# Patient Record
Sex: Female | Born: 1980 | Race: Black or African American | Hispanic: No | Smoking: Never smoker
Health system: Southern US, Community
[De-identification: ages and names within clinical notes are randomized; demographics above are authoritative.]

## PROBLEM LIST (undated history)

## (undated) DIAGNOSIS — N200 Calculus of kidney: Secondary | ICD-10-CM

## (undated) DIAGNOSIS — Z87442 Personal history of urinary calculi: Secondary | ICD-10-CM

## (undated) DIAGNOSIS — K5732 Diverticulitis of large intestine without perforation or abscess without bleeding: Secondary | ICD-10-CM

## (undated) DIAGNOSIS — I878 Other specified disorders of veins: Secondary | ICD-10-CM

## (undated) DIAGNOSIS — B9681 Helicobacter pylori [H. pylori] as the cause of diseases classified elsewhere: Secondary | ICD-10-CM

## (undated) DIAGNOSIS — M1990 Unspecified osteoarthritis, unspecified site: Secondary | ICD-10-CM

## (undated) DIAGNOSIS — K5790 Diverticulosis of intestine, part unspecified, without perforation or abscess without bleeding: Secondary | ICD-10-CM

## (undated) DIAGNOSIS — K2970 Gastritis, unspecified, without bleeding: Secondary | ICD-10-CM

## (undated) DIAGNOSIS — D509 Iron deficiency anemia, unspecified: Secondary | ICD-10-CM

## (undated) DIAGNOSIS — K219 Gastro-esophageal reflux disease without esophagitis: Secondary | ICD-10-CM

## (undated) HISTORY — DX: Helicobacter pylori (H. pylori) as the cause of diseases classified elsewhere: B96.81

## (undated) HISTORY — PX: SKIN LESION EXCISION: SHX2412

## (undated) HISTORY — PX: KNEE SURGERY: SHX244

## (undated) HISTORY — DX: Diverticulitis of large intestine without perforation or abscess without bleeding: K57.32

## (undated) HISTORY — DX: Other specified disorders of veins: I87.8

## (undated) HISTORY — DX: Gastritis, unspecified, without bleeding: K29.70

---

## 2001-10-13 ENCOUNTER — Emergency Department (HOSPITAL_COMMUNITY): Admission: EM | Admit: 2001-10-13 | Discharge: 2001-10-14 | Payer: Self-pay | Admitting: Emergency Medicine

## 2003-07-08 ENCOUNTER — Emergency Department (HOSPITAL_COMMUNITY): Admission: EM | Admit: 2003-07-08 | Discharge: 2003-07-09 | Payer: Self-pay | Admitting: *Deleted

## 2003-07-12 ENCOUNTER — Encounter (HOSPITAL_COMMUNITY): Admission: RE | Admit: 2003-07-12 | Discharge: 2003-08-11 | Payer: Self-pay | Admitting: Orthopedic Surgery

## 2005-10-03 ENCOUNTER — Emergency Department (HOSPITAL_COMMUNITY): Admission: EM | Admit: 2005-10-03 | Discharge: 2005-10-03 | Payer: Self-pay | Admitting: Emergency Medicine

## 2008-02-26 HISTORY — PX: OTHER SURGICAL HISTORY: SHX169

## 2008-05-10 ENCOUNTER — Inpatient Hospital Stay (HOSPITAL_COMMUNITY): Admission: AD | Admit: 2008-05-10 | Discharge: 2008-05-10 | Payer: Self-pay | Admitting: Family Medicine

## 2008-06-03 ENCOUNTER — Inpatient Hospital Stay (HOSPITAL_COMMUNITY): Admission: AD | Admit: 2008-06-03 | Discharge: 2008-06-03 | Payer: Self-pay | Admitting: Obstetrics and Gynecology

## 2008-06-12 ENCOUNTER — Emergency Department (HOSPITAL_COMMUNITY): Admission: EM | Admit: 2008-06-12 | Discharge: 2008-06-13 | Payer: Self-pay | Admitting: Emergency Medicine

## 2008-06-27 ENCOUNTER — Ambulatory Visit (HOSPITAL_COMMUNITY): Admission: RE | Admit: 2008-06-27 | Discharge: 2008-06-27 | Payer: Self-pay | Admitting: Orthopedic Surgery

## 2010-06-06 LAB — URINALYSIS, ROUTINE W REFLEX MICROSCOPIC
Bilirubin Urine: NEGATIVE
Glucose, UA: NEGATIVE mg/dL
Ketones, ur: 15 mg/dL — AB
Leukocytes, UA: NEGATIVE
Nitrite: NEGATIVE
Protein, ur: NEGATIVE mg/dL
Urobilinogen, UA: 0.2 mg/dL (ref 0.0–1.0)
pH: 5.5 (ref 5.0–8.0)
pH: 5 (ref 5.0–8.0)

## 2010-06-06 LAB — DIFFERENTIAL
Basophils Absolute: 0 10*3/uL (ref 0.0–0.1)
Basophils Relative: 1 % (ref 0–1)
Eosinophils Relative: 1 % (ref 0–5)
Lymphocytes Relative: 43 % (ref 12–46)
Lymphocytes Relative: 40 % (ref 12–46)
Monocytes Absolute: 0.9 10*3/uL (ref 0.1–1.0)
Monocytes Relative: 9 % (ref 3–12)
Neutro Abs: 4.2 10*3/uL (ref 1.7–7.7)
Neutro Abs: 4 10*3/uL (ref 1.7–7.7)
Neutrophils Relative %: 46 % (ref 43–77)
Neutrophils Relative %: 50 % (ref 43–77)

## 2010-06-06 LAB — CBC
HCT: 42.1 % (ref 36.0–46.0)
HCT: 44.8 % (ref 36.0–46.0)
HCT: 41.9 % (ref 36.0–46.0)
Hemoglobin: 14.4 g/dL (ref 12.0–15.0)
Hemoglobin: 14.2 g/dL (ref 12.0–15.0)
MCHC: 34.2 g/dL (ref 30.0–36.0)
Platelets: 186 10*3/uL (ref 150–400)
Platelets: 211 10*3/uL (ref 150–400)
RBC: 4.68 MIL/uL (ref 3.87–5.11)
RBC: 4.64 MIL/uL (ref 3.87–5.11)
RDW: 14.8 % (ref 11.5–15.5)
RDW: 14.9 % (ref 11.5–15.5)
WBC: 5.9 10*3/uL (ref 4.0–10.5)
WBC: 9.2 10*3/uL (ref 4.0–10.5)

## 2010-06-06 LAB — URINE MICROSCOPIC-ADD ON

## 2010-06-06 LAB — COMPREHENSIVE METABOLIC PANEL
Alkaline Phosphatase: 64 U/L (ref 39–117)
CO2: 25 mEq/L (ref 19–32)
Calcium: 9.2 mg/dL (ref 8.4–10.5)
Chloride: 106 mEq/L (ref 96–112)
Creatinine, Ser: 0.9 mg/dL (ref 0.4–1.2)
GFR calc non Af Amer: >60 mL/min (ref >60)
Potassium: 3.8 mEq/L (ref 3.5–5.1)
Total Bilirubin: 0.6 mg/dL (ref 0.3–1.2)
Total Protein: 7.2 g/dL (ref 6.0–8.3)

## 2010-06-06 LAB — POCT PREGNANCY, URINE: Preg Test, Ur: NEGATIVE

## 2010-06-06 LAB — GC/CHLAMYDIA PROBE AMP, GENITAL: GC Probe Amp, Genital: NEGATIVE

## 2010-06-06 LAB — POCT CARDIAC MARKERS: CKMB, poc: <1 ng/mL — ABNORMAL LOW (ref 1.0–8.0)

## 2010-06-06 LAB — WET PREP, GENITAL

## 2010-06-07 LAB — URINALYSIS, ROUTINE W REFLEX MICROSCOPIC
Glucose, UA: NEGATIVE mg/dL
Nitrite: NEGATIVE
Specific Gravity, Urine: 1.025 (ref 1.005–1.030)
Urobilinogen, UA: 0.2 mg/dL (ref 0.0–1.0)

## 2010-06-07 LAB — URINE MICROSCOPIC-ADD ON

## 2010-06-07 LAB — POCT PREGNANCY, URINE: Preg Test, Ur: NEGATIVE

## 2010-07-10 NOTE — Op Note (Signed)
Marilyn Rivera, Marilyn Rivera               ACCOUNT NO.:  [id number]   MEDICAL RECORD NO.:  [id number]          PATIENT TYPE:  AMB   LOCATION:  SDS                          FACILITY:  [facility]   PHYSICIAN:  Harvie Junior, M.D.   DATE OF BIRTH:  01-Jun-1980   DATE OF PROCEDURE:  06/27/2008  DATE OF DISCHARGE:  06/27/2008                               OPERATIVE REPORT   PREOPERATIVE DIAGNOSIS:  Lateral meniscal tear.   POSTOPERATIVE DIAGNOSES:  1. Lateral meniscal tear.  2. 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   SURGEON:  Harvie Junior, MD   ANESTHESIA:  General.   BRIEF HISTORY:  Marilyn Rivera is a young woman with a long history of  having had severe knee pain.  We treated her conservatively for period  of time.  Because of continued complaints of pain, she was ultimately  evaluated and felt to have lateral meniscal tear.  We talked about  treatment options and ultimately felt that operative intervention and  most appropriate course of action.  She was brought to the operating  room for this procedure.   PROCEDURE:  The patient was brought to the operating room.  After  adequate anesthesia was obtained with general anesthetic, the patient  was placed supine on the operating table.  The leg was then prepped and  draped in usual sterile fashion.  Following this, routine arthroscopic  examination of the knee revealed there was an obvious and complex  lateral meniscal tear.  This is debrided back to smooth and stable rim.  The lateral femoral condyle showed some grade 3 and grade 4 changes,  which was debrided.  Attention was turned up to the patellofemoral  joint, which had significant chondromalacia, which was debrided.  The  ACL was normal, medial side normal.  The knee was copiously and  thoroughly irrigated with a normal saline irrigation and suctioned dry.  The arthroscopic portals were closed with bandage.  Sterile compression  dressing was  applied.  The patient was taken to the recovery room and  was noted to be in satisfactory condition.  Estimated blood loss for  this procedure was none.      Harvie Junior, M.D.  Electronically Signed     Harvie Junior, M.D.  Electronically Signed    JLG/MEDQ  D:  07/20/2008  T:  07/21/2008  Job:  [id number]

## 2012-10-07 ENCOUNTER — Emergency Department (HOSPITAL_COMMUNITY): Payer: Self-pay

## 2012-10-07 ENCOUNTER — Emergency Department (HOSPITAL_COMMUNITY)
Admission: EM | Admit: 2012-10-07 | Discharge: 2012-10-07 | Disposition: A | Discharge status: Home / Self Care | Payer: Self-pay | Attending: Emergency Medicine | Admitting: Emergency Medicine

## 2012-10-07 ENCOUNTER — Encounter (HOSPITAL_COMMUNITY): Payer: Self-pay

## 2012-10-07 DIAGNOSIS — R5381 Other malaise: Secondary | ICD-10-CM | POA: Insufficient documentation

## 2012-10-07 DIAGNOSIS — R072 Precordial pain: Secondary | ICD-10-CM | POA: Insufficient documentation

## 2012-10-07 DIAGNOSIS — R002 Palpitations: Secondary | ICD-10-CM | POA: Insufficient documentation

## 2012-10-07 DIAGNOSIS — R110 Nausea: Secondary | ICD-10-CM | POA: Insufficient documentation

## 2012-10-07 DIAGNOSIS — R0789 Other chest pain: Secondary | ICD-10-CM

## 2012-10-07 LAB — CBC WITH DIFFERENTIAL/PLATELET
Basophils Absolute: 0 10*3/uL (ref 0.0–0.1)
Basophils Relative: 1 % (ref 0–1)
Eosinophils Absolute: 0.2 10*3/uL (ref 0.0–0.7)
Eosinophils Relative: 2 % (ref 0–5)
HCT: 41.5 % (ref 36.0–46.0)
MCH: 30.4 pg (ref 26.0–34.0)
MCHC: 33.7 g/dL (ref 30.0–36.0)
MCV: 90.2 fL (ref 78.0–100.0)
Monocytes Absolute: 0.6 10*3/uL (ref 0.1–1.0)
Platelets: 206 10*3/uL (ref 150–400)
RDW: 14.2 % (ref 11.5–15.5)

## 2012-10-07 LAB — BASIC METABOLIC PANEL
BUN: 10 mg/dL (ref 6–23)
CO2: 27 mEq/L (ref 19–32)
Calcium: 9.1 mg/dL (ref 8.4–10.5)
Chloride: 103 mEq/L (ref 96–112)
Creatinine, Ser: 0.78 mg/dL (ref 0.50–1.10)

## 2012-10-07 LAB — TROPONIN I: Troponin I: <0.3 ng/mL (ref <0.30)

## 2012-10-07 NOTE — ED Provider Notes (Signed)
CSN: [id number]     Arrival date & time 10/07/12  1221 History     First MD Initiated Contact with Patient 10/07/12 1259     Chief Complaint  Patient presents with  . Chest Pain  . Fatigue   (Consider location/radiation/quality/duration/timing/severity/associated sxs/prior Treatment) Patient is a 32 y.o. female presenting with chest pain. The history is provided by the patient. No language interpreter was used.  Chest Pain Pain location:  Substernal area Pain quality: pressure and sharp   Pain radiates to:  L arm Pain radiates to the back: no   Pain severity:  Moderate Onset quality:  Sudden Duration:  3 minutes Timing:  Intermittent Progression:  Unchanged Chronicity:  New Context: at rest   Context: not breathing, no drug use, no movement and not raising an arm   Relieved by:  Nothing Worsened by:  Nothing tried Ineffective treatments:  None tried Associated symptoms: fatigue and nausea   Associated symptoms: no abdominal pain, no anxiety, no back pain, no cough, no diaphoresis, no fever, no headache, no lower extremity edema, no numbness, no palpitations, no shortness of breath, no syncope, not vomiting and no weakness   Associated symptoms comment:  Palpitations Risk factors: obesity   Risk factors: no birth control, no coronary artery disease, no diabetes mellitus, no high cholesterol, no prior DVT/PE and no smoking     History reviewed. No pertinent past medical history. Past Surgical History  Procedure Laterality Date  . Knee surgery     No family history on file. History  Substance Use Topics  . Smoking status: Never Smoker   . Smokeless tobacco: Not on file  . Alcohol Use: No   OB History   Grav Para Term Preterm Abortions TAB SAB Ect Mult Living                 Review of Systems  Constitutional: Positive for fatigue. Negative for fever, chills, diaphoresis, activity change and appetite change.  HENT: Negative for congestion, sore throat, facial  swelling, rhinorrhea, neck pain and neck stiffness.   Eyes: Negative for photophobia and discharge.  Respiratory: Negative for cough, chest tightness and shortness of breath.   Cardiovascular: Positive for chest pain. Negative for palpitations, leg swelling and syncope.  Gastrointestinal: Positive for nausea. Negative for vomiting, abdominal pain and diarrhea.  Endocrine: Negative for polydipsia and polyuria.  Genitourinary: Negative for dysuria, frequency, difficulty urinating and pelvic pain.  Musculoskeletal: Negative for back pain and arthralgias.  Skin: Negative for color change and wound.  Allergic/Immunologic: Negative for immunocompromised state.  Neurological: Negative for facial asymmetry, weakness, numbness and headaches.  Hematological: Does not bruise/bleed easily.  Psychiatric/Behavioral: Negative for confusion and agitation.    Allergies  Review of patient's allergies indicates no known allergies.  Home Medications   Current Outpatient Rx  Name  Route  Sig  Dispense  Refill  . ibuprofen (ADVIL,MOTRIN) 200 MG tablet   Oral   Take 800 mg by mouth every 6 (six) hours as needed for pain.          BP 112/72  Pulse 68  Temp(Src) 98.3 F (36.8 C) (Oral)  Resp 19  Ht 5' 9.5" (1.765 m)  Wt 333 lb (151.048 kg)  BMI 48.49 kg/m2  SpO2 97%  LMP 10/01/2012 Physical Exam  Constitutional: She is oriented to person, place, and time. She appears well-developed and well-nourished. No distress.  Morbid obesity  HENT:  Head: Normocephalic and atraumatic.  Mouth/Throat: No oropharyngeal exudate.  Eyes: Pupils  are equal, round, and reactive to light.  Neck: Normal range of motion. Neck supple.  Cardiovascular: Normal rate, regular rhythm and normal heart sounds.  Exam reveals no gallop and no friction rub.   No murmur heard. Pulmonary/Chest: Effort normal and breath sounds normal. No respiratory distress. She has no wheezes. She has no rales.  Abdominal: Soft. Bowel sounds  are normal. She exhibits no distension and no mass. There is no tenderness. There is no rebound and no guarding.  Musculoskeletal: Normal range of motion. She exhibits no edema and no tenderness.  Neurological: She is alert and oriented to person, place, and time.  Skin: Skin is warm and dry.  Three small, superficial nodules, one under L sided skin fold and posterior neck w/o overlying erythema, induration, one under R sided skin fold with small smt of drainage, but no underlying fluctuance or induration.  Acanthosis nigrans  Psychiatric: She has a normal mood and affect.    ED Course   Procedures (including critical care time)  Labs Reviewed  BASIC METABOLIC PANEL - Abnormal; Notable for the following:    Glucose, Bld 102 (*)    All other components within normal limits  CBC WITH DIFFERENTIAL - Abnormal; Notable for the following:    Neutrophils Relative % 39 (*)    Lymphocytes Relative 50 (*)    All other components within normal limits  TROPONIN I  PREGNANCY, URINE  D-DIMER, QUANTITATIVE   Dg Chest 2 View  10/07/2012   *RADIOLOGY REPORT*  Clinical Data: Chest pain  CHEST - 2 VIEW  Comparison: June 12, 2008  Findings: Lungs clear.  Heart size and pulmonary vascularity are normal.  No pneumothorax.  No adenopathy.  No bone lesions.  IMPRESSION: No abnormality noted.   Original Report Authenticated By: Bretta Bang, M.D.   1. Atypical chest pain      Date: 10/07/2012  Rate: 76  Rhythm: normal sinus rhythm  QRS Axis: normal  Intervals: normal  ST/T Wave abnormalities: normal  Conduction Disutrbances:none  Narrative Interpretation:   Old EKG Reviewed: none available    MDM  Pt is a 32 y.o. female with Pmhx as above who presents with 2-3 weeks of intermittent mid sternal CP occuring at rest, lasting 2-3 mins.  No  Pt well appearing on PE, in NAD, PERC negative.  History is atypical for ischemia and no ST changes on EKG.     CXR unremarkable, trop negative, d-dimer not  elevated.  Hb nml, Cr stable.  I doubt cardiac cause of chest pain as well as PE, pna, ptx.  HEART score of 1 (low risk of adverse cardiac events).  Will ask pt to f/u as outpt with local PCP, but have also given return precautions for new or worsening symptoms such as more persistent pain, SOB, leg swelling. I do not believe any skin lesions noted above require I&D.    1. Atypical chest pain        Shanna Cisco, MD 10/07/12 1556

## 2012-10-07 NOTE — ED Notes (Signed)
Pt c/o feeling fatigued, chest pain since Saturday.  Reports Sunday had episode where she became diaphoretic, nauseated, and had left side pain.  Marilyn Rivera  Also reports a knot behind her naval.  Pt says has had multiple "boils."

## 2012-10-08 ENCOUNTER — Emergency Department (HOSPITAL_COMMUNITY)
Admission: EM | Admit: 2012-10-08 | Discharge: 2012-10-09 | Disposition: A | Discharge status: Home / Self Care | Payer: Self-pay | Attending: Emergency Medicine | Admitting: Emergency Medicine

## 2012-10-08 ENCOUNTER — Encounter (HOSPITAL_COMMUNITY): Payer: Self-pay

## 2012-10-08 DIAGNOSIS — R110 Nausea: Secondary | ICD-10-CM | POA: Insufficient documentation

## 2012-10-08 DIAGNOSIS — Z3202 Encounter for pregnancy test, result negative: Secondary | ICD-10-CM | POA: Insufficient documentation

## 2012-10-08 DIAGNOSIS — N390 Urinary tract infection, site not specified: Secondary | ICD-10-CM | POA: Insufficient documentation

## 2012-10-08 DIAGNOSIS — R109 Unspecified abdominal pain: Secondary | ICD-10-CM | POA: Insufficient documentation

## 2012-10-08 LAB — COMPREHENSIVE METABOLIC PANEL
AST: 18 U/L (ref 0–37)
CO2: 26 mEq/L (ref 19–32)
Calcium: 9.4 mg/dL (ref 8.4–10.5)
Creatinine, Ser: 0.93 mg/dL (ref 0.50–1.10)
GFR calc Af Amer: >90 mL/min (ref >90)
GFR calc non Af Amer: 80 mL/min — ABNORMAL LOW (ref >90)
Total Protein: 7 g/dL (ref 6.0–8.3)

## 2012-10-08 LAB — CBC WITH DIFFERENTIAL/PLATELET
Basophils Absolute: 0 10*3/uL (ref 0.0–0.1)
Eosinophils Absolute: 0.2 10*3/uL (ref 0.0–0.7)
Eosinophils Relative: 2 % (ref 0–5)
HCT: 42.2 % (ref 36.0–46.0)
Lymphocytes Relative: 51 % — ABNORMAL HIGH (ref 12–46)
MCH: 29.7 pg (ref 26.0–34.0)
MCHC: 32.9 g/dL (ref 30.0–36.0)
MCV: 90.2 fL (ref 78.0–100.0)
Monocytes Absolute: 0.7 10*3/uL (ref 0.1–1.0)
RDW: 14.3 % (ref 11.5–15.5)
WBC: 8.7 10*3/uL (ref 4.0–10.5)

## 2012-10-08 MED ORDER — ONDANSETRON 8 MG PO TBDP
8.0000 mg | ORAL_TABLET | Freq: Once | ORAL | Status: AC
  Administered 2012-10-08: 8 mg via ORAL
  Filled 2012-10-08: qty 1

## 2012-10-08 MED ORDER — ONDANSETRON HCL 4 MG/2ML IJ SOLN
4.0000 mg | Freq: Once | INTRAMUSCULAR | Status: AC
  Administered 2012-10-08: 4 mg via INTRAVENOUS
  Filled 2012-10-08: qty 2

## 2012-10-08 MED ORDER — MORPHINE SULFATE 4 MG/ML IJ SOLN
4.0000 mg | Freq: Once | INTRAMUSCULAR | Status: AC
  Administered 2012-10-08: 4 mg via INTRAVENOUS
  Filled 2012-10-08: qty 1

## 2012-10-08 NOTE — ED Notes (Signed)
Seen here yesterday and dx'd with umbilical hernia. Pain worse today with mild nausea. No vomiting or diarrhea. Last BM this morning

## 2012-10-08 NOTE — ED Notes (Signed)
Patient ambulatory to restroom with steady gait, with tech and husband, clean catch instructions given and advised pt to bring specimen back to room as well.

## 2012-10-09 ENCOUNTER — Emergency Department (HOSPITAL_COMMUNITY): Payer: Self-pay

## 2012-10-09 LAB — URINALYSIS, ROUTINE W REFLEX MICROSCOPIC
Nitrite: NEGATIVE
Specific Gravity, Urine: >1.03 — ABNORMAL HIGH (ref 1.005–1.030)
Urobilinogen, UA: 0.2 mg/dL (ref 0.0–1.0)

## 2012-10-09 LAB — LIPASE, BLOOD: Lipase: 27 U/L (ref 11–59)

## 2012-10-09 LAB — POCT PREGNANCY, URINE: Preg Test, Ur: NEGATIVE

## 2012-10-09 MED ORDER — CEPHALEXIN 500 MG PO CAPS: 500.0000 mg | ORAL_CAPSULE | Freq: Four times a day (QID) | ORAL | Status: DC

## 2012-10-09 NOTE — ED Notes (Signed)
Patient given discharge instruction, verbalized understand. IV removed, band aid applied. Patient ambulatory out of the department with family 

## 2012-10-09 NOTE — ED Provider Notes (Signed)
CSN: [id number]     Arrival date & time 10/08/12  2140 History     First MD Initiated Contact with Patient 10/08/12 2224     Chief Complaint  Patient presents with  . Abdominal Pain   (Consider location/radiation/quality/duration/timing/severity/associated sxs/prior Treatment) HPI Comments: KATIYA FIKE is [name] presenting with a 3 day history of abdominal pain which has been waxing and waning and sharp in character which worsened tonight.  She was seen here yesterday for similar complaint which also included midsternal chest pain which has not been present today and had a negative cardiac workup at yesterdays visit, but it was suggested she may have an umbilical hernia as she notices an occasional "knot" at her umbilicus.  She denies fever, chills and vomiting but has had some intermittent nausea.  Her last bm was yesterday and normal.  She denies urinary pain or increased frequency and denies vaginal discharge or complaint.  She is married with no risk factors for stds. She hast taken ibuprofen without relief of pain.  Food does not effect her pain, her last meal was 3 hours before arrival.    The history is provided by the patient.    History reviewed. No pertinent past medical history. Past Surgical History  Procedure Laterality Date  . Knee surgery    . Renal calculi removal Left 2010   No family history on file. History  Substance Use Topics  . Smoking status: Never Smoker   . Smokeless tobacco: Not on file  . Alcohol Use: No   OB History   Grav Para Term Preterm Abortions TAB SAB Ect Mult Living                 Review of Systems  Constitutional: Negative for fever, chills and appetite change.  HENT: Negative for congestion, sore throat and neck pain.   Eyes: Negative.   Respiratory: Negative for chest tightness and shortness of breath.   Cardiovascular: Negative for chest pain.  Gastrointestinal: Positive for nausea and abdominal pain. Negative for  vomiting, diarrhea and constipation.  Genitourinary: Negative.   Musculoskeletal: Negative for joint swelling and arthralgias.  Skin: Negative.  Negative for rash and wound.  Neurological: Negative for dizziness, weakness, light-headedness, numbness and headaches.  Psychiatric/Behavioral: Negative.     Allergies  Review of patient's allergies indicates no known allergies.  Home Medications   Current Outpatient Rx  Name  Route  Sig  Dispense  Refill  . ibuprofen (ADVIL,MOTRIN) 800 MG tablet   Oral   Take 800 mg by mouth every 8 (eight) hours as needed for pain.         . cephALEXin (KEFLEX) 500 MG capsule   Oral   Take 1 capsule (500 mg total) by mouth 4 (four) times daily.   28 capsule   0    BP 127/55  Pulse 71  Temp(Src) 98.7 F (37.1 C) (Oral)  Resp 18  Ht 5\' 9"  (1.753 m)  Wt 333 lb (151.048 kg)  BMI 49.15 kg/m2  SpO2 94%  LMP 10/01/2012 Physical Exam  Nursing note and vitals reviewed. Constitutional: She appears well-developed.  Morbid obesity  HENT:  Head: Normocephalic and atraumatic.  Mouth/Throat: Oropharynx is clear and moist.  Eyes: Conjunctivae are normal.  Neck: Normal range of motion.  Cardiovascular: Normal rate, regular rhythm, normal heart sounds and intact distal pulses.   Pulmonary/Chest: Effort normal and breath sounds normal. She has no wheezes.  Abdominal: Soft. Bowel sounds are normal. There  is no hepatosplenomegaly. There is tenderness in the right lower quadrant and epigastric area. There is no rebound, no guarding, no CVA tenderness and negative Murphy's sign.  Abdominal exam is limited by body habitus.  Musculoskeletal: Normal range of motion.  Neurological: She is alert.  Skin: Skin is warm and dry.  Psychiatric: She has a normal mood and affect.    ED Course   Procedures (including critical care time)  Labs Reviewed  CBC WITH DIFFERENTIAL - Abnormal; Notable for the following:    Neutrophils Relative % 39 (*)    Lymphocytes  Relative 51 (*)    Lymphs Abs 4.4 (*)    All other components within normal limits  COMPREHENSIVE METABOLIC PANEL - Abnormal; Notable for the following:    Glucose, Bld 104 (*)    Albumin 3.4 (*)    Total Bilirubin 0.2 (*)    GFR calc non Af Amer 80 (*)    All other components within normal limits  URINALYSIS, ROUTINE W REFLEX MICROSCOPIC - Abnormal; Notable for the following:    APPearance HAZY (*)    Specific Gravity, Urine >1.030 (*)    Hgb urine dipstick MODERATE (*)    All other components within normal limits  URINE MICROSCOPIC-ADD ON - Abnormal; Notable for the following:    Squamous Epithelial / LPF FEW (*)    Bacteria, UA MANY (*)    All other components within normal limits  URINE CULTURE  LIPASE, BLOOD  POCT PREGNANCY, URINE   Ct Abdomen Pelvis Wo Contrast  10/09/2012   *RADIOLOGY REPORT*  Clinical Data: Periumbilical pain and right lower quadrant pain.  CT ABDOMEN AND PELVIS WITHOUT CONTRAST  Technique:  Multidetector CT imaging of the abdomen and pelvis was performed following the standard protocol without intravenous contrast.  Comparison: CT of the abdomen and pelvis 10/18/2009.  Findings:  Lung Bases: Minimal dependent atelectasis in the lower lobes of the lungs bilaterally.  Abdomen/Pelvis:  There are no abnormal calcifications within the collecting system of either kidney, along the course of either ureter, or within the lumen of the urinary bladder.  No hydroureteronephrosis or perinephric stranding to suggest urinary tract obstruction at this time.  The unenhanced appearance of the kidneys is unremarkable bilaterally.  Diffuse low attenuation throughout the hepatic parenchyma, compatible with hepatic steatosis.  No focal hepatic lesions are noted on today's noncontrast CT examination.  The unenhanced appearance of the gallbladder, pancreas, spleen and bilateral adrenal glands is unremarkable.  No significant volume of ascites.  No pneumoperitoneum.  No pathologic distension  of small bowel. Numerous colonic diverticula are noted, without surrounding inflammatory changes to suggest acute diverticulitis at this time. Normal appendix.  The uterus and ovaries are unremarkable in appearance.  Urinary bladder is normal in appearance.  Musculoskeletal: There are no aggressive appearing lytic or blastic lesions noted in the visualized portions of the skeleton.  IMPRESSION: 1.  No acute findings in the abdomen or pelvis to account for the patient's symptoms. 2.  Specifically, the appendix is normal. 3.  Mild colonic diverticulosis without findings to suggest acute diverticulitis at this time. 4.  Hepatic steatosis.   Original Report Authenticated By: Trudie Reed, M.D.   Dg Chest 2 View  10/07/2012   *RADIOLOGY REPORT*  Clinical Data: Chest pain  CHEST - 2 VIEW  Comparison: June 12, 2008  Findings: Lungs clear.  Heart size and pulmonary vascularity are normal.  No pneumothorax.  No adenopathy.  No bone lesions.  IMPRESSION: No abnormality noted.  Original Report Authenticated By: Bretta Bang, M.D.   1. Abdominal pain   2. UTI (lower urinary tract infection)     MDM  Abdominal pain of unclear etiology, but lab findings suggestive of uti.  Pt was placed on keflex,  Encouraged increased fluid intake.  Urine cx pending.  Referrals given for establishing pcp, advised return here for any worsened sx.  The patient appears reasonably screened and/or stabilized for discharge and I doubt any other medical condition or other [facility] requiring further screening, evaluation, or treatment in the ED at this time prior to discharge.  Patients labs and/or radiological studies were viewed and considered during the medical decision making and disposition process.   Burgess Amor, PA-C 10/09/12 0221

## 2012-10-09 NOTE — ED Provider Notes (Signed)
Medical screening examination/treatment/procedure(s) were performed by non-physician practitioner and as supervising physician I was immediately available for consultation/collaboration.   Dione Booze, MD 10/09/12 [phone]

## 2012-10-10 LAB — URINE CULTURE

## 2013-03-15 ENCOUNTER — Encounter (HOSPITAL_BASED_OUTPATIENT_CLINIC_OR_DEPARTMENT_OTHER): Payer: Self-pay | Admitting: Emergency Medicine

## 2013-03-15 ENCOUNTER — Emergency Department (HOSPITAL_BASED_OUTPATIENT_CLINIC_OR_DEPARTMENT_OTHER)
Admission: EM | Admit: 2013-03-15 | Discharge: 2013-03-15 | Disposition: A | Discharge status: Home / Self Care | Payer: Self-pay | Attending: Emergency Medicine | Admitting: Emergency Medicine

## 2013-03-15 DIAGNOSIS — N938 Other specified abnormal uterine and vaginal bleeding: Secondary | ICD-10-CM | POA: Insufficient documentation

## 2013-03-15 DIAGNOSIS — B3789 Other sites of candidiasis: Secondary | ICD-10-CM | POA: Insufficient documentation

## 2013-03-15 DIAGNOSIS — N949 Unspecified condition associated with female genital organs and menstrual cycle: Secondary | ICD-10-CM | POA: Insufficient documentation

## 2013-03-15 DIAGNOSIS — Z792 Long term (current) use of antibiotics: Secondary | ICD-10-CM | POA: Insufficient documentation

## 2013-03-15 DIAGNOSIS — B372 Candidiasis of skin and nail: Secondary | ICD-10-CM

## 2013-03-15 MED ORDER — NYSTATIN 100000 UNIT/GM EX POWD: 1.0000 g | Freq: Three times a day (TID) | CUTANEOUS | Status: DC

## 2013-03-15 NOTE — Discharge Instructions (Signed)
Cutaneous Candidiasis Cutaneous candidiasis is a condition in which there is an overgrowth of yeast (candida) on the skin. Yeast normally live on the skin, but in small enough numbers not to cause any symptoms. In certain cases, increased growth of the yeast may cause an actual yeast infection. This kind of infection usually occurs in areas of the skin that are constantly warm and moist, such as the armpits or the groin. Yeast is the most common cause of diaper rash in babies and in people who cannot control their bowel movements (incontinence). CAUSES  The fungus that most often causes cutaneous candidiasis is Candida albicans. Conditions that can increase the risk of getting a yeast infection of the skin include:  Obesity.  Pregnancy.  Diabetes.  Taking antibiotic medicine.  Taking birth control pills.  Taking steroid medicines.  Thyroid disease.  An iron or zinc deficiency.  Problems with the immune system. SYMPTOMS   Red, swollen area of the skin.  Bumps on the skin.  Itchiness. DIAGNOSIS  The diagnosis of cutaneous candidiasis is usually based on its appearance. Light scrapings of the skin may also be taken and viewed under a microscope to identify the presence of yeast. TREATMENT  Antifungal creams may be applied to the infected skin. In severe cases, oral medicines may be needed.  HOME CARE INSTRUCTIONS   Keep your skin clean and dry.  Maintain a healthy weight.  If you have diabetes, keep your blood sugar under control. SEEK IMMEDIATE MEDICAL CARE IF:  Your rash continues to spread despite treatment.  You have a fever, chills, or abdominal pain. Document Released: 10/30/2010 Document Revised: 05/06/2011 Document Reviewed: 10/30/2010 [employer] Patient Information 2014 [employer].  

## 2013-03-15 NOTE — ED Provider Notes (Signed)
CSN: [id number]     Arrival date & time 03/15/13  1432 History  This chart was scribed for Neta Ehlers, MD by Zettie Pho, ED Scribe. This patient was seen in room MH12/MH12 and the patient's care was started at 3:54 PM.    Chief Complaint  Patient presents with  . Rash   Patient is a 33 y.o. female presenting with rash. The history is provided by the patient. No language interpreter was used.  Rash Location:  Torso and ano-genital Torso rash location:  Abd LUQ, abd LLQ, abd RUQ, abd RLQ, lower back, L chest and R chest Ano-genital rash location:  Groin, vagina, L buttock and R buttock Quality: burning, draining and itchiness   Severity:  Moderate Onset quality:  Gradual Duration:  2 months Timing:  Constant Progression:  Worsening Chronicity:  New Context: exposure to similar rash (possibly)   Context: not new detergent/soap   Ineffective treatments:  Anti-itch cream Associated symptoms: no abdominal pain, no diarrhea, no fatigue, no fever, no headaches, no joint pain, no nausea, no shortness of breath, no sore throat and not vomiting    HPI Comments: KEEYA DYCKMAN is a 33 y.o. female who presents to the Emergency Department complaining of an itching, burning rash diffusely over her abdomen, chest (just beneath and around the breasts), lower back/buttocks, groin, and vagina onset 1.5-2 months ago. She states the rash began on the right-side of her abdomen and spread to the other areas. Patient states that the rash appears worse around her groin/vaginal area, which has been draining clear fluid. She reports applying OTC ointment and anti-itch cream to the areas with temporary relief, but that the rash has been spreading and progressively worsening. She states this type of rash is new for her. She states that she has been exposed to individuals with rashes, but that they do not appear similar. She denies any changes in at-home products. Patient states that she does not currently have a  PCP. Patient has no other pertinent medical history.   Patient also reports that her current menstrual period has been much heavier than usual and has been almost constant for the past 2 months, with only about 2 days between bleeding.   History reviewed. No pertinent past medical history. Past Surgical History  Procedure Laterality Date  . Knee surgery    . Renal calculi removal Left 2010   No family history on file. History  Substance Use Topics  . Smoking status: Never Smoker   . Smokeless tobacco: Not on file  . Alcohol Use: No   OB History   Grav Para Term Preterm Abortions TAB SAB Ect Mult Living                 Review of Systems  Constitutional: Negative for fever, chills, diaphoresis, activity change, appetite change and fatigue.  HENT: Negative for congestion, facial swelling, rhinorrhea and sore throat.   Eyes: Negative for photophobia and discharge.  Respiratory: Negative for cough, chest tightness and shortness of breath.   Cardiovascular: Negative for chest pain, palpitations and leg swelling.  Gastrointestinal: Negative for nausea, vomiting, abdominal pain and diarrhea.  Endocrine: Negative for polydipsia and polyuria.  Genitourinary: Positive for menstrual problem. Negative for dysuria, frequency, difficulty urinating and pelvic pain.  Musculoskeletal: Negative for arthralgias, back pain, neck pain and neck stiffness.  Skin: Positive for rash. Negative for color change and wound.  Allergic/Immunologic: Negative for immunocompromised state.  Neurological: Negative for facial asymmetry, weakness, numbness and headaches.  Hematological: Does not bruise/bleed easily.  Psychiatric/Behavioral: Negative for confusion and agitation.    Allergies  Review of patient's allergies indicates no known allergies.  Home Medications   Current Outpatient Rx  Name  Route  Sig  Dispense  Refill  . cephALEXin (KEFLEX) 500 MG capsule   Oral   Take 1 capsule (500 mg total) by  mouth 4 (four) times daily.   28 capsule   0   . ibuprofen (ADVIL,MOTRIN) 800 MG tablet   Oral   Take 800 mg by mouth every 8 (eight) hours as needed for pain.         Marland Kitchen nystatin (MYCOSTATIN/NYSTOP) 100000 UNIT/GM POWD   Topical   Apply 1 g topically 3 (three) times daily. apply to candidal lesions TOPICALLY 2 to 3 times daily until healing complete   60 g   0    Triage Vitals: BP 155/81  Pulse 77  Temp(Src) 98.1 F (36.7 C) (Oral)  Resp 20  Ht 5' 9.5" (1.765 m)  Wt 333 lb (151.048 kg)  BMI 48.49 kg/m2  SpO2 100%  LMP 02/12/2013  Physical Exam  Nursing note and vitals reviewed. Constitutional: She is oriented to person, place, and time. She appears well-developed and well-nourished. No distress.  HENT:  Head: Normocephalic.  Mouth/Throat: Oropharynx is clear and moist.  Eyes: Pupils are equal, round, and reactive to light.  Neck: Neck supple.  Cardiovascular: Normal rate, regular rhythm and normal heart sounds.   Pulmonary/Chest: Effort normal and breath sounds normal. No respiratory distress. She has no wheezes.  Abdominal: Soft. She exhibits no distension. There is no tenderness. There is no rebound and no guarding.  Musculoskeletal: She exhibits no edema and no tenderness.  Neurological: She is alert and oriented to person, place, and time.  Skin: Skin is warm and dry. Rash noted.  Erythema and dermal thinning in skin folds of panis, groin, breasts with a clear exudate on groin.   Psychiatric: She has a normal mood and affect.    ED Course  Procedures (including critical care time)  DIAGNOSTIC STUDIES: Oxygen Saturation is 100% on room air, normal by my interpretation.    COORDINATION OF CARE: 4:00 PM- Will discharge patient with Nystatin powder to manage symptoms. Advised patient to follow up with the referred PCP, especially if symptoms do not improve in 1-2 weeks. Advised patient to follow up with her OB/GYN for her menstrual problem. Discussed treatment  plan with patient at bedside and patient verbalized agreement.     Labs Review Labs Reviewed - No data to display Imaging Review No results found.  EKG Interpretation   None       MDM   1. Candidal skin infection    Pt is a 33 y.o. female with Pmhx as above who is morbidly obese who presents with itching rash in skin folds for about 1-2 months. She appears otherwise systemically well.  I believe she has developed candidal rash in skin folds.  Will start trial of nystatin powder.  Rec pt dry to keep areas as dry as possible, establish with [facility] for establishing close f/u.  Return precautions given for new or worsening symptoms including worsening pain, fever.      I personally performed the services described in this documentation, which was scribed in my presence. The recorded information has been reviewed and is accurate.     Neta Ehlers, MD 03/16/13 1120

## 2013-03-15 NOTE — ED Notes (Signed)
States she has a rash around her abdomen, her groin and her vagina x 2 months.

## 2013-03-15 NOTE — ED Notes (Signed)
Pt reports she has had a rash under breasts, abdomen and under panus x 1 month.  She has been using OTC "ointments" without relief

## 2014-04-27 ENCOUNTER — Encounter (HOSPITAL_COMMUNITY): Payer: Self-pay | Admitting: Emergency Medicine

## 2014-04-27 ENCOUNTER — Inpatient Hospital Stay (HOSPITAL_COMMUNITY)
Admission: EM | Admit: 2014-04-27 | Discharge: 2014-05-01 | DRG: 392 | Disposition: A | Discharge status: Home / Self Care | Payer: Self-pay | Attending: Internal Medicine | Admitting: Internal Medicine

## 2014-04-27 ENCOUNTER — Emergency Department (HOSPITAL_COMMUNITY): Payer: Self-pay

## 2014-04-27 DIAGNOSIS — R102 Pelvic and perineal pain: Secondary | ICD-10-CM

## 2014-04-27 DIAGNOSIS — K5732 Diverticulitis of large intestine without perforation or abscess without bleeding: Principal | ICD-10-CM | POA: Diagnosis present

## 2014-04-27 DIAGNOSIS — R109 Unspecified abdominal pain: Secondary | ICD-10-CM

## 2014-04-27 DIAGNOSIS — Z791 Long term (current) use of non-steroidal anti-inflammatories (NSAID): Secondary | ICD-10-CM

## 2014-04-27 DIAGNOSIS — D649 Anemia, unspecified: Secondary | ICD-10-CM | POA: Diagnosis present

## 2014-04-27 DIAGNOSIS — Z833 Family history of diabetes mellitus: Secondary | ICD-10-CM

## 2014-04-27 DIAGNOSIS — Z6841 Body Mass Index (BMI) 40.0 and over, adult: Secondary | ICD-10-CM

## 2014-04-27 DIAGNOSIS — Z806 Family history of leukemia: Secondary | ICD-10-CM

## 2014-04-27 DIAGNOSIS — Z8249 Family history of ischemic heart disease and other diseases of the circulatory system: Secondary | ICD-10-CM

## 2014-04-27 DIAGNOSIS — Z87442 Personal history of urinary calculi: Secondary | ICD-10-CM

## 2014-04-27 DIAGNOSIS — R1032 Left lower quadrant pain: Secondary | ICD-10-CM

## 2014-04-27 DIAGNOSIS — D72829 Elevated white blood cell count, unspecified: Secondary | ICD-10-CM

## 2014-04-27 DIAGNOSIS — N390 Urinary tract infection, site not specified: Secondary | ICD-10-CM | POA: Diagnosis present

## 2014-04-27 DIAGNOSIS — K5900 Constipation, unspecified: Secondary | ICD-10-CM | POA: Diagnosis present

## 2014-04-27 DIAGNOSIS — K5792 Diverticulitis of intestine, part unspecified, without perforation or abscess without bleeding: Secondary | ICD-10-CM | POA: Diagnosis present

## 2014-04-27 HISTORY — DX: Calculus of kidney: N20.0

## 2014-04-27 LAB — URINALYSIS, ROUTINE W REFLEX MICROSCOPIC
BILIRUBIN URINE: NEGATIVE
GLUCOSE, UA: NEGATIVE mg/dL
KETONES UR: NEGATIVE mg/dL
Leukocytes, UA: NEGATIVE
Nitrite: NEGATIVE
PROTEIN: NEGATIVE mg/dL
Specific Gravity, Urine: 1.02 (ref 1.005–1.030)
Urobilinogen, UA: 0.2 mg/dL (ref 0.0–1.0)
pH: 7.5 (ref 5.0–8.0)

## 2014-04-27 LAB — BASIC METABOLIC PANEL
Anion gap: 6 (ref 5–15)
BUN: 17 mg/dL (ref 6–23)
CALCIUM: 8.6 mg/dL (ref 8.4–10.5)
CHLORIDE: 108 mmol/L (ref 96–112)
CO2: 23 mmol/L (ref 19–32)
CREATININE: 0.88 mg/dL (ref 0.50–1.10)
GFR calc non Af Amer: 85 mL/min — ABNORMAL LOW (ref >90)
Glucose, Bld: 101 mg/dL — ABNORMAL HIGH (ref 70–99)
Potassium: 4 mmol/L (ref 3.5–5.1)
Sodium: 137 mmol/L (ref 135–145)

## 2014-04-27 LAB — CBC WITH DIFFERENTIAL/PLATELET
BASOS ABS: 0 10*3/uL (ref 0.0–0.1)
BASOS PCT: 0 % (ref 0–1)
EOS ABS: 0 10*3/uL (ref 0.0–0.7)
EOS PCT: 0 % (ref 0–5)
HEMATOCRIT: 34.7 % — AB (ref 36.0–46.0)
HEMOGLOBIN: 11.1 g/dL — AB (ref 12.0–15.0)
Lymphocytes Relative: 28 % (ref 12–46)
Lymphs Abs: 3.1 10*3/uL (ref 0.7–4.0)
MCH: 27.4 pg (ref 26.0–34.0)
MCHC: 32 g/dL (ref 30.0–36.0)
MCV: 85.7 fL (ref 78.0–100.0)
Monocytes Absolute: 0.8 10*3/uL (ref 0.1–1.0)
Monocytes Relative: 8 % (ref 3–12)
Neutro Abs: 7 10*3/uL (ref 1.7–7.7)
Neutrophils Relative %: 64 % (ref 43–77)
PLATELETS: 265 10*3/uL (ref 150–400)
RBC: 4.05 MIL/uL (ref 3.87–5.11)
RDW: 14.3 % (ref 11.5–15.5)
WBC: 11 10*3/uL — ABNORMAL HIGH (ref 4.0–10.5)

## 2014-04-27 LAB — URINE MICROSCOPIC-ADD ON

## 2014-04-27 LAB — PREGNANCY, URINE: Preg Test, Ur: NEGATIVE

## 2014-04-27 LAB — TSH: TSH: 2.055 u[IU]/mL (ref 0.350–4.500)

## 2014-04-27 MED ORDER — SODIUM CHLORIDE 0.9 % IV SOLN: INTRAVENOUS | Status: AC

## 2014-04-27 MED ORDER — SODIUM CHLORIDE 0.45 % IV SOLN
INTRAVENOUS | Status: DC
  Administered 2014-04-27: 19:00:00 via INTRAVENOUS

## 2014-04-27 MED ORDER — IOHEXOL 300 MG/ML  SOLN
25.0000 mL | Freq: Once | INTRAMUSCULAR | Status: AC | PRN
  Administered 2014-04-27: 25 mL via ORAL

## 2014-04-27 MED ORDER — PHENAZOPYRIDINE HCL 100 MG PO TABS
100.0000 mg | ORAL_TABLET | Freq: Three times a day (TID) | ORAL | Status: DC
  Administered 2014-04-27: 100 mg via ORAL
  Administered 2014-04-28 (×3): 200 mg via ORAL
  Administered 2014-04-29 (×2): 100 mg via ORAL
  Filled 2014-04-27: qty 2
  Filled 2014-04-27: qty 1
  Filled 2014-04-27: qty 2
  Filled 2014-04-27: qty 1
  Filled 2014-04-27: qty 2
  Filled 2014-04-27: qty 1

## 2014-04-27 MED ORDER — ONDANSETRON HCL 4 MG PO TABS: 4.0000 mg | ORAL_TABLET | Freq: Four times a day (QID) | ORAL | Status: DC | PRN

## 2014-04-27 MED ORDER — CIPROFLOXACIN IN D5W 400 MG/200ML IV SOLN
400.0000 mg | Freq: Once | INTRAVENOUS | Status: DC
  Filled 2014-04-27: qty 200

## 2014-04-27 MED ORDER — MORPHINE SULFATE 4 MG/ML IJ SOLN
4.0000 mg | INTRAMUSCULAR | Status: DC | PRN
  Administered 2014-04-27 – 2014-05-01 (×16): 4 mg via INTRAVENOUS
  Filled 2014-04-27 (×17): qty 1

## 2014-04-27 MED ORDER — METRONIDAZOLE IN NACL 5-0.79 MG/ML-% IV SOLN
500.0000 mg | Freq: Once | INTRAVENOUS | Status: AC
  Administered 2014-04-27: 500 mg via INTRAVENOUS
  Filled 2014-04-27: qty 100

## 2014-04-27 MED ORDER — CIPROFLOXACIN IN D5W 400 MG/200ML IV SOLN
400.0000 mg | Freq: Two times a day (BID) | INTRAVENOUS | Status: DC
  Administered 2014-04-27 – 2014-04-30 (×6): 400 mg via INTRAVENOUS
  Filled 2014-04-27 (×5): qty 200

## 2014-04-27 MED ORDER — PHENAZOPYRIDINE HCL 100 MG PO TABS: 100.0000 mg | ORAL_TABLET | Freq: Three times a day (TID) | ORAL | Status: DC

## 2014-04-27 MED ORDER — FENTANYL CITRATE 0.05 MG/ML IJ SOLN
50.0000 ug | Freq: Once | INTRAMUSCULAR | Status: AC
  Administered 2014-04-27: 50 ug via INTRAVENOUS
  Filled 2014-04-27: qty 2

## 2014-04-27 MED ORDER — HEPARIN SODIUM (PORCINE) 5000 UNIT/ML IJ SOLN
5000.0000 [IU] | Freq: Three times a day (TID) | INTRAMUSCULAR | Status: DC
  Administered 2014-04-27 – 2014-05-01 (×10): 5000 [IU] via SUBCUTANEOUS
  Filled 2014-04-27 (×12): qty 1

## 2014-04-27 MED ORDER — HYDROMORPHONE HCL 1 MG/ML IJ SOLN
1.0000 mg | Freq: Once | INTRAMUSCULAR | Status: AC
  Administered 2014-04-27: 1 mg via INTRAVENOUS
  Filled 2014-04-27: qty 1

## 2014-04-27 MED ORDER — METRONIDAZOLE IN NACL 5-0.79 MG/ML-% IV SOLN
500.0000 mg | Freq: Three times a day (TID) | INTRAVENOUS | Status: DC
  Administered 2014-04-27 – 2014-04-30 (×8): 500 mg via INTRAVENOUS
  Filled 2014-04-27 (×8): qty 100

## 2014-04-27 MED ORDER — ACETAMINOPHEN 650 MG RE SUPP: 650.0000 mg | Freq: Four times a day (QID) | RECTAL | Status: DC | PRN

## 2014-04-27 MED ORDER — ACETAMINOPHEN 325 MG PO TABS: 650.0000 mg | ORAL_TABLET | Freq: Four times a day (QID) | ORAL | Status: DC | PRN

## 2014-04-27 MED ORDER — ONDANSETRON HCL 4 MG/2ML IJ SOLN
4.0000 mg | Freq: Once | INTRAMUSCULAR | Status: AC
  Administered 2014-04-27: 4 mg via INTRAMUSCULAR
  Filled 2014-04-27: qty 2

## 2014-04-27 MED ORDER — IOHEXOL 300 MG/ML  SOLN
100.0000 mL | Freq: Once | INTRAMUSCULAR | Status: AC | PRN
  Administered 2014-04-27: 100 mL via INTRAVENOUS

## 2014-04-27 MED ORDER — ONDANSETRON HCL 4 MG/2ML IJ SOLN
4.0000 mg | Freq: Four times a day (QID) | INTRAMUSCULAR | Status: DC | PRN
  Administered 2014-04-29 – 2014-05-01 (×2): 4 mg via INTRAVENOUS
  Filled 2014-04-27 (×2): qty 2

## 2014-04-27 NOTE — ED Notes (Signed)
Pt reports lower abd pain x 2 days.

## 2014-04-27 NOTE — H&P (Signed)
[facility] History and Physical  Marilyn Rivera HGD:[id number] DOB: Sep 07, 1980 DOA: 04/27/2014  Referring physician: Brunetta Genera - [facility] PCP: No PCP Per Patient   Chief Complaint: ABD pain  HPI: Marilyn Rivera is a 34 y.o. female  Abd pain. Started 2 days ago. Getting worse. Lower abdomen w/o radiation.constant. Stabbing pain. Daily soft BM. Associated w/ nausea.   Dysuria and frequency. Ongoing for 2 days. Voided 4x since coming to ED  Review of Systems:  Constitutional:  No weight loss, night sweats, Fevers, chills, fatigue.  HEENT:  No headaches, Difficulty swallowing,Tooth/dental problems,Sore throat,  No sneezing, itching, ear ache, nasal congestion, post nasal drip,  Cardio-vascular:  No chest pain, Orthopnea, PND, swelling in lower extremities, anasarca, dizziness, palpitations  GI:  Per HPI Resp:   No shortness of breath with exertion or at rest. No excess mucus, no productive cough, No non-productive cough, No coughing up of blood.No change in color of mucus.No wheezing.No chest wall deformity  Skin:  no rash or lesions.  GU:  Per HPI Musculoskeletal:   No joint pain or swelling. No decreased range of motion. No back pain.  Psych:  No change in mood or affect. No depression or anxiety. No memory loss.   Past Medical History  Diagnosis Date  . Kidney stones    Past Surgical History  Procedure Laterality Date  . Knee surgery    . Renal calculi removal Left 2010   Social History:  reports that she has never smoked. She has never used smokeless tobacco. She reports that she does not drink alcohol or use illicit drugs.  No Known Allergies  Family History  Problem Relation Age of Onset  . Heart failure Mother   . Cancer Mother   . Cancer Father   . Hypertension Father   . Diabetes Other   . Hypertension Other      Prior to Admission medications   Medication Sig Start Date End Date Taking? Authorizing Provider  ibuprofen (ADVIL,MOTRIN) 800 MG tablet  Take 800 mg by mouth every 8 (eight) hours as needed for pain.   Yes Historical Provider, MD  cephALEXin (KEFLEX) 500 MG capsule Take 1 capsule (500 mg total) by mouth 4 (four) times daily. Patient not taking: Reported on 04/27/2014 10/09/12   Evalee Jefferson, PA-C  nystatin (MYCOSTATIN/NYSTOP) 100000 UNIT/GM POWD Apply 1 g topically 3 (three) times daily. apply to candidal lesions TOPICALLY 2 to 3 times daily until healing complete Patient not taking: Reported on 04/27/2014 03/15/13   Ernestina Patches, MD   Physical Exam: Filed Vitals:   04/27/14 1200 04/27/14 1230 04/27/14 1421 04/27/14 1530  BP: 131/62 145/78 148/93 131/64  Pulse: 76 86 91 90  Temp:      TempSrc:      Resp:      Height:      Weight:      SpO2: 93% 92% 100% 94%    Wt Readings from Last 3 Encounters:  04/27/14 149.687 kg (330 lb)  03/15/13 151.048 kg (333 lb)  10/08/12 151.048 kg (333 lb)    General: appears to be in mild distress.  Eyes:  PERRL, normal lids, irises & conjunctiva ENT:  grossly normal hearing, lips & tongue Neck:  no LAD, masses or thyromegaly Cardiovascular:  RRR, no m/r/g. No LE edema. Telemetry:  SR, no arrhythmias  Respiratory:  CTA bilaterally, no w/r/r. Normal respiratory effort. Abdomen: Morbidly Obese, suprapubic and LLQ ttp, hypoactive BS Skin:  no rash or induration seen on limited  exam Musculoskeletal:  grossly normal tone BUE/BLE Psychiatric:  grossly normal mood and affect, speech fluent and appropriate Neurologic:  grossly non-focal.          Labs on Admission:  Basic Metabolic Panel:  Recent Labs Lab 04/27/14 1038  NA 137  K 4.0  CL 108  CO2 23  GLUCOSE 101*  BUN 17  CREATININE 0.88  CALCIUM 8.6   Liver Function Tests: No results for input(s): AST, ALT, ALKPHOS, BILITOT, PROT, ALBUMIN in the last 168 hours. No results for input(s): LIPASE, AMYLASE in the last 168 hours. No results for input(s): AMMONIA in the last 168 hours. CBC:  Recent Labs Lab 04/27/14 1038  WBC  11.0*  NEUTROABS 7.0  HGB 11.1*  HCT 34.7*  MCV 85.7  PLT 265   Cardiac Enzymes: No results for input(s): CKTOTAL, CKMB, CKMBINDEX, TROPONINI in the last 168 hours.  BNP (last 3 results) No results for input(s): BNP in the last 8760 hours.  ProBNP (last 3 results) No results for input(s): PROBNP in the last 8760 hours.  CBG: No results for input(s): GLUCAP in the last 168 hours.  Radiological Exams on Admission: Ct Abdomen Pelvis W Contrast  04/27/2014   CLINICAL DATA:  Left suprapubic pain  EXAM: CT ABDOMEN AND PELVIS WITH CONTRAST  TECHNIQUE: Multidetector CT imaging of the abdomen and pelvis was performed using the standard protocol following bolus administration of intravenous contrast.  CONTRAST:  22mL OMNIPAQUE IOHEXOL 300 MG/ML SOLN, 117mL OMNIPAQUE IOHEXOL 300 MG/ML SOLN  COMPARISON:  10/09/2012  FINDINGS: Sagittal images of the spine shows significant disc space flattening with vacuum disc phenomenon at L4-L5 and L5-S1 level.  Question Schmorl's node deformity upper endplate of L5 vertebral body. The lung bases are unremarkable. There are streak artifacts from patient's large body habitus.  There is mild hepatic fatty infiltration. No focal hepatic mass. No calcified gallstones are noted within gallbladder. The pancreas, spleen and adrenal glands are unremarkable. Kidneys are symmetrical in size and enhancement.  No aortic aneurysm. No small bowel obstruction. Normal appendix. No pericecal inflammation. Moderate stool are noted in right colon transverse colon and descending colon.  Colonic diverticula are noted in left colon.  In axial image 82 there is abnormal mild thickening of proximal sigmoid colon wall in left lower quadrant just above to urinary bladder. There is mild stranding of pericolonic fat. This is confirmed in coronal image 46. Findings are highly suspicious for acute diverticulitis. There is no diverticular abscess. No mesenteric abscess.  Minimal retroflexed uterus.  The  urinary bladder is unremarkable.  IMPRESSION: 1. Multiple sigmoid colon diverticula. There is mild thickening of the inferior wall of proximal sigmoid colon just above and lateral to urinary bladder. Mild stranding of pericolonic fat. Findings are highly suspicious for acute diverticulitis. No diverticular abscess is noted. No mesenteric abscess. Best seen in coronal image 47 2. Normal appendix.  No pericecal inflammation. 3. Moderate stool noted in right colon transverse colon and descending colon. 4. No small bowel obstruction. 5. No hydronephrosis or hydroureter. 6. Degenerative changes lumbar spine.   Electronically Signed   By: Lahoma Crocker M.D.   On: 04/27/2014 13:40     Assessment/Plan Principal Problem:   Diverticulitis Active Problems:   Morbid obesity   UTI (lower urinary tract infection)   Intractable abdominal pain   Leukocytosis  Abdominal pain: Likely secondary to diverticulitis as noted on CT. May also have UTI based on complaints as UA not overly impressive. Unlikely related to other acute  intrabdominal process. . WBC 11. Pain not controlled w/ Fentanyl 131mcg and Dilaudid 1mg . Started on IV Cipro adn metronidazole in ED - Admit - Continue Cipro flagyl - Morphine - zofran - clear liquid diet - advance as tolerated - lipase - 1/2NS 129ml/hr  UTI: possibe UTI based on symptoms. UA fairly unimpressive.  - Cipro as above - UCX - Urine micro - pyridium  Morbid obesity: - nutritional counseling - A1c, TSH, lipid panel  Code Status: FULL DVT Prophylaxis: Heparin Family Communication: None Disposition Plan: Pending improvement  Yiannis Tulloch Lenna Sciara, MD Family Medicine [facility] [email or web address] Password [email or web address]

## 2014-04-27 NOTE — ED Notes (Signed)
Pt  moaning.  States her pain is coming back.  Notified Dr. Lacinda Axon.  Orders received.

## 2014-04-27 NOTE — ED Provider Notes (Addendum)
CSN: [id number]     Arrival date & time 04/27/14  1003 History   This chart was scribed for Marilyn Christen, MD by Einar Pheasant, ED Scribe. This patient was seen in room APA05/APA05 and the patient's care was started at 11:01 AM.   Chief Complaint  Patient presents with  . Abdominal Pain   The history is provided by the patient and medical records. No language interpreter was used.   HPI Comments: Marilyn Rivera is a 34 y.o. female with PMhx of kidney stones and multiple UTIs presents to the Emergency Department complaining of sudden onset intermittent mid-lower abdominal pain that started 2 days ago but worsened last night. Pt endorses associated dysuria. LNMP was last month, unsure of pregnancy at the moment. Pt reports some little She denies fever, vaginal discharge, vaginal bleedingneck pain, sore throat, visual disturbance, CP, cough, SOB, abdominal pain, nausea, emesis, diarrhea, urinary symptoms, back pain, HA, weakness, numbness and rash as associated symptoms.     History reviewed. No pertinent past medical history. Past Surgical History  Procedure Laterality Date  . Knee surgery    . Renal calculi removal Left 2010   Family History  Problem Relation Age of Onset  . Heart failure Mother   . Cancer Mother   . Cancer Father   . Hypertension Father   . Diabetes Other   . Hypertension Other    History  Substance Use Topics  . Smoking status: Never Smoker   . Smokeless tobacco: Never Used  . Alcohol Use: No   OB History    No data available     Review of Systems  Constitutional: Negative for fever and chills.  HENT: Negative for congestion and sore throat.   Eyes: Negative for visual disturbance.  Respiratory: Negative for cough and shortness of breath.   Cardiovascular: Negative for chest pain and leg swelling.  Gastrointestinal: Positive for abdominal pain. Negative for nausea, vomiting and diarrhea.  Genitourinary: Negative for dysuria.  Musculoskeletal: Negative for  back pain and neck pain.  Skin: Negative for rash.  Neurological: Negative for headaches.  Hematological: Does not bruise/bleed easily.  Psychiatric/Behavioral: Negative for confusion.      Allergies  Review of patient's allergies indicates no known allergies.  Home Medications   Prior to Admission medications   Medication Sig Start Date End Date Taking? Authorizing Provider  ibuprofen (ADVIL,MOTRIN) 800 MG tablet Take 800 mg by mouth every 8 (eight) hours as needed for pain.   Yes Historical Provider, MD  cephALEXin (KEFLEX) 500 MG capsule Take 1 capsule (500 mg total) by mouth 4 (four) times daily. Patient not taking: Reported on 04/27/2014 10/09/12   Evalee Jefferson, PA-C  nystatin (MYCOSTATIN/NYSTOP) 100000 UNIT/GM POWD Apply 1 g topically 3 (three) times daily. apply to candidal lesions TOPICALLY 2 to 3 times daily until healing complete Patient not taking: Reported on 04/27/2014 03/15/13   Ernestina Patches, MD   BP 122/65 mmHg  Pulse 75  Temp(Src) 98.7 F (37.1 C) (Oral)  Resp 18  Ht 5' 9.5" (1.765 m)  Wt 330 lb (149.687 kg)  BMI 48.05 kg/m2  SpO2 100%  LMP 03/29/2014  Physical Exam  Constitutional: She is oriented to person, place, and time. She appears well-developed and well-nourished.  obese  HENT:  Head: Normocephalic and atraumatic.  Eyes: Conjunctivae and EOM are normal. Pupils are equal, round, and reactive to light.  Neck: Normal range of motion. Neck supple.  Cardiovascular: Normal rate and regular rhythm.   Pulmonary/Chest: Effort normal  and breath sounds normal.  Abdominal: Soft. Bowel sounds are normal.  Musculoskeletal: Normal range of motion.  Neurological: She is alert and oriented to person, place, and time.  Skin: Skin is warm and dry.  Psychiatric: She has a normal mood and affect. Her behavior is normal.  Nursing note and vitals reviewed.   ED Course  Procedures (including critical care time)  DIAGNOSTIC STUDIES: Oxygen Saturation is 100% on RA,  normal by my interpretation.    COORDINATION OF CARE: 11:06 AM- Pt advised of plan for treatment and pt agrees.  Results for orders placed or performed during the hospital encounter of 04/27/14  Urinalysis, Routine w reflex microscopic  Result Value Ref Range   Color, Urine YELLOW YELLOW   APPearance CLEAR CLEAR   Specific Gravity, Urine 1.020 1.005 - 1.030   pH 7.5 5.0 - 8.0   Glucose, UA NEGATIVE NEGATIVE mg/dL   Hgb urine dipstick MODERATE (A) NEGATIVE   Bilirubin Urine NEGATIVE NEGATIVE   Ketones, ur NEGATIVE NEGATIVE mg/dL   Protein, ur NEGATIVE NEGATIVE mg/dL   Urobilinogen, UA 0.2 0.0 - 1.0 mg/dL   Nitrite NEGATIVE NEGATIVE   Leukocytes, UA NEGATIVE NEGATIVE  Pregnancy, urine  Result Value Ref Range   Preg Test, Ur NEGATIVE NEGATIVE  CBC with Differential  Result Value Ref Range   WBC 11.0 (H) 4.0 - 10.5 K/uL   RBC 4.05 3.87 - 5.11 MIL/uL   Hemoglobin 11.1 (L) 12.0 - 15.0 g/dL   HCT 34.7 (L) 36.0 - 46.0 %   MCV 85.7 78.0 - 100.0 fL   MCH 27.4 26.0 - 34.0 pg   MCHC 32.0 30.0 - 36.0 g/dL   RDW 14.3 11.5 - 15.5 %   Platelets 265 150 - 400 K/uL   Neutrophils Relative % 64 43 - 77 %   Neutro Abs 7.0 1.7 - 7.7 K/uL   Lymphocytes Relative 28 12 - 46 %   Lymphs Abs 3.1 0.7 - 4.0 K/uL   Monocytes Relative 8 3 - 12 %   Monocytes Absolute 0.8 0.1 - 1.0 K/uL   Eosinophils Relative 0 0 - 5 %   Eosinophils Absolute 0.0 0.0 - 0.7 K/uL   Basophils Relative 0 0 - 1 %   Basophils Absolute 0.0 0.0 - 0.1 K/uL  Basic metabolic panel  Result Value Ref Range   Sodium 137 135 - 145 mmol/L   Potassium 4.0 3.5 - 5.1 mmol/L   Chloride 108 96 - 112 mmol/L   CO2 23 19 - 32 mmol/L   Glucose, Bld 101 (H) 70 - 99 mg/dL   BUN 17 6 - 23 mg/dL   Creatinine, Ser 0.88 0.50 - 1.10 mg/dL   Calcium 8.6 8.4 - 10.5 mg/dL   GFR calc non Af Amer 85 (L) >90 mL/min   GFR calc Af Amer >90 >90 mL/min   Anion gap 6 5 - 15  Urine microscopic-add on  Result Value Ref Range   Squamous Epithelial / LPF  MANY (A) RARE   WBC, UA 0-2 <3 WBC/hpf   RBC / HPF 7-10 <3 RBC/hpf   Bacteria, UA MANY (A) RARE   Ct Abdomen Pelvis W Contrast  04/27/2014   CLINICAL DATA:  Left suprapubic pain  EXAM: CT ABDOMEN AND PELVIS WITH CONTRAST  TECHNIQUE: Multidetector CT imaging of the abdomen and pelvis was performed using the standard protocol following bolus administration of intravenous contrast.  CONTRAST:  44mL OMNIPAQUE IOHEXOL 300 MG/ML SOLN, 124mL OMNIPAQUE IOHEXOL 300 MG/ML SOLN  COMPARISON:  10/09/2012  FINDINGS: Sagittal images of the spine shows significant disc space flattening with vacuum disc phenomenon at L4-L5 and L5-S1 level.  Question Schmorl's node deformity upper endplate of L5 vertebral body. The lung bases are unremarkable. There are streak artifacts from patient's large body habitus.  There is mild hepatic fatty infiltration. No focal hepatic mass. No calcified gallstones are noted within gallbladder. The pancreas, spleen and adrenal glands are unremarkable. Kidneys are symmetrical in size and enhancement.  No aortic aneurysm. No small bowel obstruction. Normal appendix. No pericecal inflammation. Moderate stool are noted in right colon transverse colon and descending colon.  Colonic diverticula are noted in left colon.  In axial image 82 there is abnormal mild thickening of proximal sigmoid colon wall in left lower quadrant just above to urinary bladder. There is mild stranding of pericolonic fat. This is confirmed in coronal image 46. Findings are highly suspicious for acute diverticulitis. There is no diverticular abscess. No mesenteric abscess.  Minimal retroflexed uterus.  The urinary bladder is unremarkable.  IMPRESSION: 1. Multiple sigmoid colon diverticula. There is mild thickening of the inferior wall of proximal sigmoid colon just above and lateral to urinary bladder. Mild stranding of pericolonic fat. Findings are highly suspicious for acute diverticulitis. No diverticular abscess is noted. No  mesenteric abscess. Best seen in coronal image 47 2. Normal appendix.  No pericecal inflammation. 3. Moderate stool noted in right colon transverse colon and descending colon. 4. No small bowel obstruction. 5. No hydronephrosis or hydroureter. 6. Degenerative changes lumbar spine.   Electronically Signed   By: Lahoma Crocker M.D.   On: 04/27/2014 13:40      MDM   Final diagnoses:  Suprapubic pain  Diverticulitis of large intestine without perforation or abscess without bleeding    CT scan reveals multiple sigmoid diverticula. Findings are suspicious for acute diverticulitis. No abscess noted. IV Flagyl, IV Cipro, IV pain management. Admit.  I personally performed the services described in this documentation, which was scribed in my presence. The recorded information has been reviewed and is accurate.    Marilyn Christen, MD 04/27/14 [place], MD 04/27/14 [id number]

## 2014-04-28 ENCOUNTER — Encounter: Payer: Self-pay | Admitting: Internal Medicine

## 2014-04-28 ENCOUNTER — Telehealth: Payer: Self-pay | Admitting: Gastroenterology

## 2014-04-28 ENCOUNTER — Encounter (HOSPITAL_COMMUNITY): Payer: Self-pay | Admitting: Gastroenterology

## 2014-04-28 DIAGNOSIS — D649 Anemia, unspecified: Secondary | ICD-10-CM | POA: Insufficient documentation

## 2014-04-28 DIAGNOSIS — K5720 Diverticulitis of large intestine with perforation and abscess without bleeding: Secondary | ICD-10-CM

## 2014-04-28 LAB — COMPREHENSIVE METABOLIC PANEL
ALT: 16 U/L (ref 0–35)
ANION GAP: 6 (ref 5–15)
AST: 14 U/L (ref 0–37)
Albumin: 3.2 g/dL — ABNORMAL LOW (ref 3.5–5.2)
Alkaline Phosphatase: 56 U/L (ref 39–117)
BUN: 9 mg/dL (ref 6–23)
CO2: 24 mmol/L (ref 19–32)
CREATININE: 0.84 mg/dL (ref 0.50–1.10)
Calcium: 8.5 mg/dL (ref 8.4–10.5)
Chloride: 108 mmol/L (ref 96–112)
GFR calc non Af Amer: 90 mL/min — ABNORMAL LOW (ref >90)
GLUCOSE: 93 mg/dL (ref 70–99)
Potassium: 3.7 mmol/L (ref 3.5–5.1)
Sodium: 138 mmol/L (ref 135–145)
Total Bilirubin: 0.2 mg/dL — ABNORMAL LOW (ref 0.3–1.2)
Total Protein: 6.9 g/dL (ref 6.0–8.3)

## 2014-04-28 LAB — LIPID PANEL
CHOLESTEROL: 132 mg/dL (ref 0–200)
HDL: 46 mg/dL (ref >39)
LDL Cholesterol: 74 mg/dL (ref 0–99)
Total CHOL/HDL Ratio: 2.9 RATIO
Triglycerides: 62 mg/dL (ref <150)
VLDL: 12 mg/dL (ref 0–40)

## 2014-04-28 LAB — LIPASE, BLOOD: Lipase: 40 U/L (ref 11–59)

## 2014-04-28 LAB — CBC
HEMATOCRIT: 31.6 % — AB (ref 36.0–46.0)
Hemoglobin: 9.9 g/dL — ABNORMAL LOW (ref 12.0–15.0)
MCH: 26.9 pg (ref 26.0–34.0)
MCHC: 31.3 g/dL (ref 30.0–36.0)
MCV: 85.9 fL (ref 78.0–100.0)
Platelets: 238 10*3/uL (ref 150–400)
RBC: 3.68 MIL/uL — ABNORMAL LOW (ref 3.87–5.11)
RDW: 14.5 % (ref 11.5–15.5)
WBC: 8.6 10*3/uL (ref 4.0–10.5)

## 2014-04-28 MED ORDER — DOCUSATE SODIUM 100 MG PO CAPS
200.0000 mg | ORAL_CAPSULE | Freq: Two times a day (BID) | ORAL | Status: DC
  Administered 2014-04-28 – 2014-05-01 (×7): 200 mg via ORAL
  Filled 2014-04-28 (×7): qty 2

## 2014-04-28 MED ORDER — POLYETHYLENE GLYCOL 3350 17 G PO PACK
17.0000 g | PACK | Freq: Every day | ORAL | Status: DC
  Administered 2014-04-28 – 2014-04-29 (×2): 17 g via ORAL
  Filled 2014-04-28 (×2): qty 1

## 2014-04-28 MED ORDER — SODIUM CHLORIDE 0.9 % IV SOLN
INTRAVENOUS | Status: DC
  Administered 2014-04-28: 11:00:00 via INTRAVENOUS

## 2014-04-28 NOTE — Telephone Encounter (Signed)
Needs hospital follow up of diverticulitis/anemia in 6-8 weeks.

## 2014-04-28 NOTE — Plan of Care (Signed)
Problem: Food- and Nutrition-Related Knowledge Deficit (NB-1.1) Goal: Nutrition education Formal process to instruct or train a patient/client in a skill or to impart knowledge to help patients/clients voluntarily manage or modify food choices and eating behavior to maintain or improve health. Outcome: Completed/Met Date Met:  04/28/14  RD consulted for nutrition education regarding weight loss.  Body mass index is 48.05 kg/(m^2). Pt meets criteria for Morbidity based on current BMI.  RD provided "Plate Method Menu Ideas" handout. Patient reports already being on a diet. She claims to have lost 30#s in 8 months. She stated that she had taken a healthy eating class in the past and knows what to do. Some strategies she has started include: walking, drinking a lot of water, and staying away from high sugary foods. Went over plate method with patient; she was already familiar with.  Expect Good compliance.  Current diet order is soft, no record of patient meals at this time. Labs and medications reviewed. No further nutrition interventions warranted at this time. If additional nutrition issues arise, please re-consult RD.  Burtis Junes RD, LDN Nutrition Pager: [phone] 04/28/2014 1:41 PM

## 2014-04-28 NOTE — Progress Notes (Signed)
Patient Demographics  Marilyn Rivera, is a 34 y.o. female, DOB - 1981/02/25, OFB:[id number]  Admit date - 04/27/2014   Admitting Physician Waldemar Dickens, MD  Outpatient Primary MD for the patient is No PCP Per Patient  LOS - 1   Chief Complaint  Patient presents with  . Abdominal Pain        Subjective:   [name] today has, No headache, No chest pain, mild abdominal pain - No Nausea, No new weakness tingling or numbness, No Cough - SOB.   Assessment & Plan    1. Acute diverticulitis. Continue on soft diet, gentle IV fluids and IV Cipro Flagyl, afebrile with no leukocytosis, still complaining of some pain. GI consult requested. Likely home in 1-2 days with outpatient GI follow-up sequentially. Initiated on bowel regimen to avoid constipation, continue supportive care.   2. Morbid obesity. Follow with PCP for weight loss.   3. Possible UTI. Follow cultures. On Cipro.     Code Status: Full  Family Communication: husband  Disposition Plan: Home   Procedures  CT Abd Pelvis   Consults  GI   Medications  Scheduled Meds: . ciprofloxacin  400 mg Intravenous Q12H  . docusate sodium  200 mg Oral BID  . heparin  5,000 Units Subcutaneous 3 times per day  . metronidazole  500 mg Intravenous Q8H  . phenazopyridine  100-200 mg Oral TID WC  . polyethylene glycol  17 g Oral Daily   Continuous Infusions: . sodium chloride     PRN Meds:.acetaminophen **OR** [DISCONTINUED] acetaminophen, morphine injection, [DISCONTINUED] ondansetron **OR** ondansetron (ZOFRAN) IV  DVT Prophylaxis   Heparin   Lab Results  Component Value Date   PLT 238 04/28/2014    Antibiotics     Anti-infectives    Start     Dose/Rate Route Frequency Ordered Stop   04/27/14 2200  metroNIDAZOLE (FLAGYL)  IVPB 500 mg     500 mg 100 mL/hr over 60 Minutes Intravenous Every 8 hours 04/27/14 1559     04/27/14 1600  ciprofloxacin (CIPRO) IVPB 400 mg     400 mg 200 mL/hr over 60 Minutes Intravenous Every 12 hours 04/27/14 1600     04/27/14 1400  ciprofloxacin (CIPRO) IVPB 400 mg  Status:  Discontinued     400 mg 200 mL/hr over 60 Minutes Intravenous  Once 04/27/14 1357 04/27/14 1600   04/27/14 1400  metroNIDAZOLE (FLAGYL) IVPB 500 mg     500 mg 100 mL/hr over 60 Minutes Intravenous  Once 04/27/14 1357 04/27/14 1620          Objective:   Filed Vitals:   04/27/14 1630 04/27/14 1700 04/27/14 2338 04/28/14 0500  BP: 132/61 141/62 133/79 147/71  Pulse: 80 79 82 61  Temp:   100.1 F (37.8 C) 98.7 F (37.1 C)  TempSrc:   Oral Oral  Resp:   20 20  Height:      Weight:      SpO2: 94% 96% 97% 95%    Wt Readings from Last 3 Encounters:  04/27/14 149.687 kg (330 lb)  03/15/13 151.048 kg (333 lb)  10/08/12 151.048 kg (333 lb)     Intake/Output Summary (Last 24 hours) at 04/28/14 0943 Last data filed at 04/28/14  0524  Gross per 24 hour  Intake   2155 ml  Output      0 ml  Net   2155 ml     Physical Exam  Awake Alert, Oriented X 3, No new F.N deficits, Normal affect [place].AT,PERRAL Supple Neck,No JVD, No cervical lymphadenopathy appriciated.  Symmetrical Chest wall movement, Good air movement bilaterally, CTAB RRR,No Gallops,Rubs or new Murmurs, No Parasternal Heave +ve B.Sounds, Abd Soft, mild lower quadrant tenderness, No organomegaly appriciated, No rebound - guarding or rigidity. No Cyanosis, Clubbing or edema, No new Rash or bruise      Data Review   Micro Results No results found for this or any previous visit (from the past 240 hour(s)).  Radiology Reports Ct Abdomen Pelvis W Contrast  04/27/2014   CLINICAL DATA:  Left suprapubic pain  EXAM: CT ABDOMEN AND PELVIS WITH CONTRAST  TECHNIQUE: Multidetector CT imaging of the abdomen and pelvis was performed using the  standard protocol following bolus administration of intravenous contrast.  CONTRAST:  65mL OMNIPAQUE IOHEXOL 300 MG/ML SOLN, 18mL OMNIPAQUE IOHEXOL 300 MG/ML SOLN  COMPARISON:  10/09/2012  FINDINGS: Sagittal images of the spine shows significant disc space flattening with vacuum disc phenomenon at L4-L5 and L5-S1 level.  Question Schmorl's node deformity upper endplate of L5 vertebral body. The lung bases are unremarkable. There are streak artifacts from patient's large body habitus.  There is mild hepatic fatty infiltration. No focal hepatic mass. No calcified gallstones are noted within gallbladder. The pancreas, spleen and adrenal glands are unremarkable. Kidneys are symmetrical in size and enhancement.  No aortic aneurysm. No small bowel obstruction. Normal appendix. No pericecal inflammation. Moderate stool are noted in right colon transverse colon and descending colon.  Colonic diverticula are noted in left colon.  In axial image 82 there is abnormal mild thickening of proximal sigmoid colon wall in left lower quadrant just above to urinary bladder. There is mild stranding of pericolonic fat. This is confirmed in coronal image 46. Findings are highly suspicious for acute diverticulitis. There is no diverticular abscess. No mesenteric abscess.  Minimal retroflexed uterus.  The urinary bladder is unremarkable.  IMPRESSION: 1. Multiple sigmoid colon diverticula. There is mild thickening of the inferior wall of proximal sigmoid colon just above and lateral to urinary bladder. Mild stranding of pericolonic fat. Findings are highly suspicious for acute diverticulitis. No diverticular abscess is noted. No mesenteric abscess. Best seen in coronal image 47 2. Normal appendix.  No pericecal inflammation. 3. Moderate stool noted in right colon transverse colon and descending colon. 4. No small bowel obstruction. 5. No hydronephrosis or hydroureter. 6. Degenerative changes lumbar spine.   Electronically Signed   By:  Lahoma Crocker M.D.   On: 04/27/2014 13:40     CBC  Recent Labs Lab 04/27/14 1038 04/28/14 0556  WBC 11.0* 8.6  HGB 11.1* 9.9*  HCT 34.7* 31.6*  PLT 265 238  MCV 85.7 85.9  MCH 27.4 26.9  MCHC 32.0 31.3  RDW 14.3 14.5  LYMPHSABS 3.1  --   MONOABS 0.8  --   EOSABS 0.0  --   BASOSABS 0.0  --     Chemistries   Recent Labs Lab 04/27/14 1038 04/28/14 0556  NA 137 138  K 4.0 3.7  CL 108 108  CO2 23 24  GLUCOSE 101* 93  BUN 17 9  CREATININE 0.88 0.84  CALCIUM 8.6 8.5  AST  --  14  ALT  --  16  ALKPHOS  --  15  BILITOT  --  0.2*   ------------------------------------------------------------------------------------------------------------------ estimated creatinine clearance is 149.4 mL/min (by C-G formula based on Cr of 0.84). ------------------------------------------------------------------------------------------------------------------ No results for input(s): HGBA1C in the last 72 hours. ------------------------------------------------------------------------------------------------------------------  Recent Labs  04/28/14 0556  CHOL 132  HDL 46  LDLCALC 74  TRIG 62  CHOLHDL 2.9   ------------------------------------------------------------------------------------------------------------------  Recent Labs  04/27/14 1038  TSH 2.055   ------------------------------------------------------------------------------------------------------------------ No results for input(s): VITAMINB12, FOLATE, FERRITIN, TIBC, IRON, RETICCTPCT in the last 72 hours.  Coagulation profile No results for input(s): INR, PROTIME in the last 168 hours.  No results for input(s): DDIMER in the last 72 hours.  Cardiac Enzymes No results for input(s): CKMB, TROPONINI, MYOGLOBIN in the last 168 hours.  Invalid input(s): CK ------------------------------------------------------------------------------------------------------------------ Invalid input(s): POCBNP     Time Spent  in minutes   35   Maddyx Vallie K M.D on 04/28/2014 at 9:43 AM  Between 7am to 7pm - Pager - [phone]  After 7pm go to [email or web address] - password [facility]  [facility]   Office  [phone]

## 2014-04-28 NOTE — Consult Note (Signed)
Referring Provider: Thurnell Lose, MD Primary Care Physician:  No PCP Per Patient Primary Gastroenterologist:  Garfield Cornea, MD  Reason for Consultation:  diverticulitis  HPI: Marilyn Rivera is a 34 y.o. female admitted lower abdominal pain. Symptoms started Monday and progressed over the last couple of days. Described as crampy in nature. Has not had a bowel movement in 2 days. Denies blood in the stool or melena. At baseline typically has 1-2 bowel movements daily. Denies any fever or chills. No vomiting. She has history of heartburn, takes over-the-counter medication twice a day, either Prilosec, Prevacid, Nexium which ever is cheapest. Denies any dysphagia. Takes ibuprofen up to 800 mg about once daily for knee pain.  On admission she had a white blood cell count 11,000, hemoglobin 11.1. Today her hemoglobin is 9.9. Reports last 2 menstrual cycles extremely heavy, significant bleeding for 7 day days at a time. Prior to the last 2 menstrual cycle she skipped couple months. CT scan yesterday showed multiple sigmoid colon diverticula, mild thickening of the inferior wall of proximal sigmoid colon just above and lateral to the urinary bladder. Mild stranding of pericolonic fat. Highly suspicious for acute diverticulitis. Currently on IV Cipro and IV Flagyl. Consuming soft diet. Receiving morphine every 3 hours.  No prior history of diverticulitis. No previous colonoscopy.   Prior to Admission medications   Medication Sig Start Date End Date Taking? Authorizing Provider  ibuprofen (ADVIL,MOTRIN) 800 MG tablet Take 800 mg by mouth every 8 (eight) hours as needed for pain.   Yes Historical Provider, MD    Current Facility-Administered Medications  Medication Dose Route Frequency Provider Last Rate Last Dose  . 0.9 %  sodium chloride infusion   Intravenous Continuous Thurnell Lose, MD      . acetaminophen (TYLENOL) tablet 650 mg  650 mg Oral Q6H PRN Waldemar Dickens, MD      . ciprofloxacin  (CIPRO) IVPB 400 mg  400 mg Intravenous Q12H Waldemar Dickens, MD 200 mL/hr at 04/28/14 0326 400 mg at 04/28/14 0326  . docusate sodium (COLACE) capsule 200 mg  200 mg Oral BID Thurnell Lose, MD   200 mg at 04/28/14 1006  . heparin injection 5,000 Units  5,000 Units Subcutaneous 3 times per day Waldemar Dickens, MD   5,000 Units at 04/28/14 0519  . metroNIDAZOLE (FLAGYL) IVPB 500 mg  500 mg Intravenous Q8H Waldemar Dickens, MD   500 mg at 04/28/14 0519  . morphine 4 MG/ML injection 4 mg  4 mg Intravenous Q2H PRN Waldemar Dickens, MD   4 mg at 04/28/14 1006  . ondansetron (ZOFRAN) injection 4 mg  4 mg Intravenous Q6H PRN Waldemar Dickens, MD      . phenazopyridine (PYRIDIUM) tablet 100-200 mg  100-200 mg Oral TID WC Waldemar Dickens, MD   200 mg at 04/28/14 3382  . polyethylene glycol (MIRALAX / GLYCOLAX) packet 17 g  17 g Oral Daily Thurnell Lose, MD   17 g at 04/28/14 1006    Allergies as of 04/27/2014  . (No Known Allergies)    Past Medical History  Diagnosis Date  . Kidney stones     Past Surgical History  Procedure Laterality Date  . Knee surgery    . Renal calculi removal Left 2010    Family History  Problem Relation Age of Onset  . Heart failure Mother   . Cancer Other     mother's side of family, breast cancer  .  Cancer Other     father's side of family, leukemia  . Hypertension Father   . Diabetes Other   . Hypertension Other   . Colon cancer Neg Hx     History   Social History  . Marital Status: Married    Spouse Name: N/A  . Number of Children: 0  . Years of Education: N/A   Occupational History  . [employer], coordinator for resource side    Social History Main Topics  . Smoking status: Never Smoker   . Smokeless tobacco: Never Used  . Alcohol Use: No  . Drug Use: No  . Sexual Activity: Yes    Birth Control/ Protection: None   Other Topics Concern  . Not on file   Social History Narrative     ROS:  General: Negative for anorexia, weight loss,  fever, chills, fatigue, weakness. Eyes: Negative for vision changes.  ENT: Negative for hoarseness, difficulty swallowing , nasal congestion. CV: Negative for chest pain, angina, palpitations, dyspnea on exertion, peripheral edema.  Respiratory: Negative for dyspnea at rest, dyspnea on exertion, cough, sputum, wheezing.  GI: See history of present illness. GU:  Negative for dysuria, hematuria, urinary incontinence, urinary frequency, nocturnal urination.  MS: Negative for joint pain, low back pain.  Derm: Negative for rash or itching.  Neuro: Negative for weakness, abnormal sensation, seizure, frequent headaches, memory loss, confusion.  Psych: Negative for anxiety, depression, suicidal ideation, hallucinations.  Endo: Negative for unusual weight change.  Heme: Negative for bruising or bleeding. Allergy: Negative for rash or hives.       Physical Examination: Vital signs in last 24 hours: Temp:  [98.7 F (37.1 C)-100.1 F (37.8 C)] 98.7 F (37.1 C) (03/03 0500) Pulse Rate:  [61-91] 61 (03/03 0500) Resp:  [20] 20 (03/03 0500) BP: (131-148)/(61-98) 147/71 mmHg (03/03 0500) SpO2:  [92 %-100 %] 95 % (03/03 0500) Last BM Date: 04/26/14  General: Well-nourished, well-developed in no acute distress. Morbidly obese Head: Normocephalic, atraumatic.   Eyes: Conjunctiva pink, no icterus. Mouth: Oropharyngeal mucosa moist and pink , no lesions erythema or exudate. Neck: Supple without thyromegaly, masses, or lymphadenopathy.  Lungs: Clear to auscultation bilaterally.  Heart: Regular rate and rhythm, no murmurs rubs or gallops.  Abdomen: Bowel sounds are normal, moderate suprapubic tenderness, nondistended, no hepatosplenomegaly or masses, no abdominal bruits or    hernia , no rebound or guarding.  Exam limited by body habitus Rectal: Not performed Extremities: No lower extremity edema, clubbing, deformity.  Neuro: Alert and oriented x 4 , grossly normal neurologically.  Skin: Warm and dry,  no rash or jaundice.   Psych: Alert and cooperative, normal mood and affect.        Intake/Output from previous day: 03/02 0701 - 03/03 0700 In: 2155 [P.O.:480; I.V.:1075; IV Piggyback:600] Out: -  Intake/Output this shift:    Lab Results: CBC  Recent Labs  04/27/14 1038 04/28/14 0556  WBC 11.0* 8.6  HGB 11.1* 9.9*  HCT 34.7* 31.6*  MCV 85.7 85.9  PLT 265 238   BMET  Recent Labs  04/27/14 1038 04/28/14 0556  NA 137 138  K 4.0 3.7  CL 108 108  CO2 23 24  GLUCOSE 101* 93  BUN 17 9  CREATININE 0.88 0.84  CALCIUM 8.6 8.5   LFT  Recent Labs  04/28/14 0556  BILITOT 0.2*  ALKPHOS 56  AST 14  ALT 16  PROT 6.9  ALBUMIN 3.2*    Lipase  Recent Labs  04/28/14 0556  LIPASE 40    PT/INR No results for input(s): LABPROT, INR in the last 72 hours.    Imaging Studies: Ct Abdomen Pelvis W Contrast  04/27/2014   CLINICAL DATA:  Left suprapubic pain  EXAM: CT ABDOMEN AND PELVIS WITH CONTRAST  TECHNIQUE: Multidetector CT imaging of the abdomen and pelvis was performed using the standard protocol following bolus administration of intravenous contrast.  CONTRAST:  68mL OMNIPAQUE IOHEXOL 300 MG/ML SOLN, 135mL OMNIPAQUE IOHEXOL 300 MG/ML SOLN  COMPARISON:  10/09/2012  FINDINGS: Sagittal images of the spine shows significant disc space flattening with vacuum disc phenomenon at L4-L5 and L5-S1 level.  Question Schmorl's node deformity upper endplate of L5 vertebral body. The lung bases are unremarkable. There are streak artifacts from patient's large body habitus.  There is mild hepatic fatty infiltration. No focal hepatic mass. No calcified gallstones are noted within gallbladder. The pancreas, spleen and adrenal glands are unremarkable. Kidneys are symmetrical in size and enhancement.  No aortic aneurysm. No small bowel obstruction. Normal appendix. No pericecal inflammation. Moderate stool are noted in right colon transverse colon and descending colon.  Colonic diverticula are  noted in left colon.  In axial image 82 there is abnormal mild thickening of proximal sigmoid colon wall in left lower quadrant just above to urinary bladder. There is mild stranding of pericolonic fat. This is confirmed in coronal image 46. Findings are highly suspicious for acute diverticulitis. There is no diverticular abscess. No mesenteric abscess.  Minimal retroflexed uterus.  The urinary bladder is unremarkable.  IMPRESSION: 1. Multiple sigmoid colon diverticula. There is mild thickening of the inferior wall of proximal sigmoid colon just above and lateral to urinary bladder. Mild stranding of pericolonic fat. Findings are highly suspicious for acute diverticulitis. No diverticular abscess is noted. No mesenteric abscess. Best seen in coronal image 47 2. Normal appendix.  No pericecal inflammation. 3. Moderate stool noted in right colon transverse colon and descending colon. 4. No small bowel obstruction. 5. No hydronephrosis or hydroureter. 6. Degenerative changes lumbar spine.   Electronically Signed   By: Lahoma Crocker M.D.   On: 04/27/2014 13:40  [4 week]   Impression: 34 year old lady with acute onset lower abdominal pain, CT findings highly suspicious for acute uncomplicated diverticulitis. This is her first episode. Currently on IV Cipro and IV Flagyl. Requiring morphine every 3 hours. Consuming soft diet/low residue diet. Some decline in hemoglobin since admission likely in part due to dilution. Reports recent heavy menses likely contributing factor to anemia.  Plan: 1. Complete 2 week course of antibiotic therapy. Transition to oral once pain is improved. 2. Low-residue diet for now. Will transition to high fiber diet once current episode resolved. 3. Recommend outpatient follow up with [place] in 6-8 weeks for anemia.   We would like to thank you for the opportunity to participate in the care of EVALISE ABRUZZESE.  Laureen Ochs. Bernarda Caffey [facility] [phone] 3/3/20161:16 PM     LOS: 1 day      Attending note:  Patient seen and examined. CT reviewed. Patient with increased left lower quadrant abdominal pain after eating lunch which included barbecue. We'll back her off to a clear liquid diet for the remainder of today. She will be reassessed tomorrow morning.

## 2014-04-28 NOTE — Telephone Encounter (Signed)
APPOINTMENT MADE AND LETTER SENT °

## 2014-04-28 NOTE — Care Management Note (Addendum)
    Page 1 of 1   04/29/2014     3:09:31 PM CARE MANAGEMENT NOTE 04/29/2014  Patient:  Marilyn Rivera, Marilyn Rivera   Account Number:  [id number]  Date Initiated:  04/28/2014  Documentation initiated by:  Theophilus Kinds  Subjective/Objective Assessment:   Pt admitted from home with diverticulitis. Pt lives with her husband and will return home at discharge. Pt is independent with ADL's.     Action/Plan:   Finanical counselor is aware of self pay status. Will arrange PCP with [facility] ? need for [facility] voucher.   Anticipated DC Date:  05/02/2014   Anticipated DC Plan:  [place]  CM consult      Choice offered to / List presented to:             Status of service:  Completed, signed off Medicare Important Message given?   (If response is "NO", the following Medicare IM given date fields will be blank) Date Medicare IM given:   Medicare IM given by:   Date Additional Medicare IM given:   Additional Medicare IM given by:    Discharge Disposition:  HOME/SELF CARE  Per UR Regulation:    If discussed at Long Length of Stay Meetings, dates discussed:    Comments:  04/29/14 [place], RN BSN CM Anticipate discharge over the weekend. No CM needs noted. Pt has followup apt made with [facility] and documented on AVS and pt made aware. Pt stated that she could pay for her medications.  04/28/14 [place], RN BSN CM

## 2014-04-28 NOTE — Progress Notes (Signed)
UR chart review completed.  

## 2014-04-29 DIAGNOSIS — K5792 Diverticulitis of intestine, part unspecified, without perforation or abscess without bleeding: Secondary | ICD-10-CM

## 2014-04-29 LAB — HEMOGLOBIN A1C
Hgb A1c MFr Bld: 5.9 % — ABNORMAL HIGH (ref 4.8–5.6)
Mean Plasma Glucose: 123 mg/dL

## 2014-04-29 MED ORDER — ONDANSETRON HCL 4 MG PO TABS
4.0000 mg | ORAL_TABLET | Freq: Three times a day (TID) | ORAL | Status: DC
  Administered 2014-04-29 – 2014-05-01 (×6): 4 mg via ORAL
  Filled 2014-04-29 (×6): qty 1

## 2014-04-29 MED ORDER — PHENAZOPYRIDINE HCL 100 MG PO TABS
100.0000 mg | ORAL_TABLET | Freq: Three times a day (TID) | ORAL | Status: DC
  Administered 2014-04-29 – 2014-05-01 (×6): 100 mg via ORAL
  Filled 2014-04-29 (×6): qty 1

## 2014-04-29 MED ORDER — SODIUM CHLORIDE 0.9 % IV SOLN: INTRAVENOUS | Status: DC

## 2014-04-29 NOTE — Progress Notes (Signed)
Patient Demographics  Marilyn Rivera, is a 34 y.o. female, DOB - December 27, 1980, FKC:[id number]  Admit date - 04/27/2014   Admitting Physician Waldemar Dickens, MD  Outpatient Primary MD for the patient is No PCP Per Patient  LOS - 2   Chief Complaint  Patient presents with  . Abdominal Pain        Subjective:   [name] today has, No headache, No chest pain, mild abdominal pain - No Nausea, No new weakness tingling or numbness, No Cough - SOB.   Assessment & Plan    1. Acute diverticulitis. Continue on soft diet, gentle IV fluids and IV Cipro Flagyl, afebrile with no leukocytosis, still complaining of some pain. GI consult requested. Likely home in 1-2 days with outpatient GI follow-up sequentially. Initiated on bowel regimen to avoid constipation, continue supportive care. Clinically better.   2. Morbid obesity. Follow with PCP for weight loss.    3. Possible UTI. Follow cultures. On Cipro.     Code Status: Full  Family Communication: husband  Disposition Plan: Home   Procedures  CT Abd Pelvis   Consults  GI   Medications  Scheduled Meds: . ciprofloxacin  400 mg Intravenous Q12H  . docusate sodium  200 mg Oral BID  . heparin  5,000 Units Subcutaneous 3 times per day  . metronidazole  500 mg Intravenous Q8H  . phenazopyridine  100-200 mg Oral TID WC  . polyethylene glycol  17 g Oral Daily   Continuous Infusions: . sodium chloride     PRN Meds:.acetaminophen **OR** [DISCONTINUED] acetaminophen, morphine injection, [DISCONTINUED] ondansetron **OR** ondansetron (ZOFRAN) IV  DVT Prophylaxis   Heparin   Lab Results  Component Value Date   PLT 238 04/28/2014    Antibiotics     Anti-infectives    Start     Dose/Rate Route Frequency Ordered Stop   04/27/14 2200   metroNIDAZOLE (FLAGYL) IVPB 500 mg     500 mg 100 mL/hr over 60 Minutes Intravenous Every 8 hours 04/27/14 1559     04/27/14 1600  ciprofloxacin (CIPRO) IVPB 400 mg     400 mg 200 mL/hr over 60 Minutes Intravenous Every 12 hours 04/27/14 1600     04/27/14 1400  ciprofloxacin (CIPRO) IVPB 400 mg  Status:  Discontinued     400 mg 200 mL/hr over 60 Minutes Intravenous  Once 04/27/14 1357 04/27/14 1600   04/27/14 1400  metroNIDAZOLE (FLAGYL) IVPB 500 mg     500 mg 100 mL/hr over 60 Minutes Intravenous  Once 04/27/14 1357 04/27/14 1620          Objective:   Filed Vitals:   04/28/14 0500 04/28/14 1526 04/28/14 2141 04/29/14 0616  BP: 147/71 107/58 122/67 111/67  Pulse: 61 79 76 64  Temp: 98.7 F (37.1 C) 98.2 F (36.8 C) 99 F (37.2 C) 98.3 F (36.8 C)  TempSrc: Oral Oral Oral Oral  Resp: 20 18 18 18   Height:      Weight:      SpO2: 95% 97% 96% 98%    Wt Readings from Last 3 Encounters:  04/27/14 149.687 kg (330 lb)  03/15/13 151.048 kg (333 lb)  10/08/12 151.048 kg (333 lb)     Intake/Output Summary (Last 24  hours) at 04/29/14 0830 Last data filed at 04/28/14 1900  Gross per 24 hour  Intake 1398.75 ml  Output      0 ml  Net 1398.75 ml     Physical Exam  Awake Alert, Oriented X 3, No new F.N deficits, Normal affect [place].AT,PERRAL Supple Neck,No JVD, No cervical lymphadenopathy appriciated.  Symmetrical Chest wall movement, Good air movement bilaterally, CTAB RRR,No Gallops,Rubs or new Murmurs, No Parasternal Heave +ve B.Sounds, Abd Soft, mild lower quadrant tenderness, No organomegaly appriciated, No rebound - guarding or rigidity. No Cyanosis, Clubbing or edema, No new Rash or bruise      Data Review   Micro Results No results found for this or any previous visit (from the past 240 hour(s)).  Radiology Reports Ct Abdomen Pelvis W Contrast  04/27/2014   CLINICAL DATA:  Left suprapubic pain  EXAM: CT ABDOMEN AND PELVIS WITH CONTRAST  TECHNIQUE:  Multidetector CT imaging of the abdomen and pelvis was performed using the standard protocol following bolus administration of intravenous contrast.  CONTRAST:  46mL OMNIPAQUE IOHEXOL 300 MG/ML SOLN, 175mL OMNIPAQUE IOHEXOL 300 MG/ML SOLN  COMPARISON:  10/09/2012  FINDINGS: Sagittal images of the spine shows significant disc space flattening with vacuum disc phenomenon at L4-L5 and L5-S1 level.  Question Schmorl's node deformity upper endplate of L5 vertebral body. The lung bases are unremarkable. There are streak artifacts from patient's large body habitus.  There is mild hepatic fatty infiltration. No focal hepatic mass. No calcified gallstones are noted within gallbladder. The pancreas, spleen and adrenal glands are unremarkable. Kidneys are symmetrical in size and enhancement.  No aortic aneurysm. No small bowel obstruction. Normal appendix. No pericecal inflammation. Moderate stool are noted in right colon transverse colon and descending colon.  Colonic diverticula are noted in left colon.  In axial image 82 there is abnormal mild thickening of proximal sigmoid colon wall in left lower quadrant just above to urinary bladder. There is mild stranding of pericolonic fat. This is confirmed in coronal image 46. Findings are highly suspicious for acute diverticulitis. There is no diverticular abscess. No mesenteric abscess.  Minimal retroflexed uterus.  The urinary bladder is unremarkable.  IMPRESSION: 1. Multiple sigmoid colon diverticula. There is mild thickening of the inferior wall of proximal sigmoid colon just above and lateral to urinary bladder. Mild stranding of pericolonic fat. Findings are highly suspicious for acute diverticulitis. No diverticular abscess is noted. No mesenteric abscess. Best seen in coronal image 47 2. Normal appendix.  No pericecal inflammation. 3. Moderate stool noted in right colon transverse colon and descending colon. 4. No small bowel obstruction. 5. No hydronephrosis or  hydroureter. 6. Degenerative changes lumbar spine.   Electronically Signed   By: Lahoma Crocker M.D.   On: 04/27/2014 13:40     CBC  Recent Labs Lab 04/27/14 1038 04/28/14 0556  WBC 11.0* 8.6  HGB 11.1* 9.9*  HCT 34.7* 31.6*  PLT 265 238  MCV 85.7 85.9  MCH 27.4 26.9  MCHC 32.0 31.3  RDW 14.3 14.5  LYMPHSABS 3.1  --   MONOABS 0.8  --   EOSABS 0.0  --   BASOSABS 0.0  --     Chemistries   Recent Labs Lab 04/27/14 1038 04/28/14 0556  NA 137 138  K 4.0 3.7  CL 108 108  CO2 23 24  GLUCOSE 101* 93  BUN 17 9  CREATININE 0.88 0.84  CALCIUM 8.6 8.5  AST  --  14  ALT  --  16  ALKPHOS  --  56  BILITOT  --  0.2*   ------------------------------------------------------------------------------------------------------------------ estimated creatinine clearance is 149.4 mL/min (by C-G formula based on Cr of 0.84). ------------------------------------------------------------------------------------------------------------------  Recent Labs  04/27/14 2042  HGBA1C 5.9*   ------------------------------------------------------------------------------------------------------------------  Recent Labs  04/28/14 0556  CHOL 132  HDL 46  LDLCALC 74  TRIG 62  CHOLHDL 2.9   ------------------------------------------------------------------------------------------------------------------  Recent Labs  04/27/14 1038  TSH 2.055   ------------------------------------------------------------------------------------------------------------------ No results for input(s): VITAMINB12, FOLATE, FERRITIN, TIBC, IRON, RETICCTPCT in the last 72 hours.  Coagulation profile No results for input(s): INR, PROTIME in the last 168 hours.  No results for input(s): DDIMER in the last 72 hours.  Cardiac Enzymes No results for input(s): CKMB, TROPONINI, MYOGLOBIN in the last 168 hours.  Invalid input(s):  CK ------------------------------------------------------------------------------------------------------------------ Invalid input(s): POCBNP     Time Spent in minutes   35   Constance Hackenberg K M.D on 04/29/2014 at 8:30 AM  Between 7am to 7pm - Pager - [phone]  After 7pm go to [email or web address] - password [facility]  [facility]   Office  [phone]

## 2014-04-29 NOTE — Progress Notes (Signed)
Subjective: Doing fairly well today. Continues with abdominal pain. Still on clear liquids, had some nausea and worsening abdominal pain this morning after clear liquid breakfast which was adequately controlled with pain and nausea medication currently ordered. Has not had recurrence since this morning. Has not had a bowel movement in the past 24 hours. Denies any overt GI bleeding. No repeat CBC done this morning, is ordered for tomorrow morning. Is continuing to have some blood with urination.  Objective: Vital signs in last 24 hours: Temp:  [98.2 F (36.8 C)-99 F (37.2 C)] 98.3 F (36.8 C) (03/04 0616) Pulse Rate:  [64-79] 64 (03/04 0616) Resp:  [18] 18 (03/04 0616) BP: (107-122)/(58-67) 111/67 mmHg (03/04 0616) SpO2:  [96 %-98 %] 98 % (03/04 0616) Last BM Date: 04/26/14 General:   Alert and oriented, pleasant  Heart:  S1, S2 present, no murmurs noted.  Lungs: Clear to auscultation bilaterally, without wheezing, rales, or rhonchi.  Abdomen:  Obese abdomen, bowel sounds hypoactive but present, soft, non-distended. TTP lower abdomen. No HSM or hernias noted. No rebound or guarding. No masses appreciated  Neurologic:  Alert and  oriented x4;  grossly normal neurologically. Skin:  Warm and dry, intact without significant lesions.  Psych:  Alert and cooperative. Normal mood and affect.  Intake/Output from previous day: 03/03 0701 - 03/04 0700 In: 1638.8 [P.O.:720; I.V.:618.8; IV Piggyback:300] Out: -  Intake/Output this shift:    Lab Results:  Recent Labs  04/27/14 1038 04/28/14 0556  WBC 11.0* 8.6  HGB 11.1* 9.9*  HCT 34.7* 31.6*  PLT 265 238   BMET  Recent Labs  04/27/14 1038 04/28/14 0556  NA 137 138  K 4.0 3.7  CL 108 108  CO2 23 24  GLUCOSE 101* 93  BUN 17 9  CREATININE 0.88 0.84  CALCIUM 8.6 8.5   LFT  Recent Labs  04/28/14 0556  PROT 6.9  ALBUMIN 3.2*  AST 14  ALT 16  ALKPHOS 56  BILITOT 0.2*   PT/INR No results for input(s): LABPROT,  INR in the last 72 hours. Hepatitis Panel No results for input(s): HEPBSAG, HCVAB, HEPAIGM, HEPBIGM in the last 72 hours.   Studies/Results: Ct Abdomen Pelvis W Contrast  04/27/2014   CLINICAL DATA:  Left suprapubic pain  EXAM: CT ABDOMEN AND PELVIS WITH CONTRAST  TECHNIQUE: Multidetector CT imaging of the abdomen and pelvis was performed using the standard protocol following bolus administration of intravenous contrast.  CONTRAST:  62mL OMNIPAQUE IOHEXOL 300 MG/ML SOLN, 161mL OMNIPAQUE IOHEXOL 300 MG/ML SOLN  COMPARISON:  10/09/2012  FINDINGS: Sagittal images of the spine shows significant disc space flattening with vacuum disc phenomenon at L4-L5 and L5-S1 level.  Question Schmorl's node deformity upper endplate of L5 vertebral body. The lung bases are unremarkable. There are streak artifacts from patient's large body habitus.  There is mild hepatic fatty infiltration. No focal hepatic mass. No calcified gallstones are noted within gallbladder. The pancreas, spleen and adrenal glands are unremarkable. Kidneys are symmetrical in size and enhancement.  No aortic aneurysm. No small bowel obstruction. Normal appendix. No pericecal inflammation. Moderate stool are noted in right colon transverse colon and descending colon.  Colonic diverticula are noted in left colon.  In axial image 82 there is abnormal mild thickening of proximal sigmoid colon wall in left lower quadrant just above to urinary bladder. There is mild stranding of pericolonic fat. This is confirmed in coronal image 46. Findings are highly suspicious for acute diverticulitis. There is no diverticular  abscess. No mesenteric abscess.  Minimal retroflexed uterus.  The urinary bladder is unremarkable.  IMPRESSION: 1. Multiple sigmoid colon diverticula. There is mild thickening of the inferior wall of proximal sigmoid colon just above and lateral to urinary bladder. Mild stranding of pericolonic fat. Findings are highly suspicious for acute  diverticulitis. No diverticular abscess is noted. No mesenteric abscess. Best seen in coronal image 47 2. Normal appendix.  No pericecal inflammation. 3. Moderate stool noted in right colon transverse colon and descending colon. 4. No small bowel obstruction. 5. No hydronephrosis or hydroureter. 6. Degenerative changes lumbar spine.   Electronically Signed   By: Lahoma Crocker M.D.   On: 04/27/2014 13:40    Assessment: 34 year old lady with acute onset lower abdominal pain, CT findings highly suspicious for acute uncomplicated diverticulitis. This is her first episode. Currently on IV Cipro and IV Flagyl. Continued abdominal pain. Is requiring less morphine today (2 doses, last dose 0630 this morning.) Was on low residue diet yesterday with substantial symptoms after eating dinner so it was changed to clear liquids. Tolerating clear liquids fairly althugh had episodic increase in abdominal pain and nausea once this morning. Some decline in H/H upon admission although reports recent heavy menses likely contributing factor to anemia. Continued blood with urination.   Plan: 1. Continue antibiotics as ordered 2. Will keep on clear liquids today and reassess for tolerance tomorrow with possible advancement             depending on patient progress. 3. Continued pain and nausea management as needed. 4. Check CBC tomorrow to follow-up on slight drop in H/H 5. Monitor for any GI bleed 6. Plan for outpatient GI follow-up after hospitalization    Walden Field, AGNP-C Adult & Gerontological Nurse Practitioner [facility]     LOS: 2 days    04/29/2014, 12:17 PM

## 2014-04-30 LAB — CBC
HCT: 32.6 % — ABNORMAL LOW (ref 36.0–46.0)
HEMOGLOBIN: 10.1 g/dL — AB (ref 12.0–15.0)
MCH: 26.8 pg (ref 26.0–34.0)
MCHC: 31 g/dL (ref 30.0–36.0)
MCV: 86.5 fL (ref 78.0–100.0)
Platelets: 247 10*3/uL (ref 150–400)
RBC: 3.77 MIL/uL — ABNORMAL LOW (ref 3.87–5.11)
RDW: 14.6 % (ref 11.5–15.5)
WBC: 6.8 10*3/uL (ref 4.0–10.5)

## 2014-04-30 MED ORDER — CIPROFLOXACIN HCL 250 MG PO TABS: 500.0000 mg | ORAL_TABLET | Freq: Two times a day (BID) | ORAL | Status: DC

## 2014-04-30 MED ORDER — POLYETHYLENE GLYCOL 3350 17 G PO PACK
17.0000 g | PACK | Freq: Two times a day (BID) | ORAL | Status: DC
  Administered 2014-04-30 – 2014-05-01 (×3): 17 g via ORAL
  Filled 2014-04-30 (×3): qty 1

## 2014-04-30 MED ORDER — AMOXICILLIN-POT CLAVULANATE 500-125 MG PO TABS
1.0000 | ORAL_TABLET | Freq: Two times a day (BID) | ORAL | Status: DC
  Administered 2014-04-30 – 2014-05-01 (×2): 500 mg via ORAL
  Filled 2014-04-30 (×2): qty 1

## 2014-04-30 MED ORDER — METRONIDAZOLE 500 MG PO TABS: 500.0000 mg | ORAL_TABLET | Freq: Three times a day (TID) | ORAL | Status: DC

## 2014-04-30 MED ORDER — BISACODYL 10 MG RE SUPP
10.0000 mg | Freq: Every day | RECTAL | Status: DC
  Filled 2014-04-30: qty 1

## 2014-04-30 NOTE — Progress Notes (Signed)
Patient ambulated around unit x2.  Tolerated well.

## 2014-04-30 NOTE — Progress Notes (Addendum)
Patient ID: Marilyn Rivera, female   DOB: [birth year], 34 y.o.   MRN: [id number]   Assessment/Plan: ADMITTED WITH UNCOMPLICATED DIVERTICULITIS. CLINICALLY IMPROVED. DOESN'T WANT RECTAL SUPPOSITORIES. C/O NAUSEA WHICH MAY BE DUE TO FLAGYL.  PLAN: 1. D/C CIP/FLAG. CHANGE TO AUGMENTIN 500 MG BID TO COMPLETE ABX FOR 10 DAYS 2. OPV IN 4 WEEKS WITH DR. FIELDS. TCS AFTER NEXT VISIT 3. D/C RECTAL SUPPOSITORIES. CONTINUE MIRALAX   Subjective: Since I last evaluated the patient FEELING NAUSEATED. SX BETTER WITH ZOFRAN BUT GIVEN ZOFRAN AFTER. SML CALIBER STOOLS.  Objective: Vital signs in last 24 hours: Filed Vitals:   04/30/14 0640  BP: 112/91  Pulse: 66  Temp: 98.4 F (36.9 C)  Resp: 20     General appearance: alert, cooperative and no distress Resp: clear to auscultation bilaterally Cardio: regular rate and rhythm GI: soft, non-tender; bowel sounds normal; no masses,  no organomegaly  Lab Results: WBC 6.8 Hb 10.1 PLT 247  Studies/Results: No results found.  Medications: I have reviewed the patient's current medications.   LOS: 5 days   Barney Drain 08/05/2013, 2:23 PM

## 2014-04-30 NOTE — Progress Notes (Signed)
Patient Demographics  Marilyn Rivera, is a 34 y.o. female, DOB - May 06, 1980, [id number]  Admit date - 04/27/2014   Admitting Physician Waldemar Dickens, MD  Outpatient Primary MD for the patient is No PCP Per Patient  LOS - 3   Chief Complaint  Patient presents with  . Abdominal Pain      Brief summary  34 year old African-American female with history of morbid obesity admitted for abdominal pain due to first episode of acute diverticulitis which was mild in nature, placed on IV Cipro Flagyl, seen by GI. Much improved. Wishes to stay one more day. Can be discharged on Cipro Flagyl oral for 7 more days. Outpatient GI follow-up.    Subjective:   [name] today has, No headache, No chest pain, mild abdominal pain - No Nausea, No new weakness tingling or numbness, No Cough - SOB.   Assessment & Plan    1. Acute diverticulitis. Continue on soft diet, improved stop IV fluids, switch to PO Cipro Flagyl, afebrile with no leukocytosis, still complaining of some pain and wants to stay 1 more day. GI consulted, will like to follow in the office in a few weeks. Likely home in am. Initiated on bowel regimen to avoid constipation, continue supportive care.      2. Morbid obesity. Follow with PCP for weight loss.    3. Possible UTI. On Cipro, clinically treated.     Code Status: Full  Family Communication: husband  Disposition Plan: Home    Procedures  CT Abd Pelvis   Consults  GI   Medications  Scheduled Meds: . bisacodyl  10 mg Rectal Daily  . ciprofloxacin  400 mg Intravenous Q12H  . docusate sodium  200 mg Oral BID  . heparin  5,000 Units Subcutaneous 3 times per day  . metronidazole  500 mg Intravenous Q8H  . ondansetron  4 mg Oral TID AC  . phenazopyridine  100 mg Oral  TID WC  . polyethylene glycol  17 g Oral BID   Continuous Infusions:   PRN Meds:.acetaminophen **OR** [DISCONTINUED] acetaminophen, morphine injection, [DISCONTINUED] ondansetron **OR** ondansetron (ZOFRAN) IV  DVT Prophylaxis   Heparin   Lab Results  Component Value Date   PLT 247 04/30/2014    Antibiotics     Anti-infectives    Start     Dose/Rate Route Frequency Ordered Stop   04/27/14 2200  metroNIDAZOLE (FLAGYL) IVPB 500 mg     500 mg 100 mL/hr over 60 Minutes Intravenous Every 8 hours 04/27/14 1559     04/27/14 1600  ciprofloxacin (CIPRO) IVPB 400 mg     400 mg 200 mL/hr over 60 Minutes Intravenous Every 12 hours 04/27/14 1600     04/27/14 1400  ciprofloxacin (CIPRO) IVPB 400 mg  Status:  Discontinued     400 mg 200 mL/hr over 60 Minutes Intravenous  Once 04/27/14 1357 04/27/14 1600   04/27/14 1400  metroNIDAZOLE (FLAGYL) IVPB 500 mg     500 mg 100 mL/hr over 60 Minutes Intravenous  Once 04/27/14 1357 04/27/14 1620          Objective:   Filed Vitals:   04/28/14 2141 04/29/14 0616 04/29/14 1455 04/30/14 0640  BP: 122/67 111/67 107/58 112/91  Pulse: 76 64  72 66  Temp: 99 F (37.2 C) 98.3 F (36.8 C) 98.4 F (36.9 C) 98.4 F (36.9 C)  TempSrc: Oral Oral Oral Oral  Resp: 18 18 18 20   Height:      Weight:      SpO2: 96% 98% 98% 98%    Wt Readings from Last 3 Encounters:  04/27/14 149.687 kg (330 lb)  03/15/13 151.048 kg (333 lb)  10/08/12 151.048 kg (333 lb)     Intake/Output Summary (Last 24 hours) at 04/30/14 0920 Last data filed at 04/29/14 1815  Gross per 24 hour  Intake    720 ml  Output      0 ml  Net    720 ml     Physical Exam  Awake Alert, Oriented X 3, No new F.N deficits, Normal affect [place].AT,PERRAL Supple Neck,No JVD, No cervical lymphadenopathy appriciated.  Symmetrical Chest wall movement, Good air movement bilaterally, CTAB RRR,No Gallops,Rubs or new Murmurs, No Parasternal Heave +ve B.Sounds, Abd Soft, mild lower quadrant  tenderness, No organomegaly appriciated, No rebound - guarding or rigidity. No Cyanosis, Clubbing or edema, No new Rash or bruise      Data Review   Micro Results No results found for this or any previous visit (from the past 240 hour(s)).  Radiology Reports Ct Abdomen Pelvis W Contrast  04/27/2014   CLINICAL DATA:  Left suprapubic pain  EXAM: CT ABDOMEN AND PELVIS WITH CONTRAST  TECHNIQUE: Multidetector CT imaging of the abdomen and pelvis was performed using the standard protocol following bolus administration of intravenous contrast.  CONTRAST:  62mL OMNIPAQUE IOHEXOL 300 MG/ML SOLN, 151mL OMNIPAQUE IOHEXOL 300 MG/ML SOLN  COMPARISON:  10/09/2012  FINDINGS: Sagittal images of the spine shows significant disc space flattening with vacuum disc phenomenon at L4-L5 and L5-S1 level.  Question Schmorl's node deformity upper endplate of L5 vertebral body. The lung bases are unremarkable. There are streak artifacts from patient's large body habitus.  There is mild hepatic fatty infiltration. No focal hepatic mass. No calcified gallstones are noted within gallbladder. The pancreas, spleen and adrenal glands are unremarkable. Kidneys are symmetrical in size and enhancement.  No aortic aneurysm. No small bowel obstruction. Normal appendix. No pericecal inflammation. Moderate stool are noted in right colon transverse colon and descending colon.  Colonic diverticula are noted in left colon.  In axial image 82 there is abnormal mild thickening of proximal sigmoid colon wall in left lower quadrant just above to urinary bladder. There is mild stranding of pericolonic fat. This is confirmed in coronal image 46. Findings are highly suspicious for acute diverticulitis. There is no diverticular abscess. No mesenteric abscess.  Minimal retroflexed uterus.  The urinary bladder is unremarkable.  IMPRESSION: 1. Multiple sigmoid colon diverticula. There is mild thickening of the inferior wall of proximal sigmoid colon just  above and lateral to urinary bladder. Mild stranding of pericolonic fat. Findings are highly suspicious for acute diverticulitis. No diverticular abscess is noted. No mesenteric abscess. Best seen in coronal image 47 2. Normal appendix.  No pericecal inflammation. 3. Moderate stool noted in right colon transverse colon and descending colon. 4. No small bowel obstruction. 5. No hydronephrosis or hydroureter. 6. Degenerative changes lumbar spine.   Electronically Signed   By: Lahoma Crocker M.D.   On: 04/27/2014 13:40     CBC  Recent Labs Lab 04/27/14 1038 04/28/14 0556 04/30/14 0637  WBC 11.0* 8.6 6.8  HGB 11.1* 9.9* 10.1*  HCT 34.7* 31.6* 32.6*  PLT 265 238 247  MCV 85.7 85.9 86.5  MCH 27.4 26.9 26.8  MCHC 32.0 31.3 31.0  RDW 14.3 14.5 14.6  LYMPHSABS 3.1  --   --   MONOABS 0.8  --   --   EOSABS 0.0  --   --   BASOSABS 0.0  --   --     Chemistries   Recent Labs Lab 04/27/14 1038 04/28/14 0556  NA 137 138  K 4.0 3.7  CL 108 108  CO2 23 24  GLUCOSE 101* 93  BUN 17 9  CREATININE 0.88 0.84  CALCIUM 8.6 8.5  AST  --  14  ALT  --  16  ALKPHOS  --  56  BILITOT  --  0.2*   ------------------------------------------------------------------------------------------------------------------ estimated creatinine clearance is 149.4 mL/min (by C-G formula based on Cr of 0.84). ------------------------------------------------------------------------------------------------------------------  Recent Labs  04/27/14 2042  HGBA1C 5.9*   ------------------------------------------------------------------------------------------------------------------  Recent Labs  04/28/14 0556  CHOL 132  HDL 46  LDLCALC 74  TRIG 62  CHOLHDL 2.9   ------------------------------------------------------------------------------------------------------------------  Recent Labs  04/27/14 1038  TSH 2.055    ------------------------------------------------------------------------------------------------------------------ No results for input(s): VITAMINB12, FOLATE, FERRITIN, TIBC, IRON, RETICCTPCT in the last 72 hours.  Coagulation profile No results for input(s): INR, PROTIME in the last 168 hours.  No results for input(s): DDIMER in the last 72 hours.  Cardiac Enzymes No results for input(s): CKMB, TROPONINI, MYOGLOBIN in the last 168 hours.  Invalid input(s): CK ------------------------------------------------------------------------------------------------------------------ Invalid input(s): POCBNP     Time Spent in minutes   35   Zienna Ahlin K M.D on 04/30/2014 at 9:20 AM  Between 7am to 7pm - Pager - [phone]  After 7pm go to [email or web address] - password [facility]  [facility]   Office  [phone]

## 2014-05-01 ENCOUNTER — Telehealth: Payer: Self-pay | Admitting: Gastroenterology

## 2014-05-01 DIAGNOSIS — K5712 Diverticulitis of small intestine without perforation or abscess without bleeding: Secondary | ICD-10-CM

## 2014-05-01 MED ORDER — METRONIDAZOLE 500 MG PO TABS
500.0000 mg | ORAL_TABLET | Freq: Three times a day (TID) | ORAL | Status: DC
  Filled 2014-05-01: qty 1

## 2014-05-01 MED ORDER — POLYETHYLENE GLYCOL 3350 17 G PO PACK
17.0000 g | PACK | ORAL | Status: DC
  Filled 2014-05-01: qty 1

## 2014-05-01 MED ORDER — OXYCODONE HCL 5 MG PO TABS: 5.0000 mg | ORAL_TABLET | ORAL | Status: DC | PRN

## 2014-05-01 MED ORDER — METRONIDAZOLE 500 MG PO TABS
500.0000 mg | ORAL_TABLET | Freq: Three times a day (TID) | ORAL | Status: DC
Start: 2014-05-01 — End: 2014-05-26

## 2014-05-01 MED ORDER — CIPROFLOXACIN HCL 250 MG PO TABS
500.0000 mg | ORAL_TABLET | Freq: Two times a day (BID) | ORAL | Status: DC
  Filled 2014-05-01: qty 2

## 2014-05-01 MED ORDER — BISACODYL 5 MG PO TBEC
10.0000 mg | DELAYED_RELEASE_TABLET | ORAL | Status: DC
  Filled 2014-05-01: qty 2

## 2014-05-01 MED ORDER — POLYETHYLENE GLYCOL 3350 17 G PO PACK
17.0000 g | PACK | ORAL | Status: AC
  Administered 2014-05-01 (×2): 17 g via ORAL
  Filled 2014-05-01 (×2): qty 1

## 2014-05-01 MED ORDER — CIPROFLOXACIN HCL 500 MG PO TABS
500.0000 mg | ORAL_TABLET | Freq: Two times a day (BID) | ORAL | Status: DC
Start: 2014-05-01 — End: 2014-05-26

## 2014-05-01 MED ORDER — POLYETHYLENE GLYCOL 3350 17 G PO PACK: 17.0000 g | PACK | Freq: Two times a day (BID) | ORAL | Status: DC

## 2014-05-01 NOTE — Telephone Encounter (Signed)
OPV IN 4 WEEKS E30 W/ EG OR SLF DIVERTICULITIS

## 2014-05-01 NOTE — Discharge Summary (Signed)
Physician Discharge Summary  Marilyn Rivera:[id number] DOB: 1981/02/20 DOA: 04/27/2014  PCP: No PCP Per Patient  Admit date: 04/27/2014 Discharge date: 05/01/2014  Time spent: 45 minutes  Recommendations for Outpatient Follow-up:  -Will be discharged home today. -Will follow up with Dr. Oneida Alar in 4 weeks.   Discharge Diagnoses:  Principal Problem:   Diverticulitis Active Problems:   Morbid obesity   UTI (lower urinary tract infection)   Intractable abdominal pain   Leukocytosis   Normocytic anemia   Discharge Condition: Stable and improved  Filed Weights   04/27/14 1013  Weight: 149.687 kg (330 lb)    History of present illness:  Marilyn Rivera is a 34 y.o. female  Abd pain. Started 2 days ago. Getting worse. Lower abdomen w/o radiation.constant. Stabbing pain. Daily soft BM. Associated w/ nausea.   Dysuria and frequency. Ongoing for 2 days. Voided 4x since coming to ED  Hospital Course:   1. Acute diverticulitis. Abdominal pain/n/v resolved. Seen by GI and ok to go home today. Will DC on 8 more days of cipro/flagyl. Instructed on low-residue diet.   2. Morbid obesity. Follow with PCP for weight loss.    3. Possible UTI. On Cipro, clinically treated. Unfortunately, urine cx not drawn on admission.  Procedures:  None   Consultations:  GI, Dr. Oneida Alar  Discharge Instructions  Discharge Instructions    Increase activity slowly    Complete by:  As directed             Medication List    STOP taking these medications        ibuprofen 800 MG tablet  Commonly known as:  ADVIL,MOTRIN      TAKE these medications        ciprofloxacin 500 MG tablet  Commonly known as:  CIPRO  Take 1 tablet (500 mg total) by mouth 2 (two) times daily.     metroNIDAZOLE 500 MG tablet  Commonly known as:  FLAGYL  Take 1 tablet (500 mg total) by mouth every 8 (eight) hours.     oxyCODONE 5 MG immediate release tablet  Commonly known as:  Oxy IR/ROXICODONE    Take 1 tablet (5 mg total) by mouth every 4 (four) hours as needed for severe pain.       No Known Allergies     Follow-up Information    Follow up with [facility] On 06/30/3873.   Specialty:  Occupational Therapy   Why:  at 1:00   Contact information:   [address] [place] [address] [phone]       Follow up with Barney Drain, MD. Schedule an appointment as soon as possible for a visit in 4 weeks.   Specialty:  Gastroenterology   Contact information:   [place] [address] [phone]        The results of significant diagnostics from this hospitalization (including imaging, microbiology, ancillary and laboratory) are listed below for reference.    Significant Diagnostic Studies: Ct Abdomen Pelvis W Contrast  04/27/2014   CLINICAL DATA:  Left suprapubic pain  EXAM: CT ABDOMEN AND PELVIS WITH CONTRAST  TECHNIQUE: Multidetector CT imaging of the abdomen and pelvis was performed using the standard protocol following bolus administration of intravenous contrast.  CONTRAST:  48mL OMNIPAQUE IOHEXOL 300 MG/ML SOLN, 187mL OMNIPAQUE IOHEXOL 300 MG/ML SOLN  COMPARISON:  10/09/2012  FINDINGS: Sagittal images of the spine shows significant disc space  flattening with vacuum disc phenomenon at L4-L5 and L5-S1 level.  Question Schmorl's node deformity upper endplate of L5 vertebral body. The lung bases are unremarkable. There are streak artifacts from patient's large body habitus.  There is mild hepatic fatty infiltration. No focal hepatic mass. No calcified gallstones are noted within gallbladder. The pancreas, spleen and adrenal glands are unremarkable. Kidneys are symmetrical in size and enhancement.  No aortic aneurysm. No small bowel obstruction. Normal appendix. No pericecal inflammation. Moderate stool are noted in right colon transverse colon and descending colon.  Colonic diverticula are noted in left colon.  In  axial image 82 there is abnormal mild thickening of proximal sigmoid colon wall in left lower quadrant just above to urinary bladder. There is mild stranding of pericolonic fat. This is confirmed in coronal image 46. Findings are highly suspicious for acute diverticulitis. There is no diverticular abscess. No mesenteric abscess.  Minimal retroflexed uterus.  The urinary bladder is unremarkable.  IMPRESSION: 1. Multiple sigmoid colon diverticula. There is mild thickening of the inferior wall of proximal sigmoid colon just above and lateral to urinary bladder. Mild stranding of pericolonic fat. Findings are highly suspicious for acute diverticulitis. No diverticular abscess is noted. No mesenteric abscess. Best seen in coronal image 47 2. Normal appendix.  No pericecal inflammation. 3. Moderate stool noted in right colon transverse colon and descending colon. 4. No small bowel obstruction. 5. No hydronephrosis or hydroureter. 6. Degenerative changes lumbar spine.   Electronically Signed   By: Lahoma Crocker M.D.   On: 04/27/2014 13:40    Microbiology: No results found for this or any previous visit (from the past 240 hour(s)).   Labs: Basic Metabolic Panel:  Recent Labs Lab 04/27/14 1038 04/28/14 0556  NA 137 138  K 4.0 3.7  CL 108 108  CO2 23 24  GLUCOSE 101* 93  BUN 17 9  CREATININE 0.88 0.84  CALCIUM 8.6 8.5   Liver Function Tests:  Recent Labs Lab 04/28/14 0556  AST 14  ALT 16  ALKPHOS 56  BILITOT 0.2*  PROT 6.9  ALBUMIN 3.2*    Recent Labs Lab 04/28/14 0556  LIPASE 40   No results for input(s): AMMONIA in the last 168 hours. CBC:  Recent Labs Lab 04/27/14 1038 04/28/14 0556 04/30/14 0637  WBC 11.0* 8.6 6.8  NEUTROABS 7.0  --   --   HGB 11.1* 9.9* 10.1*  HCT 34.7* 31.6* 32.6*  MCV 85.7 85.9 86.5  PLT 265 238 247   Cardiac Enzymes: No results for input(s): CKTOTAL, CKMB, CKMBINDEX, TROPONINI in the last 168 hours. BNP: BNP (last 3 results) No results for  input(s): BNP in the last 8760 hours.  ProBNP (last 3 results) No results for input(s): PROBNP in the last 8760 hours.  CBG: No results for input(s): GLUCAP in the last 168 hours.     SignedLelon Frohlich  [facility] Pager: [phone] 05/01/2014, 3:53 PM

## 2014-05-01 NOTE — Progress Notes (Addendum)
Patient ID: Marilyn Rivera, female   DOB: [birth year], 34 y.o.   MRN: [id number]   Assessment/Plan: ADMITTED WITH UNCOMPLICATED DIVERTICULITIS. CLINICALLY IMPROVED. WANTS TO CHANGE BACK TO CIPRO FLAGYL. NAUSEA A LITTLE WORSE TODAY. NO BM SINCE MAR 1. NAUSEA/ABD PAIN DUE TO MEDS, CONSTIPATION, AND DIVERTICULITIS. PT AFEBRILE AND HEMODYNAMICALLY STABLE.  PLAN: 1. OK TO D/C HOME. ZOFRAN AND PAIN MEDS PRN. MIRALAX Q1H x2, & DULCOLAX 10MG  PO x1. 2. CIP/FLAGYL TO COMPLETE ON MAR 13. 3. RETURN TO WORK MAR 11 4. OPV IN 4 WEEK WITH [facility].   Subjective: Since I last evaluated the patient FEELS LIKE SHE NEEDS TO HAVE A BM AND HAS NOT. MORE NAUSEA AND ABD PAIN AFTER SWITCHING TO AUGMENTIN.  Objective: Vital signs in last 24 hours: Filed Vitals:   05/01/14 0650  BP: 117/64  Pulse: 60  Temp: 98.4 F (36.9 C)  Resp: 18     General appearance: alert, cooperative and no distress Resp: clear to auscultation bilaterally Cardio: regular rate and rhythm GI: soft, non-tender; bowel sounds normal; no masses,  no organomegaly  Lab Results:  NONE Studies/Results: No results found.  Medications: I have reviewed the patient's current medications.   LOS: 5 days   Barney Drain 08/05/2013, 2:23 PM

## 2014-05-01 NOTE — Progress Notes (Signed)
Patient was discharged home today with husband.  Patient was given discharge instructions, prescriptions, and note for work.  Patient verbalized understanding with no complaints or concerns voiced at this time.  IV was removed with catheter intact, no bleeding or complications.  Patient left unit in stable condition by a staff member in a wheelchair.

## 2014-05-02 NOTE — Care Management Utilization Note (Signed)
UR completed 

## 2014-05-02 NOTE — Telephone Encounter (Signed)
APPOINTMENT MADE °

## 2014-05-11 ENCOUNTER — Telehealth: Payer: Self-pay

## 2014-05-11 NOTE — Telephone Encounter (Signed)
Pt called and said she has been on antibiotics ( Cipro and Flagyl). She started feeling dizzy and light headed after about 5 days on the medication. She has almost completed them, but wanted to know if these would have caused her symptoms of light headed and dizzy. She has also had quite a bit of nausea. Please advise!

## 2014-05-13 NOTE — Telephone Encounter (Signed)
Pt is aware.  

## 2014-05-13 NOTE — Telephone Encounter (Signed)
PLEASE CALL PT. THE FLAGYL MAY CAUSE DIZZINESS AND NAUSEA.

## 2014-05-23 ENCOUNTER — Other Ambulatory Visit: Payer: Self-pay

## 2014-05-23 ENCOUNTER — Telehealth: Payer: Self-pay | Admitting: Gastroenterology

## 2014-05-23 DIAGNOSIS — K5732 Diverticulitis of large intestine without perforation or abscess without bleeding: Secondary | ICD-10-CM

## 2014-05-23 DIAGNOSIS — R109 Unspecified abdominal pain: Secondary | ICD-10-CM

## 2014-05-23 NOTE — Telephone Encounter (Signed)
PT NEEDS CT ABD/PELVIS W/ IV AND ORAL CONTRAST  W/I 24-48 HRS DX: ABDOMINAL PAIN, UNCOMPLICATED DIVERTICULITIS Apr 28 2014.

## 2014-05-23 NOTE — Telephone Encounter (Signed)
Pt is scheduled for CT scan on 05/24/14 @ 10:00. Pt is aware of appointment and time and that she needs to pick up contrast

## 2014-05-23 NOTE — Telephone Encounter (Signed)
Pt has OV with SF on 4/14 at 3 and called today asking if she can be seen sooner than that. I told her that SF is here on Wednesdays and Thursday and with RMR being on vacation that will be affecting her schedule in the office because she'll be covering for him. She said that she can not deal with the pain and she has finished the antibiotics and wants SF only. Please advise if she needs to be seen sooner. [phone]

## 2014-05-23 NOTE — Telephone Encounter (Signed)
I called pt and she said she has had constant abdominal pain since Sat. She has a hx of diverticulitis. No urgent slots available for several days. OV with Laban Emperor, NP on 05/26/2014 at 8:00 AM. Pt said she has no insurance and is aware she will need to pay $25.00 at the office visit.  She is aware that Dr. Oneida Alar does not have anything available for awhile. She rated her abdominal pain at a 6 now. I told her if she got worse to go to the ED and she said that she would.

## 2014-05-23 NOTE — Telephone Encounter (Signed)
Sounds like she may need repeat imaging if pain is worsening in severity, agree with going to the ED if necessary.

## 2014-05-24 ENCOUNTER — Ambulatory Visit (HOSPITAL_COMMUNITY)
Admission: RE | Admit: 2014-05-24 | Discharge: 2014-05-24 | Disposition: A | Discharge status: Home / Self Care | Payer: MEDICAID | Source: Ambulatory Visit | Attending: Gastroenterology | Admitting: Gastroenterology

## 2014-05-24 DIAGNOSIS — K5732 Diverticulitis of large intestine without perforation or abscess without bleeding: Secondary | ICD-10-CM

## 2014-05-24 DIAGNOSIS — K5793 Diverticulitis of intestine, part unspecified, without perforation or abscess with bleeding: Secondary | ICD-10-CM | POA: Insufficient documentation

## 2014-05-24 DIAGNOSIS — R109 Unspecified abdominal pain: Secondary | ICD-10-CM

## 2014-05-24 LAB — POCT PREGNANCY, URINE: Preg Test, Ur: NEGATIVE

## 2014-05-24 MED ORDER — SODIUM CHLORIDE 0.9 % IJ SOLN
INTRAMUSCULAR | Status: AC
  Filled 2014-05-24: qty 750

## 2014-05-24 MED ORDER — SODIUM CHLORIDE 0.9 % IJ SOLN
INTRAMUSCULAR | Status: AC
  Filled 2014-05-24: qty 45

## 2014-05-24 MED ORDER — IOHEXOL 300 MG/ML  SOLN
125.0000 mL | Freq: Once | INTRAMUSCULAR | Status: AC | PRN
  Administered 2014-05-24: 120 mL via INTRAVENOUS

## 2014-05-26 ENCOUNTER — Ambulatory Visit (INDEPENDENT_AMBULATORY_CARE_PROVIDER_SITE_OTHER): Payer: Self-pay | Admitting: Gastroenterology

## 2014-05-26 ENCOUNTER — Other Ambulatory Visit: Payer: Self-pay

## 2014-05-26 ENCOUNTER — Encounter: Payer: Self-pay | Admitting: Gastroenterology

## 2014-05-26 VITALS — BP 125/72 | HR 65 | Temp 97.1°F | Ht 69.0 in | Wt 371.4 lb

## 2014-05-26 DIAGNOSIS — D649 Anemia, unspecified: Secondary | ICD-10-CM

## 2014-05-26 DIAGNOSIS — K5732 Diverticulitis of large intestine without perforation or abscess without bleeding: Secondary | ICD-10-CM

## 2014-05-26 DIAGNOSIS — R110 Nausea: Secondary | ICD-10-CM

## 2014-05-26 LAB — FERRITIN: Ferritin: 7 ng/mL — ABNORMAL LOW (ref 10–291)

## 2014-05-26 LAB — IRON: Iron: 20 ug/dL — ABNORMAL LOW (ref 42–145)

## 2014-05-26 MED ORDER — PANTOPRAZOLE SODIUM 40 MG PO TBEC: 40.0000 mg | DELAYED_RELEASE_TABLET | Freq: Every day | ORAL | Status: DC

## 2014-05-26 MED ORDER — ONDANSETRON HCL 4 MG PO TABS: 4.0000 mg | ORAL_TABLET | Freq: Three times a day (TID) | ORAL | Status: DC

## 2014-05-26 NOTE — Assessment & Plan Note (Signed)
34 year old female with history of uncomplicated sigmoid diverticulitis in early March, treated with Cipro and Flagyl. Repeat CT scan due to recurrent abdominal pain showed resolution of diverticulitis and was without acute findings. Question of distal ileum with wall-thickening but may be secondary to lack of distension or contraction. Now with intermittent low-volume hematochezia in the setting of recent loose stools. Loose stools now resolved. Will provide Anusol cream and recommend colonoscopy in mid April 2016. Doubt IBD. Hematochezia likely benign anorectal source. Colonoscopy recommended also due to history of diverticulitis.   Proceed with colonoscopy with Dr. Oneida Alar in the near future. The risks, benefits, and alternatives have been discussed in detail with the patient. They state understanding and desire to proceed.  Anusol cream BID

## 2014-05-26 NOTE — Assessment & Plan Note (Signed)
Likely multifactorial in the setting of heavy menses. Check iron, ferritin now. TCS/EGD as planned.

## 2014-05-26 NOTE — Assessment & Plan Note (Signed)
Persistent nausea in the setting of chronic, intermittent GERD and Ibuprofen use. Query gastritis, possible PUD. Start Protonix once daily, add Zofran, and proceed with EGD at time of colonoscopy.   Proceed with upper endoscopy in the near future with Dr. Oneida Alar. The risks, benefits, and alternatives have been discussed in detail with patient. They have stated understanding and desire to proceed.

## 2014-05-26 NOTE — Progress Notes (Signed)
Referring Provider: No ref. provider found Primary Care Physician:  No PCP Per Patient  Primary GI: Dr. Oneida Alar   Chief Complaint  Patient presents with  . Abdominal Pain    HPI:   Marilyn Rivera is a 34 y.o. female presenting today with a history of uncomplicated sigmoid diverticulitis, admitted to [facility] March 2016 and treated with Cipro and Flagyl. She called in a few days ago with persisting pain, and repeat CT on 3/29 showed resolution of sigmoid diverticulitis. There was mild apparent wall thickening of the distal ileum that could be secondary to lack of distension or contraction. Doubt IBD or inflammation.   States was non-stop nauseated. Everything she ate made her stomach hurt. 4 days ago had diarrhea with some low-volume hematochezia. Pain located lower abdomen and radiating up mid abdomen. Pain improved now. No further diarrhea. Normal baseline is 2-3 bowel movements per day. Lately now twice a day with some blood in stool. Nausea better today, no nausea today so far. Drinking clear gatorade. Feels like if she could just throw up, would feel better. No exacerbation of GERD. No dysphagia. No melena. Will take rounds of Prilosec intermittently, which calms down reflux symptoms. During visit started feeling nauseated.   Jan and Feb had heavy periods. Urine clear. Ibuprofen prn for knee.    Past Medical History  Diagnosis Date  . Kidney stones     Past Surgical History  Procedure Laterality Date  . Knee surgery    . Renal calculi removal Left 2010    Current Outpatient Prescriptions  Medication Sig Dispense Refill  . ibuprofen (ADVIL,MOTRIN) 200 MG tablet Take 200 mg by mouth every 6 (six) hours as needed.    Marland Kitchen oxyCODONE (OXY IR/ROXICODONE) 5 MG immediate release tablet Take 1 tablet (5 mg total) by mouth every 4 (four) hours as needed for severe pain. 15 tablet 0   No current facility-administered medications for this visit.    Allergies as of 05/26/2014  .  (No Known Allergies)    Family History  Problem Relation Age of Onset  . Heart failure Mother   . Cancer Other     mother's side of family, breast cancer  . Cancer Other     father's side of family, leukemia  . Hypertension Father   . Diabetes Other   . Hypertension Other   . Colon cancer Neg Hx     History   Social History  . Marital Status: Married    Spouse Name: N/A  . Number of Children: 0  . Years of Education: N/A   Occupational History  . [employer], coordinator for resource side    Social History Main Topics  . Smoking status: Never Smoker   . Smokeless tobacco: Never Used  . Alcohol Use: No  . Drug Use: No  . Sexual Activity: Yes    Birth Control/ Protection: None   Other Topics Concern  . None   Social History Narrative    Review of Systems: As mentioned in HPI.   Physical Exam: BP 125/72 mmHg  Pulse 65  Temp(Src) 97.1 F (36.2 C)  Ht 5\' 9"  (1.753 m)  Wt 371 lb 6.4 oz (168.466 kg)  BMI 54.82 kg/m2  LMP 03/29/2014 General:   Alert and oriented. No distress noted. Pleasant and cooperative.  Head:  Normocephalic and atraumatic. Eyes:  Conjuctiva clear without scleral icterus. Mouth:  Oral mucosa pink and moist. Good dentition. No lesions. Heart:  S1, S2 present without  murmurs, rubs, or gallops. Regular rate and rhythm. Abdomen:  +BS, soft, obese, mild TTP epigastric region and sore lower abdomen but without rebound, guarding, or HSM.  Extremities:  Without edema. Neurologic:  Alert and  oriented x4;  grossly normal neurologically. Skin:  Intact without significant lesions or rashes. Psych:  Alert and cooperative. Normal mood and affect.  Lab Results  Component Value Date   WBC 6.8 04/30/2014   HGB 10.1* 04/30/2014   HCT 32.6* 04/30/2014   MCV 86.5 04/30/2014   PLT 247 04/30/2014

## 2014-05-26 NOTE — Progress Notes (Signed)
REVIEWED. TCS APR 15 T [facility].

## 2014-05-26 NOTE — Progress Notes (Signed)
No pcp per patient 

## 2014-05-26 NOTE — Patient Instructions (Signed)
I have sent in Protonix to take once each morning, 30 minutes before breakfast. This is for reflux.   Also, I have sent in Zofran to take with meals and bedtime. This is for nausea.   We have scheduled you for a colonoscopy and upper endoscopy with Dr. Oneida Alar.   Please have blood work done when you have completed the [employer] assistance paperwork, so this may be covered.

## 2014-05-27 ENCOUNTER — Telehealth: Payer: Self-pay

## 2014-05-27 NOTE — Telephone Encounter (Signed)
Pt is calling back to see if she can get some samples of the medications. I told her that we did not have any samples of the two medications. I told her that AS is off until next week.Please advise

## 2014-05-27 NOTE — Telephone Encounter (Signed)
We do not typically carry samples of these. Even so the Protonix would likely be longer-term and not able to provide long-term samples. Based on her demographics she's 100% [employer] through 10/2014. Can we see if they can be covered under that?

## 2014-05-27 NOTE — Telephone Encounter (Signed)
Pt does not have insurance and she can not afford the medication that we called in for her. Please advise

## 2014-05-30 NOTE — Telephone Encounter (Signed)
Pt is going to fill out paper work to see if she can get help with a PPI since she does not have insurance. I gave a couple of samples to hold her over.

## 2014-05-30 NOTE — Telephone Encounter (Signed)
Noted  

## 2014-06-06 ENCOUNTER — Other Ambulatory Visit: Payer: Self-pay

## 2014-06-06 NOTE — Progress Notes (Signed)
Quick Note:  Patient with low ferritin and iron. TCS/EGD as planned. Don't start iron supplementation yet until after colonoscopy/EGD findings. ______

## 2014-06-07 ENCOUNTER — Telehealth: Payer: Self-pay | Admitting: Gastroenterology

## 2014-06-07 NOTE — Telephone Encounter (Signed)
Tried to call and line was busy.  

## 2014-06-07 NOTE — Telephone Encounter (Signed)
Pt is aware.  

## 2014-06-07 NOTE — Progress Notes (Signed)
Quick Note:  Pt is aware. ______ 

## 2014-06-07 NOTE — Telephone Encounter (Signed)
PLEASE CALL PT. HER CT SHOWED HER DIVERTICULITIS IS RESOLVED. SHE HAS HER TCS APR 15 WILL EXAMINE SMALL AND LARGE BOWEL.

## 2014-06-09 ENCOUNTER — Ambulatory Visit: Payer: Self-pay | Admitting: Gastroenterology

## 2014-06-10 ENCOUNTER — Ambulatory Visit (HOSPITAL_COMMUNITY)
Admission: RE | Admit: 2014-06-10 | Discharge: 2014-06-10 | Disposition: A | Discharge status: Home / Self Care | Payer: Self-pay | Source: Ambulatory Visit | Attending: Gastroenterology | Admitting: Gastroenterology

## 2014-06-10 ENCOUNTER — Telehealth: Payer: Self-pay | Admitting: Gastroenterology

## 2014-06-10 ENCOUNTER — Encounter (HOSPITAL_COMMUNITY)
Admission: RE | Disposition: A | Discharge status: Home / Self Care | Payer: Self-pay | Source: Ambulatory Visit | Attending: Gastroenterology

## 2014-06-10 ENCOUNTER — Encounter (HOSPITAL_COMMUNITY): Payer: Self-pay | Admitting: *Deleted

## 2014-06-10 DIAGNOSIS — R110 Nausea: Secondary | ICD-10-CM

## 2014-06-10 DIAGNOSIS — Z87442 Personal history of urinary calculi: Secondary | ICD-10-CM | POA: Insufficient documentation

## 2014-06-10 DIAGNOSIS — K449 Diaphragmatic hernia without obstruction or gangrene: Secondary | ICD-10-CM | POA: Insufficient documentation

## 2014-06-10 DIAGNOSIS — K5732 Diverticulitis of large intestine without perforation or abscess without bleeding: Secondary | ICD-10-CM | POA: Insufficient documentation

## 2014-06-10 DIAGNOSIS — K5730 Diverticulosis of large intestine without perforation or abscess without bleeding: Secondary | ICD-10-CM | POA: Insufficient documentation

## 2014-06-10 DIAGNOSIS — K648 Other hemorrhoids: Secondary | ICD-10-CM | POA: Insufficient documentation

## 2014-06-10 DIAGNOSIS — K2950 Unspecified chronic gastritis without bleeding: Secondary | ICD-10-CM | POA: Insufficient documentation

## 2014-06-10 DIAGNOSIS — K6389 Other specified diseases of intestine: Secondary | ICD-10-CM | POA: Insufficient documentation

## 2014-06-10 DIAGNOSIS — K2970 Gastritis, unspecified, without bleeding: Secondary | ICD-10-CM

## 2014-06-10 DIAGNOSIS — K219 Gastro-esophageal reflux disease without esophagitis: Secondary | ICD-10-CM | POA: Insufficient documentation

## 2014-06-10 HISTORY — PX: ESOPHAGOGASTRODUODENOSCOPY: SHX5428

## 2014-06-10 HISTORY — DX: Diverticulosis of intestine, part unspecified, without perforation or abscess without bleeding: K57.90

## 2014-06-10 HISTORY — PX: COLONOSCOPY: SHX5424

## 2014-06-10 HISTORY — DX: Gastro-esophageal reflux disease without esophagitis: K21.9

## 2014-06-10 SURGERY — COLONOSCOPY
Anesthesia: Moderate Sedation

## 2014-06-10 MED ORDER — LINACLOTIDE 145 MCG PO CAPS: ORAL_CAPSULE | ORAL | Status: DC

## 2014-06-10 MED ORDER — SODIUM CHLORIDE 0.9 % IV SOLN
INTRAVENOUS | Status: DC
  Administered 2014-06-10: 10:00:00 via INTRAVENOUS

## 2014-06-10 MED ORDER — MINERAL OIL PO OIL
TOPICAL_OIL | ORAL | Status: AC
  Filled 2014-06-10: qty 30

## 2014-06-10 MED ORDER — MEPERIDINE HCL 100 MG/ML IJ SOLN
INTRAMUSCULAR | Status: DC | PRN
  Administered 2014-06-10 (×5): 25 mg via INTRAVENOUS

## 2014-06-10 MED ORDER — MIDAZOLAM HCL 5 MG/5ML IJ SOLN
INTRAMUSCULAR | Status: AC
  Filled 2014-06-10: qty 10

## 2014-06-10 MED ORDER — MEPERIDINE HCL 100 MG/ML IJ SOLN
INTRAMUSCULAR | Status: AC
  Filled 2014-06-10: qty 2

## 2014-06-10 MED ORDER — MIDAZOLAM HCL 5 MG/5ML IJ SOLN
INTRAMUSCULAR | Status: DC | PRN
  Administered 2014-06-10: 1 mg via INTRAVENOUS
  Administered 2014-06-10: 2 mg via INTRAVENOUS
  Administered 2014-06-10: 1 mg via INTRAVENOUS
  Administered 2014-06-10: 2 mg via INTRAVENOUS
  Administered 2014-06-10 (×4): 1 mg via INTRAVENOUS

## 2014-06-10 MED ORDER — LIDOCAINE VISCOUS 2 % MT SOLN
OROMUCOSAL | Status: AC
  Filled 2014-06-10: qty 15

## 2014-06-10 MED ORDER — STERILE WATER FOR IRRIGATION IR SOLN
Status: DC | PRN
  Administered 2014-06-10: 10:00:00

## 2014-06-10 NOTE — Telephone Encounter (Signed)
SF called to say that patient would be stopping by to pick up a [employer] co pay card. CJ brought one up front and patient is aware.

## 2014-06-10 NOTE — Telephone Encounter (Signed)
Noted  

## 2014-06-10 NOTE — Discharge Instructions (Signed)
Your NAUSEA IS MOST LIKELY DUE TO to REFLUX & gastritis. YOUR UPPER ENDOSCOPY SHOWED A HIATAL HERNIA. You have internal hemorrhoids, WHICH CAUSE RECTAL BLEEDING. YOUR HAVE DIVERTICULOSIS IN YOUR LEFT COLON. I biopsied your stomach.    FOLLOW A HIGH FIBER/LOW FAT DIET. AVOID ITEMS THAT CAUSE BLOATING. SEE INFO BELOW.  CONTINUE YOUR WEIGHT LOSS EFFORTS. LOSE 10 TO 20 LBS. IT WILL HELP YOUR REFLUX BE BETTER CONTROLLED AND DECREASE YOUR RISK FOR COLON CANCER.  CONTINUE PROTONIX. TAKE 30 MINUTES PRIOR TO MEALS TWICE DAILY.  AVOID ITEMS THAT TRIGGER GASTRITIS. SEE INFO BELOW.  YOUR BIOPSY RESULTS WILL BE AVAILABLE IN MY CHART AFTER APR 19 AND MY OFFICE WILL CONTACT YOU IN 10-14 DAYS WITH YOUR RESULTS.   FOLLOW UP IN 4 MOS.  NEXT COLONOSCOPY at age 34.   ENDOSCOPY Care After Read the instructions outlined below and refer to this sheet in the next week. These discharge instructions provide you with general information on caring for yourself after you leave the hospital. While your treatment has been planned according to the most current medical practices available, unavoidable complications occasionally occur. If you have any problems or questions after discharge, call DR. Sharene Krikorian, [phone].  ACTIVITY  You may resume your regular activity, but move at a slower pace for the next 24 hours.   Take frequent rest periods for the next 24 hours.   Walking will help get rid of the air and reduce the bloated feeling in your belly (abdomen).   No driving for 24 hours (because of the medicine (anesthesia) used during the test).   You may shower.   Do not sign any important legal documents or operate any machinery for 24 hours (because of the anesthesia used during the test).    NUTRITION  Drink plenty of fluids.   You may resume your normal diet as instructed by your doctor.   Begin with a light meal and progress to your normal diet. Heavy or fried foods are harder to digest and may make  you feel sick to your stomach (nauseated).   Avoid alcoholic beverages for 24 hours or as instructed.    MEDICATIONS  You may resume your normal medications.   WHAT YOU CAN EXPECT TODAY  Some feelings of bloating in the abdomen.   Passage of more gas than usual.   Spotting of blood in your stool or on the toilet paper  .  IF YOU HAD POLYPS REMOVED DURING THE ENDOSCOPY:  Eat a soft diet IF YOU HAVE NAUSEA, BLOATING, ABDOMINAL PAIN, OR VOMITING.    FINDING OUT THE RESULTS OF YOUR TEST Not all test results are available during your visit. DR. Oneida Alar WILL CALL YOU WITHIN 14 DAYS OF YOUR PROCEDUE WITH YOUR RESULTS. Do not assume everything is normal if you have not heard from DR. Ladonte Verstraete, CALL HER OFFICE AT [phone].  SEEK IMMEDIATE MEDICAL ATTENTION AND CALL THE OFFICE: [phone] IF:  You have more than a spotting of blood in your stool.   Your belly is swollen (abdominal distention).   You are nauseated or vomiting.   You have a temperature over 101F.   You have abdominal pain or discomfort that is severe or gets worse throughout the day.  Gastritis  Gastritis is an inflammation (the body's way of reacting to injury and/or infection) of the stomach.It is often caused by bacterial (germ) infections. It can also be caused BY ASPIRIN, BC/GOODY POWDER'S, (IBUPROFEN) MOTRIN, OR ALEVE (NAPROXEN), chemicals (including alcohol), SPICY FOODS, and medications. This  illness may be associated with generalized malaise (feeling tired, not well), UPPER ABDOMINAL STOMACH cramps, and fever. One common bacterial cause of gastritis is an organism known as H. Pylori. This can be treated with antibiotics.     Hiatal Hernia A hiatal hernia occurs when a part of the stomach slides above the diaphragm. The diaphragm is the thin muscle separating the belly (abdomen) from the chest. A hiatal hernia can be something you are born with or develop over time. Hiatal hernias may allow stomach acid  to flow back into your esophagus, the tube which carries food from your mouth to your stomach. If this acid causes problems it is called GERD (gastro-esophageal reflux disease).   SYMPTOMS Common symptoms of GERD are heartburn (burning in your chest). This is worse when lying down or bending over. It may also cause belching and indigestion. Some of the things which make GERD worse are:  Increased weight pushes on stomach making acid rise more easily.   Smoking markedly increases acid production.   HOME CARE INSTRUCTIONS  Try to achieve and maintain an ideal body weight.   Avoid drinking alcoholic beverages.   DO NOT smokE.   Do not wear tight clothing around your chest or stomach.   Eat smaller meals and eat more frequently. This keeps your stomach from getting too full. Eat slowly.   Do not lie down for 2 or 3 hours after eating. Do not eat or drink anything 1 to 2 hours before going to bed.   Avoid caffeine beverages (colas, coffee, cocoa, tea), fatty foods, citrus fruits and all other foods and drinks that contain acid and that seem to increase the problems.   Avoid bending over, especially after eating OR STRAINING. Anything that increases the pressure in your belly increases the amount of acid that may be pushed up into your esophagus.    High-Fiber Diet A high-fiber diet changes your normal diet to include more whole grains, legumes, fruits, and vegetables. Changes in the diet involve replacing refined carbohydrates with unrefined foods. The calorie level of the diet is essentially unchanged. The Dietary Reference Intake (recommended amount) for adult males is 38 grams per day. For adult females, it is 25 grams per day. Pregnant and lactating women should consume 28 grams of fiber per day. Fiber is the intact part of a plant that is not broken down during digestion. Functional fiber is fiber that has been isolated from the plant to provide a beneficial effect in the  body. PURPOSE  Increase stool bulk.   Ease and regulate bowel movements.   Lower cholesterol.  INDICATIONS THAT YOU NEED MORE FIBER  Constipation and hemorrhoids.   Uncomplicated diverticulosis (intestine condition) and irritable bowel syndrome.   Weight management.   As a protective measure against hardening of the arteries (atherosclerosis), diabetes, and cancer.   GUIDELINES FOR INCREASING FIBER IN THE DIET  Start adding fiber to the diet slowly. A gradual increase of about 5 more grams (2 slices of whole-wheat bread, 2 servings of most fruits or vegetables, or 1 bowl of high-fiber cereal) per day is best. Too rapid an increase in fiber may result in constipation, flatulence, and bloating.   Drink enough water and fluids to keep your urine clear or pale yellow. Water, juice, or caffeine-free drinks are recommended. Not drinking enough fluid may cause constipation.   Eat a variety of high-fiber foods rather than one type of fiber.   Try to increase your intake of fiber through using  high-fiber foods rather than fiber pills or supplements that contain small amounts of fiber.   The goal is to change the types of food eaten. Do not supplement your present diet with high-fiber foods, but replace foods in your present diet.  INCLUDE A VARIETY OF FIBER SOURCES  Replace refined and processed grains with whole grains, canned fruits with fresh fruits, and incorporate other fiber sources. White rice, white breads, and most bakery goods contain little or no fiber.   Brown whole-grain rice, buckwheat oats, and many fruits and vegetables are all good sources of fiber. These include: broccoli, Brussels sprouts, cabbage, cauliflower, beets, sweet potatoes, white potatoes (skin on), carrots, tomatoes, eggplant, squash, berries, fresh fruits, and dried fruits.   Cereals appear to be the richest source of fiber. Cereal fiber is found in whole grains and bran. Bran is the fiber-rich outer coat of  cereal grain, which is largely removed in refining. In whole-grain cereals, the bran remains. In breakfast cereals, the largest amount of fiber is found in those with "bran" in their names. The fiber content is sometimes indicated on the label.   You may need to include additional fruits and vegetables each day.   In baking, for 1 cup white flour, you may use the following substitutions:   1 cup whole-wheat flour minus 2 tablespoons.   1/2 cup white flour plus 1/2 cup whole-wheat flour.   Low-Fat Diet BREADS, CEREALS, PASTA, RICE, DRIED PEAS, AND BEANS These products are high in carbohydrates and most are low in fat. Therefore, they can be increased in the diet as substitutes for fatty foods. They too, however, contain calories and should not be eaten in excess. Cereals can be eaten for snacks as well as for breakfast.  Include foods that contain fiber (fruits, vegetables, whole grains, and legumes). Research shows that fiber may lower blood cholesterol levels, especially the water-soluble fiber found in fruits, vegetables, oat products, and legumes. FRUITS AND VEGETABLES It is good to eat fruits and vegetables. Besides being sources of fiber, both are rich in vitamins and some minerals. They help you get the daily allowances of these nutrients. Fruits and vegetables can be used for snacks and desserts. MEATS Limit lean meat, chicken, [place], and fish to no more than 6 ounces per day. Beef, Pork, and Lamb Use lean cuts of beef, pork, and lamb. Lean cuts include:  Extra-lean ground beef.  Arm roast.  Sirloin tip.  Center-cut ham.  Round steak.  Loin chops.  Rump roast.  Tenderloin.  Trim all fat off the outside of meats before cooking. It is not necessary to severely decrease the intake of red meat, but lean choices should be made. Lean meat is rich in protein and contains a highly absorbable form of iron. Premenopausal women, in particular, should avoid reducing lean red meat because this  could increase the risk for low red blood cells (iron-deficiency anemia). The organ meats, such as liver, sweetbreads, kidneys, and brain are very rich in cholesterol. They should be limited. Chicken and [place] These are good sources of protein. The fat of poultry can be reduced by removing the skin and underlying fat layers before cooking. Chicken and [place] can be substituted for lean red meat in the diet. Poultry should not be fried or covered with high-fat sauces. Fish and Shellfish Fish is a good source of protein. Shellfish contain cholesterol, but they usually are low in saturated fatty acids. The preparation of fish is important. Like chicken and [place], they should  not be fried or covered with high-fat sauces. EGGS Egg whites contain no fat or cholesterol. They can be eaten often. Try 1 to 2 egg whites instead of whole eggs in recipes or use egg substitutes that do not contain yolk. MILK AND DAIRY PRODUCTS Use skim or 1% milk instead of 2% or whole milk. Decrease whole milk, natural, and processed cheeses. Use nonfat or low-fat (2%) cottage cheese or low-fat cheeses made from vegetable oils. Choose nonfat or low-fat (1 to 2%) yogurt. Experiment with evaporated skim milk in recipes that call for heavy cream. Substitute low-fat yogurt or low-fat cottage cheese for sour cream in dips and salad dressings. Have at least 2 servings of low-fat dairy products, such as 2 glasses of skim (or 1%) milk each day to help get your daily calcium intake.  FATS AND OILS Reduce the total intake of fats, especially saturated fat. Butterfat, lard, and beef fats are high in saturated fat and cholesterol. These should be avoided as much as possible. Vegetable fats do not contain cholesterol, but certain vegetable fats, such as coconut oil, palm oil, and palm kernel oil are very high in saturated fats. These should be limited. These fats are often used in bakery goods, processed foods, popcorn, oils, and nondairy  creamers. Vegetable shortenings and some peanut butters contain hydrogenated oils, which are also saturated fats. Read the labels on these foods and check for saturated vegetable oils. Unsaturated vegetable oils and fats do not raise blood cholesterol. However, they should be limited because they are fats and are high in calories. Total fat should still be limited to 30% of your daily caloric intake. Desirable liquid vegetable oils are corn oil, cottonseed oil, olive oil, canola oil, safflower oil, soybean oil, and sunflower oil. Peanut oil is not as good, but small amounts are acceptable. Buy a heart-healthy tub margarine that has no partially hydrogenated oils in the ingredients. Mayonnaise and salad dressings often are made from unsaturated fats, but they should also be limited because of their high calorie and fat content. Seeds, nuts, peanut butter, olives, and avocados are high in fat, but the fat is mainly the unsaturated type. These foods should be limited mainly to avoid excess calories and fat. OTHER EATING TIPS Snacks  Most sweets should be limited as snacks. They tend to be rich in calories and fats, and their caloric content outweighs their nutritional value. Some good choices in snacks are graham crackers, melba toast, soda crackers, bagels (no egg), English muffins, fruits, and vegetables. These snacks are preferable to snack crackers, [place] fries, and chips. Popcorn should be air-popped or cooked in small amounts of liquid vegetable oil. Desserts Eat fruit, low-fat yogurt, and fruit ices. AVOID pastries, cake, and cookies. Sherbet, angel food cake, gelatin dessert, frozen low-fat yogurt, or other frozen products that do not contain saturated fat (pure fruit juice bars, frozen ice pops) are also acceptable.  COOKING METHODS Choose those methods that use little or no fat. They include: Poaching.  Braising.  Steaming.  Grilling.  Baking.  Stir-frying.  Broiling.  Microwaving.  Foods  can be cooked in a nonstick pan without added fat, or use a nonfat cooking spray in regular cookware. Limit fried foods and avoid frying in saturated fat. Add moisture to lean meats by using water, broth, cooking wines, and other nonfat or low-fat sauces along with the cooking methods mentioned above. Soups and stews should be chilled after cooking. The fat that forms on top after a few hours in  the refrigerator should be skimmed off. When preparing meals, avoid using excess salt. Salt can contribute to raising blood pressure in some people. EATING AWAY FROM HOME Order entres, potatoes, and vegetables without sauces or butter. When meat exceeds the size of a deck of cards (3 to 4 ounces), the rest can be taken home for another meal. Choose vegetable or fruit salads and ask for low-calorie salad dressings to be served on the side. Use dressings sparingly. Limit high-fat toppings, such as bacon, crumbled eggs, cheese, sunflower seeds, and olives. Ask for heart-healthy tub margarine instead of butter.  Hemorrhoids Hemorrhoids are dilated (enlarged) veins around the rectum. Sometimes clots will form in the veins. This makes them swollen and painful. These are called thrombosed hemorrhoids. Causes of hemorrhoids include:  Constipation.   Straining to have a bowel movement.   HEAVY LIFTING  HOME CARE INSTRUCTIONS  Eat a well balanced diet and drink 6 to 8 glasses of water every day to avoid constipation. You may also use a bulk laxative.   Avoid straining to have bowel movements.   Keep anal area dry and clean.   Do not use a donut shaped pillow or sit on the toilet for long periods. This increases blood pooling and pain.   Move your bowels when your body has the urge; this will require less straining and will decrease pain and pressure.

## 2014-06-10 NOTE — Op Note (Signed)
[facility] [address] [place], [address]   ENDOSCOPY PROCEDURE REPORT  PATIENT: Marilyn Rivera, Marilyn Rivera  MR#: [id number] BIRTHDATE: 12-26-1980 , 33  yrs. old GENDER: female  ENDOSCOPIST: Danie Binder, MD REFERRED BY:  PROCEDURE DATE: 06/25/14 PROCEDURE:   EGD w/ biopsy  INDICATIONS:nausea.   dyspepsia. MEDICATIONS: Versed 1 mg IV and Demerol 25 mg IV TOPICAL ANESTHETIC:   Viscous Xylocaine ASA CLASS:  DESCRIPTION OF PROCEDURE:     Physical exam was performed.  Informed consent was obtained from the patient after explaining the benefits, risks, and alternatives to the procedure.  The patient was connected to the monitor and placed in the left lateral position.  Continuous oxygen was provided by nasal cannula and IV medicine administered through an indwelling cannula.  After administration of sedation, the patients esophagus was intubated and the EG-[id number] ([id number])  endoscope was advanced under direct visualization to the second portion of the duodenum.  The scope was removed slowly by carefully examining the color, texture, anatomy, and integrity of the mucosa on the way out.  The patient was recovered in endoscopy and discharged home in satisfactory condition.   ESOPHAGUS: The mucosa of the esophagus appeared normal.   STOMACH: A medium sized hiatal hernia was noted.   Mild non-erosive gastritis (inflammation) was found in the gastric antrum.  Multiple biopsies were performed using cold forceps.   DUODENUM: The duodenal mucosa showed no abnormalities in the bulb and 2nd part of the duodenum.  COMPLICATIONS: There were no immediate complications.  ENDOSCOPIC IMPRESSION: 1.   NAUSEA MOST LIKELY DUE TO GERD/GASTRITIS 2.   Medium sized hiatal hernia 3.   MILD Non-erosive gastritis  RECOMMENDATIONS: FOLLOW A HIGH FIBER/LOW FAT DIET. CONTINUE YOUR WEIGHT LOSS EFFORTS. CONTINUE PROTONIX.  TAKE 30 MINUTES PRIOR TO MEALS TWICE DAILY. AVOID ITEMS THAT  TRIGGER GASTRITIS. AWAIT BIOPSY. FOLLOW UP IN 4 MOS. NEXT COLONOSCOPY at age 30.  REPEAT EXAM: eSigned:  Danie Binder, MD 2014/06/25 4:00 PM   CPT CODES: ICD CODES:  The ICD and CPT codes recommended by this software are interpretations from the data that the clinical staff has captured with the software.  The verification of the translation of this report to the ICD and CPT codes and modifiers is the sole responsibility of the health care institution and practicing physician where this report was generated.  [place]. will not be held responsible for the validity of the ICD and CPT codes included on this report.  AMA assumes no liability for data contained or not contained herein. CPT is a Designer, television/film set of the [employer].

## 2014-06-10 NOTE — Op Note (Signed)
[facility] [place], [address]   COLONOSCOPY PROCEDURE REPORT  PATIENT: Marilyn Rivera, Marilyn Rivera  MR#: [id number] BIRTHDATE: Jan 27, 1981 , 33  yrs. old GENDER: female ENDOSCOPIST: Danie Binder, MD REFERRED BY: PROCEDURE DATE:  June 16, 2014 PROCEDURE:   Colonoscopy, diagnostic INDICATIONS:Recent episode of diverticulitis MAR 2016 and RECTAL BLEEDING. MEDICATIONS: Demerol 100 mg IV and Versed 9 mg IV  DESCRIPTION OF PROCEDURE:    Physical exam was performed.  Informed consent was obtained from the patient after explaining the benefits, risks, and alternatives to procedure.  The patient was connected to monitor and placed in left lateral position. Continuous oxygen was provided by nasal cannula and IV medicine administered through an indwelling cannula.  After administration of sedation and rectal exam, the patients rectum was intubated and the EC-[id number] ([id number])  colonoscope was advanced under direct visualization to the cecum.  The scope was removed slowly by carefully examining the color, texture, anatomy, and integrity mucosa on the way out.  The patient was recovered in endoscopy and discharged home in satisfactory condition.    COLON FINDINGS: The colon was redundant.  Manual abdominal counter-pressure was used to reach the cecum.  The patient was moved on to their back to reach the cecum, There was moderate diverticulosis noted in the sigmoid colon and descending colon with associated muscular hypertrophy, angulation and tortuosity.  , and Small internal hemorrhoids were found.  PREP QUALITY: good.  CECAL W/D TIME: 12       minutes COMPLICATIONS: PT AGITATED WHEN SCOPE PASING THROUGH AND WITHDRAWN FROM SIGMOID COLON.  ENDOSCOPIC IMPRESSION: 1.   The SIGMOID colon IS redundant 2.   Moderate diverticulosis IN the sigmoid colon and descending colon 3.   Small internal hemorrhoids  RECOMMENDATIONS: FOLLOW A HIGH FIBER/LOW FAT DIET. CONTINUE  YOUR WEIGHT LOSS EFFORTS. CONTINUE PROTONIX.  TAKE 30 MINUTES PRIOR TO MEALS TWICE DAILY. AVOID ITEMS THAT TRIGGER GASTRITIS. AWAIT BIOPSY. FOLLOW UP IN 4 MOS. NEXT COLONOSCOPY at age 9.     eSigned:  Danie Binder, MD 06-16-2014 4:04 PM    CPT CODES: ICD CODES:  The ICD and CPT codes recommended by this software are interpretations from the data that the clinical staff has captured with the software.  The verification of the translation of this report to the ICD and CPT codes and modifiers is the sole responsibility of the health care institution and practicing physician where this report was generated.  [employer]. will not be held responsible for the validity of the ICD and CPT codes included on this report.  AMA assumes no liability for data contained or not contained herein. CPT is a Designer, television/film set of the [employer].

## 2014-06-10 NOTE — H&P (Signed)
  Primary Care Physician:  No PCP Per Patient Primary Gastroenterologist:  Dr. Oneida Alar  Pre-Procedure History & Physical: HPI:  Marilyn Rivera is a 34 y.o. female here for BRBPR/DYSPEPSIA-PMHx: UNCOMPLICATED DIVERTICULITIS.  Past Medical History  Diagnosis Date  . Kidney stones   . GERD (gastroesophageal reflux disease)   . Diverticulosis     Past Surgical History  Procedure Laterality Date  . Knee surgery Right   . Renal calculi removal Left 2010  . Skin lesion excision      over right eyebrow due to wax being left above eye and it seeped down into pore    Prior to Admission medications   Medication Sig Start Date End Date Taking? Authorizing Provider  ibuprofen (ADVIL,MOTRIN) 200 MG tablet Take 200 mg by mouth every 6 (six) hours as needed for moderate pain or cramping.    Yes Historical Provider, MD  ondansetron (ZOFRAN) 4 MG tablet Take 1 tablet (4 mg total) by mouth 4 (four) times daily -  with meals and at bedtime. 05/26/14  Yes Orvil Feil, NP  oxyCODONE (OXY IR/ROXICODONE) 5 MG immediate release tablet Take 1 tablet (5 mg total) by mouth every 4 (four) hours as needed for severe pain. 05/01/14  Yes Erline Hau, MD  pantoprazole (PROTONIX) 40 MG tablet Take 1 tablet (40 mg total) by mouth daily. 30 minutes before breakfast 05/26/14  Yes Orvil Feil, NP    Allergies as of 05/26/2014  . (No Known Allergies)    Family History  Problem Relation Age of Onset  . Heart failure Mother   . Cancer Other     mother's side of family, breast cancer  . Cancer Other     father's side of family, leukemia  . Hypertension Father   . Diabetes Other   . Hypertension Other   . Colon cancer Neg Hx     History   Social History  . Marital Status: Married    Spouse Name: N/A  . Number of Children: 0  . Years of Education: N/A   Occupational History  . [employer], coordinator for resource side    Social History Main Topics  . Smoking status: Never Smoker   . Smokeless  tobacco: Never Used  . Alcohol Use: No  . Drug Use: No  . Sexual Activity: Yes    Birth Control/ Protection: None   Other Topics Concern  . Not on file   Social History Narrative    Review of Systems: See HPI, otherwise negative ROS   Physical Exam: BP 119/70 mmHg  Pulse 69  Temp(Src) 97.8 F (36.6 C) (Oral)  Resp 15  Ht 5' 10.5" (1.791 m)  Wt 371 lb (168.284 kg)  BMI 52.46 kg/m2  SpO2 96%  LMP  (Within Weeks) General:   Alert,  pleasant and cooperative in NAD Head:  Normocephalic and atraumatic. Neck:  Supple; Lungs:  Clear throughout to auscultation.    Heart:  Regular rate and rhythm. Abdomen:  Soft, nontender and nondistended. Normal bowel sounds, without guarding, and without rebound.   Neurologic:  Alert and  oriented x4;  grossly normal neurologically.  Impression/Plan:     BRBPR/DYSPEPSIA  PLAN: EGD/TCS TODAY

## 2014-06-13 ENCOUNTER — Encounter (HOSPITAL_COMMUNITY): Payer: Self-pay | Admitting: Gastroenterology

## 2014-06-13 NOTE — Telephone Encounter (Signed)
Pt called today asking for nexium samples. I could not find anywhere documented that pt was on nexium, only protonix. Spoke with Ginger, she said she gave pt nexium samples and dexilant patient assistance forms because we were out of dexilant. I spoke with AS- put 2 boxes of dexilant at the front desk for the pt to pick up and SS told pt to make sure she brought her patient assistance forms back when she came to pick it up. Pt stated she would.

## 2014-06-18 MED ORDER — CLARITHROMYCIN 500 MG PO TABS: ORAL_TABLET | ORAL | Status: DC

## 2014-06-18 MED ORDER — AMOXICILLIN 500 MG PO TABS: ORAL_TABLET | ORAL | Status: DC

## 2014-06-18 NOTE — Telephone Encounter (Addendum)
PLEASE CALL PT. Her stomach Bx showed H. Pylori infection. IT CAUSES NAUSEA. She needs AMOXICILLIN 500 mg 2 po BID for 10 days and Biaxin 500 mg po bid for 10 days. She needs PROTONIX 40 mg BID for 3 mos then 1 po daily. MEd side effects include NVD, abd pain, and metallic taste. OPV AUG 2016 E30 H PYLORI GASTRITIS, NAUSEA, GERD.

## 2014-06-18 NOTE — Addendum Note (Signed)
Addended by: Danie Binder on: 06/18/2014 05:39 PM   Modules accepted: Orders

## 2014-06-20 NOTE — Telephone Encounter (Signed)
OV made °

## 2014-06-21 ENCOUNTER — Ambulatory Visit: Payer: Self-pay | Admitting: Gastroenterology

## 2014-06-21 ENCOUNTER — Ambulatory Visit: Payer: Self-pay | Admitting: Nurse Practitioner

## 2014-06-21 NOTE — Telephone Encounter (Signed)
Noted  

## 2014-06-21 NOTE — Telephone Encounter (Signed)
Pt is aware. She asked for me to call the Rx's to [facility]. I called them in and the Amoxicillin and PPI would be $10.00 each. The Biaxin would be $100.00. I called pt back and she would like to know if we can do something else. Can we use [facility] Samples?

## 2014-06-21 NOTE — Telephone Encounter (Signed)
Pharmacist called back and can do the Biaxin for $30.00. Pt is aware the prescriptions will only cost her $50.00 for one month and she said she can work with that.

## 2014-08-03 ENCOUNTER — Other Ambulatory Visit: Payer: Self-pay

## 2014-08-04 MED ORDER — PANTOPRAZOLE SODIUM 40 MG PO TBEC: 40.0000 mg | DELAYED_RELEASE_TABLET | Freq: Every day | ORAL | Status: DC

## 2014-09-01 ENCOUNTER — Encounter (HOSPITAL_COMMUNITY): Payer: Self-pay | Admitting: Emergency Medicine

## 2014-09-01 ENCOUNTER — Emergency Department (HOSPITAL_COMMUNITY)
Admission: EM | Admit: 2014-09-01 | Discharge: 2014-09-02 | Disposition: A | Discharge status: Home / Self Care | Payer: Self-pay | Attending: Emergency Medicine | Admitting: Emergency Medicine

## 2014-09-01 DIAGNOSIS — K219 Gastro-esophageal reflux disease without esophagitis: Secondary | ICD-10-CM | POA: Insufficient documentation

## 2014-09-01 DIAGNOSIS — Z79899 Other long term (current) drug therapy: Secondary | ICD-10-CM | POA: Insufficient documentation

## 2014-09-01 DIAGNOSIS — Z3202 Encounter for pregnancy test, result negative: Secondary | ICD-10-CM | POA: Insufficient documentation

## 2014-09-01 DIAGNOSIS — K5732 Diverticulitis of large intestine without perforation or abscess without bleeding: Secondary | ICD-10-CM | POA: Insufficient documentation

## 2014-09-01 DIAGNOSIS — K5900 Constipation, unspecified: Secondary | ICD-10-CM | POA: Insufficient documentation

## 2014-09-01 DIAGNOSIS — Z87442 Personal history of urinary calculi: Secondary | ICD-10-CM | POA: Insufficient documentation

## 2014-09-01 LAB — COMPREHENSIVE METABOLIC PANEL
ALT: 18 U/L (ref 14–54)
AST: 17 U/L (ref 15–41)
Albumin: 3.6 g/dL (ref 3.5–5.0)
Alkaline Phosphatase: 72 U/L (ref 38–126)
Anion gap: 8 (ref 5–15)
BUN: 14 mg/dL (ref 6–20)
CALCIUM: 8.3 mg/dL — AB (ref 8.9–10.3)
CO2: 23 mmol/L (ref 22–32)
CREATININE: 0.85 mg/dL (ref 0.44–1.00)
Chloride: 107 mmol/L (ref 101–111)
GFR calc Af Amer: >60 mL/min (ref >60)
GLUCOSE: 101 mg/dL — AB (ref 65–99)
Potassium: 4 mmol/L (ref 3.5–5.1)
Sodium: 138 mmol/L (ref 135–145)
Total Bilirubin: 0.4 mg/dL (ref 0.3–1.2)
Total Protein: 7.2 g/dL (ref 6.5–8.1)

## 2014-09-01 LAB — URINALYSIS, ROUTINE W REFLEX MICROSCOPIC
BILIRUBIN URINE: NEGATIVE
GLUCOSE, UA: NEGATIVE mg/dL
KETONES UR: NEGATIVE mg/dL
Leukocytes, UA: NEGATIVE
Nitrite: NEGATIVE
PH: 6 (ref 5.0–8.0)
Protein, ur: NEGATIVE mg/dL
Specific Gravity, Urine: >1.03 — ABNORMAL HIGH (ref 1.005–1.030)
Urobilinogen, UA: 0.2 mg/dL (ref 0.0–1.0)

## 2014-09-01 LAB — PREGNANCY, URINE: Preg Test, Ur: NEGATIVE

## 2014-09-01 LAB — URINE MICROSCOPIC-ADD ON

## 2014-09-01 MED ORDER — HYDROMORPHONE HCL 1 MG/ML IJ SOLN
1.0000 mg | Freq: Once | INTRAMUSCULAR | Status: AC
  Administered 2014-09-01: 1 mg via INTRAVENOUS
  Filled 2014-09-01: qty 1

## 2014-09-01 MED ORDER — ONDANSETRON HCL 4 MG/2ML IJ SOLN
4.0000 mg | Freq: Once | INTRAMUSCULAR | Status: AC
  Administered 2014-09-01: 4 mg via INTRAVENOUS
  Filled 2014-09-01: qty 2

## 2014-09-01 MED ORDER — HYDROMORPHONE HCL 1 MG/ML IJ SOLN
1.0000 mg | Freq: Once | INTRAMUSCULAR | Status: AC
  Administered 2014-09-01: 1 mg via INTRAMUSCULAR
  Filled 2014-09-01: qty 1

## 2014-09-01 NOTE — ED Provider Notes (Signed)
CSN: [id number]     Arrival date & time 09/01/14  2059 History   This chart was scribed for  Ripley Fraise, MD by Altamease Oiler, ED Scribe. This patient was seen in room APA03/APA03 and the patient's care was started at 11:01 PM.    Chief Complaint  Patient presents with  . Abdominal Pain     The history is provided by the patient and the spouse. No language interpreter was used.    Marilyn Rivera is a 34 y.o. female with PMHx of GERD and diverticulitis who presents to the Emergency Department complaining of increasing lower abdominal pain with onset yesterday. She describes the pain as cramping and rates it 8/10 in severity.  The pain feels similar to previous episodes of diverticulitis. Associated nausea and constipation with no improvement after 2 stool softeners last night. She notes very small bowel movements with no blood. Pt denies fever, hematochezia, vaginal discharge, dysuria, and cough.  Past Medical History  Diagnosis Date  . Kidney stones   . GERD (gastroesophageal reflux disease)   . Diverticulosis    Past Surgical History  Procedure Laterality Date  . Knee surgery Right   . Renal calculi removal Left 2010  . Skin lesion excision      over right eyebrow due to wax being left above eye and it seeped down into pore  . Colonoscopy N/A 06/10/2014    Procedure: COLONOSCOPY;  Surgeon: Danie Binder, MD;  Location: [facility];  Service: Endoscopy;  Laterality: N/A;  1015  . Esophagogastroduodenoscopy N/A 06/10/2014    Procedure: ESOPHAGOGASTRODUODENOSCOPY (EGD);  Surgeon: Danie Binder, MD;  Location: [facility];  Service: Endoscopy;  Laterality: N/A;   Family History  Problem Relation Age of Onset  . Heart failure Mother   . Cancer Other     mother's side of family, breast cancer  . Cancer Other     father's side of family, leukemia  . Hypertension Father   . Diabetes Other   . Hypertension Other   . Colon cancer Neg Hx    History  Substance Use Topics   . Smoking status: Never Smoker   . Smokeless tobacco: Never Used  . Alcohol Use: No   OB History    No data available     Review of Systems  Constitutional: Negative for fever and chills.  Respiratory: Negative for cough.   Gastrointestinal: Positive for nausea, abdominal pain and constipation. Negative for vomiting and blood in stool.  Genitourinary: Negative for dysuria.  All other systems reviewed and are negative.   Allergies  Review of patient's allergies indicates no known allergies.  Home Medications   Prior to Admission medications   Medication Sig Start Date End Date Taking? Authorizing Provider  ondansetron (ZOFRAN) 4 MG tablet Take 1 tablet (4 mg total) by mouth 4 (four) times daily -  with meals and at bedtime. 05/26/14  Yes Orvil Feil, NP  pantoprazole (PROTONIX) 40 MG tablet Take 1 tablet (40 mg total) by mouth daily. 30 minutes before breakfast 08/04/14  Yes Carlis Stable, NP  amoxicillin (AMOXIL) 500 MG tablet 2 PO BID FOR 10 DAYS Patient not taking: Reported on 09/01/2014 06/18/14   Danie Binder, MD  clarithromycin (BIAXIN) 500 MG tablet 1 PO BID FOR 10 DAYS. Patient not taking: Reported on 09/01/2014 06/18/14   Danie Binder, MD  ibuprofen (ADVIL,MOTRIN) 200 MG tablet Take 400 mg by mouth every 6 (six) hours as needed for moderate  pain or cramping.     Historical Provider, MD  Linaclotide Rolan Lipa) 145 MCG CAPS capsule 1 PO 30 mins prior to your first meal Patient not taking: Reported on 09/01/2014 06/10/14   Danie Binder, MD  oxyCODONE (OXY IR/ROXICODONE) 5 MG immediate release tablet Take 1 tablet (5 mg total) by mouth every 4 (four) hours as needed for severe pain. Patient not taking: Reported on 09/01/2014 05/01/14   Erline Hau, MD   Triage Vitals: BP 160/93 mmHg  Pulse 83  Temp(Src) 97.9 F (36.6 C) (Oral)  Resp 19  SpO2 100%  LMP 08/20/2014  Physical Exam CONSTITUTIONAL: Well developed/well nourished HEAD: Normocephalic/atraumatic EYES:  EOMI/PERRL ENMT: Mucous membranes moist NECK: supple no meningeal signs SPINE/BACK:entire spine nontender CV: S1/S2 noted, no murmurs/rubs/gallops noted LUNGS: Lungs are clear to auscultation bilaterally, no apparent distress ABDOMEN: soft, moderate bilateral lower quadrant tenderness, no rebound or guarding, bowel sounds noted throughout abdomen GU:no cva tenderness NEURO: Pt is awake/alert/appropriate, moves all extremitiesx4.  No facial droop.   EXTREMITIES: pulses normal/equal, full ROM SKIN: warm, color normal PSYCH: no abnormalities of mood noted, alert and oriented to situation  ED Course  Procedures  DIAGNOSTIC STUDIES: Oxygen Saturation is 100% on RA, normal by my interpretation.    COORDINATION OF CARE: 11:07 PM Discussed treatment plan which includes lab work, IVF, and Dilaudid with pt at bedside and pt agreed to plan.  11:43 PM I re-evaluated the patient. Her abdominal pain has improved. I informed her that we are still waiting on the results of her lab work. 12:24 AM Pt improved Now has mild pain in LLQ She feels this is similar to prior episodes of diverticulitis We discussed possibility of avoiding CT imaging and starting ABX as she has had multiple CT scans, has known diverticulitis and is improved.  She would like to avoid CT imaging Will give abx here in the ED 1:58 AM Pain is now worsening Will obtain CT imaging to evaluate for any complications from diverticulitis 3:00 AM Pain improved CT shows early diverticulitis without complication Pt feels well for d/c home Cipro/flagyll ordered as pt has tolerated previously Labs Review Labs Reviewed  CBC WITH DIFFERENTIAL/PLATELET - Abnormal; Notable for the following:    WBC 10.8 (*)    MCH 25.2 (*)    RDW 18.2 (*)    Lymphs Abs 4.2 (*)    All other components within normal limits  COMPREHENSIVE METABOLIC PANEL - Abnormal; Notable for the following:    Glucose, Bld 101 (*)    Calcium 8.3 (*)    All other  components within normal limits  URINALYSIS, ROUTINE W REFLEX MICROSCOPIC (NOT AT [facility]) - Abnormal; Notable for the following:    Specific Gravity, Urine >1.030 (*)    Hgb urine dipstick SMALL (*)    All other components within normal limits  URINE MICROSCOPIC-ADD ON - Abnormal; Notable for the following:    Squamous Epithelial / LPF MANY (*)    Bacteria, UA MANY (*)    All other components within normal limits  PREGNANCY, URINE   Medications  HYDROmorphone (DILAUDID) injection 1 mg (1 mg Intramuscular Given 09/01/14 2309)  HYDROmorphone (DILAUDID) injection 1 mg (1 mg Intravenous Given 09/01/14 2357)  ondansetron (ZOFRAN) injection 4 mg (4 mg Intravenous Given 09/01/14 2357)  metroNIDAZOLE (FLAGYL) tablet 500 mg (500 mg Oral Given 09/02/14 0025)  ciprofloxacin (CIPRO) IVPB 400 mg (0 mg Intravenous Stopped 09/02/14 0131)  HYDROmorphone (DILAUDID) injection 1 mg (1 mg Intravenous Given 09/02/14  0207)  iohexol (OMNIPAQUE) 300 MG/ML solution 50 mL (50 mLs Oral Contrast Given 09/02/14 0225)  iohexol (OMNIPAQUE) 300 MG/ML solution 100 mL (100 mLs Intravenous Contrast Given 09/02/14 0225)     MDM   Final diagnoses:  Sigmoid diverticulitis    Nursing notes including past medical history and social history reviewed and considered in documentation Labs/vital reviewed myself and considered during evaluation Previous records reviewed and considered    I personally performed the services described in this documentation, which was scribed in my presence. The recorded information has been reviewed and is accurate.       Ripley Fraise, MD 09/02/14 [id number]

## 2014-09-01 NOTE — ED Notes (Signed)
Pt. Reports abdominal pain starting today. Pt. Denies vomiting or diarrhea.

## 2014-09-02 ENCOUNTER — Other Ambulatory Visit: Payer: Self-pay

## 2014-09-02 ENCOUNTER — Emergency Department (HOSPITAL_COMMUNITY): Payer: Self-pay

## 2014-09-02 LAB — CBC WITH DIFFERENTIAL/PLATELET
Basophils Absolute: 0.1 10*3/uL (ref 0.0–0.1)
Basophils Relative: 1 % (ref 0–1)
EOS ABS: 0.2 10*3/uL (ref 0.0–0.7)
Eosinophils Relative: 2 % (ref 0–5)
HCT: 37.9 % (ref 36.0–46.0)
Hemoglobin: 12 g/dL (ref 12.0–15.0)
Lymphocytes Relative: 38 % (ref 12–46)
Lymphs Abs: 4.2 10*3/uL — ABNORMAL HIGH (ref 0.7–4.0)
MCH: 25.2 pg — ABNORMAL LOW (ref 26.0–34.0)
MCHC: 31.7 g/dL (ref 30.0–36.0)
MCV: 79.5 fL (ref 78.0–100.0)
Monocytes Absolute: 0.9 10*3/uL (ref 0.1–1.0)
Monocytes Relative: 9 % (ref 3–12)
NEUTROS PCT: 50 % (ref 43–77)
Neutro Abs: 5.5 10*3/uL (ref 1.7–7.7)
Platelets: 213 10*3/uL (ref 150–400)
RBC: 4.77 MIL/uL (ref 3.87–5.11)
RDW: 18.2 % — AB (ref 11.5–15.5)
Smear Review: ADEQUATE
WBC: 10.8 10*3/uL — ABNORMAL HIGH (ref 4.0–10.5)

## 2014-09-02 MED ORDER — OXYCODONE-ACETAMINOPHEN 5-325 MG PO TABS: 1.0000 | ORAL_TABLET | ORAL | Status: DC | PRN

## 2014-09-02 MED ORDER — IOHEXOL 300 MG/ML  SOLN
50.0000 mL | Freq: Once | INTRAMUSCULAR | Status: AC | PRN
  Administered 2014-09-02: 50 mL via ORAL

## 2014-09-02 MED ORDER — METRONIDAZOLE 500 MG PO TABS
500.0000 mg | ORAL_TABLET | Freq: Once | ORAL | Status: AC
  Administered 2014-09-02: 500 mg via ORAL
  Filled 2014-09-02: qty 1

## 2014-09-02 MED ORDER — METRONIDAZOLE 500 MG PO TABS: ORAL_TABLET | ORAL | Status: DC

## 2014-09-02 MED ORDER — IOHEXOL 300 MG/ML  SOLN
100.0000 mL | Freq: Once | INTRAMUSCULAR | Status: AC | PRN
  Administered 2014-09-02: 100 mL via INTRAVENOUS

## 2014-09-02 MED ORDER — CIPROFLOXACIN IN D5W 400 MG/200ML IV SOLN
400.0000 mg | Freq: Once | INTRAVENOUS | Status: AC
  Administered 2014-09-02: 400 mg via INTRAVENOUS
  Filled 2014-09-02: qty 200

## 2014-09-02 MED ORDER — CIPROFLOXACIN HCL 500 MG PO TABS: ORAL_TABLET | ORAL | Status: DC

## 2014-09-02 MED ORDER — HYDROMORPHONE HCL 1 MG/ML IJ SOLN
1.0000 mg | Freq: Once | INTRAMUSCULAR | Status: AC
  Administered 2014-09-02: 1 mg via INTRAVENOUS
  Filled 2014-09-02: qty 1

## 2014-09-02 NOTE — Discharge Instructions (Signed)
Diverticulitis °Diverticulitis is when small pockets that have formed in your colon (large intestine) become infected or swollen. °HOME CARE °· Follow your doctor's instructions. °· Follow a special diet if told by your doctor. °· When you feel better, your doctor may tell you to change your diet. You may be told to eat a lot of fiber. Fruits and vegetables are good sources of fiber. Fiber makes it easier to poop (have bowel movements). °· Take supplements or probiotics as told by your doctor. °· Only take medicines as told by your doctor. °· Keep all follow-up visits with your doctor. °GET HELP IF: °· Your pain does not get better. °· You have a hard time eating food. °· You are not pooping like normal. °GET HELP RIGHT AWAY IF: °· Your pain gets worse. °· Your problems do not get better. °· Your problems suddenly get worse. °· You have a fever. °· You keep throwing up (vomiting). °· You have bloody or black, tarry poop (stool). °MAKE SURE YOU:  °· Understand these instructions. °· Will watch your condition. °· Will get help right away if you are not doing well or get worse. °Document Released: 07/31/2007 Document Revised: 02/16/2013 Document Reviewed: 01/06/2013 °[employer]® Patient Information ©2015 [employer]. This information is not intended to replace advice given to you by your health care provider. Make sure you discuss any questions you have with your health care provider. ° °

## 2014-09-05 ENCOUNTER — Encounter: Payer: Self-pay | Admitting: Gastroenterology

## 2014-09-05 ENCOUNTER — Ambulatory Visit (INDEPENDENT_AMBULATORY_CARE_PROVIDER_SITE_OTHER): Payer: Self-pay | Admitting: Gastroenterology

## 2014-09-05 ENCOUNTER — Other Ambulatory Visit: Payer: Self-pay

## 2014-09-05 VITALS — BP 148/84 | HR 73 | Temp 97.8°F | Ht 69.0 in | Wt 390.0 lb

## 2014-09-05 DIAGNOSIS — K5732 Diverticulitis of large intestine without perforation or abscess without bleeding: Secondary | ICD-10-CM

## 2014-09-05 DIAGNOSIS — B9681 Helicobacter pylori [H. pylori] as the cause of diseases classified elsewhere: Secondary | ICD-10-CM

## 2014-09-05 DIAGNOSIS — K2970 Gastritis, unspecified, without bleeding: Secondary | ICD-10-CM

## 2014-09-05 MED ORDER — OXYCODONE-ACETAMINOPHEN 5-325 MG PO TABS: 1.0000 | ORAL_TABLET | ORAL | Status: DC | PRN

## 2014-09-05 NOTE — Patient Instructions (Addendum)
I have given you another course of pain medication. Follow a low-fiber diet for now. We are referring you to a surgeon to discuss removing the part of your colon that is diseased. We will complete the breath test in 2 months to document the H.pylori is gone. You will need to be off of your reflux medicine for 10-14 days prior to this.     Diverticulitis Diverticulitis is inflammation or infection of small pouches in your colon that form when you have a condition called diverticulosis. The pouches in your colon are called diverticula. Your colon, or large intestine, is where water is absorbed and stool is formed. Complications of diverticulitis can include:  Bleeding.  Severe infection.  Severe pain.  Perforation of your colon.  Obstruction of your colon. CAUSES  Diverticulitis is caused by bacteria. Diverticulitis happens when stool becomes trapped in diverticula. This allows bacteria to grow in the diverticula, which can lead to inflammation and infection. RISK FACTORS People with diverticulosis are at risk for diverticulitis. Eating a diet that does not include enough fiber from fruits and vegetables may make diverticulitis more likely to develop. SYMPTOMS  Symptoms of diverticulitis may include:  Abdominal pain and tenderness. The pain is normally located on the left side of the abdomen, but may occur in other areas.  Fever and chills.  Bloating.  Cramping.  Nausea.  Vomiting.  Constipation.  Diarrhea.  Blood in your stool. DIAGNOSIS  Your health care provider will ask you about your medical history and do a physical exam. You may need to have tests done because many medical conditions can cause the same symptoms as diverticulitis. Tests may include:  Blood tests.  Urine tests.  Imaging tests of the abdomen, including X-rays and CT scans. When your condition is under control, your health care provider may recommend that you have a colonoscopy. A colonoscopy can  show how severe your diverticula are and whether something else is causing your symptoms. TREATMENT  Most cases of diverticulitis are mild and can be treated at home. Treatment may include:  Taking over-the-counter pain medicines.  Following a clear liquid diet.  Taking antibiotic medicines by mouth for 7-10 days. More severe cases may be treated at a hospital. Treatment may include:  Not eating or drinking.  Taking prescription pain medicine.  Receiving antibiotic medicines through an IV tube.  Receiving fluids and nutrition through an IV tube.  Surgery. HOME CARE INSTRUCTIONS   Follow your health care provider's instructions carefully.  Follow a full liquid diet or other diet as directed by your health care provider. After your symptoms improve, your health care provider may tell you to change your diet. He or she may recommend you eat a high-fiber diet. Fruits and vegetables are good sources of fiber. Fiber makes it easier to pass stool.  Take fiber supplements or probiotics as directed by your health care provider.  Only take medicines as directed by your health care provider.  Keep all your follow-up appointments. SEEK MEDICAL CARE IF:   Your pain does not improve.  You have a hard time eating food.  Your bowel movements do not return to normal. SEEK IMMEDIATE MEDICAL CARE IF:   Your pain becomes worse.  Your symptoms do not get better.  Your symptoms suddenly get worse.  You have a fever.  You have repeated vomiting.  You have bloody or black, tarry stools. MAKE SURE YOU:   Understand these instructions.  Will watch your condition.  Will get help right  away if you are not doing well or get worse. Document Released: 11/21/2004 Document Revised: 02/16/2013 Document Reviewed: 01/06/2013 [facility] [place] Patient Information 2015 [place], [place]. This information is not intended to replace advice given to you by your health care provider. Make sure you discuss any  questions you have with your health care provider.   Low-Fiber Diet Fiber is found in fruits, vegetables, and whole grains. A low-fiber diet restricts fibrous foods that are not digested in the small intestine. A diet containing about 10-15 grams of fiber per day is considered low fiber. Low-fiber diets may be used to:  Promote healing and rest the bowel during intestinal flare-ups.  Prevent blockage of a partially obstructed or narrowed gastrointestinal tract.  Reduce fecal weight and volume.  Slow the movement of feces. You may be on a low-fiber diet as a transitional diet following surgery, after an injury (trauma), or because of a short (acute) or lifelong (chronic) illness. Your health care provider will determine the length of time you need to stay on this diet.  WHAT DO I NEED TO KNOW ABOUT A LOW-FIBER DIET? Always check the fiber content on the packaging's Nutrition Facts label, especially on foods from the grains list. Ask your dietitian if you have questions about specific foods that are related to your condition, especially if the food is not listed below. In general, a low-fiber food will have less than 2 g of fiber. WHAT FOODS CAN I EAT? Grains All breads and crackers made with white flour. Sweet rolls, doughnuts, waffles, pancakes, [place] toast, bagels. Pretzels, Melba toast, zwieback. Well-cooked cereals, such as cornmeal, farina, or cream cereals. Dry cereals that do not contain whole grains, fruit, or nuts, such as refined corn, wheat, rice, and oat cereals. Potatoes prepared any way without skins, plain pastas and noodles, refined white rice. Use white flour for baking and making sauces. Use allowed list of grains for casseroles, dumplings, and puddings.  Vegetables Strained tomato and vegetable juices. Fresh lettuce, cucumber, spinach. Well-cooked (no skin or pulp) or canned vegetables, such as asparagus, bean sprouts, beets, carrots, green beans, mushrooms, potatoes, pumpkin,  spinach, yellow squash, tomato sauce/puree, turnips, yams, and zucchini. Keep servings limited to  cup.  Fruits All fruit juices except prune juice. Cooked or canned fruits without skin and seeds, such as applesauce, apricots, cherries, fruit cocktail, grapefruit, grapes, mandarin oranges, melons, peaches, pears, pineapple, and plums. Fresh fruits without skin, such as apricots, avocados, bananas, melons, pineapple, nectarines, and peaches. Keep servings limited to  cup or 1 piece.  Meat and Other Protein Sources Ground or well-cooked tender beef, ham, veal, lamb, pork, or poultry. Eggs, plain cheese. Fish, oysters, shrimp, lobster, and other seafood. Liver, organ meats. Smooth nut butters. Dairy All milk products and alternative dairy substitutes, such as soy, rice, almond, and coconut, not containing added whole nuts, seeds, or added fruit. Beverages Decaf coffee, fruit, and vegetable juices or smoothies (small amounts, with no pulp or skins, and with fruits from allowed list), sports drinks, herbal tea. Condiments Ketchup, mustard, vinegar, cream sauce, cheese sauce, cocoa powder. Spices in moderation, such as allspice, basil, bay leaves, celery powder or leaves, cinnamon, cumin powder, curry powder, ginger, mace, marjoram, onion or garlic powder, oregano, paprika, parsley flakes, ground pepper, rosemary, sage, savory, tarragon, thyme, and turmeric. Sweets and Desserts Plain cakes and cookies, pie made with allowed fruit, pudding, custard, cream pie. Gelatin, fruit, ice, sherbet, frozen ice pops. Ice cream, ice milk without nuts. Plain hard candy, honey, jelly, molasses,  syrup, sugar, chocolate syrup, gumdrops, marshmallows. Limit overall sugar intake.  Fats and Oil Margarine, butter, cream, mayonnaise, salad oils, plain salad dressings made from allowed foods. Choose healthy fats such as olive oil, canola oil, and omega-3 fatty acids (such as found in salmon or tuna) when possible.   Other Bouillon, broth, or cream soups made from allowed foods. Any strained soup. Casseroles or mixed dishes made with allowed foods. The items listed above may not be a complete list of recommended foods or beverages. Contact your dietitian for more options.  WHAT FOODS ARE NOT RECOMMENDED? Grains All whole wheat and whole grain breads and crackers. Multigrains, rye, bran seeds, nuts, or coconut. Cereals containing whole grains, multigrains, bran, coconut, nuts, raisins. Cooked or dry oatmeal, steel-cut oats. Coarse wheat cereals, granola. Cereals advertised as high fiber. Potato skins. Whole grain pasta, wild or brown rice. Popcorn. Coconut flour. Bran, buckwheat, corn bread, multigrains, rye, wheat germ.  Vegetables Fresh, cooked or canned vegetables, such as artichokes, asparagus, beet greens, broccoli, Brussels sprouts, cabbage, celery, cauliflower, corn, eggplant, kale, legumes or beans, okra, peas, and tomatoes. Avoid large servings of any vegetables, especially raw vegetables.  Fruits Fresh fruits, such as apples with or without skin, berries, cherries, figs, grapes, grapefruit, guavas, kiwis, mangoes, oranges, papayas, pears, persimmons, pineapple, and pomegranate. Prune juice and juices with pulp, stewed or dried prunes. Dried fruits, dates, raisins. Fruit seeds or skins. Avoid large servings of all fresh fruits. Meats and Other Protein Sources Tough, fibrous meats with gristle. Chunky nut butter. Cheese made with seeds, nuts, or other foods not recommended. Nuts, seeds, legumes (beans, including baked beans), dried peas, beans, lentils.  Dairy Yogurt or cheese that contains nuts, seeds, or added fruit.  Beverages Fruit juices with high pulp, prune juice. Caffeinated coffee and teas.  Condiments Coconut, maple syrup, pickles, olives. Sweets and Desserts Desserts, cookies, or candies that contain nuts or coconut, chunky peanut butter, dried fruits. Jams, preserves with seeds, marmalade.  Large amounts of sugar and sweets. Any other dessert made with fruits from the not recommended list.  Other Soups made from vegetables that are not recommended or that contain other foods not recommended.  The items listed above may not be a complete list of foods and beverages to avoid. Contact your dietitian for more information. Document Released: 08/03/2001 Document Revised: 02/16/2013 Document Reviewed: 01/04/2013 [facility] Patient Information 2015 [place], [place]. This information is not intended to replace advice given to you by your health care provider. Make sure you discuss any questions you have with your health care provider.

## 2014-09-05 NOTE — Progress Notes (Signed)
Referring Provider: No ref. provider found Primary Care Physician:  No PCP Per Patient  Primary GI: Dr. Oneida Alar   Chief Complaint  Patient presents with  . Follow-up    HPI:   Marilyn Rivera is a 34 y.o. female presenting today with a history of H.pylori gastritis s/p treatment recently, sigmoid diverticulitis in March 2016 and most recently July 2016. Colonoscopy on file. Recently seen in ED with diverticulitis.    Didn't sleep well last night. Pain slowly improving. Cipro and Flagyl. Has some nausea medication. Vomiting Friday morning. Completed Pylera. Afebrile. Has headache.   Past Medical History  Diagnosis Date  . Kidney stones   . GERD (gastroesophageal reflux disease)   . Diverticulosis     Past Surgical History  Procedure Laterality Date  . Knee surgery Right   . Renal calculi removal Left 2010  . Skin lesion excision      over right eyebrow due to wax being left above eye and it seeped down into pore  . Colonoscopy N/A 06/10/2014    Dr. Oneida Alar; redundant sigmoid colon, moderate diverticulosis in the sigmoid and descending colon. small internal hemorrhoids. Next screening at age 56.   Marland Kitchen Esophagogastroduodenoscopy N/A 06/10/2014    Dr. Oneida Alar: H.pylori gastritis s/p treatment with Amoxicillin and Biaxin    Current Outpatient Prescriptions  Medication Sig Dispense Refill  . benzonatate (TESSALON) 100 MG capsule Take 100 mg by mouth 3 (three) times daily as needed for cough.    . bisacodyl (DULCOLAX) 5 MG EC tablet Take 5 mg by mouth daily as needed for moderate constipation.    . ciprofloxacin (CIPRO) 500 MG tablet One po bid x 10 days 20 tablet 0  . flunisolide (NASAREL) 29 MCG/ACT (0.025%) nasal spray Place 2 sprays into the nose 2 (two) times daily. Dose is for each nostril.    Marland Kitchen ibuprofen (ADVIL,MOTRIN) 200 MG tablet Take 400 mg by mouth every 6 (six) hours as needed for moderate pain or cramping.     . loratadine (CLARITIN) 10 MG tablet Take 10 mg by mouth  daily.    . metroNIDAZOLE (FLAGYL) 500 MG tablet One po bid x 10 days 20 tablet 0  . ondansetron (ZOFRAN) 4 MG tablet Take 1 tablet (4 mg total) by mouth 4 (four) times daily -  with meals and at bedtime. 120 tablet 1  . oxyCODONE-acetaminophen (PERCOCET/ROXICET) 5-325 MG per tablet Take 1 tablet by mouth every 4 (four) hours as needed for severe pain. 20 tablet 0  . pantoprazole (PROTONIX) 40 MG tablet Take 1 tablet (40 mg total) by mouth daily. 30 minutes before breakfast 30 tablet 11  . [DISCONTINUED] Linaclotide (LINZESS) 145 MCG CAPS capsule 1 PO 30 mins prior to your first meal (Patient not taking: Reported on 09/01/2014) 30 capsule 11   No current facility-administered medications for this visit.    Allergies as of 09/05/2014  . (No Known Allergies)    Family History  Problem Relation Age of Onset  . Heart failure Mother   . Cancer Other     mother's side of family, breast cancer  . Cancer Other     father's side of family, leukemia  . Hypertension Father   . Diabetes Other   . Hypertension Other   . Colon cancer Neg Hx     History   Social History  . Marital Status: Married    Spouse Name: N/A  . Number of Children: 0  . Years of Education:  N/A   Occupational History  . [employer], coordinator for resource side    Social History Main Topics  . Smoking status: Never Smoker   . Smokeless tobacco: Never Used  . Alcohol Use: No  . Drug Use: No  . Sexual Activity: Yes    Birth Control/ Protection: None   Other Topics Concern  . None   Social History Narrative    Review of Systems: As mentioned in HPI  Physical Exam: BP 148/84 mmHg  Pulse 73  Temp(Src) 97.8 F (36.6 C) (Oral)  Ht 5\' 9"  (1.753 m)  Wt 390 lb (176.903 kg)  BMI 57.57 kg/m2  LMP 08/20/2014 General:   Alert and oriented. No distress noted. Pleasant and cooperative.  Head:  Normocephalic and atraumatic. Eyes:  Conjuctiva clear without scleral icterus. Mouth:  Oral mucosa pink and moist. Good  dentition. No lesions. Abdomen:  +BS, soft, mild discomfort LLQ with palpation and non-distended. No rebound or guarding. No HSM or masses noted. Obese.  Msk:  Symmetrical without gross deformities. Normal posture. Extremities:  Without edema. Neurologic:  Alert and  oriented x4;  grossly normal neurologically. Skin:  Intact without significant lesions or rashes. Psych:  Alert and cooperative. Normal mood and affect.

## 2014-09-06 DIAGNOSIS — B9681 Helicobacter pylori [H. pylori] as the cause of diseases classified elsewhere: Secondary | ICD-10-CM

## 2014-09-06 DIAGNOSIS — K2970 Gastritis, unspecified, without bleeding: Secondary | ICD-10-CM

## 2014-09-06 HISTORY — DX: Helicobacter pylori (H. pylori) as the cause of diseases classified elsewhere: B96.81

## 2014-09-06 NOTE — Assessment & Plan Note (Signed)
Needs breath test in 2 months to document eradication. Will need to be off PPI at least 10-14 days to avoid false negative results.

## 2014-09-06 NOTE — Assessment & Plan Note (Signed)
34 year old female with recurrent sigmoid diverticulitis, second documented episode, improving with Cipro/Flagyl and supportive measures. Will provide short course of narcotics. Continue anti-emetics prn. Low-residue diet. Colonoscopy on file. Will refer to general surgeon for consideration of elective colectomy due to recurrent diverticulitis.

## 2014-09-06 NOTE — Progress Notes (Signed)
REVIEWED-NO ADDITIONAL RECOMMENDATIONS. 

## 2014-09-08 ENCOUNTER — Other Ambulatory Visit: Payer: Self-pay | Admitting: Gastroenterology

## 2014-09-08 DIAGNOSIS — K2970 Gastritis, unspecified, without bleeding: Principal | ICD-10-CM

## 2014-09-08 DIAGNOSIS — B9681 Helicobacter pylori [H. pylori] as the cause of diseases classified elsewhere: Secondary | ICD-10-CM

## 2014-09-09 NOTE — Progress Notes (Signed)
NO PCP PER PATIENT °

## 2014-09-21 ENCOUNTER — Ambulatory Visit: Payer: Self-pay | Admitting: Surgery

## 2014-09-27 ENCOUNTER — Other Ambulatory Visit: Payer: Self-pay

## 2014-09-27 DIAGNOSIS — B9681 Helicobacter pylori [H. pylori] as the cause of diseases classified elsewhere: Secondary | ICD-10-CM

## 2014-09-27 DIAGNOSIS — K2970 Gastritis, unspecified, without bleeding: Principal | ICD-10-CM

## 2014-09-28 ENCOUNTER — Encounter: Payer: Self-pay | Admitting: Surgery

## 2014-09-28 ENCOUNTER — Ambulatory Visit (INDEPENDENT_AMBULATORY_CARE_PROVIDER_SITE_OTHER): Payer: Self-pay | Admitting: Surgery

## 2014-09-28 VITALS — BP 155/102 | HR 71 | Temp 98.1°F | Ht 69.5 in | Wt 391.5 lb

## 2014-09-28 DIAGNOSIS — K5732 Diverticulitis of large intestine without perforation or abscess without bleeding: Secondary | ICD-10-CM

## 2014-09-28 MED ORDER — OXYCODONE-ACETAMINOPHEN 5-325 MG PO TABS: 1.0000 | ORAL_TABLET | ORAL | Status: DC | PRN

## 2014-09-28 NOTE — Progress Notes (Signed)
Surgery History and Physical  CC:  Recurrent LLQ pain, recurrent diverticulitis  HPI:  34 yo F who presents with recurrent LLQ pain, diverticulitis.  2 prior episodes this year.  Has had colonoscopy which shows diverticuli.  Now with constant aching pain and nausea with PO.  No fevers/chills, night sweats, shortness of breath, cough, chest pain, emesis, diarrhea/constipation, dysuria/hematuria.  Active Ambulatory Problems    Diagnosis Date Noted  . Diverticulitis 04/27/2014  . Morbid obesity 04/27/2014  . UTI (lower urinary tract infection) 04/27/2014  . Intractable abdominal pain 04/27/2014  . Leukocytosis 04/27/2014  . Diverticulitis of large intestine without perforation or abscess without bleeding   . Normocytic anemia   . Nausea without vomiting 05/26/2014  . Diverticulitis of colon   . Helicobacter pylori gastritis 09/06/2014   Resolved Ambulatory Problems    Diagnosis Date Noted  . No Resolved Ambulatory Problems   Past Medical History  Diagnosis Date  . Kidney stones   . GERD (gastroesophageal reflux disease)   . Diverticulosis    Past Surgical History  Procedure Laterality Date  . Knee surgery Right   . Renal calculi removal Left 2010  . Skin lesion excision      over right eyebrow due to wax being left above eye and it seeped down into pore  . Colonoscopy N/A 06/10/2014    Dr. Oneida Alar; redundant sigmoid colon, moderate diverticulosis in the sigmoid and descending colon. small internal hemorrhoids. Next screening at age 50.   Marland Kitchen Esophagogastroduodenoscopy N/A 06/10/2014    Dr. Oneida Alar: H.pylori gastritis s/p treatment with Amoxicillin and Biaxin     Medication List       This list is accurate as of: 09/28/14 11:28 AM.  Always use your most recent med list.               benzonatate 100 MG capsule  Commonly known as:  TESSALON  Take 100 mg by mouth 3 (three) times daily as needed for cough.     bisacodyl 5 MG EC tablet  Commonly known as:  DULCOLAX  Take 5 mg  by mouth daily as needed for moderate constipation.     ciprofloxacin 500 MG tablet  Commonly known as:  CIPRO  One po bid x 10 days     flunisolide 29 MCG/ACT (0.025%) nasal spray  Commonly known as:  NASAREL  Place 2 sprays into the nose 2 (two) times daily. Dose is for each nostril.     ibuprofen 200 MG tablet  Commonly known as:  ADVIL,MOTRIN  Take 400 mg by mouth every 6 (six) hours as needed for moderate pain or cramping.     loratadine 10 MG tablet  Commonly known as:  CLARITIN  Take 10 mg by mouth daily.     metroNIDAZOLE 500 MG tablet  Commonly known as:  FLAGYL  One po bid x 10 days     naproxen sodium 220 MG tablet  Commonly known as:  ANAPROX  Take 220 mg by mouth 2 (two) times daily with a meal.     ondansetron 4 MG tablet  Commonly known as:  ZOFRAN  Take 1 tablet (4 mg total) by mouth 4 (four) times daily -  with meals and at bedtime.     oxyCODONE-acetaminophen 5-325 MG per tablet  Commonly known as:  PERCOCET/ROXICET  Take 1 tablet by mouth every 4 (four) hours as needed for severe pain.     pantoprazole 40 MG tablet  Commonly known as:  PROTONIX  Take 1 tablet (40 mg total) by mouth daily. 30 minutes before breakfast     STOOL SOFTENER 100 MG capsule  Generic drug:  docusate sodium  Take 100 mg by mouth 2 (two) times daily.       No Known Allergies   History   Social History  . Marital Status: Married    Spouse Name: N/A  . Number of Children: 0  . Years of Education: N/A   Occupational History  . [employer], coordinator for resource side    Social History Main Topics  . Smoking status: Never Smoker   . Smokeless tobacco: Never Used  . Alcohol Use: No  . Drug Use: No  . Sexual Activity: Yes    Birth Control/ Protection: None   Other Topics Concern  . Not on file   Social History Narrative   Family History  Problem Relation Age of Onset  . Heart failure Mother   . Cancer Other     mother's side of family, breast cancer  .  Cancer Other     father's side of family, leukemia  . Hypertension Father   . Diabetes Other   . Hypertension Other   . Colon cancer Neg Hx    Blood pressure 155/102, pulse 71, temperature 98.1 F (36.7 C), temperature source Oral, height 5' 9.5" (1.765 m), weight 177.583 kg (391 lb 8 oz), last menstrual period 08/20/2014. GEN: NAD/A&Ox3 FACE: no obvious facial trauma, normal external nose, normal external ears EYES: no scleral icterus, no conjunctivitis HEAD: normocephalic atraumatic CV: RRR, no MRG RESP: moving air well, lungs clear ABD: soft, nontender, nondistended EXT: moving all ext well, strength 5/5 NEURO: cnII-XII grossly intact, sensation intact all 4 ext  Labs: 7/7: Personally reviewed, WBC 10.8, 50% neutrophils CT: 7/8: Personally reviewed, mild sigmoid diverticulitis  A/P 34 yo F with recurrent diverticulitis.  Currently with chronic pain, nausea.  Would likely benefit from laparoscopic sigmoid colectomy.  However with patient's body habitus and risks associated with this as well as possible recurrence in future due to patients young age, I feel that this would be better treated by a specific colorectal surgeon.  Have referred to [facility] colorectal surgery. I have told her that if she does not want to be operated on by them then I would see her back in the future for rediscussion for possible colectomy.

## 2014-09-28 NOTE — Patient Instructions (Signed)
We will send your referral to see a colorectal surgeon from [facility]. If you have any further questions, please give [place] a call.

## 2014-10-03 ENCOUNTER — Telehealth: Payer: Self-pay

## 2014-10-03 NOTE — Telephone Encounter (Signed)
[facility] [phone] to make sure that they had received patient's referral through EP[employer]. Janett Billow (referral coordinator) stated that they do not use EP[employer] to work on, just to view. She asked if I could fax the referral, office visit notes, labs and CT. I told her that I would. Janett Billow gave me their fax number [phone]. Once she receives the referral, she will contact the patient to schedule her an appointment for a consult. This has been faxed attention to [place].  Called patient to reaffirm her that I had faxed her referral and that they would be contacting her within this week. Patient understood and had no further questions.

## 2014-10-03 NOTE — Telephone Encounter (Signed)
Patient called me back stating that her appointment to [facility] was cancelled because they do not accept [facility]. I told patient that I would call her back until I was able to figure things up. To help patient, I then called [facility] and asked if they were part of [facility]. Jessica-referral coordinator told me that they were not affiliated with [facility]. They only use [facility] hospital.   Called patient back and told her that [facility] will only cover for physicians bills but if she decided on doing surgery, she will have to pay. I asked patient if she wanted to continue with her referral or I recommended for her to go to [facility] and apply for their Financial Assistance program and send her referral there. Patient has gotten frustrated with this problem and stated that she would talk to her husband and as soon as they agreed on something, that she would call me back.   [facility] Financial Assistance: [phone] in case patient calls back.

## 2014-10-04 ENCOUNTER — Emergency Department (HOSPITAL_COMMUNITY): Payer: Self-pay

## 2014-10-04 ENCOUNTER — Inpatient Hospital Stay (HOSPITAL_COMMUNITY)
Admission: EM | Admit: 2014-10-04 | Discharge: 2014-10-07 | DRG: 392 | Disposition: A | Discharge status: Home / Self Care | Payer: Self-pay | Attending: Internal Medicine | Admitting: Internal Medicine

## 2014-10-04 ENCOUNTER — Encounter (HOSPITAL_COMMUNITY): Payer: Self-pay | Admitting: *Deleted

## 2014-10-04 DIAGNOSIS — Z8249 Family history of ischemic heart disease and other diseases of the circulatory system: Secondary | ICD-10-CM

## 2014-10-04 DIAGNOSIS — Z833 Family history of diabetes mellitus: Secondary | ICD-10-CM

## 2014-10-04 DIAGNOSIS — K5732 Diverticulitis of large intestine without perforation or abscess without bleeding: Principal | ICD-10-CM | POA: Diagnosis present

## 2014-10-04 DIAGNOSIS — D649 Anemia, unspecified: Secondary | ICD-10-CM | POA: Diagnosis present

## 2014-10-04 DIAGNOSIS — R110 Nausea: Secondary | ICD-10-CM | POA: Diagnosis present

## 2014-10-04 DIAGNOSIS — R1032 Left lower quadrant pain: Secondary | ICD-10-CM

## 2014-10-04 DIAGNOSIS — Z6841 Body Mass Index (BMI) 40.0 and over, adult: Secondary | ICD-10-CM

## 2014-10-04 DIAGNOSIS — Z806 Family history of leukemia: Secondary | ICD-10-CM

## 2014-10-04 DIAGNOSIS — K219 Gastro-esophageal reflux disease without esophagitis: Secondary | ICD-10-CM | POA: Diagnosis present

## 2014-10-04 DIAGNOSIS — Z803 Family history of malignant neoplasm of breast: Secondary | ICD-10-CM

## 2014-10-04 LAB — CBC WITH DIFFERENTIAL/PLATELET
BASOS ABS: 0 10*3/uL (ref 0.0–0.1)
BASOS PCT: 0 % (ref 0–1)
Eosinophils Absolute: 0 10*3/uL (ref 0.0–0.7)
Eosinophils Relative: 0 % (ref 0–5)
HEMATOCRIT: 38.4 % (ref 36.0–46.0)
Hemoglobin: 12.3 g/dL (ref 12.0–15.0)
LYMPHS PCT: 17 % (ref 12–46)
Lymphs Abs: 1.9 10*3/uL (ref 0.7–4.0)
MCH: 25.6 pg — ABNORMAL LOW (ref 26.0–34.0)
MCHC: 32 g/dL (ref 30.0–36.0)
MCV: 80 fL (ref 78.0–100.0)
MONO ABS: 0.6 10*3/uL (ref 0.1–1.0)
MONOS PCT: 6 % (ref 3–12)
Neutro Abs: 8.5 10*3/uL — ABNORMAL HIGH (ref 1.7–7.7)
Neutrophils Relative %: 77 % (ref 43–77)
PLATELETS: 195 10*3/uL (ref 150–400)
RBC: 4.8 MIL/uL (ref 3.87–5.11)
RDW: 17.9 % — ABNORMAL HIGH (ref 11.5–15.5)
WBC: 11.1 10*3/uL — AB (ref 4.0–10.5)

## 2014-10-04 LAB — COMPREHENSIVE METABOLIC PANEL
ALBUMIN: 3.5 g/dL (ref 3.5–5.0)
ALK PHOS: 69 U/L (ref 38–126)
ALT: 17 U/L (ref 14–54)
AST: 18 U/L (ref 15–41)
Anion gap: 6 (ref 5–15)
BUN: 10 mg/dL (ref 6–20)
CALCIUM: 8.6 mg/dL — AB (ref 8.9–10.3)
CHLORIDE: 105 mmol/L (ref 101–111)
CO2: 24 mmol/L (ref 22–32)
CREATININE: 0.92 mg/dL (ref 0.44–1.00)
GFR calc Af Amer: >60 mL/min (ref >60)
GFR calc non Af Amer: >60 mL/min (ref >60)
Glucose, Bld: 146 mg/dL — ABNORMAL HIGH (ref 65–99)
POTASSIUM: 3.5 mmol/L (ref 3.5–5.1)
Sodium: 135 mmol/L (ref 135–145)
TOTAL PROTEIN: 7.5 g/dL (ref 6.5–8.1)
Total Bilirubin: 0.2 mg/dL — ABNORMAL LOW (ref 0.3–1.2)

## 2014-10-04 LAB — LIPASE, BLOOD: Lipase: 22 U/L (ref 22–51)

## 2014-10-04 MED ORDER — HYDROMORPHONE HCL 1 MG/ML IJ SOLN
1.0000 mg | INTRAMUSCULAR | Status: AC | PRN
  Administered 2014-10-04 – 2014-10-05 (×3): 1 mg via INTRAVENOUS
  Filled 2014-10-04 (×4): qty 1

## 2014-10-04 MED ORDER — SODIUM CHLORIDE 0.9 % IV SOLN
1000.0000 mL | INTRAVENOUS | Status: DC
  Administered 2014-10-04: 1000 mL via INTRAVENOUS

## 2014-10-04 MED ORDER — SODIUM CHLORIDE 0.9 % IV SOLN
1000.0000 mL | Freq: Once | INTRAVENOUS | Status: AC
  Administered 2014-10-04: 1000 mL via INTRAVENOUS

## 2014-10-04 MED ORDER — IOHEXOL 300 MG/ML  SOLN
50.0000 mL | Freq: Once | INTRAMUSCULAR | Status: AC | PRN
  Administered 2014-10-04: 50 mL via ORAL

## 2014-10-04 MED ORDER — ONDANSETRON HCL 4 MG/2ML IJ SOLN
4.0000 mg | Freq: Once | INTRAMUSCULAR | Status: AC
  Administered 2014-10-04: 4 mg via INTRAVENOUS
  Filled 2014-10-04: qty 2

## 2014-10-04 NOTE — ED Notes (Signed)
Charge nurse made aware 
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
## 2014-10-04 NOTE — ED Provider Notes (Signed)
CSN: [id number]     Arrival date & time 10/04/14  2054 History  This chart was scribed for Dorie Rank, MD by Helane Gunther, ED Scribe. This patient was seen in room APA04/APA04 and the patient's care was started at 10:30 PM.     Chief Complaint  Patient presents with  . Abdominal Pain   Patient is a 34 y.o. female presenting with abdominal pain. The history is provided by the patient. No language interpreter was used.  Abdominal Pain Pain location:  LLQ Pain quality: sharp   Pain radiates to:  RLQ Pain severity:  Severe Onset quality:  Sudden Timing:  Constant Chronicity:  New Relieved by:  None tried Worsened by:  Nothing tried Ineffective treatments:  None tried Associated symptoms: nausea   Risk factors: obesity    HPI Comments: Marilyn Rivera is a 34 y.o. female who presents to the Emergency Department complaining of constant, sharp, LLQ abdominal pain sudden onset this evening just PTA. She states the pain is radiating across her lower abdomen. She reports associated nausea this morning. She has a PMHx of diverticulitis. She is scheduled to see a specialist for this on 8/24.   Past Medical History  Diagnosis Date  . Kidney stones   . GERD (gastroesophageal reflux disease)   . Diverticulosis    Past Surgical History  Procedure Laterality Date  . Knee surgery Right   . Renal calculi removal Left 2010  . Skin lesion excision      over right eyebrow due to wax being left above eye and it seeped down into pore  . Colonoscopy N/A 06/10/2014    Dr. Oneida Alar; redundant sigmoid colon, moderate diverticulosis in the sigmoid and descending colon. small internal hemorrhoids. Next screening at age 6.   Marland Kitchen Esophagogastroduodenoscopy N/A 06/10/2014    Dr. Oneida Alar: H.pylori gastritis s/p treatment with Amoxicillin and Biaxin   Family History  Problem Relation Age of Onset  . Heart failure Mother   . Cancer Other     mother's side of family, breast cancer  . Cancer Other     father's  side of family, leukemia  . Hypertension Father   . Diabetes Other   . Hypertension Other   . Colon cancer Neg Hx    History  Substance Use Topics  . Smoking status: Never Smoker   . Smokeless tobacco: Never Used  . Alcohol Use: No   OB History    No data available     Review of Systems  Gastrointestinal: Positive for nausea and abdominal pain.  All other systems reviewed and are negative.   Allergies  Review of patient's allergies indicates no known allergies.  Home Medications   Prior to Admission medications   Medication Sig Start Date End Date Taking? Authorizing Provider  benzonatate (TESSALON) 100 MG capsule Take 100 mg by mouth 3 (three) times daily as needed for cough.    Historical Provider, MD  bisacodyl (DULCOLAX) 5 MG EC tablet Take 5 mg by mouth daily as needed for moderate constipation.    Historical Provider, MD  ciprofloxacin (CIPRO) 500 MG tablet One po bid x 10 days Patient not taking: Reported on 09/28/2014 09/02/14   Ripley Fraise, MD  docusate sodium (STOOL SOFTENER) 100 MG capsule Take 100 mg by mouth 2 (two) times daily.    Historical Provider, MD  flunisolide (NASAREL) 29 MCG/ACT (0.025%) nasal spray Place 2 sprays into the nose 2 (two) times daily. Dose is for each nostril.    Historical  Provider, MD  ibuprofen (ADVIL,MOTRIN) 200 MG tablet Take 400 mg by mouth every 6 (six) hours as needed for moderate pain or cramping.     Historical Provider, MD  loratadine (CLARITIN) 10 MG tablet Take 10 mg by mouth daily.    Historical Provider, MD  metroNIDAZOLE (FLAGYL) 500 MG tablet One po bid x 10 days 09/02/14   Ripley Fraise, MD  naproxen sodium (ANAPROX) 220 MG tablet Take 220 mg by mouth 2 (two) times daily with a meal.    Historical Provider, MD  ondansetron (ZOFRAN) 4 MG tablet Take 1 tablet (4 mg total) by mouth 4 (four) times daily -  with meals and at bedtime. 05/26/14   Orvil Feil, NP  oxyCODONE-acetaminophen (PERCOCET/ROXICET) 5-325 MG per tablet Take 1  tablet by mouth every 4 (four) hours as needed for severe pain. 09/28/14   Marlyce Huge, MD  pantoprazole (PROTONIX) 40 MG tablet Take 1 tablet (40 mg total) by mouth daily. 30 minutes before breakfast 08/04/14   Carlis Stable, NP   BP 158/104 mmHg  Pulse 90  Temp(Src) 98.2 F (36.8 C)  Resp 20  Ht 5\' 10"  (1.778 m)  Wt 391 lb (177.356 kg)  BMI 56.10 kg/m2  SpO2 100%  LMP 09/11/2014 Physical Exam  Constitutional: She appears well-developed and well-nourished. She appears distressed.  Morbidly obese  HENT:  Head: Normocephalic and atraumatic.  Right Ear: External ear normal.  Left Ear: External ear normal.  Eyes: Conjunctivae are normal. Right eye exhibits no discharge. Left eye exhibits no discharge. No scleral icterus.  Neck: Neck supple. No tracheal deviation present.  Cardiovascular: Normal rate, regular rhythm and intact distal pulses.   Pulmonary/Chest: Effort normal and breath sounds normal. No stridor. No respiratory distress. She has no wheezes. She has no rales.  Abdominal: Soft. Bowel sounds are normal. She exhibits no distension, no ascites and no mass. There is tenderness in the left lower quadrant. There is guarding. There is no rigidity and no rebound. No hernia.  Musculoskeletal: She exhibits no edema or tenderness.  Neurological: She is alert. She has normal strength. No cranial nerve deficit (no facial droop, extraocular movements intact, no slurred speech) or sensory deficit. She exhibits normal muscle tone. She displays no seizure activity. Coordination normal.  Skin: Skin is warm and dry. No rash noted.  Psychiatric: Her mood appears anxious.  Nursing note and vitals reviewed.   ED Course  Procedures  DIAGNOSTIC STUDIES: Oxygen Saturation is 100% on RA, normal by my interpretation.    COORDINATION OF CARE: 10:34 PM - Discussed plans to order Dilaudid and wait on diagnostic studies. Advised that a CAT scan might be necessary. Pt advised of plan for treatment  and pt agrees.  Labs Review Labs Reviewed  COMPREHENSIVE METABOLIC PANEL - Abnormal; Notable for the following:    Glucose, Bld 146 (*)    Calcium 8.6 (*)    Total Bilirubin 0.2 (*)    All other components within normal limits  LIPASE, BLOOD  CBC WITH DIFFERENTIAL/PLATELET  URINALYSIS, ROUTINE W REFLEX MICROSCOPIC (NOT AT [facility])  PREGNANCY, URINE    Medications  0.9 %  sodium chloride infusion (1,000 mLs Intravenous New Bag/Given 10/04/14 2303)    Followed by  0.9 %  sodium chloride infusion (1,000 mLs Intravenous New Bag/Given 10/04/14 2303)  HYDROmorphone (DILAUDID) injection 1 mg (1 mg Intravenous Given 10/04/14 2327)  ondansetron (ZOFRAN) injection 4 mg (4 mg Intravenous Given 10/04/14 2251)     MDM   Pt  has history of diverticulitis.  She is scheduled to consult with surgery because of her recurrent attacks.  Plan on labs, and CT abd pelvis.  Case will be turned over to oncoming MD.  I personally performed the services described in this documentation, which was scribed in my presence.  The recorded information has been reviewed and is accurate.   Dorie Rank, MD 10/04/14 [phone]

## 2014-10-04 NOTE — ED Notes (Signed)
Security made aware also.

## 2014-10-04 NOTE — ED Notes (Signed)
Pt c/o left lower abdominal pain. Denies n/v/d. Pt screaming and moaning in triage very hard to get any answers from pt.

## 2014-10-04 NOTE — ED Notes (Signed)
Pt's husband was just allowed back again and he walked into triage hovering over this nurses desk wanting to know when his wife will be getting a room.

## 2014-10-05 DIAGNOSIS — R1032 Left lower quadrant pain: Secondary | ICD-10-CM | POA: Insufficient documentation

## 2014-10-05 DIAGNOSIS — K5732 Diverticulitis of large intestine without perforation or abscess without bleeding: Secondary | ICD-10-CM

## 2014-10-05 DIAGNOSIS — D649 Anemia, unspecified: Secondary | ICD-10-CM

## 2014-10-05 HISTORY — DX: Diverticulitis of large intestine without perforation or abscess without bleeding: K57.32

## 2014-10-05 LAB — URINALYSIS, ROUTINE W REFLEX MICROSCOPIC
BILIRUBIN URINE: NEGATIVE
GLUCOSE, UA: NEGATIVE mg/dL
KETONES UR: NEGATIVE mg/dL
Leukocytes, UA: NEGATIVE
Nitrite: NEGATIVE
Protein, ur: NEGATIVE mg/dL
SPECIFIC GRAVITY, URINE: 1.025 (ref 1.005–1.030)
Urobilinogen, UA: 0.2 mg/dL (ref 0.0–1.0)
pH: 6 (ref 5.0–8.0)

## 2014-10-05 LAB — PREGNANCY, URINE: Preg Test, Ur: NEGATIVE

## 2014-10-05 LAB — URINE MICROSCOPIC-ADD ON

## 2014-10-05 MED ORDER — CIPROFLOXACIN IN D5W 400 MG/200ML IV SOLN
400.0000 mg | Freq: Once | INTRAVENOUS | Status: AC
  Administered 2014-10-05: 400 mg via INTRAVENOUS
  Filled 2014-10-05: qty 200

## 2014-10-05 MED ORDER — SODIUM CHLORIDE 0.9 % IV SOLN: INTRAVENOUS | Status: AC

## 2014-10-05 MED ORDER — HEPARIN SODIUM (PORCINE) 5000 UNIT/ML IJ SOLN
5000.0000 [IU] | Freq: Three times a day (TID) | INTRAMUSCULAR | Status: DC
  Administered 2014-10-05 – 2014-10-07 (×6): 5000 [IU] via SUBCUTANEOUS
  Filled 2014-10-05 (×6): qty 1

## 2014-10-05 MED ORDER — SODIUM CHLORIDE 0.9 % IV SOLN
INTRAVENOUS | Status: DC
  Administered 2014-10-06 – 2014-10-07 (×3): via INTRAVENOUS

## 2014-10-05 MED ORDER — HYDROMORPHONE HCL 1 MG/ML IJ SOLN
0.5000 mg | Freq: Once | INTRAMUSCULAR | Status: AC
  Administered 2014-10-05: 0.5 mg via INTRAVENOUS

## 2014-10-05 MED ORDER — PANTOPRAZOLE SODIUM 40 MG IV SOLR
40.0000 mg | INTRAVENOUS | Status: DC
  Administered 2014-10-05 – 2014-10-06 (×2): 40 mg via INTRAVENOUS
  Filled 2014-10-05 (×2): qty 40

## 2014-10-05 MED ORDER — ALUM & MAG HYDROXIDE-SIMETH 200-200-20 MG/5ML PO SUSP
30.0000 mL | Freq: Four times a day (QID) | ORAL | Status: DC | PRN
  Administered 2014-10-05 – 2014-10-06 (×2): 30 mL via ORAL
  Filled 2014-10-05 (×2): qty 30

## 2014-10-05 MED ORDER — ONDANSETRON HCL 4 MG/2ML IJ SOLN
INTRAMUSCULAR | Status: AC
  Filled 2014-10-05: qty 2

## 2014-10-05 MED ORDER — IOHEXOL 300 MG/ML  SOLN
100.0000 mL | Freq: Once | INTRAMUSCULAR | Status: AC | PRN
  Administered 2014-10-05: 100 mL via INTRAVENOUS

## 2014-10-05 MED ORDER — ONDANSETRON HCL 4 MG/2ML IJ SOLN
4.0000 mg | Freq: Once | INTRAMUSCULAR | Status: AC
  Administered 2014-10-05: 4 mg via INTRAVENOUS

## 2014-10-05 MED ORDER — CIPROFLOXACIN IN D5W 400 MG/200ML IV SOLN
400.0000 mg | Freq: Two times a day (BID) | INTRAVENOUS | Status: DC
  Administered 2014-10-05 – 2014-10-07 (×4): 400 mg via INTRAVENOUS
  Filled 2014-10-05 (×4): qty 200

## 2014-10-05 MED ORDER — METRONIDAZOLE IN NACL 5-0.79 MG/ML-% IV SOLN
500.0000 mg | Freq: Once | INTRAVENOUS | Status: AC
  Administered 2014-10-05: 500 mg via INTRAVENOUS
  Filled 2014-10-05: qty 100

## 2014-10-05 MED ORDER — HYDROMORPHONE HCL 1 MG/ML IJ SOLN
1.0000 mg | INTRAMUSCULAR | Status: DC | PRN
  Administered 2014-10-05 – 2014-10-07 (×21): 1 mg via INTRAVENOUS
  Filled 2014-10-05 (×22): qty 1

## 2014-10-05 MED ORDER — PROMETHAZINE HCL 12.5 MG PO TABS: 12.5000 mg | ORAL_TABLET | Freq: Four times a day (QID) | ORAL | Status: DC | PRN

## 2014-10-05 MED ORDER — METRONIDAZOLE IN NACL 5-0.79 MG/ML-% IV SOLN
500.0000 mg | Freq: Three times a day (TID) | INTRAVENOUS | Status: DC
  Administered 2014-10-05 – 2014-10-07 (×6): 500 mg via INTRAVENOUS
  Filled 2014-10-05 (×6): qty 100

## 2014-10-05 MED ORDER — ONDANSETRON HCL 4 MG/2ML IJ SOLN: 4.0000 mg | Freq: Once | INTRAMUSCULAR | Status: DC

## 2014-10-05 NOTE — Care Management Note (Signed)
Case Management Note  Patient Details  Name: Marilyn Rivera MRN: [id number] Date of Birth: 24-Nov-1980  Subjective/Objective:                  Pt admitted from home with diverticulitis. Pt lives with her husband and will return home at discharge. Pt is independent with ADL's. Pt receives care at the [facility]. Pt also has follow up appt with surgeon in [place] for elective colectomy.  Action/Plan: No CM needs noted at this time.  Expected Discharge Date:                  Expected Discharge Plan:  Home/Self Care  In-House Referral:  Financial Counselor  Discharge planning Services  CM Consult  Post Acute Care Choice:  NA Choice offered to:  NA  DME Arranged:    DME Agency:     HH Arranged:    HH Agency:     Status of Service:  In process, will continue to follow  Medicare Important Message Given:    Date Medicare IM Given:    Medicare IM give by:    Date Additional Medicare IM Given:    Additional Medicare Important Message give by:     If discussed at [place] of Stay Meetings, dates discussed:    Additional Comments:  Joylene Draft, RN 10/05/2014, 1:20 PM

## 2014-10-05 NOTE — ED Provider Notes (Signed)
Patient left the changes shift to get results of CT scan. I've been hearing patient grunting from her room all shift. When I go in the room she is shaking her leg and grunting loudly. She states she has pain in her left lower quadrant. Her nurse accompany earlier because when he gave her 1 mg of Dilaudid it made her feel lightheaded. He then cut the dose down to a half a milligram which she states isn't helping her pain. She states this is her third episode of diverticulitis. We discussed her CT results. Her husband and she states this pain started last night and is "the worst she's ever had". Husband states she hasn't been able to eat for several days.  PCP none GI Dr Oneida Alar  Patient is morbidly obese and anxious. She is shaking her legs and grunting loudly.  04:10 Dr Shanon Brow, admit to obs, med-surg  Ct Abdomen Pelvis W Contrast  10/05/2014   CLINICAL DATA:  Left lower quadrant pain. Recent diverticulitis. Nausea.  EXAM: CT ABDOMEN AND PELVIS WITH CONTRAST  TECHNIQUE: Multidetector CT imaging of the abdomen and pelvis was performed using the standard protocol following bolus administration of intravenous contrast. Oral contrast was also administered.  CONTRAST:  2mL OMNIPAQUE IOHEXOL 300 MG/ML SOLN, 132mL OMNIPAQUE IOHEXOL 300 MG/ML SOLN  COMPARISON:  September 02, 2014  FINDINGS: There is mild bibasilar atelectatic change.  No focal liver lesions are identified. The gallbladder wall is not thickened. There is no biliary duct dilatation.  Spleen, pancreas, and adrenals appear normal.  Kidneys bilaterally show no mass or hydronephrosis. There is no renal or ureteral calculus on either side.  In the pelvis, the urinary bladder is midline with normal wall thickness. Uterus is anteverted. There is no pelvic mass.  In the proximal sigmoid colon, there remains mild bowel wall thickening with rather minimal surrounding mesenteric thickening consistent with a slight degree of diverticulitis. No abscess. No  microperforation. No other inflammation is seen in the pelvic region.  Appendix appears normal.  No bowel obstruction.  No free air or portal venous air.  There is no ascites, adenopathy, or abscess in the an or pelvis. There is no abdominal aortic aneurysm. There is degenerative change in the lumbar spine with vacuum phenomenon at L4-5 and L5-S1. There are no blastic or lytic bone lesions.  IMPRESSION: Slight inflammatory change in the proximal sigmoid colon region with mild bowel wall thickening. Question residual changes of diverticulitis compared to 1 month prior versus new flare of diverticulitis in this same area. No abscess or micro- perforation appreciable.  No abscess. No bowel obstruction. Appendix appears normal. No renal or ureteral calculus. No hydronephrosis.   Electronically Signed   By: Lowella Grip III M.D.   On: 10/05/2014 02:38    Diagnoses that have been ruled out:  None  Diagnoses that are still under consideration:  None  Final diagnoses:  LLQ abdominal pain  Diverticulitis of large intestine without perforation or abscess without bleeding   Plan admission   Rolland Porter, MD, Barbette Or, MD 10/05/14 [phone]

## 2014-10-05 NOTE — H&P (Signed)
PCP:   No PCP Per Patient   Chief Complaint:  abd pain  HPI: 34 yo female morbid obesity comes in with left lower quadrant abdominal pain for over a day.  Pt reports chills once, no fever.  Some diarrhea and vomiting.  Has had 2 recent bouts of diverticulitis, has seen surgery here who referred her to see surgery [facility] for partial colectomy.  She has appt with them on aug 24.  Ct scan today shows again diverticulitis.  Referred for admission for treatment of her 3rd episode.  Was just on abx about a month ago.  Review of Systems:  Positive and negative as per HPI otherwise all other systems are negative  Past Medical History: Past Medical History  Diagnosis Date  . Kidney stones   . GERD (gastroesophageal reflux disease)   . Diverticulosis    Past Surgical History  Procedure Laterality Date  . Knee surgery Right   . Renal calculi removal Left 2010  . Skin lesion excision      over right eyebrow due to wax being left above eye and it seeped down into pore  . Colonoscopy N/A 06/10/2014    Dr. Oneida Alar; redundant sigmoid colon, moderate diverticulosis in the sigmoid and descending colon. small internal hemorrhoids. Next screening at age 69.   Marland Kitchen Esophagogastroduodenoscopy N/A 06/10/2014    Dr. Oneida Alar: H.pylori gastritis s/p treatment with Amoxicillin and Biaxin    Medications: Prior to Admission medications   Medication Sig Start Date End Date Taking? Authorizing Provider  benzonatate (TESSALON) 100 MG capsule Take 100 mg by mouth 3 (three) times daily as needed for cough.    Historical Provider, MD  bisacodyl (DULCOLAX) 5 MG EC tablet Take 5 mg by mouth daily as needed for moderate constipation.    Historical Provider, MD  ciprofloxacin (CIPRO) 500 MG tablet One po bid x 10 days Patient not taking: Reported on 09/28/2014 09/02/14   Ripley Fraise, MD  docusate sodium (STOOL SOFTENER) 100 MG capsule Take 100 mg by mouth 2 (two) times daily.    Historical Provider, MD  flunisolide  (NASAREL) 29 MCG/ACT (0.025%) nasal spray Place 2 sprays into the nose 2 (two) times daily. Dose is for each nostril.    Historical Provider, MD  ibuprofen (ADVIL,MOTRIN) 200 MG tablet Take 400 mg by mouth every 6 (six) hours as needed for moderate pain or cramping.     Historical Provider, MD  loratadine (CLARITIN) 10 MG tablet Take 10 mg by mouth daily.    Historical Provider, MD  metroNIDAZOLE (FLAGYL) 500 MG tablet One po bid x 10 days 09/02/14   Ripley Fraise, MD  naproxen sodium (ANAPROX) 220 MG tablet Take 220 mg by mouth 2 (two) times daily with a meal.    Historical Provider, MD  ondansetron (ZOFRAN) 4 MG tablet Take 1 tablet (4 mg total) by mouth 4 (four) times daily -  with meals and at bedtime. 05/26/14   Orvil Feil, NP  oxyCODONE-acetaminophen (PERCOCET/ROXICET) 5-325 MG per tablet Take 1 tablet by mouth every 4 (four) hours as needed for severe pain. 09/28/14   Marlyce Huge, MD  pantoprazole (PROTONIX) 40 MG tablet Take 1 tablet (40 mg total) by mouth daily. 30 minutes before breakfast 08/04/14   Carlis Stable, NP    Allergies:  No Known Allergies  Social History:  reports that she has never smoked. She has never used smokeless tobacco. She reports that she does not drink alcohol or use illicit drugs.  Family  History: Family History  Problem Relation Age of Onset  . Heart failure Mother   . Cancer Other     mother's side of family, breast cancer  . Cancer Other     father's side of family, leukemia  . Hypertension Father   . Diabetes Other   . Hypertension Other   . Colon cancer Neg Hx     Physical Exam: Filed Vitals:   10/04/14 2119 10/05/14 0022 10/05/14 0324  BP: 158/104  143/71  Pulse: 90  90  Temp: 98.2 F (36.8 C)  97.5 F (36.4 C)  TempSrc:   Oral  Resp: 20  22  Height: 5\' 10"  (1.778 m)    Weight: 177.356 kg (391 lb) 179.341 kg (395 lb 6 oz)   SpO2: 100%  98%   General appearance: alert, cooperative and no distress Head: Normocephalic, without  obvious abnormality, atraumatic Eyes: negative Nose: Nares normal. Septum midline. Mucosa normal. No drainage or sinus tenderness. Neck: no JVD and supple, symmetrical, trachea midline Lungs: clear to auscultation bilaterally Heart: regular rate and rhythm, S1, S2 normal, no murmur, click, rub or gallop Abdomen: tender all over with minimal touch, nd, obese, pos bs, nonacute exam Extremities: extremities normal, atraumatic, no cyanosis or edema Pulses: 2+ and symmetric Skin: Skin color, texture, turgor normal. No rashes or lesions Neurologic: Grossly normal   Labs on Admission:   Recent Labs  10/04/14 2255  NA 135  K 3.5  CL 105  CO2 24  GLUCOSE 146*  BUN 10  CREATININE 0.92  CALCIUM 8.6*    Recent Labs  10/04/14 2255  AST 18  ALT 17  ALKPHOS 69  BILITOT 0.2*  PROT 7.5  ALBUMIN 3.5    Recent Labs  10/04/14 2255  LIPASE 22    Recent Labs  10/04/14 2255  WBC 11.1*  NEUTROABS 8.5*  HGB 12.3  HCT 38.4  MCV 80.0  PLT 195   Radiological Exams on Admission: Ct Abdomen Pelvis W Contrast  10/05/2014   CLINICAL DATA:  Left lower quadrant pain. Recent diverticulitis. Nausea.  EXAM: CT ABDOMEN AND PELVIS WITH CONTRAST  TECHNIQUE: Multidetector CT imaging of the abdomen and pelvis was performed using the standard protocol following bolus administration of intravenous contrast. Oral contrast was also administered.  CONTRAST:  37mL OMNIPAQUE IOHEXOL 300 MG/ML SOLN, 137mL OMNIPAQUE IOHEXOL 300 MG/ML SOLN  COMPARISON:  September 02, 2014  FINDINGS: There is mild bibasilar atelectatic change.  No focal liver lesions are identified. The gallbladder wall is not thickened. There is no biliary duct dilatation.  Spleen, pancreas, and adrenals appear normal.  Kidneys bilaterally show no mass or hydronephrosis. There is no renal or ureteral calculus on either side.  In the pelvis, the urinary bladder is midline with normal wall thickness. Uterus is anteverted. There is no pelvic mass.  In  the proximal sigmoid colon, there remains mild bowel wall thickening with rather minimal surrounding mesenteric thickening consistent with a slight degree of diverticulitis. No abscess. No microperforation. No other inflammation is seen in the pelvic region.  Appendix appears normal.  No bowel obstruction.  No free air or portal venous air.  There is no ascites, adenopathy, or abscess in the an or pelvis. There is no abdominal aortic aneurysm. There is degenerative change in the lumbar spine with vacuum phenomenon at L4-5 and L5-S1. There are no blastic or lytic bone lesions.  IMPRESSION: Slight inflammatory change in the proximal sigmoid colon region with mild bowel wall thickening. Question residual changes  of diverticulitis compared to 1 month prior versus new flare of diverticulitis in this same area. No abscess or micro- perforation appreciable.  No abscess. No bowel obstruction. Appendix appears normal. No renal or ureteral calculus. No hydronephrosis.   Electronically Signed   By: Lowella Grip III M.D.   On: 10/05/2014 02:38   Case discussed with dr Tomi Bamberger [facility] records reviewed  Assessment/Plan  34 yo female with recurrent diverticulitis  Principal Problem:   Diverticulitis large intestine w/o perforation or abscess w/o bleeding-  Iv cipro and flagyl.  Has appt with surgery at [facility] on aug 24 for elective partial colectomy possibly.  No emergent needs at this time for surgical intervention unless she worsens despite antibiotics.  Active Problems:   Morbid obesity- noted   Normocytic anemia - noted   Nausea without vomiting - prn zorfran   Suspect supratentorial aspect- very exaggerated response to even light tough- noted  obs on medical bed.  Full code.    Angelette Ganus A 10/05/2014, 5:05 AM

## 2014-10-05 NOTE — ED Notes (Signed)
Pt ambulated to restroom & returned to room w/ no complications. 

## 2014-10-05 NOTE — Progress Notes (Signed)
[facility] PROGRESS NOTE  Marilyn Rivera HYW:[id number] DOB: Nov 13, 1980 DOA: 10/04/2014 PCP: No PCP Per Patient  Assessment/Plan:  Diverticulitis - start liquids and if tolerated, increase diet - Consider changing antibiotics to PO if tolerates diet - Follow up with surgeon in [place] as scheduled - symptomatic management of nausea and pain   Morbid Obesity   Code Status: FULL DVT prophylaxis: Heparin Family Communication: Husband Barbaraann Rondo, at bedside. Care plan was discussed and there are no questions at this time  Disposition Plan: home once improved   Consultants:  none  Antibiotics:  Cipro 8/10 >>  Flagyl 8/10 >>  Subjective: Reports she is in pain.  No diarrhea or SOB. Reports pain to be similar to previous episodes but worse. Intermittently nauseated and one episode of emesis last night.     Objective: Filed Vitals:   10/04/14 2119 10/05/14 0022 10/05/14 0324 10/05/14 0547  BP: 158/104  143/71 111/55  Pulse: 90  90 90  Temp: 98.2 F (36.8 C)  97.5 F (36.4 C) 98.9 F (37.2 C)  TempSrc:   Oral Oral  Resp: 20  22   Height: 5\' 10"  (1.778 m)     Weight: 177.356 kg (391 lb) 179.341 kg (395 lb 6 oz)    SpO2: 100%  98% 97%    Intake/Output Summary (Last 24 hours) at 10/05/14 0807 Last data filed at 10/05/14 0101  Gross per 24 hour  Intake   1000 ml  Output      0 ml  Net   1000 ml   Filed Weights   10/04/14 2119 10/05/14 0022  Weight: 177.356 kg (391 lb) 179.341 kg (395 lb 6 oz)    Exam:   General:  NAD, appears calm and comfortable, lying in bed, afebrile  Cardiovascular: RRR, no m/r/g  Respiratory: CTAB, no w/r/r  Abdomen: soft, positive bowel sounds, tender to palpation, no distension  Extremities: no LE edema  Neurologic:  Non focal  Data Reviewed: Basic Metabolic Panel:  Recent Labs Lab 10/04/14 2255  NA 135  K 3.5  CL 105  CO2 24  GLUCOSE 146*  BUN 10  CREATININE 0.92  CALCIUM 8.6*   Liver Function  Tests:  Recent Labs Lab 10/04/14 2255  AST 18  ALT 17  ALKPHOS 69  BILITOT 0.2*  PROT 7.5  ALBUMIN 3.5    Recent Labs Lab 10/04/14 2255  LIPASE 22   No results for input(s): AMMONIA in the last 168 hours. CBC:  Recent Labs Lab 10/04/14 2255  WBC 11.1*  NEUTROABS 8.5*  HGB 12.3  HCT 38.4  MCV 80.0  PLT 195   Cardiac Enzymes: No results for input(s): CKTOTAL, CKMB, CKMBINDEX, TROPONINI in the last 168 hours. BNP (last 3 results) No results for input(s): BNP in the last 8760 hours.  ProBNP (last 3 results) No results for input(s): PROBNP in the last 8760 hours.  CBG: No results for input(s): GLUCAP in the last 168 hours.  No results found for this or any previous visit (from the past 240 hour(s)).   Studies: Ct Abdomen Pelvis W Contrast  10/05/2014   CLINICAL DATA:  Left lower quadrant pain. Recent diverticulitis. Nausea.  EXAM: CT ABDOMEN AND PELVIS WITH CONTRAST  TECHNIQUE: Multidetector CT imaging of the abdomen and pelvis was performed using the standard protocol following bolus administration of intravenous contrast. Oral contrast was also administered.  CONTRAST:  70mL OMNIPAQUE IOHEXOL 300 MG/ML SOLN, 145mL OMNIPAQUE IOHEXOL 300 MG/ML SOLN  COMPARISON:  September 02, 2014  FINDINGS: There is mild bibasilar atelectatic change.  No focal liver lesions are identified. The gallbladder wall is not thickened. There is no biliary duct dilatation.  Spleen, pancreas, and adrenals appear normal.  Kidneys bilaterally show no mass or hydronephrosis. There is no renal or ureteral calculus on either side.  In the pelvis, the urinary bladder is midline with normal wall thickness. Uterus is anteverted. There is no pelvic mass.  In the proximal sigmoid colon, there remains mild bowel wall thickening with rather minimal surrounding mesenteric thickening consistent with a slight degree of diverticulitis. No abscess. No microperforation. No other inflammation is seen in the pelvic region.   Appendix appears normal.  No bowel obstruction.  No free air or portal venous air.  There is no ascites, adenopathy, or abscess in the an or pelvis. There is no abdominal aortic aneurysm. There is degenerative change in the lumbar spine with vacuum phenomenon at L4-5 and L5-S1. There are no blastic or lytic bone lesions.  IMPRESSION: Slight inflammatory change in the proximal sigmoid colon region with mild bowel wall thickening. Question residual changes of diverticulitis compared to 1 month prior versus new flare of diverticulitis in this same area. No abscess or micro- perforation appreciable.  No abscess. No bowel obstruction. Appendix appears normal. No renal or ureteral calculus. No hydronephrosis.   Electronically Signed   By: Lowella Grip III M.D.   On: 10/05/2014 02:38    Scheduled Meds: . ciprofloxacin  400 mg Intravenous Q12H  . metronidazole  500 mg Intravenous Q8H   Continuous Infusions: . sodium chloride    . sodium chloride      Principal Problem:   Diverticulitis large intestine w/o perforation or abscess w/o bleeding Active Problems:   Morbid obesity   Normocytic anemia   Nausea without vomiting   Diverticulitis of colon    Time spent: 25 minutes. Greater than 50% of this time was spent in direct contact with the patient coordinating care.    Domingo Mend, MD [facility] Pager: [phone]  If 7PM-7AM, please contact night-coverage at [email or web address], password [facility] 10/05/2014, 8:07 AM      I, Salvadore Oxford, acting a scribe, recorded this note contemporaneously in the presence of Dr. Domingo Mend on 10/05/2014  I have reviewed the above documentation for accuracy and completeness, and I agree with the above.  Domingo Mend, MD [facility] Pager: [phone]

## 2014-10-06 ENCOUNTER — Telehealth: Payer: Self-pay | Admitting: Gastroenterology

## 2014-10-06 DIAGNOSIS — R110 Nausea: Secondary | ICD-10-CM

## 2014-10-06 LAB — CBC
HEMATOCRIT: 36.4 % (ref 36.0–46.0)
Hemoglobin: 11.3 g/dL — ABNORMAL LOW (ref 12.0–15.0)
MCH: 25.3 pg — ABNORMAL LOW (ref 26.0–34.0)
MCHC: 31 g/dL (ref 30.0–36.0)
MCV: 81.6 fL (ref 78.0–100.0)
Platelets: 189 10*3/uL (ref 150–400)
RBC: 4.46 MIL/uL (ref 3.87–5.11)
RDW: 18.3 % — AB (ref 11.5–15.5)
WBC: 10.5 10*3/uL (ref 4.0–10.5)

## 2014-10-06 LAB — BASIC METABOLIC PANEL
Anion gap: 6 (ref 5–15)
BUN: 7 mg/dL (ref 6–20)
CALCIUM: 7.9 mg/dL — AB (ref 8.9–10.3)
CHLORIDE: 106 mmol/L (ref 101–111)
CO2: 25 mmol/L (ref 22–32)
CREATININE: 0.84 mg/dL (ref 0.44–1.00)
GFR calc Af Amer: >60 mL/min (ref >60)
GFR calc non Af Amer: >60 mL/min (ref >60)
GLUCOSE: 109 mg/dL — AB (ref 65–99)
Potassium: 3.7 mmol/L (ref 3.5–5.1)
Sodium: 137 mmol/L (ref 135–145)

## 2014-10-06 MED ORDER — OXYCODONE HCL 5 MG PO TABS
5.0000 mg | ORAL_TABLET | Freq: Four times a day (QID) | ORAL | Status: DC | PRN
  Administered 2014-10-06 – 2014-10-07 (×5): 5 mg via ORAL
  Filled 2014-10-06 (×5): qty 1

## 2014-10-06 NOTE — Telephone Encounter (Signed)
I called and explained to the husband.

## 2014-10-06 NOTE — Telephone Encounter (Signed)
PATIENT HUSBAND CALLED [phone]   AND STATED THAT HIS WIFE HAS BEEN IN THE HOSPITAL SINCE Tuesday REGARDING HER DIVERTICULITIS AND WAS CONCERNED THAT NO ONE FROM THIS OFFICE HAS BEEN TOLD TO CHECK ON HER IN THE HOSPITAL.

## 2014-10-06 NOTE — Progress Notes (Signed)
[facility] PROGRESS NOTE  Marilyn Rivera TTS:[id number] DOB: 25-Jan-1981 DOA: 10/04/2014 PCP: No PCP Per Patient  Assessment/Plan:  Diverticulitis - Increase diet, as tolerated. - Add pain medications to PO, as tolerated, saving IV Dilaudid for break through pain.  -Continue IV abx, until we are sure she is tolerating oral medications.  - Follow up with surgeon in [place] as scheduled - symptomatic management of nausea and pain   Morbid Obesity   Code Status: FULL DVT prophylaxis: Heparin Family Communication: Husband Barbaraann Rondo, at bedside. Care plan was discussed and there are no questions at this time 8/11.  Disposition Plan: Anticipate discharge in 24 hours.   Consultants:  none  Antibiotics:  Cipro 8/10 >>  Flagyl 8/10 >>  Subjective: Still in pain. Was not able to drink much and the little she was able to drink she vomited. No reports of chest pain, shortness of breath.   Objective: Filed Vitals:   10/05/14 0547 10/05/14 1310 10/05/14 2157 10/06/14 0621  BP: 111/55 146/81 168/74 143/87  Pulse: 90 76 73 80  Temp: 98.9 F (37.2 C) 98.9 F (37.2 C) 99 F (37.2 C) 98.7 F (37.1 C)  TempSrc: Oral Oral Oral Oral  Resp:  20 20 20   Height:      Weight:    175.905 kg (387 lb 12.8 oz)  SpO2: 97% 99% 100% 100%    Intake/Output Summary (Last 24 hours) at 10/06/14 0728 Last data filed at 10/05/14 1900  Gross per 24 hour  Intake 1228.33 ml  Output      0 ml  Net 1228.33 ml   Filed Weights   10/04/14 2119 10/05/14 0022 10/06/14 0621  Weight: 177.356 kg (391 lb) 179.341 kg (395 lb 6 oz) 175.905 kg (387 lb 12.8 oz)    Exam:  General:  NAD, appears calm and comfortable, lying in bed, afebrile.  Cardiovascular: RRR, no m/r/g  Respiratory: CTAB, no w/r/r  Abdomen: soft, positive bowel sounds, no distension, tender to palpation LLQ and infraumbilical.  Extremities: Trace edema BLE  Neurologic:  Non focal  Data Reviewed: Basic Metabolic  Panel:  Recent Labs Lab 10/04/14 2255 10/06/14 0609  NA 135 137  K 3.5 3.7  CL 105 106  CO2 24 25  GLUCOSE 146* 109*  BUN 10 7  CREATININE 0.92 0.84  CALCIUM 8.6* 7.9*   Liver Function Tests:  Recent Labs Lab 10/04/14 2255  AST 18  ALT 17  ALKPHOS 69  BILITOT 0.2*  PROT 7.5  ALBUMIN 3.5    Recent Labs Lab 10/04/14 2255  LIPASE 22   No results for input(s): AMMONIA in the last 168 hours. CBC:  Recent Labs Lab 10/04/14 2255 10/06/14 0609  WBC 11.1* 10.5  NEUTROABS 8.5*  --   HGB 12.3 11.3*  HCT 38.4 36.4  MCV 80.0 81.6  PLT 195 189   Cardiac Enzymes: No results for input(s): CKTOTAL, CKMB, CKMBINDEX, TROPONINI in the last 168 hours. BNP (last 3 results) No results for input(s): BNP in the last 8760 hours.  ProBNP (last 3 results) No results for input(s): PROBNP in the last 8760 hours.  CBG: No results for input(s): GLUCAP in the last 168 hours.  No results found for this or any previous visit (from the past 240 hour(s)).   Studies: Ct Abdomen Pelvis W Contrast  10/05/2014   CLINICAL DATA:  Left lower quadrant pain. Recent diverticulitis. Nausea.  EXAM: CT ABDOMEN AND PELVIS WITH CONTRAST  TECHNIQUE: Multidetector CT imaging of the  abdomen and pelvis was performed using the standard protocol following bolus administration of intravenous contrast. Oral contrast was also administered.  CONTRAST:  30mL OMNIPAQUE IOHEXOL 300 MG/ML SOLN, 125mL OMNIPAQUE IOHEXOL 300 MG/ML SOLN  COMPARISON:  September 02, 2014  FINDINGS: There is mild bibasilar atelectatic change.  No focal liver lesions are identified. The gallbladder wall is not thickened. There is no biliary duct dilatation.  Spleen, pancreas, and adrenals appear normal.  Kidneys bilaterally show no mass or hydronephrosis. There is no renal or ureteral calculus on either side.  In the pelvis, the urinary bladder is midline with normal wall thickness. Uterus is anteverted. There is no pelvic mass.  In the proximal  sigmoid colon, there remains mild bowel wall thickening with rather minimal surrounding mesenteric thickening consistent with a slight degree of diverticulitis. No abscess. No microperforation. No other inflammation is seen in the pelvic region.  Appendix appears normal.  No bowel obstruction.  No free air or portal venous air.  There is no ascites, adenopathy, or abscess in the an or pelvis. There is no abdominal aortic aneurysm. There is degenerative change in the lumbar spine with vacuum phenomenon at L4-5 and L5-S1. There are no blastic or lytic bone lesions.  IMPRESSION: Slight inflammatory change in the proximal sigmoid colon region with mild bowel wall thickening. Question residual changes of diverticulitis compared to 1 month prior versus new flare of diverticulitis in this same area. No abscess or micro- perforation appreciable.  No abscess. No bowel obstruction. Appendix appears normal. No renal or ureteral calculus. No hydronephrosis.   Electronically Signed   By: Lowella Grip III M.D.   On: 10/05/2014 02:38    Scheduled Meds: . ciprofloxacin  400 mg Intravenous Q12H  . heparin subcutaneous  5,000 Units Subcutaneous 3 times per day  . metronidazole  500 mg Intravenous Q8H  . pantoprazole (PROTONIX) IV  40 mg Intravenous Q24H   Continuous Infusions: . sodium chloride 100 mL/hr at 10/06/14 0215    Principal Problem:   Diverticulitis large intestine w/o perforation or abscess w/o bleeding Active Problems:   Morbid obesity   Normocytic anemia   Nausea without vomiting   Diverticulitis of colon    Time spent: 25 minutes. Greater than 50% of this time was spent in direct contact with the patient coordinating care.    Domingo Mend, MD [facility] Pager: [phone]  If 7PM-7AM, please contact night-coverage at [email or web address], password [facility] 10/06/2014, 7:28 AM  LOS: 1 day     I, Jessica D. Leonie Green, acting as scribe, recorded this note contemporaneously in the  presence of Dr. Lelon Frohlich, M.D. on 10/06/2014.  I have reviewed the above documentation for accuracy and completeness, and I agree with the above.  Domingo Mend, MD [facility] Pager: [phone]

## 2014-10-06 NOTE — Telephone Encounter (Signed)
Routing to Dr. Fields.  

## 2014-10-06 NOTE — Telephone Encounter (Signed)
PLEASE CALL PT. I REVIEWED HER CT. SHE HAS UNCOMPLICATED DIVERTICULITIS AND DOESN'T NEED A GI CONSULT IN HOUSE. SHE NEEDS TO HAVE HER SIGMOID COLON REMOVED. SHE SHOULD SEE SURGERY TO DISCUSS BUT THEY WILL WANT THIS EPISODE TO RESOLVE BEFORE DOING SURGERY.

## 2014-10-07 LAB — CBC
HEMATOCRIT: 35.8 % — AB (ref 36.0–46.0)
HEMOGLOBIN: 11.2 g/dL — AB (ref 12.0–15.0)
MCH: 25.5 pg — ABNORMAL LOW (ref 26.0–34.0)
MCHC: 31.3 g/dL (ref 30.0–36.0)
MCV: 81.4 fL (ref 78.0–100.0)
Platelets: 194 10*3/uL (ref 150–400)
RBC: 4.4 MIL/uL (ref 3.87–5.11)
RDW: 18.4 % — ABNORMAL HIGH (ref 11.5–15.5)
WBC: 10.8 10*3/uL — ABNORMAL HIGH (ref 4.0–10.5)

## 2014-10-07 LAB — BASIC METABOLIC PANEL
Anion gap: 5 (ref 5–15)
BUN: 6 mg/dL (ref 6–20)
CO2: 26 mmol/L (ref 22–32)
Calcium: 8.2 mg/dL — ABNORMAL LOW (ref 8.9–10.3)
Chloride: 108 mmol/L (ref 101–111)
Creatinine, Ser: 0.81 mg/dL (ref 0.44–1.00)
GFR calc Af Amer: >60 mL/min (ref >60)
GFR calc non Af Amer: >60 mL/min (ref >60)
Glucose, Bld: 101 mg/dL — ABNORMAL HIGH (ref 65–99)
POTASSIUM: 3.6 mmol/L (ref 3.5–5.1)
SODIUM: 139 mmol/L (ref 135–145)

## 2014-10-07 MED ORDER — METRONIDAZOLE 500 MG PO TABS: 500.0000 mg | ORAL_TABLET | Freq: Three times a day (TID) | ORAL | Status: DC

## 2014-10-07 MED ORDER — CIPROFLOXACIN HCL 500 MG PO TABS: 500.0000 mg | ORAL_TABLET | Freq: Two times a day (BID) | ORAL | Status: DC

## 2014-10-07 MED ORDER — ONDANSETRON HCL 8 MG PO TABS: 8.0000 mg | ORAL_TABLET | Freq: Three times a day (TID) | ORAL | Status: DC | PRN

## 2014-10-07 MED ORDER — OXYCODONE HCL 5 MG PO TABS: 5.0000 mg | ORAL_TABLET | Freq: Four times a day (QID) | ORAL | Status: DC | PRN

## 2014-10-07 NOTE — Progress Notes (Signed)
1445 d/c instructions, paperwork and hard Rxs given to patient and patient's husband. Hard Rx's faxed to [facility] as requested by patient. IV catheter removed from LEFT UE, catheter intact, no s/s of infection noted, small bruise noted distal to catheter site, patient tolerated well w/no c/o pain or discomfort noted. Patient assisted to vehicle via w/c by staff.

## 2014-10-07 NOTE — Care Management Note (Signed)
Case Management Note  Patient Details  Name: Marilyn Rivera MRN: [id number] Date of Birth: 02-25-1981  Expected Discharge Date:                  Expected Discharge Plan:  Home/Self Care  In-House Referral:  Financial Counselor  Discharge planning Services  CM Consult  Post Acute Care Choice:  NA Choice offered to:  NA  DME Arranged:    DME Agency:     HH Arranged:    [place]:     Status of Service:  Completed, signed off  Medicare Important Message Given:    Date Medicare IM Given:    Medicare IM give by:    Date Additional Medicare IM Given:    Additional Medicare Important Message give by:     If discussed at [place] of Stay Meetings, dates discussed:    Additional Comments: Pt discharging home with self care. Unit secretary to made f/u appointment with [facility] health department. Patient's meds are on $4 list at [facility] or are very cheap. Pt has been referred to the [facility]. No further CM needs.  Sherald Barge, RN 10/07/2014, 2:32 PM

## 2014-10-07 NOTE — Discharge Summary (Signed)
Physician Discharge Summary  ADMIRE Marilyn Rivera NFA:[id number] DOB: 10/06/1980 DOA: 10/04/2014  PCP: No PCP Per Patient  Admit date: 10/04/2014 Discharge date: 10/07/2014  Time spent: 35 minutes  Recommendations for Outpatient Follow-up:  1. Continue Cipro and Flagyl for 14 days. 2. Follow up with surgeon in [place] as scheduled 3. Take antiemetics as needed for N/V 4. Take pain medication as needed   Discharge Diagnoses:  Principal Problem:   Diverticulitis large intestine w/o perforation or abscess w/o bleeding Active Problems:   Morbid obesity   Normocytic anemia   Nausea without vomiting   Diverticulitis of colon   Discharge Condition: Improved  Diet recommendation: Low Residue   Filed Weights   10/05/14 0022 10/06/14 0621 10/07/14 0603  Weight: 179.341 kg (395 lb 6 oz) 175.905 kg (387 lb 12.8 oz) 175.406 kg (386 lb 11.2 oz)    History of present illness:  34 yo female morbid obesity comes in with left lower quadrant abdominal pain for over a day. Pt reports chills once, no fever. Some diarrhea and vomiting. Has had 2 recent bouts of diverticulitis, has seen surgery here who referred her to see surgery at [facility] for partial colectomy. She has appt with them on aug 24. Ct scan today shows again diverticulitis. Referred for admission for treatment of her 3rd episode. Was just on abx about a month ago.  Hospital Course:   Diverticulitis -  Tolerating diet - Will discharge on 14 days of Cipro and Flagyl - Follow up with surgeon in [place] as scheduled for consideration of partial colectomy given repeated episodes of Diverticulitis -Suspect component of over exaggeration  of pain with expectation of pain medication -Given CT scan was concerning for Diverticulitis will discharge with 30 pills of oxycodone  Morbid Obesity  Procedures:  None  Consultations:  none    Discharge Instructions   Current Discharge Medication List    CONTINUE these medications  which have NOT CHANGED   Details  omeprazole (PRILOSEC) 20 MG capsule Take 20 mg by mouth daily.    oxyCODONE-acetaminophen (PERCOCET/ROXICET) 5-325 MG per tablet Take 1 tablet by mouth every 4 (four) hours as needed for severe pain. Qty: 30 tablet, Refills: 0       No Known Allergies    The results of significant diagnostics from this hospitalization (including imaging, microbiology, ancillary and laboratory) are listed below for reference.    Significant Diagnostic Studies: Ct Abdomen Pelvis W Contrast  10/05/2014   CLINICAL DATA:  Left lower quadrant pain. Recent diverticulitis. Nausea.  EXAM: CT ABDOMEN AND PELVIS WITH CONTRAST  TECHNIQUE: Multidetector CT imaging of the abdomen and pelvis was performed using the standard protocol following bolus administration of intravenous contrast. Oral contrast was also administered.  CONTRAST:  50mL OMNIPAQUE IOHEXOL 300 MG/ML SOLN, 140mL OMNIPAQUE IOHEXOL 300 MG/ML SOLN  COMPARISON:  September 02, 2014  FINDINGS: There is mild bibasilar atelectatic change.  No focal liver lesions are identified. The gallbladder wall is not thickened. There is no biliary duct dilatation.  Spleen, pancreas, and adrenals appear normal.  Kidneys bilaterally show no mass or hydronephrosis. There is no renal or ureteral calculus on either side.  In the pelvis, the urinary bladder is midline with normal wall thickness. Uterus is anteverted. There is no pelvic mass.  In the proximal sigmoid colon, there remains mild bowel wall thickening with rather minimal surrounding mesenteric thickening consistent with a slight degree of diverticulitis. No abscess. No microperforation. No other inflammation is seen in the pelvic region.  Appendix appears normal.  No bowel obstruction.  No free air or portal venous air.  There is no ascites, adenopathy, or abscess in the an or pelvis. There is no abdominal aortic aneurysm. There is degenerative change in the lumbar spine with vacuum phenomenon at  L4-5 and L5-S1. There are no blastic or lytic bone lesions.  IMPRESSION: Slight inflammatory change in the proximal sigmoid colon region with mild bowel wall thickening. Question residual changes of diverticulitis compared to 1 month prior versus new flare of diverticulitis in this same area. No abscess or micro- perforation appreciable.  No abscess. No bowel obstruction. Appendix appears normal. No renal or ureteral calculus. No hydronephrosis.   Electronically Signed   By: Lowella Grip III M.D.   On: 10/05/2014 02:38    Microbiology: No results found for this or any previous visit (from the past 240 hour(s)).   Labs: Basic Metabolic Panel:  Recent Labs Lab 10/04/14 2255 10/06/14 0609 10/07/14 0545  NA 135 137 139  K 3.5 3.7 3.6  CL 105 106 108  CO2 24 25 26   GLUCOSE 146* 109* 101*  BUN 10 7 6   CREATININE 0.92 0.84 0.81  CALCIUM 8.6* 7.9* 8.2*   Liver Function Tests:  Recent Labs Lab 10/04/14 2255  AST 18  ALT 17  ALKPHOS 69  BILITOT 0.2*  PROT 7.5  ALBUMIN 3.5    Recent Labs Lab 10/04/14 2255  LIPASE 22   No results for input(s): AMMONIA in the last 168 hours. CBC:  Recent Labs Lab 10/04/14 2255 10/06/14 0609 10/07/14 0545  WBC 11.1* 10.5 10.8*  NEUTROABS 8.5*  --   --   HGB 12.3 11.3* 11.2*  HCT 38.4 36.4 35.8*  MCV 80.0 81.6 81.4  PLT 195 189 194    Signed:  Domingo Mend, MD   [facility] 10/07/2014, 8:06 AM   I, Laban Emperor. Leonie Green, acting as scribe, recorded this note contemporaneously in the presence of Dr. Lelon Frohlich, M.D. on 10/07/2014.   I have reviewed the above documentation for accuracy and completeness, and I agree with the above.  Domingo Mend, MD [facility] Pager: [phone]

## 2014-10-07 NOTE — Progress Notes (Signed)
Was consulted to give education on low residue diet. Unfortunately, on RD arrival, pt was seen writhing/moaning in agony and could not participate at all. Spoke to other individual in room who stated pt would not be able to talk with me, but they have been educated on the diet in the past and they understand it. Left handout "Low Fiber Nutrition Therapy"  No further nutrition interventions warranted at this time. If nutrition issues arise, please consult RD. Will complete consult.  Burtis Junes RD, LDN Nutrition Pager: [phone] 10/07/2014 12:21 PM

## 2014-10-10 ENCOUNTER — Ambulatory Visit: Payer: Self-pay | Admitting: Gastroenterology

## 2014-11-07 ENCOUNTER — Encounter: Payer: Self-pay | Admitting: Gastroenterology

## 2015-04-05 ENCOUNTER — Encounter: Payer: Self-pay | Admitting: Gastroenterology

## 2015-04-05 ENCOUNTER — Other Ambulatory Visit: Payer: Self-pay

## 2015-04-05 ENCOUNTER — Ambulatory Visit (INDEPENDENT_AMBULATORY_CARE_PROVIDER_SITE_OTHER): Payer: 59 | Admitting: Gastroenterology

## 2015-04-05 VITALS — BP 156/88 | HR 75 | Temp 97.6°F | Ht 66.0 in | Wt >= 6400 oz

## 2015-04-05 DIAGNOSIS — R110 Nausea: Secondary | ICD-10-CM

## 2015-04-05 DIAGNOSIS — K5732 Diverticulitis of large intestine without perforation or abscess without bleeding: Secondary | ICD-10-CM

## 2015-04-05 DIAGNOSIS — J3489 Other specified disorders of nose and nasal sinuses: Secondary | ICD-10-CM

## 2015-04-05 MED ORDER — PANTOPRAZOLE SODIUM 40 MG PO TBEC: DELAYED_RELEASE_TABLET | ORAL | Status: DC

## 2015-04-05 NOTE — Progress Notes (Signed)
NO PCP PER PATIENT °

## 2015-04-05 NOTE — Progress Notes (Signed)
ON RECALL  °

## 2015-04-05 NOTE — Assessment & Plan Note (Signed)
SYMPTOMS CONTROLLED/RESOLVED.  CONTINUE TO MONITOR SYMPTOMS. 

## 2015-04-05 NOTE — Assessment & Plan Note (Signed)
Most likely due to posterior nasal drainage/reflux.  SEE the ears/nose/throat/sinus doctor FOR AN EVALUATION. USE NETI POT BEFORE BED AND CONSIDER USING IT FIRST THING IN THE MORNING. AVOID REFLUX TRIGGERS.  HANDOUT GIVEN.. CONTINUE PROTONIX. TAKE 30 MINUTES PRIOR TO BREAKFAST. FOLLOW UP IN 4 MOS.

## 2015-04-05 NOTE — Progress Notes (Addendum)
   Subjective:    Patient ID: Marilyn Rivera, female    DOB: 12/26/1980, 35 y.o.   MRN: [id number]  No PCP Per Patient   HPI  NAUSEA OFTEN. TAKES ACID REFLUX MEDICINE. FEELS NAUSEATED 2-3 TIMES A DAY. LASTS A GOOD MIN. THINKS ABX HELPED. NO MORE PAIN LIEK BEFORE. LAST TIME IT WAS PRETTY BAD. THINGS ARE BETTER. BLOATING BETTER. NEVER WENT AWAY COMPLETELY. NO VOMITING, DYSURIA,  HEMATURIA. RARE RINGING IN R EAR. ALWAYS HAS SINUSES. IT DRAINS AND SHE HAS TO COUGH TO MAKE IT COME UP. NEVER SEEN ALLERGY OR ENT. HAD NOSE BLEEDS REALLY BAD FOR ABOUT ONE MO. HUMIDIFIER DIDN'T HELP. WHEN SHE BLOWS HER NOSE STILL SEES BLOOD IN THE TISSUE. SX FOR 3-4 MOS BETTER BUT NOT RESOLVED. NO SMOKERS IN HOUSE BUT PARENTS SMOKE: ONCE A WEEK. LIVES IN [place]. LIVES IN A MOBILE HOME. NO PETS.  BMs: 3-4 TIMES #4. HEARTBURN: EVERY COUPLE DAYS. TRIGGERS-?BROCCOLI, PEPPERS, ONIONS.  DOESN'T SNEEZE A LOT.   PT DENIES FEVER, CHILLS, HEMATOCHEZIA, melena, diarrhea, CHEST PAIN, SHORTNESS OF BREATH, CHANGE IN BOWEL IN HABITS, constipation, abdominal pain, problems swallowing, OR problems with sedation.   Past Medical History  Diagnosis Date  . Kidney stones   . GERD (gastroesophageal reflux disease)   . Diverticulosis    Past Surgical History  Procedure Laterality Date  . Knee surgery Right   . Renal calculi removal Left 2010  . Skin lesion excision      over right eyebrow due to wax being left above eye and it seeped down into pore  . Colonoscopy N/A 06/10/2014    Dr. Oneida Alar; redundant sigmoid colon, moderate diverticulosis in the sigmoid and descending colon. small internal hemorrhoids. Next screening at age 44.   Marland Kitchen Esophagogastroduodenoscopy N/A 06/10/2014    Dr. Oneida Alar: H.pylori gastritis s/p treatment with Amoxicillin and Biaxin   No Known Allergies  Current Outpatient Prescriptions  Medication Sig Dispense Refill  . IBUPROFEN 200 MG tablet Take 400 mg by mouth every 6 (six) hours as needed. ONCE A DAY   .  ACID REFLUX MEDICINE  QD   .      Marland Kitchen      .      .     Review of Systems PER HPI OTHERWISE ALL SYSTEMS ARE NEGATIVE.    Objective:   Physical Exam  Constitutional: She is oriented to person, place, and time. She appears well-developed and well-nourished. No distress.  HENT:  Head: Normocephalic and atraumatic.  Mouth/Throat: Oropharynx is clear and moist. No oropharyngeal exudate.  Eyes: Pupils are equal, round, and reactive to light. No scleral icterus.  Neck: Normal range of motion. Neck supple.  Cardiovascular: Normal rate, regular rhythm and normal heart sounds.   Pulmonary/Chest: Effort normal and breath sounds normal. No respiratory distress.  Abdominal: Soft. Bowel sounds are normal. She exhibits no distension. There is no tenderness.  Musculoskeletal: She exhibits no edema.  Lymphadenopathy:    She has no cervical adenopathy.  Neurological: She is alert and oriented to person, place, and time.  Psychiatric: She has a normal mood and affect.  Vitals reviewed.         Assessment & Plan:

## 2015-04-05 NOTE — Patient Instructions (Addendum)
Your nausea is most likely related to your posterior nasal/sinus drainage and REFLUX.   SEE the ears/nose/throat/sinus doctor FOR AN EVALUATION.  USE NETI POT BEFORE BED AND CONSIDER USING IT FIRST THING IN THE MORNING.  AVOID REFLUX TRIGGERS. SEE INFO BELOW.  CONTINUE PROTONIX. TAKE 30 MINUTES PRIOR TO BREAKFAST.  FOLLOW UP IN 4 MOS.    Lifestyle and home remedies TO CONTROL HEARTBURN  You may eliminate or reduce the frequency of heartburn by making the following lifestyle changes:  . Control your weight. Being overweight is a major risk factor for heartburn and GERD. Excess pounds put pressure on your abdomen, pushing up your stomach and causing acid to back up into your esophagus.   . Eat smaller meals. 4 TO 6 MEALS A DAY. This reduces pressure on the lower esophageal sphincter, helping to prevent the valve from opening and acid from washing back into your esophagus.   Dolphus Jenny your belt. Clothes that fit tightly around your waist put pressure on your abdomen and the lower esophageal sphincter.   . Eliminate heartburn triggers. Everyone has specific triggers.Common triggers such as fatty or fried foods, spicy food, tomato sauce, carbonated beverages, alcohol, chocolate, mint, garlic, onion, caffeine and nicotine may make heartburn worse.   Marland Kitchen Avoid stooping or bending. Tying your shoes is OK. Bending over for longer periods to weed your garden isn't, especially soon after eating.   . Don't lie down after a meal. Wait at least three to four hours after eating before going to bed, and don't lie down right after eating.   Alternative medicine . Several home remedies exist for treating GERD, but they provide only temporary relief. They include drinking baking soda (sodium bicarbonate) added to water or drinking other fluids such as baking soda mixed with cream of tartar and water. . Although these liquids create temporary relief by neutralizing, washing away or buffering acids,  eventually they aggravate the situation by adding gas and fluid to your stomach, increasing pressure and causing more acid reflux. Further, adding more sodium to your diet may increase your blood pressure and add stress to your heart, and excessive bicarbonate ingestion can alter the acid-base balance in your body.

## 2015-04-11 ENCOUNTER — Telehealth: Payer: Self-pay | Admitting: Gastroenterology

## 2015-04-11 NOTE — Telephone Encounter (Signed)
Pt called asking if SF would call something in for nausea (Lortab) for her to [facility].

## 2015-04-12 NOTE — Telephone Encounter (Signed)
LMOM to call.

## 2015-04-12 NOTE — Telephone Encounter (Signed)
Pt said her nausea is getting increasingly worse. Zofran has helped in the past. Sending to refill box.

## 2015-04-13 MED ORDER — ONDANSETRON HCL 8 MG PO TABS: 8.0000 mg | ORAL_TABLET | Freq: Three times a day (TID) | ORAL | Status: DC | PRN

## 2015-04-13 NOTE — Telephone Encounter (Signed)
Pt is aware.  

## 2015-04-13 NOTE — Addendum Note (Signed)
Addended by: Mahala Menghini on: 04/13/2015 09:48 AM   Modules accepted: Orders

## 2015-04-13 NOTE — Telephone Encounter (Signed)
RX done.  Continue with recommendations as per [facility] at time of OV.

## 2015-05-18 ENCOUNTER — Telehealth: Payer: Self-pay | Admitting: Gastroenterology

## 2015-05-18 ENCOUNTER — Other Ambulatory Visit: Payer: Self-pay

## 2015-05-18 DIAGNOSIS — C189 Malignant neoplasm of colon, unspecified: Secondary | ICD-10-CM

## 2015-05-18 MED ORDER — CIPROFLOXACIN HCL 500 MG PO TABS: 500.0000 mg | ORAL_TABLET | Freq: Two times a day (BID) | ORAL | Status: DC

## 2015-05-18 MED ORDER — IBUPROFEN 600 MG PO TABS: 600.0000 mg | ORAL_TABLET | Freq: Three times a day (TID) | ORAL | Status: DC

## 2015-05-18 MED ORDER — METRONIDAZOLE 500 MG PO TABS: 500.0000 mg | ORAL_TABLET | Freq: Three times a day (TID) | ORAL | Status: DC

## 2015-05-18 NOTE — Addendum Note (Signed)
Addended by: Danie Binder on: 05/18/2015 01:51 PM   Modules accepted: Orders

## 2015-05-18 NOTE — Telephone Encounter (Addendum)
PLEASE CALL PT. SHE NEEDS CIPRO/FLAGYL FOR HER ABDOMINAL PAIN. RX FOR IBUPROFEN SENT AS WELL. SHE SHOULD TAKE ALL OF THE ABX AND SHOULD NOT HAVE ANY LEFT OVER.   I DON'T WANT TO ORDER ANOTHER CT BECAUSE SHE HAD 4 IN 2016 AND THE RADIATION EXPOSURE IS SIGNIFICANT. REPEATED COURSES OF ABX ARE GOING TO INCREASE HER RISK OF GETTING C DIFF WHICH CAN RESULT IN DEATH.   SHE NEEDS TO HAVE HER SIGMOID COLON REMOVED. IF SHE CONTINUES TO HAVE EPISODES OF DIVERTICULITIS IT WILL ONLY MAKE HER SURGERY MORE RISKY AND INCREASE THE RISK OF COMPLICATIONS. SHE SHOULD SEE SURGERY TO DISCUSS BUT THEY WILL WANT THIS EPISODE TO RESOLVE BEFORE DOING SURGERY. I AM GOING TO MAKE A REFERRAL TO [facility].

## 2015-05-18 NOTE — Telephone Encounter (Signed)
REFERRAL HAS BEEN MADE  

## 2015-05-18 NOTE — Telephone Encounter (Signed)
Pt said she is having pain in her lower left abdomen like she has when she has a diverticulitis flare. She is at work and requesting Rx for Ibuprofen 800 mg to take. Said she has a couple pills of the last antibiotics that were given to her. Please advise!

## 2015-05-18 NOTE — Telephone Encounter (Signed)
PATIENT HAVING LOWER ABD PAIN AND WONDERING IF [facility] COULD CALL HER IN 800MG  IBUPROFIN    [phone]

## 2015-05-18 NOTE — Telephone Encounter (Signed)
Pt is aware and will take the medicine. OK for the referral to [facility].

## 2015-06-29 ENCOUNTER — Ambulatory Visit (INDEPENDENT_AMBULATORY_CARE_PROVIDER_SITE_OTHER): Payer: PRIVATE HEALTH INSURANCE | Admitting: Otolaryngology

## 2015-06-29 DIAGNOSIS — R0982 Postnasal drip: Secondary | ICD-10-CM | POA: Diagnosis not present

## 2015-06-29 DIAGNOSIS — J310 Chronic rhinitis: Secondary | ICD-10-CM

## 2015-07-06 ENCOUNTER — Encounter: Payer: Self-pay | Admitting: Gastroenterology

## 2015-07-26 ENCOUNTER — Telehealth: Payer: Self-pay | Admitting: Gastroenterology

## 2015-07-26 NOTE — Telephone Encounter (Signed)
I have re-faxed her information

## 2015-07-26 NOTE — Telephone Encounter (Signed)
Marilyn Rivera from [facility] called to say that we had referred patient to them and they do not have her records. Fax # [phone]

## 2015-08-02 ENCOUNTER — Encounter (HOSPITAL_COMMUNITY): Payer: Self-pay | Admitting: Emergency Medicine

## 2015-08-02 ENCOUNTER — Emergency Department (HOSPITAL_COMMUNITY)
Admission: EM | Admit: 2015-08-02 | Discharge: 2015-08-02 | Disposition: A | Discharge status: Home / Self Care | Payer: PRIVATE HEALTH INSURANCE | Attending: Emergency Medicine | Admitting: Emergency Medicine

## 2015-08-02 DIAGNOSIS — M5440 Lumbago with sciatica, unspecified side: Secondary | ICD-10-CM | POA: Insufficient documentation

## 2015-08-02 DIAGNOSIS — R319 Hematuria, unspecified: Secondary | ICD-10-CM | POA: Insufficient documentation

## 2015-08-02 DIAGNOSIS — M545 Low back pain: Secondary | ICD-10-CM

## 2015-08-02 LAB — URINALYSIS, ROUTINE W REFLEX MICROSCOPIC
Bilirubin Urine: NEGATIVE
Glucose, UA: NEGATIVE mg/dL
Ketones, ur: NEGATIVE mg/dL
NITRITE: NEGATIVE
Protein, ur: NEGATIVE mg/dL
SPECIFIC GRAVITY, URINE: 1.02 (ref 1.005–1.030)
pH: 6 (ref 5.0–8.0)

## 2015-08-02 LAB — URINE MICROSCOPIC-ADD ON

## 2015-08-02 LAB — PREGNANCY, URINE: PREG TEST UR: NEGATIVE

## 2015-08-02 MED ORDER — METHOCARBAMOL 500 MG PO TABS: 500.0000 mg | ORAL_TABLET | Freq: Two times a day (BID) | ORAL | Status: DC | PRN

## 2015-08-02 MED ORDER — KETOROLAC TROMETHAMINE 60 MG/2ML IM SOLN
30.0000 mg | Freq: Once | INTRAMUSCULAR | Status: AC
  Administered 2015-08-02: 30 mg via INTRAMUSCULAR
  Filled 2015-08-02: qty 2

## 2015-08-02 MED ORDER — DIAZEPAM 5 MG PO TABS
5.0000 mg | ORAL_TABLET | Freq: Once | ORAL | Status: AC
  Administered 2015-08-02: 5 mg via ORAL
  Filled 2015-08-02: qty 1

## 2015-08-02 MED ORDER — NAPROXEN 500 MG PO TABS: 500.0000 mg | ORAL_TABLET | Freq: Two times a day (BID) | ORAL | Status: DC

## 2015-08-02 NOTE — ED Notes (Signed)
Patient states pain started yesterday. Patient has been exercising however denies trauma to back. Patient having difficullity sitting.

## 2015-08-02 NOTE — ED Provider Notes (Signed)
CSN: [id number]     Arrival date & time 08/02/15  1258 History   First MD Initiated Contact with Patient 08/02/15 1342     Chief Complaint  Patient presents with  . Back Pain    Marilyn Rivera is a 35 y.o. female who presents to the ED Complaining of bilateral low back pain ongoing since yesterday. She claims a 10 out of 10 pain that is worse with movement. She reports her pain is in her bilateral low back and can radiate down both of her legs. Patient has a history kidney stones but reports this pain is very different. She denies any injury or trauma to her back. She does report she's had some urinary frequency and possibly some blood in her urine today. No dysuria or urinary urgency. Last menstrual cycle was one week ago and was normal. No vaginal bleeding or vaginal discharge. The patient denies fevers, trauma, numbness, tingling, weakness, loss of bladder control, loss of bowel control, history of cancer, history of IV drug use, dysuria, abdominal pain, nausea, vomiting or rashes.  Patient is a 36 y.o. female presenting with back pain. The history is provided by the patient. No language interpreter was used.  Back Pain Associated symptoms: no abdominal pain, no chest pain, no dysuria, no fever, no headaches, no numbness and no weakness     Past Medical History  Diagnosis Date  . Kidney stones   . GERD (gastroesophageal reflux disease)   . Diverticulosis   . Diverticulitis large intestine w/o perforation or abscess w/o bleeding 10/05/2014  . Helicobacter pylori gastritis 09/06/2014   Past Surgical History  Procedure Laterality Date  . Knee surgery Right   . Renal calculi removal Left 2010  . Skin lesion excision      over right eyebrow due to wax being left above eye and it seeped down into pore  . Colonoscopy N/A 06/10/2014    Dr. Oneida Alar; redundant sigmoid colon, moderate diverticulosis in the sigmoid and descending colon. small internal hemorrhoids. Next screening at age 62.   Marland Kitchen  Esophagogastroduodenoscopy N/A 06/10/2014    Dr. Oneida Alar: H.pylori gastritis s/p treatment with Amoxicillin and Biaxin   Family History  Problem Relation Age of Onset  . Heart failure Mother   . Cancer Other     mother's side of family, breast cancer  . Cancer Other     father's side of family, leukemia  . Hypertension Father   . Diabetes Other   . Hypertension Other   . Colon cancer Neg Hx    Social History  Substance Use Topics  . Smoking status: Never Smoker   . Smokeless tobacco: Never Used  . Alcohol Use: No   OB History    No data available     Review of Systems  Constitutional: Negative for fever and chills.  Eyes: Negative for visual disturbance.  Respiratory: Negative for cough and shortness of breath.   Cardiovascular: Negative for chest pain and leg swelling.  Gastrointestinal: Negative for nausea, vomiting, abdominal pain and diarrhea.  Genitourinary: Positive for frequency and hematuria. Negative for dysuria, urgency, flank pain, vaginal bleeding and difficulty urinating.  Musculoskeletal: Positive for back pain. Negative for neck pain.  Skin: Negative for rash.  Neurological: Negative for weakness, numbness and headaches.      Allergies  Review of patient's allergies indicates no known allergies.  Home Medications   Prior to Admission medications   Medication Sig Start Date End Date Taking? Authorizing Provider  methocarbamol (ROBAXIN) 500  MG tablet Take 1 tablet (500 mg total) by mouth 2 (two) times daily as needed for muscle spasms. 08/02/15   Waynetta Pean, PA-C  naproxen (NAPROSYN) 500 MG tablet Take 1 tablet (500 mg total) by mouth 2 (two) times daily with a meal. 08/02/15   Waynetta Pean, PA-C  ondansetron (ZOFRAN) 8 MG tablet Take 1 tablet (8 mg total) by mouth every 8 (eight) hours as needed for nausea or vomiting. 04/13/15   Mahala Menghini, PA-C  pantoprazole (PROTONIX) 40 MG tablet 1 PO 30 MINUTES PRIOR TO MEALS QD 04/05/15   Danie Binder, MD   BP  173/84 mmHg  Pulse 66  Temp(Src) 98.5 F (36.9 C) (Oral)  Resp 22  SpO2 98%  LMP 07/26/2015 (Exact Date) Physical Exam  Constitutional: She appears well-developed and well-nourished. No distress.  Nontoxic appearing. Obese female.  HENT:  Head: Normocephalic and atraumatic.  Mouth/Throat: Oropharynx is clear and moist.  Eyes: Conjunctivae are normal. Pupils are equal, round, and reactive to light. Right eye exhibits no discharge. Left eye exhibits no discharge.  Neck: Neck supple.  Cardiovascular: Normal rate, regular rhythm, normal heart sounds and intact distal pulses.   Pulmonary/Chest: Effort normal and breath sounds normal. No respiratory distress. She has no wheezes. She has no rales.  Abdominal: Soft. She exhibits no distension. There is no tenderness. There is no guarding.  Abdomen is soft and nontender to palpation.  Musculoskeletal: Normal range of motion. She exhibits tenderness. She exhibits no edema.  Patient's lumbar spine is tender to palpation diffusely across her low back. Palpation reproduces her pain. No lower extremity edema or tenderness. She has good strength to her bilateral lower extremities. No back deformity, edema, ecchymosis or warmth.  Lymphadenopathy:    She has no cervical adenopathy.  Neurological: She is alert. She has normal reflexes. She displays normal reflexes. Coordination normal.  Sensation is intact her bilateral lower extremities. She is able to ambulate with antalgic gait.  Skin: Skin is warm and dry. No rash noted. She is not diaphoretic. No erythema. No pallor.  Psychiatric: She has a normal mood and affect. Her behavior is normal.  Nursing note and vitals reviewed.   ED Course  Procedures (including critical care time) Labs Review Labs Reviewed  URINALYSIS, ROUTINE W REFLEX MICROSCOPIC (NOT AT [facility]) - Abnormal; Notable for the following:    Hgb urine dipstick LARGE (*)    Leukocytes, UA SMALL (*)    All other components within normal  limits  URINE MICROSCOPIC-ADD ON - Abnormal; Notable for the following:    Squamous Epithelial / LPF 6-30 (*)    Bacteria, UA FEW (*)    All other components within normal limits  URINE CULTURE  PREGNANCY, URINE    Imaging Review No results found. I have personally reviewed and evaluated these lab results as part of my medical decision-making.   EKG Interpretation None      Filed Vitals:   08/02/15 1310  BP: 173/84  Pulse: 66  Temp: 98.5 F (36.9 C)  TempSrc: Oral  Resp: 22  SpO2: 98%     MDM   Meds given in ED:  Medications  ketorolac (TORADOL) injection 30 mg (30 mg Intramuscular Given 08/02/15 1425)  diazepam (VALIUM) tablet 5 mg (5 mg Oral Given 08/02/15 1425)    New Prescriptions   METHOCARBAMOL (ROBAXIN) 500 MG TABLET    Take 1 tablet (500 mg total) by mouth 2 (two) times daily as needed for muscle spasms.  NAPROXEN (NAPROSYN) 500 MG TABLET    Take 1 tablet (500 mg total) by mouth 2 (two) times daily with a meal.    Final diagnoses:  Bilateral low back pain, with sciatica presence unspecified    This  is a 35 y.o. female who presents to the ED Complaining of bilateral low back pain ongoing since yesterday. She claims a 10 out of 10 pain that is worse with movement. She reports her pain is in her bilateral low back and can radiate down both of her legs. Patient has a history kidney stones but reports this pain is very different. She denies any injury or trauma to her back. She does report she's had some urinary frequency and possibly some blood in her urine today. No dysuria or urinary urgency. Last menstrual cycle was one week ago and was normal. No vaginal bleeding or vaginal discharge. The patient denies fevers, trauma, numbness, tingling, weakness. No back pain red flags.  On exam the patient is afebrile nontoxic appearing. She has tenderness diffusely to her lumbar spine. Her back musculature feels to be in spasm. No back edema, deformity, ecchymosis or warmth. No  lower extremity edema or tenderness. She has no focal neurological deficits. She is able and bili with antalgic gait. Patient is provided with Toradol and Valium. Pregnancy test is negative. Urinalysis is yellow in color with large hemoglobin. There is small leukocytes. It is nitrite negative. Few bacteria. Will send urine for culture. Low suspicion for urinary tract infection. I also doubt renal stone as the patient has pain with palpation and movement. She also reports this pain feels very different from her previous kidney stones. We'll discharge with prescriptions for Robaxin and naproxen and have her follow up with primary care. I advised the patient to follow-up with their primary care provider this week. I advised the patient to return to the emergency department with new or worsening symptoms or new concerns. The patient verbalized understanding and agreement with plan.    This patient was discussed with Dr. Gilford Raid who agrees with assessment and plan.   Waynetta Pean, PA-C 08/02/15 1448  Isla Pence, MD 08/02/15 [id number]

## 2015-08-02 NOTE — Discharge Instructions (Signed)
Back Exercises °The following exercises strengthen the muscles that help to support the back. They also help to keep the lower back flexible. Doing these exercises can help to prevent back pain or lessen existing pain. °If you have back pain or discomfort, try doing these exercises 2-3 times each day or as told by your health care provider. When the pain goes away, do them once each day, but increase the number of times that you repeat the steps for each exercise (do more repetitions). If you do not have back pain or discomfort, do these exercises once each day or as told by your health care provider. °EXERCISES °Single Knee to Chest °Repeat these steps 3-5 times for each leg: °· Lie on your back on a firm bed or the floor with your legs extended. °· Bring one knee to your chest. Your other leg should stay extended and in contact with the floor. °· Hold your knee in place by grabbing your knee or thigh. °· Pull on your knee until you feel a gentle stretch in your lower back. °· Hold the stretch for 10-30 seconds. °· Slowly release and straighten your leg. °Pelvic Tilt °Repeat these steps 5-10 times: °· Lie on your back on a firm bed or the floor with your legs extended. °· Bend your knees so they are pointing toward the ceiling and your feet are flat on the floor. °· Tighten your lower abdominal muscles to press your lower back against the floor. This motion will tilt your pelvis so your tailbone points up toward the ceiling instead of pointing to your feet or the floor. °· With gentle tension and even breathing, hold this position for 5-10 seconds. °Cat-Cow °Repeat these steps until your lower back becomes more flexible: °· Get into a hands-and-knees position on a firm surface. Keep your hands under your shoulders, and keep your knees under your hips. You may place padding under your knees for comfort. °· Let your head hang down, and point your tailbone toward the floor so your lower back becomes rounded like the  back of a cat. °· Hold this position for 5 seconds. °· Slowly lift your head and point your tailbone up toward the ceiling so your back forms a sagging arch like the back of a cow. °· Hold this position for 5 seconds. °Press-Ups °Repeat these steps 5-10 times: °· Lie on your abdomen (face-down) on the floor. °· Place your palms near your head, about shoulder-width apart. °· While you keep your back as relaxed as possible and keep your hips on the floor, slowly straighten your arms to raise the top half of your body and lift your shoulders. Do not use your back muscles to raise your upper torso. You may adjust the placement of your hands to make yourself more comfortable. °· Hold this position for 5 seconds while you keep your back relaxed. °· Slowly return to lying flat on the floor. °Bridges °Repeat these steps 10 times: °1. Lie on your back on a firm surface. °2. Bend your knees so they are pointing toward the ceiling and your feet are flat on the floor. °3. Tighten your buttocks muscles and lift your buttocks off of the floor until your waist is at almost the same height as your knees. You should feel the muscles working in your buttocks and the back of your thighs. If you do not feel these muscles, slide your feet 1-2 inches farther away from your buttocks. °4. Hold this position for 3-5   seconds. °5. Slowly lower your hips to the starting position, and allow your buttocks muscles to relax completely. °If this exercise is too easy, try doing it with your arms crossed over your chest. °Abdominal Crunches °Repeat these steps 5-10 times: °1. Lie on your back on a firm bed or the floor with your legs extended. °2. Bend your knees so they are pointing toward the ceiling and your feet are flat on the floor. °3. Cross your arms over your chest. °4. Tip your chin slightly toward your chest without bending your neck. °5. Tighten your abdominal muscles and slowly raise your trunk (torso) high enough to lift your shoulder  blades a tiny bit off of the floor. Avoid raising your torso higher than that, because it can put too much stress on your low back and it does not help to strengthen your abdominal muscles. °6. Slowly return to your starting position. °Back Lifts °Repeat these steps 5-10 times: °1. Lie on your abdomen (face-down) with your arms at your sides, and rest your forehead on the floor. °2. Tighten the muscles in your legs and your buttocks. °3. Slowly lift your chest off of the floor while you keep your hips pressed to the floor. Keep the back of your head in line with the curve in your back. Your eyes should be looking at the floor. °4. Hold this position for 3-5 seconds. °5. Slowly return to your starting position. °SEEK MEDICAL CARE IF: °· Your back pain or discomfort gets much worse when you do an exercise. °· Your back pain or discomfort does not lessen within 2 hours after you exercise. °If you have any of these problems, stop doing these exercises right away. Do not do them again unless your health care provider says that you can. °SEEK IMMEDIATE MEDICAL CARE IF: °· You develop sudden, severe back pain. If this happens, stop doing the exercises right away. Do not do them again unless your health care provider says that you can. °  °This information is not intended to replace advice given to you by your health care provider. Make sure you discuss any questions you have with your health care provider. °  °Document Released: 03/21/2004 Document Revised: 11/02/2014 Document Reviewed: 04/07/2014 °Elsevier Interactive Patient Education ©2016 [employer] ° °Back Pain, Adult °Back pain is very common in adults. The cause of back pain is rarely dangerous and the pain often gets better over time. The cause of your back pain may not be known. Some common causes of back pain include: °· Strain of the muscles or ligaments supporting the spine. °· Wear and tear (degeneration) of the spinal disks. °· Arthritis. °· Direct injury  to the back. °For many people, back pain may return. Since back pain is rarely dangerous, most people can learn to manage this condition on their own. °HOME CARE INSTRUCTIONS °Watch your back pain for any changes. The following actions may help to lessen any discomfort you are feeling: °· Remain active. It is stressful on your back to sit or stand in one place for long periods of time. Do not sit, drive, or stand in one place for more than 30 minutes at a time. Take short walks on even surfaces as soon as you are able. Try to increase the length of time you walk each day. °· Exercise regularly as directed by your health care provider. Exercise helps your back heal faster. It also helps avoid future injury by keeping your muscles strong and flexible. °· Do not stay in   bed. Resting more than 1-2 days can delay your recovery. °· Pay attention to your body when you bend and lift. The most comfortable positions are those that put less stress on your recovering back. Always use proper lifting techniques, including: °¨ Bending your knees. °¨ Keeping the load close to your body. °¨ Avoiding twisting. °· Find a comfortable position to sleep. Use a firm mattress and lie on your side with your knees slightly bent. If you lie on your back, put a pillow under your knees. °· Avoid feeling anxious or stressed. Stress increases muscle tension and can worsen back pain. It is important to recognize when you are anxious or stressed and learn ways to manage it, such as with exercise. °· Take medicines only as directed by your health care provider. Over-the-counter medicines to reduce pain and inflammation are often the most helpful. Your health care provider may prescribe muscle relaxant drugs. These medicines help dull your pain so you can more quickly return to your normal activities and healthy exercise. °· Apply ice to the injured area: °¨ Put ice in a plastic bag. °¨ Place a towel between your skin and the bag. °¨ Leave the ice on  for 20 minutes, 2-3 times a day for the first 2-3 days. After that, ice and heat may be alternated to reduce pain and spasms. °· Maintain a healthy weight. Excess weight puts extra stress on your back and makes it difficult to maintain good posture. °SEEK MEDICAL CARE IF: °· You have pain that is not relieved with rest or medicine. °· You have increasing pain going down into the legs or buttocks. °· You have pain that does not improve in one week. °· You have night pain. °· You lose weight. °· You have a fever or chills. °SEEK IMMEDIATE MEDICAL CARE IF:  °· You develop new bowel or bladder control problems. °· You have unusual weakness or numbness in your arms or legs. °· You develop nausea or vomiting. °· You develop abdominal pain. °· You feel faint. °  °This information is not intended to replace advice given to you by your health care provider. Make sure you discuss any questions you have with your health care provider. °  °Document Released: 02/11/2005 Document Revised: 03/04/2014 Document Reviewed: 06/15/2013 °Elsevier Interactive Patient Education ©2016 [employer] ° °

## 2015-08-04 LAB — URINE CULTURE

## 2015-10-11 ENCOUNTER — Other Ambulatory Visit: Payer: Self-pay | Admitting: Gastroenterology

## 2015-10-25 ENCOUNTER — Emergency Department (HOSPITAL_COMMUNITY)
Admission: EM | Admit: 2015-10-25 | Discharge: 2015-10-25 | Disposition: A | Discharge status: Home / Self Care | Payer: Self-pay | Attending: Emergency Medicine | Admitting: Emergency Medicine

## 2015-10-25 ENCOUNTER — Telehealth: Payer: Self-pay

## 2015-10-25 ENCOUNTER — Encounter (HOSPITAL_COMMUNITY): Payer: Self-pay

## 2015-10-25 DIAGNOSIS — Z79899 Other long term (current) drug therapy: Secondary | ICD-10-CM | POA: Insufficient documentation

## 2015-10-25 DIAGNOSIS — R1032 Left lower quadrant pain: Secondary | ICD-10-CM | POA: Insufficient documentation

## 2015-10-25 LAB — URINE MICROSCOPIC-ADD ON: RBC / HPF: NONE SEEN RBC/hpf (ref 0–5)

## 2015-10-25 LAB — URINALYSIS, ROUTINE W REFLEX MICROSCOPIC
Bilirubin Urine: NEGATIVE
Glucose, UA: NEGATIVE mg/dL
Leukocytes, UA: NEGATIVE
Nitrite: NEGATIVE
Protein, ur: 30 mg/dL — AB
Specific Gravity, Urine: >1.03 — ABNORMAL HIGH (ref 1.005–1.030)
pH: 6 (ref 5.0–8.0)

## 2015-10-25 LAB — COMPREHENSIVE METABOLIC PANEL
ALT: 15 U/L (ref 14–54)
AST: 14 U/L — ABNORMAL LOW (ref 15–41)
Albumin: 3.5 g/dL (ref 3.5–5.0)
Alkaline Phosphatase: 58 U/L (ref 38–126)
Anion gap: 6 (ref 5–15)
BUN: 12 mg/dL (ref 6–20)
CO2: 27 mmol/L (ref 22–32)
Calcium: 8.8 mg/dL — ABNORMAL LOW (ref 8.9–10.3)
Chloride: 106 mmol/L (ref 101–111)
Creatinine, Ser: 0.89 mg/dL (ref 0.44–1.00)
GFR calc Af Amer: >60 mL/min (ref >60)
GFR calc non Af Amer: >60 mL/min (ref >60)
Glucose, Bld: 98 mg/dL (ref 65–99)
Potassium: 3.4 mmol/L — ABNORMAL LOW (ref 3.5–5.1)
Sodium: 139 mmol/L (ref 135–145)
Total Bilirubin: 0.7 mg/dL (ref 0.3–1.2)
Total Protein: 7 g/dL (ref 6.5–8.1)

## 2015-10-25 LAB — CBC WITH DIFFERENTIAL/PLATELET
Basophils Absolute: 0 10*3/uL (ref 0.0–0.1)
Basophils Relative: 0 %
Eosinophils Absolute: 0.1 10*3/uL (ref 0.0–0.7)
Eosinophils Relative: 1 %
HCT: 40.9 % (ref 36.0–46.0)
Hemoglobin: 13.5 g/dL (ref 12.0–15.0)
Lymphocytes Relative: 34 %
Lymphs Abs: 3.3 10*3/uL (ref 0.7–4.0)
MCH: 29.5 pg (ref 26.0–34.0)
MCHC: 33 g/dL (ref 30.0–36.0)
MCV: 89.5 fL (ref 78.0–100.0)
Monocytes Absolute: 0.8 10*3/uL (ref 0.1–1.0)
Monocytes Relative: 9 %
Neutro Abs: 5.3 10*3/uL (ref 1.7–7.7)
Neutrophils Relative %: 56 %
Platelets: 169 10*3/uL (ref 150–400)
RBC: 4.57 MIL/uL (ref 3.87–5.11)
RDW: 15.1 % (ref 11.5–15.5)
WBC: 9.6 10*3/uL (ref 4.0–10.5)

## 2015-10-25 LAB — PREGNANCY, URINE: Preg Test, Ur: NEGATIVE

## 2015-10-25 MED ORDER — KETOROLAC TROMETHAMINE 30 MG/ML IJ SOLN
30.0000 mg | Freq: Once | INTRAMUSCULAR | Status: AC
  Administered 2015-10-25: 30 mg via INTRAVENOUS

## 2015-10-25 MED ORDER — KETOROLAC TROMETHAMINE 30 MG/ML IJ SOLN
30.0000 mg | Freq: Once | INTRAMUSCULAR | Status: DC
  Filled 2015-10-25: qty 1

## 2015-10-25 MED ORDER — CIPROFLOXACIN HCL 250 MG PO TABS
500.0000 mg | ORAL_TABLET | Freq: Once | ORAL | Status: AC
  Administered 2015-10-25: 500 mg via ORAL
  Filled 2015-10-25: qty 2

## 2015-10-25 MED ORDER — ONDANSETRON 4 MG PO TBDP
4.0000 mg | ORAL_TABLET | Freq: Once | ORAL | Status: AC
  Administered 2015-10-25: 4 mg via ORAL
  Filled 2015-10-25: qty 1

## 2015-10-25 MED ORDER — METRONIDAZOLE 500 MG PO TABS
500.0000 mg | ORAL_TABLET | Freq: Three times a day (TID) | ORAL | 0 refills | Status: DC
Start: 2015-10-25 — End: 2016-04-19

## 2015-10-25 MED ORDER — HYDROMORPHONE HCL 1 MG/ML IJ SOLN
1.0000 mg | Freq: Once | INTRAMUSCULAR | Status: DC
  Filled 2015-10-25: qty 1

## 2015-10-25 MED ORDER — OXYCODONE-ACETAMINOPHEN 5-325 MG PO TABS
1.0000 | ORAL_TABLET | ORAL | 0 refills | Status: DC | PRN
Start: 2015-10-25 — End: 2016-04-19

## 2015-10-25 MED ORDER — ONDANSETRON HCL 4 MG/2ML IJ SOLN
4.0000 mg | Freq: Once | INTRAMUSCULAR | Status: AC
  Administered 2015-10-25: 4 mg via INTRAVENOUS
  Filled 2015-10-25: qty 2

## 2015-10-25 MED ORDER — SODIUM CHLORIDE 0.9 % IV BOLUS (SEPSIS)
1000.0000 mL | Freq: Once | INTRAVENOUS | Status: AC
  Administered 2015-10-25: 1000 mL via INTRAVENOUS

## 2015-10-25 MED ORDER — METRONIDAZOLE 500 MG PO TABS
500.0000 mg | ORAL_TABLET | Freq: Once | ORAL | Status: AC
  Administered 2015-10-25: 500 mg via ORAL
  Filled 2015-10-25: qty 1

## 2015-10-25 MED ORDER — HYDROMORPHONE HCL 1 MG/ML IJ SOLN
1.0000 mg | Freq: Once | INTRAMUSCULAR | Status: AC
  Administered 2015-10-25: 1 mg via INTRAVENOUS
  Filled 2015-10-25: qty 1

## 2015-10-25 MED ORDER — CIPROFLOXACIN HCL 500 MG PO TABS: 500.0000 mg | ORAL_TABLET | Freq: Two times a day (BID) | ORAL | 0 refills | Status: DC

## 2015-10-25 MED ORDER — LORAZEPAM 2 MG/ML IJ SOLN
0.5000 mg | Freq: Once | INTRAMUSCULAR | Status: AC
  Administered 2015-10-25: 0.5 mg via INTRAVENOUS
  Filled 2015-10-25: qty 1

## 2015-10-25 MED ORDER — HYDROMORPHONE HCL 1 MG/ML IJ SOLN
1.0000 mg | Freq: Once | INTRAMUSCULAR | Status: AC
  Administered 2015-10-25: 1 mg via INTRAVENOUS

## 2015-10-25 NOTE — ED Triage Notes (Signed)
Pt reports LLQ pain since Sunday with nausea.  No vomiting.  Reports lbm was yesterday but was small.  Denies any black or bloody stool.  Pt also says it hurts to void.  Denies any abnormal vaginal bleeding or discharge.  Reports history of diverticulitis.

## 2015-10-25 NOTE — Telephone Encounter (Signed)
noted 

## 2015-10-25 NOTE — Telephone Encounter (Signed)
Pt called and said she had been having severe LLQ  abdominal pain since Sunday AM. It subsides sometimes but she said it had been rated at a 10 earlier this morning until she took some Ibuprofen, which helped a little.   She has had nausea and feels like she needs to vomit, and would feel better if she could.  I told her she should go to the ED since she has a hx of Diverticulitis. She said she would.   Forwarding to Neil Crouch, PA to [place]. They are at a mandatory meeting now.

## 2015-11-02 ENCOUNTER — Emergency Department (HOSPITAL_COMMUNITY): Payer: Self-pay

## 2015-11-02 ENCOUNTER — Emergency Department (HOSPITAL_COMMUNITY)
Admission: EM | Admit: 2015-11-02 | Discharge: 2015-11-03 | Disposition: A | Discharge status: Home / Self Care | Payer: Self-pay | Attending: Emergency Medicine | Admitting: Emergency Medicine

## 2015-11-02 ENCOUNTER — Encounter (HOSPITAL_COMMUNITY): Payer: Self-pay

## 2015-11-02 DIAGNOSIS — S0990XA Unspecified injury of head, initial encounter: Secondary | ICD-10-CM

## 2015-11-02 DIAGNOSIS — M25562 Pain in left knee: Secondary | ICD-10-CM

## 2015-11-02 DIAGNOSIS — Y999 Unspecified external cause status: Secondary | ICD-10-CM | POA: Insufficient documentation

## 2015-11-02 DIAGNOSIS — Y92007 Garden or yard of unspecified non-institutional (private) residence as the place of occurrence of the external cause: Secondary | ICD-10-CM | POA: Insufficient documentation

## 2015-11-02 DIAGNOSIS — S0003XA Contusion of scalp, initial encounter: Secondary | ICD-10-CM | POA: Insufficient documentation

## 2015-11-02 DIAGNOSIS — M542 Cervicalgia: Secondary | ICD-10-CM | POA: Insufficient documentation

## 2015-11-02 DIAGNOSIS — Z791 Long term (current) use of non-steroidal anti-inflammatories (NSAID): Secondary | ICD-10-CM | POA: Insufficient documentation

## 2015-11-02 DIAGNOSIS — Y9389 Activity, other specified: Secondary | ICD-10-CM | POA: Insufficient documentation

## 2015-11-02 DIAGNOSIS — Z79899 Other long term (current) drug therapy: Secondary | ICD-10-CM | POA: Insufficient documentation

## 2015-11-02 LAB — POC URINE PREG, ED: Preg Test, Ur: NEGATIVE

## 2015-11-02 MED ORDER — HYDROCODONE-ACETAMINOPHEN 5-325 MG PO TABS
1.0000 | ORAL_TABLET | Freq: Once | ORAL | Status: AC
  Administered 2015-11-02: 1 via ORAL
  Filled 2015-11-02: qty 1

## 2015-11-02 MED ORDER — IBUPROFEN 600 MG PO TABS
600.0000 mg | ORAL_TABLET | Freq: Four times a day (QID) | ORAL | 0 refills | Status: DC | PRN
Start: 2015-11-02 — End: 2016-04-19

## 2015-11-02 MED ORDER — HYDROCODONE-ACETAMINOPHEN 5-325 MG PO TABS: 1.0000 | ORAL_TABLET | ORAL | 0 refills | Status: DC | PRN

## 2015-11-02 MED ORDER — ONDANSETRON 8 MG PO TBDP
8.0000 mg | ORAL_TABLET | Freq: Once | ORAL | Status: AC
  Administered 2015-11-02: 8 mg via ORAL
  Filled 2015-11-02: qty 1
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
## 2016-04-19 ENCOUNTER — Encounter (HOSPITAL_COMMUNITY): Payer: Self-pay | Admitting: Emergency Medicine

## 2016-04-19 ENCOUNTER — Emergency Department (HOSPITAL_COMMUNITY)
Admission: EM | Admit: 2016-04-19 | Discharge: 2016-04-19 | Disposition: A | Discharge status: Home / Self Care | Payer: Self-pay | Attending: Emergency Medicine | Admitting: Emergency Medicine

## 2016-04-19 ENCOUNTER — Emergency Department (HOSPITAL_COMMUNITY): Payer: Self-pay

## 2016-04-19 DIAGNOSIS — Z791 Long term (current) use of non-steroidal anti-inflammatories (NSAID): Secondary | ICD-10-CM | POA: Insufficient documentation

## 2016-04-19 DIAGNOSIS — K5732 Diverticulitis of large intestine without perforation or abscess without bleeding: Secondary | ICD-10-CM | POA: Insufficient documentation

## 2016-04-19 LAB — CBC
HCT: 40.9 % (ref 36.0–46.0)
Hemoglobin: 13.7 g/dL (ref 12.0–15.0)
MCH: 30.2 pg (ref 26.0–34.0)
MCHC: 33.5 g/dL (ref 30.0–36.0)
MCV: 90.1 fL (ref 78.0–100.0)
Platelets: 205 10*3/uL (ref 150–400)
RBC: 4.54 MIL/uL (ref 3.87–5.11)
RDW: 14.3 % (ref 11.5–15.5)
WBC: 8.2 10*3/uL (ref 4.0–10.5)

## 2016-04-19 LAB — COMPREHENSIVE METABOLIC PANEL
ALBUMIN: 3.3 g/dL — AB (ref 3.5–5.0)
ALT: 14 U/L (ref 14–54)
ANION GAP: 7 (ref 5–15)
AST: 15 U/L (ref 15–41)
Alkaline Phosphatase: 67 U/L (ref 38–126)
BUN: 14 mg/dL (ref 6–20)
CALCIUM: 8.7 mg/dL — AB (ref 8.9–10.3)
CHLORIDE: 106 mmol/L (ref 101–111)
CO2: 26 mmol/L (ref 22–32)
CREATININE: 0.86 mg/dL (ref 0.44–1.00)
GFR calc Af Amer: >60 mL/min (ref >60)
GFR calc non Af Amer: >60 mL/min (ref >60)
Glucose, Bld: 107 mg/dL — ABNORMAL HIGH (ref 65–99)
POTASSIUM: 3.7 mmol/L (ref 3.5–5.1)
Sodium: 139 mmol/L (ref 135–145)
TOTAL PROTEIN: 6.8 g/dL (ref 6.5–8.1)
Total Bilirubin: 0.4 mg/dL (ref 0.3–1.2)

## 2016-04-19 LAB — I-STAT CREATININE, ED: CREATININE: 0.9 mg/dL (ref 0.44–1.00)

## 2016-04-19 LAB — LIPASE, BLOOD: Lipase: 13 U/L (ref 11–51)

## 2016-04-19 LAB — I-STAT BETA HCG BLOOD, ED (MC, WL, AP ONLY)

## 2016-04-19 MED ORDER — METRONIDAZOLE 500 MG PO TABS
500.0000 mg | ORAL_TABLET | Freq: Once | ORAL | Status: AC
  Administered 2016-04-19: 500 mg via ORAL
  Filled 2016-04-19: qty 1

## 2016-04-19 MED ORDER — CIPROFLOXACIN HCL 500 MG PO TABS: 500.0000 mg | ORAL_TABLET | Freq: Two times a day (BID) | ORAL | 0 refills | Status: DC

## 2016-04-19 MED ORDER — ONDANSETRON HCL 4 MG/2ML IJ SOLN
4.0000 mg | Freq: Once | INTRAMUSCULAR | Status: AC
Start: 2016-04-19 — End: 2016-04-19
  Administered 2016-04-19: 4 mg via INTRAVENOUS
  Filled 2016-04-19: qty 2

## 2016-04-19 MED ORDER — MORPHINE SULFATE (PF) 4 MG/ML IV SOLN
4.0000 mg | Freq: Once | INTRAVENOUS | Status: AC
  Administered 2016-04-19: 4 mg via INTRAVENOUS
  Filled 2016-04-19: qty 1

## 2016-04-19 MED ORDER — METRONIDAZOLE 500 MG PO TABS: 500.0000 mg | ORAL_TABLET | Freq: Two times a day (BID) | ORAL | 0 refills | Status: DC

## 2016-04-19 MED ORDER — HYDROMORPHONE HCL 1 MG/ML IJ SOLN
1.0000 mg | Freq: Once | INTRAMUSCULAR | Status: AC
  Administered 2016-04-19: 1 mg via INTRAVENOUS
  Filled 2016-04-19: qty 1

## 2016-04-19 MED ORDER — SODIUM CHLORIDE 0.9 % IV BOLUS (SEPSIS)
1000.0000 mL | Freq: Once | INTRAVENOUS | Status: AC
  Administered 2016-04-19: 1000 mL via INTRAVENOUS

## 2016-04-19 MED ORDER — SENNOSIDES-DOCUSATE SODIUM 8.6-50 MG PO TABS: 2.0000 | ORAL_TABLET | Freq: Every day | ORAL | 0 refills | Status: AC

## 2016-04-19 MED ORDER — IOPAMIDOL (ISOVUE-300) INJECTION 61%
INTRAVENOUS | Status: AC
  Administered 2016-04-19: 30 mL
  Filled 2016-04-19: qty 30

## 2016-04-19 MED ORDER — CIPROFLOXACIN HCL 250 MG PO TABS
500.0000 mg | ORAL_TABLET | Freq: Once | ORAL | Status: AC
  Administered 2016-04-19: 500 mg via ORAL
  Filled 2016-04-19: qty 2

## 2016-04-19 MED ORDER — HYDROCODONE-ACETAMINOPHEN 5-325 MG PO TABS: 1.0000 | ORAL_TABLET | Freq: Four times a day (QID) | ORAL | 0 refills | Status: DC | PRN

## 2016-04-19 MED ORDER — IOPAMIDOL (ISOVUE-300) INJECTION 61%
150.0000 mL | Freq: Once | INTRAVENOUS | Status: AC | PRN
  Administered 2016-04-19: 100 mL via INTRAVENOUS

## 2016-04-19 NOTE — ED Triage Notes (Signed)
Patient complaining of lower left quadrant abdominal pain x 1 week. States "it could be my diverticulitis or a UTI." Also complaining of burning with urination. Denies vomiting/diarrhea.
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
## 2016-04-19 NOTE — Discharge Instructions (Signed)
As discussed, it is important that you follow up as soon as possible with your physician for continued management of your condition. ° °If you develop any new, or concerning changes in your condition, please return to the emergency department immediately. ° °

## 2016-04-19 NOTE — ED Notes (Signed)
Back from XRAY

## 2016-04-24 ENCOUNTER — Ambulatory Visit (INDEPENDENT_AMBULATORY_CARE_PROVIDER_SITE_OTHER): Payer: Self-pay | Admitting: Gastroenterology

## 2016-04-24 ENCOUNTER — Other Ambulatory Visit: Payer: Self-pay

## 2016-04-24 ENCOUNTER — Encounter: Payer: Self-pay | Admitting: Gastroenterology

## 2016-04-24 DIAGNOSIS — K5732 Diverticulitis of large intestine without perforation or abscess without bleeding: Secondary | ICD-10-CM

## 2016-04-24 DIAGNOSIS — D649 Anemia, unspecified: Secondary | ICD-10-CM

## 2016-04-24 DIAGNOSIS — K219 Gastro-esophageal reflux disease without esophagitis: Secondary | ICD-10-CM | POA: Insufficient documentation

## 2016-04-24 MED ORDER — OMEPRAZOLE 20 MG PO CPDR: DELAYED_RELEASE_CAPSULE | ORAL | 3 refills | Status: DC

## 2016-04-24 MED ORDER — HYDROCODONE-ACETAMINOPHEN 5-325 MG PO TABS: 1.0000 | ORAL_TABLET | Freq: Four times a day (QID) | ORAL | 0 refills | Status: DC | PRN

## 2016-04-24 MED ORDER — ONDANSETRON HCL 4 MG PO TABS: ORAL_TABLET | ORAL | 1 refills | Status: DC

## 2016-04-24 NOTE — Progress Notes (Signed)
   Subjective:    Patient ID: Marilyn Rivera, female    DOB: 07/06/1980, 36 y.o.   MRN: [id number]  HPI DOCUMENTED DIVERTICULITIS > 3 TIMES SINCE MAR 2016. ALWAYS IN THE SIGMOID COLON. LAST TCS: APR 2016-DIVERTICULOSIS. SEEN IN ED FEB 2018: [place]. LAST OPV 2017. Taking ibuprofen qod.TRYING TO LOSE WEIGHT. GOT A PERSONAL TRAINER. SAW [facility] AND FELT NOT BAD ENOUGH FOR SURGERY. NOW WORKING FOR [employer]. STARTED LAST MON A LITTLE SHARP PAIN IN LEFT SIDE AND WENT ALL WEEK AND KEPT GETTING WORSE ON FRI IT STOPPED HER IN HER TRACKS. ON CIPRO AND FLAGYL SINCE FRI. FEELS BETTER THAN SHE DID. STILL HAS LOWER LEFT PAIN AND NAUSEA. NEEDS MEDS FOR NAUSEA (ZOFRAN). FELT CONSTIPATED. WHEN SHE HAS TO GO SHE CANNOT HOLD IT. NEEDS HEARTBURN PILLS-HELP  PT DENIES FEVER, CHILLS, HEMATOCHEZIA, HEMATEMESIS, vomiting, melena, diarrhea, CHEST PAIN, SHORTNESS OF BREATH, CHANGE IN BOWEL IN HABITS, problems swallowing, or heartburn or indigestion.  Past Medical History:  Diagnosis Date  . Diverticulitis large intestine w/o perforation or abscess w/o bleeding 10/05/2014  . Diverticulosis   . GERD (gastroesophageal reflux disease)   . Helicobacter pylori gastritis 09/06/2014  . Kidney stones    Past Surgical History:  Procedure Laterality Date  . COLONOSCOPY N/A 06/10/2014   Dr. Oneida Alar; redundant sigmoid colon, moderate diverticulosis in the sigmoid and descending colon. small internal hemorrhoids. Next screening at age 67.   Marland Kitchen ESOPHAGOGASTRODUODENOSCOPY N/A 06/10/2014   Dr. Oneida Alar: H.pylori gastritis s/p treatment with Amoxicillin and Biaxin  . KNEE SURGERY Right   . renal calculi removal Left 2010  . SKIN LESION EXCISION     over right eyebrow due to wax being left above eye and it seeped down into pore   No Known Allergies  Current Outpatient Prescriptions  Medication Sig Dispense Refill  . CIPRO 500 MG tablet Take 1 tablet (500 mg total) by mouth 2 (two) times daily.    . NORCO/VICODIN  5-325 MG tablet Take 1 tablet by mouth every 6 (six) hours as needed for severe pain.    Marland Kitchen ibuprofen (ADVIL,MOTRIN) 200 MG tablet Take 200-1,000 mg by mouth 2 (two) times daily as needed for mild pain or moderate pain.    . metroNIDAZOLE (FLAGYL) 500 MG tablet Take 1 tablet (500 mg total) by mouth 2 (two) times daily.    Marland Kitchen senna-docusate (SENOKOT-S) 8.6-50 MG tablet Take 2 tablets by mouth daily.     Review of Systems PER HPI OTHERWISE ALL SYSTEMS ARE NEGATIVE.    Objective:   Physical Exam  Constitutional: She is oriented to person, place, and time. She appears well-developed and well-nourished. No distress.  HENT:  Head: Normocephalic and atraumatic.  Mouth/Throat: Oropharynx is clear and moist. No oropharyngeal exudate.  Eyes: Pupils are equal, round, and reactive to light. No scleral icterus.  Neck: Normal range of motion. Neck supple.  Cardiovascular: Normal rate, regular rhythm and normal heart sounds.   Pulmonary/Chest: Effort normal and breath sounds normal. No respiratory distress.  Abdominal: Soft. Bowel sounds are normal. She exhibits no distension. There is tenderness. There is no rebound and no guarding.  MILD LLQs TTP  Musculoskeletal: She exhibits no edema.  Lymphadenopathy:    She has no cervical adenopathy.  Neurological: She is alert and oriented to person, place, and time.  NO FOCAL DEFICITS  Psychiatric: She has a normal mood and affect.  Vitals reviewed.     Assessment & Plan:

## 2016-04-24 NOTE — Patient Instructions (Signed)
DRINK WATER TO KEEP YOUR URINE LIGHT YELLOW.  CONTINUE YOUR WEIGHT LOSS EFFORTS.  SEE SURGEONS AT [facility] REGARDING REMOVING YOUR SIGMOID COLON.  COMPLETE CIPRO AND FLAGYL.  PLEASE CONTACT ME WITH QUESTIONS OR CONCERNS. SIGN UP FOR MY CHART AND SEND ME A MESSAGE.  CONTINUE OMEPRAZOLE.  TAKE 30 MINUTES PRIOR TO YOUR FIRST MEAL.  USE ZOFRAN IF NEEDED FOR NAUSEA.  USE IBUPROFEN 800 MG ONE TIME DAILY IF NEEDED TO CONTROL PAIN.  NORCO 1/2-1 TAB EVERY 6 HOURS IF NEEDED TO CONTROL PAIN. I CANNOT REFILL THE PAIN MEDS.  FOLLOW UP IN 6 MOS.

## 2016-04-24 NOTE — Assessment & Plan Note (Signed)
SYMPTOMS NOT CONTROLLED OFF MEDS.  AVOID TRIGGERS.  HANDOUT GIVEN. CONTINUE OMEPRAZOLE.  TAKE 30 MINUTES PRIOR TO YOUR FIRST MEAL. FOLLOW UP IN 6 MOS.

## 2016-04-24 NOTE — Progress Notes (Signed)
NO PCP PER PATIENT °

## 2016-04-24 NOTE — Progress Notes (Signed)
ON RECALL  °

## 2016-04-24 NOTE — Assessment & Plan Note (Signed)
>   3 EPISODE DOCUMENTED IN THE SIGMOID COLON SINCE 2016. LAST EXACERBATION FEB 2018. WORKING ON HER WEIGHT LOSS. WEIGHT DOWN 24 LBS SINCE 2017. SAW [facility] AND DECLINED TO OPERATE.  EXPLAINED TO PT THEY MAY HAVE BEEN HESITANT TO OPERATE DUE TO BMI > 50 AND IF SHE COULD LOSE WEIGHT IT REDUCE HER RISK FROM COMPLICATIONS OF SURGERY. COMPLETE CIPRO AND FLAGYL. PLEASE CONTACT ME WITH QUESTIONS OR CONCERNS. SIGN UP FOR MY CHART AND SEND ME A MESSAGE. USE ZOFRAN IF NEEDED FOR NAUSEA. USE IBUPROFEN 800 MG ONE TIME DAILY IF NEEDED TO CONTROL PAIN. NORCO 1/2-1 TAB EVERY 6 HOURS IF NEEDED TO CONTROL PAIN. I CANNOT REFILL THE PAIN MEDS.  FOLLOW UP IN 6 MOS.

## 2016-04-24 NOTE — Assessment & Plan Note (Signed)
>   3 EPISODE DOCUMENTED IN THE SIGMOID COLON SINCE 2016. LAST EXACERBATION FEB 2018. WORKING ON HER WEIGHT LOSS. WEIGHT DOWN 24 LBS SINCE 2017. SAW [facility] AND DECLINED TO OPERATE.  EXPLAINED TO PT THEY MAY HAVE BEEN HESITANT TO OPERATE DUE TO BMI > 50 AND IF SHE COULD LOSE WEIGHT IT REDUCE HER RISK FROM COMPLICATIONS OF SURGERY. CONTINUE TO MONITOR WEIGHT. DISCUSSED GASTRIC BYPASS SURGERY AS A WEIGHT LOSS OPTION.

## 2016-04-24 NOTE — Assessment & Plan Note (Signed)
FEB 2018-NL Hb.  CONTINUE TO MONITOR.Marland Kitchen

## 2016-05-13 ENCOUNTER — Telehealth: Payer: Self-pay

## 2016-05-13 NOTE — Telephone Encounter (Signed)
Called [facility] to follow-up on referral. Pt has appt 05/20/16 at 9:30 with general surgery.

## 2016-06-24 ENCOUNTER — Other Ambulatory Visit: Payer: Self-pay | Admitting: Gastroenterology

## 2016-06-25 NOTE — Telephone Encounter (Signed)
Tried to call pt, [facility] that our office received medication refill request and is unable to refill the medication for her.

## 2016-07-24 ENCOUNTER — Other Ambulatory Visit: Payer: Self-pay | Admitting: Obstetrics and Gynecology

## 2016-07-24 DIAGNOSIS — N921 Excessive and frequent menstruation with irregular cycle: Secondary | ICD-10-CM | POA: Insufficient documentation

## 2016-07-26 LAB — CYTOLOGY - PAP

## 2016-07-31 ENCOUNTER — Emergency Department (HOSPITAL_COMMUNITY): Payer: PRIVATE HEALTH INSURANCE

## 2016-07-31 ENCOUNTER — Inpatient Hospital Stay (HOSPITAL_COMMUNITY)
Admission: EM | Admit: 2016-07-31 | Discharge: 2016-08-04 | DRG: 392 | Disposition: A | Discharge status: Home / Self Care | Payer: Self-pay | Attending: Internal Medicine | Admitting: Internal Medicine

## 2016-07-31 ENCOUNTER — Encounter (HOSPITAL_COMMUNITY): Payer: Self-pay | Admitting: Emergency Medicine

## 2016-07-31 DIAGNOSIS — K5792 Diverticulitis of intestine, part unspecified, without perforation or abscess without bleeding: Secondary | ICD-10-CM | POA: Diagnosis present

## 2016-07-31 DIAGNOSIS — K920 Hematemesis: Secondary | ICD-10-CM

## 2016-07-31 DIAGNOSIS — Z6841 Body Mass Index (BMI) 40.0 and over, adult: Secondary | ICD-10-CM

## 2016-07-31 DIAGNOSIS — R0989 Other specified symptoms and signs involving the circulatory and respiratory systems: Secondary | ICD-10-CM

## 2016-07-31 DIAGNOSIS — K219 Gastro-esophageal reflux disease without esophagitis: Secondary | ICD-10-CM | POA: Diagnosis present

## 2016-07-31 DIAGNOSIS — R1310 Dysphagia, unspecified: Secondary | ICD-10-CM

## 2016-07-31 DIAGNOSIS — K5732 Diverticulitis of large intestine without perforation or abscess without bleeding: Principal | ICD-10-CM | POA: Diagnosis present

## 2016-07-31 DIAGNOSIS — Z8249 Family history of ischemic heart disease and other diseases of the circulatory system: Secondary | ICD-10-CM

## 2016-07-31 DIAGNOSIS — R1031 Right lower quadrant pain: Secondary | ICD-10-CM

## 2016-07-31 LAB — COMPREHENSIVE METABOLIC PANEL
ALK PHOS: 58 U/L (ref 38–126)
ALT: 15 U/L (ref 14–54)
ANION GAP: 6 (ref 5–15)
AST: 15 U/L (ref 15–41)
Albumin: 3.1 g/dL — ABNORMAL LOW (ref 3.5–5.0)
BUN: 9 mg/dL (ref 6–20)
CALCIUM: 8.3 mg/dL — AB (ref 8.9–10.3)
CO2: 25 mmol/L (ref 22–32)
Chloride: 105 mmol/L (ref 101–111)
Creatinine, Ser: 0.77 mg/dL (ref 0.44–1.00)
GFR calc non Af Amer: >60 mL/min (ref >60)
Glucose, Bld: 108 mg/dL — ABNORMAL HIGH (ref 65–99)
POTASSIUM: 3.8 mmol/L (ref 3.5–5.1)
Sodium: 136 mmol/L (ref 135–145)
TOTAL PROTEIN: 6.7 g/dL (ref 6.5–8.1)
Total Bilirubin: 0.5 mg/dL (ref 0.3–1.2)

## 2016-07-31 LAB — CBC WITH DIFFERENTIAL/PLATELET
BASOS ABS: 0 10*3/uL (ref 0.0–0.1)
Basophils Relative: 0 %
Eosinophils Absolute: 0.1 10*3/uL (ref 0.0–0.7)
Eosinophils Relative: 1 %
HCT: 32.9 % — ABNORMAL LOW (ref 36.0–46.0)
Hemoglobin: 10.8 g/dL — ABNORMAL LOW (ref 12.0–15.0)
LYMPHS ABS: 3 10*3/uL (ref 0.7–4.0)
LYMPHS PCT: 28 %
MCH: 30.1 pg (ref 26.0–34.0)
MCHC: 32.8 g/dL (ref 30.0–36.0)
MCV: 91.6 fL (ref 78.0–100.0)
MONO ABS: 0.9 10*3/uL (ref 0.1–1.0)
Monocytes Relative: 9 %
Neutro Abs: 6.6 10*3/uL (ref 1.7–7.7)
Neutrophils Relative %: 62 %
PLATELETS: 200 10*3/uL (ref 150–400)
RBC: 3.59 MIL/uL — ABNORMAL LOW (ref 3.87–5.11)
RDW: 15.7 % — AB (ref 11.5–15.5)
WBC: 10.5 10*3/uL (ref 4.0–10.5)

## 2016-07-31 LAB — I-STAT BETA HCG BLOOD, ED (MC, WL, AP ONLY): HCG, QUANTITATIVE: 11.7 m[IU]/mL — AB (ref <5)

## 2016-07-31 LAB — URINALYSIS, ROUTINE W REFLEX MICROSCOPIC
Bacteria, UA: NONE SEEN
Bilirubin Urine: NEGATIVE
GLUCOSE, UA: NEGATIVE mg/dL
Ketones, ur: NEGATIVE mg/dL
Leukocytes, UA: NEGATIVE
Nitrite: NEGATIVE
PH: 6 (ref 5.0–8.0)
Protein, ur: NEGATIVE mg/dL
SPECIFIC GRAVITY, URINE: 1.014 (ref 1.005–1.030)

## 2016-07-31 LAB — HCG, QUANTITATIVE, PREGNANCY: hCG, Beta Chain, Quant, S: <1 m[IU]/mL (ref <5)

## 2016-07-31 LAB — PREGNANCY, URINE: PREG TEST UR: NEGATIVE

## 2016-07-31 MED ORDER — ONDANSETRON HCL 4 MG/2ML IJ SOLN
4.0000 mg | Freq: Once | INTRAMUSCULAR | Status: AC
  Administered 2016-07-31: 4 mg via INTRAVENOUS
  Filled 2016-07-31: qty 2

## 2016-07-31 MED ORDER — NORGESTIMATE-ETH ESTRADIOL 0.25-35 MG-MCG PO TABS
1.0000 | ORAL_TABLET | Freq: Every day | ORAL | Status: DC
  Administered 2016-07-31 – 2016-08-02 (×3): 1 via ORAL

## 2016-07-31 MED ORDER — ONDANSETRON HCL 4 MG/2ML IJ SOLN
4.0000 mg | Freq: Four times a day (QID) | INTRAMUSCULAR | Status: DC | PRN
  Administered 2016-07-31 – 2016-08-03 (×5): 4 mg via INTRAVENOUS
  Filled 2016-07-31 (×6): qty 2

## 2016-07-31 MED ORDER — CIPROFLOXACIN IN D5W 400 MG/200ML IV SOLN
400.0000 mg | Freq: Two times a day (BID) | INTRAVENOUS | Status: DC
  Administered 2016-07-31 – 2016-08-04 (×8): 400 mg via INTRAVENOUS
  Filled 2016-07-31 (×8): qty 200

## 2016-07-31 MED ORDER — CIPROFLOXACIN IN D5W 400 MG/200ML IV SOLN
400.0000 mg | Freq: Once | INTRAVENOUS | Status: AC
  Administered 2016-07-31: 400 mg via INTRAVENOUS
  Filled 2016-07-31: qty 200

## 2016-07-31 MED ORDER — HYDROMORPHONE HCL 1 MG/ML IJ SOLN
1.0000 mg | Freq: Once | INTRAMUSCULAR | Status: AC
  Administered 2016-07-31: 1 mg via INTRAVENOUS
  Filled 2016-07-31: qty 1

## 2016-07-31 MED ORDER — HEPARIN SODIUM (PORCINE) 5000 UNIT/ML IJ SOLN
7500.0000 [IU] | Freq: Three times a day (TID) | INTRAMUSCULAR | Status: DC
  Administered 2016-07-31 – 2016-08-04 (×11): 7500 [IU] via SUBCUTANEOUS
  Filled 2016-07-31 (×11): qty 2

## 2016-07-31 MED ORDER — HYDROMORPHONE HCL 1 MG/ML IJ SOLN
1.0000 mg | Freq: Once | INTRAMUSCULAR | Status: AC
Start: 2016-07-31 — End: 2016-07-31
  Administered 2016-07-31: 1 mg via INTRAVENOUS

## 2016-07-31 MED ORDER — IOPAMIDOL (ISOVUE-300) INJECTION 61%
150.0000 mL | Freq: Once | INTRAVENOUS | Status: AC | PRN
  Administered 2016-07-31: 150 mL via INTRAVENOUS

## 2016-07-31 MED ORDER — PANTOPRAZOLE SODIUM 40 MG IV SOLR
INTRAVENOUS | Status: AC
  Filled 2016-07-31: qty 40

## 2016-07-31 MED ORDER — HYDROMORPHONE HCL 1 MG/ML IJ SOLN
INTRAMUSCULAR | Status: AC
  Administered 2016-07-31: 1 mg via INTRAVENOUS
  Filled 2016-07-31: qty 1

## 2016-07-31 MED ORDER — PANTOPRAZOLE SODIUM 40 MG IV SOLR
40.0000 mg | Freq: Two times a day (BID) | INTRAVENOUS | Status: DC
  Administered 2016-07-31 – 2016-08-01 (×3): 40 mg via INTRAVENOUS
  Filled 2016-07-31 (×2): qty 40

## 2016-07-31 MED ORDER — METRONIDAZOLE IN NACL 5-0.79 MG/ML-% IV SOLN
500.0000 mg | Freq: Three times a day (TID) | INTRAVENOUS | Status: DC
  Administered 2016-07-31 – 2016-08-04 (×11): 500 mg via INTRAVENOUS
  Filled 2016-07-31 (×11): qty 100

## 2016-07-31 MED ORDER — DEXTROSE-NACL 5-0.9 % IV SOLN
INTRAVENOUS | Status: DC
  Administered 2016-07-31 – 2016-08-01 (×2): via INTRAVENOUS

## 2016-07-31 MED ORDER — METRONIDAZOLE IN NACL 5-0.79 MG/ML-% IV SOLN
500.0000 mg | Freq: Once | INTRAVENOUS | Status: AC
  Administered 2016-07-31: 500 mg via INTRAVENOUS
  Filled 2016-07-31: qty 100

## 2016-07-31 MED ORDER — KETOROLAC TROMETHAMINE 30 MG/ML IJ SOLN
30.0000 mg | Freq: Four times a day (QID) | INTRAMUSCULAR | Status: DC | PRN
  Administered 2016-07-31 (×2): 30 mg via INTRAVENOUS
  Filled 2016-07-31: qty 1

## 2016-07-31 MED ORDER — HYDROMORPHONE HCL 1 MG/ML IJ SOLN
1.0000 mg | INTRAMUSCULAR | Status: DC | PRN
  Administered 2016-07-31 – 2016-08-04 (×19): 1 mg via INTRAVENOUS
  Filled 2016-07-31 (×19): qty 1

## 2016-07-31 MED ORDER — ACETAMINOPHEN 650 MG RE SUPP: 650.0000 mg | Freq: Four times a day (QID) | RECTAL | Status: DC | PRN

## 2016-07-31 MED ORDER — KETOROLAC TROMETHAMINE 30 MG/ML IJ SOLN
INTRAMUSCULAR | Status: AC
  Filled 2016-07-31: qty 1

## 2016-07-31 MED ORDER — ONDANSETRON HCL 4 MG PO TABS
4.0000 mg | ORAL_TABLET | Freq: Four times a day (QID) | ORAL | Status: DC | PRN
  Administered 2016-08-01 – 2016-08-02 (×2): 4 mg via ORAL
  Filled 2016-07-31 (×2): qty 1

## 2016-07-31 MED ORDER — ACETAMINOPHEN 325 MG PO TABS: 650.0000 mg | ORAL_TABLET | Freq: Four times a day (QID) | ORAL | Status: DC | PRN

## 2016-07-31 NOTE — ED Notes (Signed)
Pt requeting more pain med. edpa ware.

## 2016-07-31 NOTE — ED Notes (Signed)
Pt was crying in pain, holding her stomach and appeared very uncomfortable. MD ordered pain medication.  Immediately after giving Toradol pt becomes sleepy, drifting on and off to sleep and now she is asleep

## 2016-07-31 NOTE — ED Provider Notes (Signed)
[facility] DEPT Provider Note   CSN: [id number] Arrival date & time: 07/31/16  8101     History   Chief Complaint Chief Complaint  Patient presents with  . Abdominal Pain    HPI Marilyn Rivera is a 36 y.o. female.  Patient complains of abdominal pain. Patient states she has history of diverticulitis   The history is provided by the patient.  Abdominal Pain   This is a recurrent problem. The problem occurs constantly. The problem has not changed since onset.The pain is associated with an unknown factor. The pain is located in the RLQ. The pain is at a severity of 7/10. The pain is moderate. Pertinent negatives include diarrhea, flatus, frequency, hematuria and headaches. Nothing aggravates the symptoms.    Past Medical History:  Diagnosis Date  . Diverticulitis large intestine w/o perforation or abscess w/o bleeding 10/05/2014  . Diverticulosis   . GERD (gastroesophageal reflux disease)   . Helicobacter pylori gastritis 09/06/2014  . Kidney stones     Patient Active Problem List   Diagnosis Date Noted  . GERD (gastroesophageal reflux disease) 04/24/2016  . Diverticulitis large intestine w/o perforation or abscess w/o bleeding 10/05/2014  . Helicobacter pylori gastritis 09/06/2014  . Normocytic anemia   . Morbid obesity ([place]) 04/27/2014  . UTI (lower urinary tract infection) 04/27/2014    Past Surgical History:  Procedure Laterality Date  . COLONOSCOPY N/A 06/10/2014   Dr. Oneida Alar; redundant sigmoid colon, moderate diverticulosis in the sigmoid and descending colon. small internal hemorrhoids. Next screening at age 57.   Marland Kitchen ESOPHAGOGASTRODUODENOSCOPY N/A 06/10/2014   Dr. Oneida Alar: H.pylori gastritis s/p treatment with Amoxicillin and Biaxin  . KNEE SURGERY Right   . renal calculi removal Left 2010  . SKIN LESION EXCISION     over right eyebrow due to wax being left above eye and it seeped down into pore    OB History    No data available       Home  Medications    Prior to Admission medications   Medication Sig Start Date End Date Taking? Authorizing Provider  famotidine (PEPCID) 10 MG tablet Take 10 mg by mouth 2 (two) times daily.   Yes [provider]  ibuprofen (ADVIL,MOTRIN) 200 MG tablet Take 200-1,000 mg by mouth 2 (two) times daily as needed for mild pain or moderate pain.   Yes [provider]  levocetirizine (XYZAL) 5 MG tablet Take 5 mg by mouth every evening.   Yes [provider]  Multiple Vitamins-Minerals (CENTRUM ADULTS PO) Take 1 tablet by mouth daily.   Yes [provider]  norgestimate-ethinyl estradiol ([place] 28) 0.25-35 MG-MCG tablet Take 1 tablet by mouth daily.   Yes [provider]  omeprazole (PRILOSEC) 20 MG capsule 1 PO 30 MINS PRIOR TO BREAKFAST. 04/24/16  Yes Fields, Sandi L, MD  promethazine (PHENERGAN) 25 MG tablet Take 25 mg by mouth every 6 (six) hours as needed for nausea or vomiting.   Yes [provider]  HYDROcodone-acetaminophen (NORCO/VICODIN) 5-325 MG tablet Take 1 tablet by mouth every 6 (six) hours as needed for severe pain. Patient not taking: Reported on 07/31/2016 04/24/16   Danie Binder, MD    Family History Family History  Problem Relation Age of Onset  . Heart failure Mother   . Cancer Other        mother's side of family, breast cancer  . Cancer Other        father's side of family, leukemia  .  Hypertension Father   . Diabetes Other   . Hypertension Other   . Colon cancer Neg Hx     Social History Social History  Substance Use Topics  . Smoking status: Never Smoker  . Smokeless tobacco: Never Used  . Alcohol use No     Allergies   Patient has no known allergies.   Review of Systems Review of Systems  Constitutional: Negative for appetite change and fatigue.  HENT: Negative for congestion, ear discharge and sinus pressure.   Eyes: Negative for discharge.  Respiratory: Negative for cough.   Cardiovascular:  Negative for chest pain.  Gastrointestinal: Positive for abdominal pain. Negative for diarrhea and flatus.  Genitourinary: Negative for frequency and hematuria.  Musculoskeletal: Negative for back pain.  Skin: Negative for rash.  Neurological: Negative for seizures and headaches.  Psychiatric/Behavioral: Negative for hallucinations.     Physical Exam Updated Vital Signs BP 130/81   Pulse 96   Temp 98.3 F (36.8 C) (Oral)   Resp 19   Ht 5' 9.5" (1.765 m)   Wt (!) 180.5 kg (398 lb)   LMP 07/09/2016   SpO2 96%   BMI 57.93 kg/m   Physical Exam  Constitutional: She is oriented to person, place, and time. She appears well-developed.  HENT:  Head: Normocephalic.  Eyes: Conjunctivae and EOM are normal. No scleral icterus.  Neck: Neck supple. No thyromegaly present.  Cardiovascular: Normal rate and regular rhythm.  Exam reveals no gallop and no friction rub.   No murmur heard. Pulmonary/Chest: No stridor. She has no wheezes. She has no rales. She exhibits no tenderness.  Abdominal: She exhibits no distension. There is tenderness. There is no rebound.  Musculoskeletal: Normal range of motion. She exhibits no edema.  Lymphadenopathy:    She has no cervical adenopathy.  Neurological: She is oriented to person, place, and time. She exhibits normal muscle tone. Coordination normal.  Skin: No rash noted. No erythema.  Psychiatric: She has a normal mood and affect. Her behavior is normal.     ED Treatments / Results  Labs (all labs ordered are listed, but only abnormal results are displayed) Labs Reviewed  URINALYSIS, ROUTINE W REFLEX MICROSCOPIC - Abnormal; Notable for the following:       Result Value   Hgb urine dipstick LARGE (*)    Squamous Epithelial / LPF 0-5 (*)    All other components within normal limits  CBC WITH DIFFERENTIAL/PLATELET - Abnormal; Notable for the following:    RBC 3.59 (*)    Hemoglobin 10.8 (*)    HCT 32.9 (*)    RDW 15.7 (*)    All other  components within normal limits  COMPREHENSIVE METABOLIC PANEL - Abnormal; Notable for the following:    Glucose, Bld 108 (*)    Calcium 8.3 (*)    Albumin 3.1 (*)    All other components within normal limits  I-STAT BETA HCG BLOOD, ED ([facility], [facility], [facility] ONLY) - Abnormal; Notable for the following:    I-stat hCG, quantitative 11.7 (*)    All other components within normal limits  PREGNANCY, URINE  HCG, QUANTITATIVE, PREGNANCY    EKG  EKG Interpretation None       Radiology Ct Abdomen Pelvis W Contrast  Result Date: 07/31/2016 CLINICAL DATA:  36 year old female with history of left lower abdominal pain for 1 day. History of diverticulitis. EXAM: CT ABDOMEN AND PELVIS WITH CONTRAST TECHNIQUE: Multidetector CT imaging of the abdomen and pelvis was performed using the standard protocol  following bolus administration of intravenous contrast. CONTRAST:  168mL ISOVUE-300 IOPAMIDOL (ISOVUE-300) INJECTION 61% COMPARISON:  CT the abdomen and pelvis 04/19/2016. FINDINGS: Lower chest: Dependent bibasilar opacities in the lower lobes of the lungs bilaterally, favored to reflect areas of atelectasis, although some degree of pneumonitis could be present if there has been recent history of vomiting and concern for aspiration. Hepatobiliary: No suspicious cystic or solid hepatic lesions. No intra or extrahepatic biliary ductal dilatation. Gallbladder is normal in appearance. Pancreas: No pancreatic mass. No pancreatic ductal dilatation. No pancreatic or peripancreatic fluid or inflammatory changes. Spleen: Unremarkable. Adrenals/Urinary Tract: Bilateral kidneys and bilateral adrenal glands are normal in appearance. No hydroureteronephrosis. Left side of the urinary bladder is markedly thickened is immediately adjacent to the area of acute diverticular inflammation (see below). No gas within the lumen of the urinary bladder at this time. Stomach/Bowel: Normal appearance of the stomach. No pathologic dilatation of small  bowel or colon. Numerous colonic diverticulae are noted. In the region of the proximal sigmoid colon there is focal wall thickening and extensive surrounding inflammatory changes associated with some diverticulae, compatible with acute diverticulitis. Notably, this is intimately associated with the left lateral wall of the urinary bladder which is markedly thickened (best appreciated on axial image 79 of series 2 and coronal image 51 of series 5), the appearance of which is concerning for direct involvement of the urinary bladder or with this infectious/inflammatory process. At this time, there is no definite colovesical fistula (i.e., no definite gas in the lumen of the urinary bladder). Appendix is normal in appearance. There is a small amount of periappendiceal free fluid. Vascular/Lymphatic: Aortic atherosclerosis (mild), without evidence of aneurysm or dissection in the abdominal or pelvic vasculature. No lymphadenopathy noted in the abdomen or pelvis. Reproductive: Uterus and ovaries are unremarkable in appearance. Other: Trace volume of free fluid in the low anatomic pelvis could be reactive, or could be physiologic. No larger volume of ascites. No pneumoperitoneum. Musculoskeletal: There are no aggressive appearing lytic or blastic lesions noted in the visualized portions of the skeleton. IMPRESSION: 1. Acute diverticulitis of the proximal sigmoid colon. Notably, the inflamed diverticulum is intimately associated with the left lateral wall of the urinary bladder. The appearance of this area is concerning for potential developing colovesical fistula, although no frank fistula is confidently identified at this time. Surgical consultation is recommended. 2. Small volume of free fluid in the low anatomic pelvis could be reactive or physiologic in this young female patient. There is also a trace amount of periappendiceal free fluid. This is nonspecific. The appendix itself appears normal. 3. Aortic  atherosclerosis. 4. Dependent opacities in the lower lobes of the lungs bilaterally, likely to reflect areas of subsegmental atelectasis. If there has been a recent history of emesis, clinical correlation for signs and symptoms of aspiration pneumonitis is suggested. Electronically Signed   By: Vinnie Langton M.D.   On: 07/31/2016 10:28    Procedures Procedures (including critical care time)  Medications Ordered in ED Medications  ciprofloxacin (CIPRO) IVPB 400 mg (not administered)  metroNIDAZOLE (FLAGYL) IVPB 500 mg (not administered)  HYDROmorphone (DILAUDID) injection 1 mg (1 mg Intravenous Given 07/31/16 0744)  ondansetron (ZOFRAN) injection 4 mg (4 mg Intravenous Given 07/31/16 0744)  HYDROmorphone (DILAUDID) injection 1 mg (1 mg Intravenous Given 07/31/16 0811)  iopamidol (ISOVUE-300) 61 % injection 150 mL (150 mLs Intravenous Contrast Given 07/31/16 0958)  HYDROmorphone (DILAUDID) injection 1 mg (1 mg Intravenous Given 07/31/16 1032)     Initial Impression /  Assessment and Plan / ED Course  I have reviewed the triage vital signs and the nursing notes.  Pertinent labs & imaging results that were available during my care of the patient were reviewed by me and considered in my medical decision making (see chart for details).     Patient with diverticulitis. She'll be admitted by medicine and given IV antibiotics. Surgery has been consult at the follow patient to  Final Clinical Impressions(s) / ED Diagnoses   Final diagnoses:  Right lower quadrant abdominal pain    New Prescriptions New Prescriptions   No medications on file     Milton Ferguson, MD 07/31/16 1139

## 2016-07-31 NOTE — H&P (Signed)
History and Physical    Marilyn Rivera LKG:[id number] DOB: September 27, 1980 DOA: 07/31/2016  PCP: Patient, No Pcp Per  Patient coming from: Home.    Chief Complaint:  LLQ pain for one day.   HPI: Marilyn Rivera is an 36 y.o. female with hx of recurrent diverticulitis (greater than 5 times per husband), having had seen surgeon at [facility] and planned for up coming surgery, hx of morbid obesity, GERD, and Helicobacter pylori, presented to the ER with one day hx of LLQ pain.  She has no fever, chills, black or blood stool.  Work up in the ER included a normal WBC, normal renal fxt tests, and Hb of 10.8 gr per dL.  CT of the abdomen confirmed diverticulitis, no evidence of perforation, and queried early fistula formation to the bladder.  Her UA is negative.  She was started on IV Cipro and IV Flagyl, consulted surgery, and hospitalist was asked to admit her for further evaluation.    ED Course:  See above.  Rewiew of Systems:  Constitutional: Negative for malaise, fever and chills. No significant weight loss or weight gain Eyes: Negative for eye pain, redness and discharge, diplopia, visual changes, or flashes of light. ENMT: Negative for ear pain, hoarseness, nasal congestion, sinus pressure and sore throat. No headaches; tinnitus, drooling, or problem swallowing. Cardiovascular: Negative for chest pain, palpitations, diaphoresis, dyspnea and peripheral edema. ; No orthopnea, PND Respiratory: Negative for cough, hemoptysis, wheezing and stridor. No pleuritic chestpain. Gastrointestinal: Negative for diarrhea, constipation,  melena, blood in stool, hematemesis, jaundice and rectal bleeding.    Genitourinary: Negative for frequency, dysuria, incontinence,flank pain and hematuria; Musculoskeletal: Negative for back pain and neck pain. Negative for swelling and trauma.;  Skin: . Negative for pruritus, rash, abrasions, bruising and skin lesion.; ulcerations Neuro: Negative for headache, lightheadedness and  neck stiffness. Negative for weakness, altered level of consciousness , altered mental status, extremity weakness, burning feet, involuntary movement, seizure and syncope.  Psych: negative for anxiety, depression, insomnia, tearfulness, panic attacks, hallucinations, paranoia, suicidal or homicidal ideation    Past Medical History:  Diagnosis Date  . Diverticulitis large intestine w/o perforation or abscess w/o bleeding 10/05/2014  . Diverticulosis   . GERD (gastroesophageal reflux disease)   . Helicobacter pylori gastritis 09/06/2014  . Kidney stones     Past Surgical History:  Procedure Laterality Date  . COLONOSCOPY N/A 06/10/2014   Dr. Oneida Alar; redundant sigmoid colon, moderate diverticulosis in the sigmoid and descending colon. small internal hemorrhoids. Next screening at age 73.   Marland Kitchen ESOPHAGOGASTRODUODENOSCOPY N/A 06/10/2014   Dr. Oneida Alar: H.pylori gastritis s/p treatment with Amoxicillin and Biaxin  . KNEE SURGERY Right   . renal calculi removal Left 2010  . SKIN LESION EXCISION     over right eyebrow due to wax being left above eye and it seeped down into pore     reports that she has never smoked. She has never used smokeless tobacco. She reports that she does not drink alcohol or use drugs.  No Known Allergies  Family History  Problem Relation Age of Onset  . Heart failure Mother   . Cancer Other        mother's side of family, breast cancer  . Cancer Other        father's side of family, leukemia  . Hypertension Father   . Diabetes Other   . Hypertension Other   . Colon cancer Neg Hx      Prior to Admission medications  Medication Sig Start Date End Date Taking? Authorizing Provider  famotidine (PEPCID) 10 MG tablet Take 10 mg by mouth 2 (two) times daily.   Yes [provider]  ibuprofen (ADVIL,MOTRIN) 200 MG tablet Take 200-1,000 mg by mouth 2 (two) times daily as needed for mild pain or moderate pain.   Yes [provider]  levocetirizine  (XYZAL) 5 MG tablet Take 5 mg by mouth every evening.   Yes [provider]  Multiple Vitamins-Minerals (CENTRUM ADULTS PO) Take 1 tablet by mouth daily.   Yes [provider]  norgestimate-ethinyl estradiol ([place] 28) 0.25-35 MG-MCG tablet Take 1 tablet by mouth daily.   Yes [provider]  omeprazole (PRILOSEC) 20 MG capsule 1 PO 30 MINS PRIOR TO BREAKFAST. 04/24/16  Yes Fields, Sandi L, MD  promethazine (PHENERGAN) 25 MG tablet Take 25 mg by mouth every 6 (six) hours as needed for nausea or vomiting.   Yes [provider]  HYDROcodone-acetaminophen (NORCO/VICODIN) 5-325 MG tablet Take 1 tablet by mouth every 6 (six) hours as needed for severe pain. Patient not taking: Reported on 07/31/2016 04/24/16   Danie Binder, MD    Physical Exam: Vitals:   07/31/16 0930 07/31/16 1030 07/31/16 1100 07/31/16 1130  BP: 129/66 117/80 121/69 130/81  Pulse: 84 92 92 96  Resp:      Temp:      TempSrc:      SpO2: 91% 98% 98% 96%  Weight:      Height:          Constitutional: NAD, calm, comfortable Vitals:   07/31/16 0930 07/31/16 1030 07/31/16 1100 07/31/16 1130  BP: 129/66 117/80 121/69 130/81  Pulse: 84 92 92 96  Resp:      Temp:      TempSrc:      SpO2: 91% 98% 98% 96%  Weight:      Height:       Eyes: PERRL, lids and conjunctivae normal ENMT: Mucous membranes are moist. Posterior pharynx clear of any exudate or lesions.Normal dentition.  Neck: normal, supple, no masses, no thyromegaly Respiratory: clear to auscultation bilaterally, no wheezing, no crackles. Normal respiratory effort. No accessory muscle use.  Cardiovascular: Regular rate and rhythm, no murmurs / rubs / gallops. No extremity edema. 2+ pedal pulses. No carotid bruits.  Abdomen: LLQ tenderness.  no masses palpated. No hepatosplenomegaly. Bowel sounds positive.  Musculoskeletal: no clubbing / cyanosis. No joint deformity upper and lower extremities. Good ROM, no contractures. Normal  muscle tone.  Skin: no rashes, lesions, ulcers. No induration Neurologic: CN 2-12 grossly intact. Sensation intact, DTR normal. Strength 5/5 in all 4.  Psychiatric: Normal judgment and insight. Alert and oriented x 3. Normal mood.    Labs on Admission: I have personally reviewed following labs and imaging studies CBC:  Recent Labs Lab 07/31/16 0710  WBC 10.5  NEUTROABS 6.6  HGB 10.8*  HCT 32.9*  MCV 91.6  PLT 884   Basic Metabolic Panel:  Recent Labs Lab 07/31/16 0815  NA 136  K 3.8  CL 105  CO2 25  GLUCOSE 108*  BUN 9  CREATININE 0.77  CALCIUM 8.3*   GFR: Estimated Creatinine Clearance: 172.8 mL/min (by C-G formula based on SCr of 0.77 mg/dL). Liver Function Tests:  Recent Labs Lab 07/31/16 0815  AST 15  ALT 15  ALKPHOS 58  BILITOT 0.5  PROT 6.7  ALBUMIN 3.1*   Urine analysis:    Component Value Date/Time   COLORURINE YELLOW 07/31/2016 0704  APPEARANCEUR CLEAR 07/31/2016 0704   LABSPEC 1.014 07/31/2016 0704   PHURINE 6.0 07/31/2016 0704   GLUCOSEU NEGATIVE 07/31/2016 0704   HGBUR LARGE (A) 07/31/2016 0704   BILIRUBINUR NEGATIVE 07/31/2016 0704   KETONESUR NEGATIVE 07/31/2016 0704   PROTEINUR NEGATIVE 07/31/2016 0704   UROBILINOGEN 0.2 10/05/2014 0028   NITRITE NEGATIVE 07/31/2016 0704   LEUKOCYTESUR NEGATIVE 07/31/2016 0704    Radiological Exams on Admission: Ct Abdomen Pelvis W Contrast  Result Date: 07/31/2016 CLINICAL DATA:  36 year old female with history of left lower abdominal pain for 1 day. History of diverticulitis. EXAM: CT ABDOMEN AND PELVIS WITH CONTRAST TECHNIQUE: Multidetector CT imaging of the abdomen and pelvis was performed using the standard protocol following bolus administration of intravenous contrast. CONTRAST:  18mL ISOVUE-300 IOPAMIDOL (ISOVUE-300) INJECTION 61% COMPARISON:  CT the abdomen and pelvis 04/19/2016. FINDINGS: Lower chest: Dependent bibasilar opacities in the lower lobes of the lungs bilaterally, favored to  reflect areas of atelectasis, although some degree of pneumonitis could be present if there has been recent history of vomiting and concern for aspiration. Hepatobiliary: No suspicious cystic or solid hepatic lesions. No intra or extrahepatic biliary ductal dilatation. Gallbladder is normal in appearance. Pancreas: No pancreatic mass. No pancreatic ductal dilatation. No pancreatic or peripancreatic fluid or inflammatory changes. Spleen: Unremarkable. Adrenals/Urinary Tract: Bilateral kidneys and bilateral adrenal glands are normal in appearance. No hydroureteronephrosis. Left side of the urinary bladder is markedly thickened is immediately adjacent to the area of acute diverticular inflammation (see below). No gas within the lumen of the urinary bladder at this time. Stomach/Bowel: Normal appearance of the stomach. No pathologic dilatation of small bowel or colon. Numerous colonic diverticulae are noted. In the region of the proximal sigmoid colon there is focal wall thickening and extensive surrounding inflammatory changes associated with some diverticulae, compatible with acute diverticulitis. Notably, this is intimately associated with the left lateral wall of the urinary bladder which is markedly thickened (best appreciated on axial image 79 of series 2 and coronal image 51 of series 5), the appearance of which is concerning for direct involvement of the urinary bladder or with this infectious/inflammatory process. At this time, there is no definite colovesical fistula (i.e., no definite gas in the lumen of the urinary bladder). Appendix is normal in appearance. There is a small amount of periappendiceal free fluid. Vascular/Lymphatic: Aortic atherosclerosis (mild), without evidence of aneurysm or dissection in the abdominal or pelvic vasculature. No lymphadenopathy noted in the abdomen or pelvis. Reproductive: Uterus and ovaries are unremarkable in appearance. Other: Trace volume of free fluid in the low  anatomic pelvis could be reactive, or could be physiologic. No larger volume of ascites. No pneumoperitoneum. Musculoskeletal: There are no aggressive appearing lytic or blastic lesions noted in the visualized portions of the skeleton. IMPRESSION: 1. Acute diverticulitis of the proximal sigmoid colon. Notably, the inflamed diverticulum is intimately associated with the left lateral wall of the urinary bladder. The appearance of this area is concerning for potential developing colovesical fistula, although no frank fistula is confidently identified at this time. Surgical consultation is recommended. 2. Small volume of free fluid in the low anatomic pelvis could be reactive or physiologic in this young female patient. There is also a trace amount of periappendiceal free fluid. This is nonspecific. The appendix itself appears normal. 3. Aortic atherosclerosis. 4. Dependent opacities in the lower lobes of the lungs bilaterally, likely to reflect areas of subsegmental atelectasis. If there has been a recent history of emesis, clinical correlation for signs  and symptoms of aspiration pneumonitis is suggested. Electronically Signed   By: Vinnie Langton M.D.   On: 07/31/2016 10:28    EKG: Independently reviewed.   Assessment/Plan Active Problems:   Morbid obesity ([place])   Diverticulitis large intestine w/o perforation or abscess w/o bleeding   GERD (gastroesophageal reflux disease)   Acute diverticulitis   PLAN:   Acute recurrent diverticulitis: She had seen a surgeon at [facility].  Will admit for IV antibiotics.   She will be given clear liquid and IVF.  Note early formation of fistula, so more reason to follow up with her surgeon.    Obesity:  Noted.  GERD:  Will continue with PPI, given IV.   DVT prophylaxis: subQ heparin.  Code Status: FULL CODE.  Family Communication: husband at bedside.  Disposition Plan: home.  Consults called: surgery per EDP.  Admission status: inpatient.   Guelda Batson MD  FACP. [facility]  If 7PM-7AM, please contact night-coverage [email or web address] Password [email or web address]  07/31/2016, 12:26 PM

## 2016-07-31 NOTE — ED Notes (Signed)
Report given to 3rd floor.

## 2016-07-31 NOTE — ED Notes (Signed)
Call to 3rd floor.  RN to call me back for report

## 2016-07-31 NOTE — ED Triage Notes (Signed)
Pt reports LLQ pain for last several days with nausea and pain becoming worse last night. Pt also reports recently started birth control pills to help regulate period. Pt reports current period has lasted x3 weeks.

## 2016-07-31 NOTE — ED Notes (Signed)
Pt takne to ct 

## 2016-08-01 DIAGNOSIS — K219 Gastro-esophageal reflux disease without esophagitis: Secondary | ICD-10-CM

## 2016-08-01 LAB — HIV ANTIBODY (ROUTINE TESTING W REFLEX): HIV Screen 4th Generation wRfx: NONREACTIVE

## 2016-08-01 MED ORDER — SALINE SPRAY 0.65 % NA SOLN: 1.0000 | NASAL | Status: DC | PRN

## 2016-08-01 MED ORDER — PROMETHAZINE HCL 25 MG/ML IJ SOLN
25.0000 mg | Freq: Once | INTRAMUSCULAR | Status: AC
  Administered 2016-08-01: 25 mg via INTRAVENOUS
  Filled 2016-08-01: qty 1

## 2016-08-01 MED ORDER — PANTOPRAZOLE SODIUM 40 MG PO TBEC
40.0000 mg | DELAYED_RELEASE_TABLET | Freq: Every day | ORAL | Status: DC
  Administered 2016-08-02 – 2016-08-04 (×3): 40 mg via ORAL
  Filled 2016-08-01 (×3): qty 1

## 2016-08-01 NOTE — Progress Notes (Signed)
PROGRESS NOTE    ARMINA GALLOWAY  QQP:[id number] DOB: 1980/11/15 DOA: 07/31/2016 PCP: Patient, No Pcp Per    Brief Narrative: SAMANTHA RAGEN is an 36 y.o. female with hx of recurrent diverticulitis (greater than 5 times per husband), having had seen surgeon at [facility] and planned for up coming surgery, hx of morbid obesity, GERD, and Helicobacter pylori, presented to the ER with one day hx of LLQ pain.  She has no fever, chills, black or blood stool.  Work up in the ER included a normal WBC, normal renal fxt tests, and Hb of 10.8 gr per dL.  CT of the abdomen confirmed diverticulitis, no evidence of perforation, and queried early fistula formation to the bladder.  Her UA is negative.  She was given CIpro and Flagyl, and pain is better.  She has some coughs with blood tingued sputum.   Assessment & Plan:   Active Problems:   Morbid obesity ([place])   Diverticulitis large intestine w/o perforation or abscess w/o bleeding   GERD (gastroesophageal reflux disease)   Acute diverticulitis   1. Diverticulitis:  Continue with IV Cipro and Flagyl.  Advance diet to full.  D/C IVF. 2. Blood tingued sputum:  Will d/c Toradol.  Start Nasal saline.   3. GERD:  Change IV PPI to oral. 4. Obesity:  Will obtain polysomnogram outpatient.   DVT prophylaxis: Heparin Code Status: FULL CODE. Family Communication: Husband and father. Disposition Plan: Home.   Consultants:   None.   Procedures:     Antimicrobials: Anti-infectives    Start     Dose/Rate Route Frequency Ordered Stop   07/31/16 2200  metroNIDAZOLE (FLAGYL) IVPB 500 mg     500 mg 100 mL/hr over 60 Minutes Intravenous Every 8 hours 07/31/16 1935     07/31/16 2200  ciprofloxacin (CIPRO) IVPB 400 mg     400 mg 200 mL/hr over 60 Minutes Intravenous Every 12 hours 07/31/16 2006     07/31/16 1115  ciprofloxacin (CIPRO) IVPB 400 mg     400 mg 200 mL/hr over 60 Minutes Intravenous  Once 07/31/16 1103 07/31/16 1245   07/31/16 1115  metroNIDAZOLE  (FLAGYL) IVPB 500 mg     500 mg 100 mL/hr over 60 Minutes Intravenous  Once 07/31/16 1103 07/31/16 1316       Subjective:   Doing better.  Not as much pain.    Objective: Vitals:   07/31/16 2134 07/31/16 2239 08/01/16 0400 08/01/16 0644  BP:  (!) 127/57  124/61  Pulse:  85  79  Resp:  20  18  Temp:  98.4 F (36.9 C) 98.8 F (37.1 C) 97.6 F (36.4 C)  TempSrc:  Oral Oral Oral  SpO2: 92% 96%  95%  Weight:      Height:        Intake/Output Summary (Last 24 hours) at 08/01/16 1220 Last data filed at 08/01/16 0300  Gross per 24 hour  Intake          1576.67 ml  Output              300 ml  Net          1276.67 ml   Filed Weights   07/31/16 0701 07/31/16 1653  Weight: (!) 180.5 kg (398 lb) (!) 180 kg (396 lb 14.4 oz)    Examination:  General exam: Appears calm and comfortable  Respiratory system: Clear to auscultation. Respiratory effort normal. Cardiovascular system: S1 & S2 heard, RRR. No JVD, murmurs,  rubs, gallops or clicks. No pedal edema. Gastrointestinal system: Abdomen is nondistended, soft with LLQ tenderness. No organomegaly or masses felt. Normal bowel sounds heard. Central nervous system: Alert and oriented. No focal neurological deficits. Extremities: Symmetric 5 x 5 power. Skin: No rashes, lesions or ulcers Psychiatry: Judgement and insight appear normal. Mood & affect appropriate.   Data Reviewed: I have personally reviewed following labs and imaging studies  CBC:  Recent Labs Lab 07/31/16 0710  WBC 10.5  NEUTROABS 6.6  HGB 10.8*  HCT 32.9*  MCV 91.6  PLT 229   Basic Metabolic Panel:  Recent Labs Lab 07/31/16 0815  NA 136  K 3.8  CL 105  CO2 25  GLUCOSE 108*  BUN 9  CREATININE 0.77  CALCIUM 8.3*   GFR: Estimated Creatinine Clearance: 171.4 mL/min (by C-G formula based on SCr of 0.77 mg/dL). Liver Function Tests:  Recent Labs Lab 07/31/16 0815  AST 15  ALT 15  ALKPHOS 58  BILITOT 0.5  PROT 6.7  ALBUMIN 3.1*     Radiology Studies: Ct Abdomen Pelvis W Contrast  Result Date: 07/31/2016 CLINICAL DATA:  36 year old female with history of left lower abdominal pain for 1 day. History of diverticulitis. EXAM: CT ABDOMEN AND PELVIS WITH CONTRAST TECHNIQUE: Multidetector CT imaging of the abdomen and pelvis was performed using the standard protocol following bolus administration of intravenous contrast. CONTRAST:  138mL ISOVUE-300 IOPAMIDOL (ISOVUE-300) INJECTION 61% COMPARISON:  CT the abdomen and pelvis 04/19/2016. FINDINGS: Lower chest: Dependent bibasilar opacities in the lower lobes of the lungs bilaterally, favored to reflect areas of atelectasis, although some degree of pneumonitis could be present if there has been recent history of vomiting and concern for aspiration. Hepatobiliary: No suspicious cystic or solid hepatic lesions. No intra or extrahepatic biliary ductal dilatation. Gallbladder is normal in appearance. Pancreas: No pancreatic mass. No pancreatic ductal dilatation. No pancreatic or peripancreatic fluid or inflammatory changes. Spleen: Unremarkable. Adrenals/Urinary Tract: Bilateral kidneys and bilateral adrenal glands are normal in appearance. No hydroureteronephrosis. Left side of the urinary bladder is markedly thickened is immediately adjacent to the area of acute diverticular inflammation (see below). No gas within the lumen of the urinary bladder at this time. Stomach/Bowel: Normal appearance of the stomach. No pathologic dilatation of small bowel or colon. Numerous colonic diverticulae are noted. In the region of the proximal sigmoid colon there is focal wall thickening and extensive surrounding inflammatory changes associated with some diverticulae, compatible with acute diverticulitis. Notably, this is intimately associated with the left lateral wall of the urinary bladder which is markedly thickened (best appreciated on axial image 79 of series 2 and coronal image 51 of series 5), the  appearance of which is concerning for direct involvement of the urinary bladder or with this infectious/inflammatory process. At this time, there is no definite colovesical fistula (i.e., no definite gas in the lumen of the urinary bladder). Appendix is normal in appearance. There is a small amount of periappendiceal free fluid. Vascular/Lymphatic: Aortic atherosclerosis (mild), without evidence of aneurysm or dissection in the abdominal or pelvic vasculature. No lymphadenopathy noted in the abdomen or pelvis. Reproductive: Uterus and ovaries are unremarkable in appearance. Other: Trace volume of free fluid in the low anatomic pelvis could be reactive, or could be physiologic. No larger volume of ascites. No pneumoperitoneum. Musculoskeletal: There are no aggressive appearing lytic or blastic lesions noted in the visualized portions of the skeleton. IMPRESSION: 1. Acute diverticulitis of the proximal sigmoid colon. Notably, the inflamed diverticulum is intimately associated  with the left lateral wall of the urinary bladder. The appearance of this area is concerning for potential developing colovesical fistula, although no frank fistula is confidently identified at this time. Surgical consultation is recommended. 2. Small volume of free fluid in the low anatomic pelvis could be reactive or physiologic in this young female patient. There is also a trace amount of periappendiceal free fluid. This is nonspecific. The appendix itself appears normal. 3. Aortic atherosclerosis. 4. Dependent opacities in the lower lobes of the lungs bilaterally, likely to reflect areas of subsegmental atelectasis. If there has been a recent history of emesis, clinical correlation for signs and symptoms of aspiration pneumonitis is suggested. Electronically Signed   By: Vinnie Langton M.D.   On: 07/31/2016 10:28    Scheduled Meds: . heparin  7,500 Units Subcutaneous Q8H  . norgestimate-ethinyl estradiol  1 tablet Oral Daily  . [START  ON 08/02/2016] pantoprazole  40 mg Oral Q0600   Continuous Infusions: . ciprofloxacin Stopped (08/01/16 0926)  . metronidazole 500 mg (08/01/16 0557)     LOS: 1 day   Jamarquis Crull, MD FACP Hospitalist.   If 7PM-7AM, please contact night-coverage [email or web address] Password [email or web address] 08/01/2016, 12:20 PM

## 2016-08-02 ENCOUNTER — Inpatient Hospital Stay (HOSPITAL_COMMUNITY): Payer: PRIVATE HEALTH INSURANCE

## 2016-08-02 DIAGNOSIS — R1310 Dysphagia, unspecified: Secondary | ICD-10-CM

## 2016-08-02 DIAGNOSIS — R1031 Right lower quadrant pain: Secondary | ICD-10-CM

## 2016-08-02 DIAGNOSIS — K920 Hematemesis: Secondary | ICD-10-CM

## 2016-08-02 MED ORDER — ONDANSETRON HCL 4 MG/2ML IJ SOLN
4.0000 mg | Freq: Once | INTRAMUSCULAR | Status: AC
  Administered 2016-08-02: 4 mg via INTRAVENOUS

## 2016-08-02 MED ORDER — DEXTROSE-NACL 5-0.9 % IV SOLN
INTRAVENOUS | Status: DC
  Administered 2016-08-02 – 2016-08-03 (×2): via INTRAVENOUS

## 2016-08-02 NOTE — Progress Notes (Signed)
PROGRESS NOTE    Marilyn Rivera  AYT:[id number] DOB: 12/27/1980 DOA: 07/31/2016 PCP: Patient, No Pcp Per    Brief Narrative:  Marilyn Rivera an 36 y.o.femalewith hx of recurrent diverticulitis (greater than 5 times per husband), having had seen surgeon at [facility] and planned for up coming surgery, hx of morbid obesity, GERD, and Helicobacter pylori, presented to the ER with one day hx of LLQ pain. She has no fever, chills, black or blood stool. Work up in the ER included a normal WBC, normal renal fxt tests, and Hb of 10.8 gr per dL. CT of the abdomen confirmed diverticulitis, no evidence of perforation, and queried early fistula formation to the bladder. Her UA is negative. She was given CIpro and Flagyl, and pain is better.  She has some coughs with blood tingued sputum. She continue to feel dysphagia.     Assessment & Plan:   Active Problems:   Morbid obesity ([place])   Diverticulitis large intestine w/o perforation or abscess w/o bleeding   GERD (gastroesophageal reflux disease)   Acute diverticulitis   1. Acute diverticulitis:  She is doing better, having less pain.  Continue with IV Cipro and Flagyl.  Will make NPO due to dysphagia. 2. Dysphagia:  Exam is normal.  Will give IVF, make NPO.  I will consult GI for further recommendation.  3. Obesity:  Will need polysomnogram.   DVT prophylaxis: Heparin SQ.  Code Status: FULL CODE.  Family Communication: husband at bedside.  Disposition Plan: Home.   Consultants:   GI.   Procedures:   None.   Antimicrobials: Anti-infectives    Start     Dose/Rate Route Frequency Ordered Stop   07/31/16 2200  metroNIDAZOLE (FLAGYL) IVPB 500 mg     500 mg 100 mL/hr over 60 Minutes Intravenous Every 8 hours 07/31/16 1935     07/31/16 2200  ciprofloxacin (CIPRO) IVPB 400 mg     400 mg 200 mL/hr over 60 Minutes Intravenous Every 12 hours 07/31/16 2006     07/31/16 1115  ciprofloxacin (CIPRO) IVPB 400 mg     400 mg 200 mL/hr over 60  Minutes Intravenous  Once 07/31/16 1103 07/31/16 1245   07/31/16 1115  metroNIDAZOLE (FLAGYL) IVPB 500 mg     500 mg 100 mL/hr over 60 Minutes Intravenous  Once 07/31/16 1103 07/31/16 1316       Subjective:    I feel like there is something stuck in my throat.   Objective: Vitals:   08/01/16 0400 08/01/16 0644 08/01/16 2000 08/02/16 0731  BP:  124/61 128/67 (!) 154/76  Pulse:  79 82 68  Resp:  18 18 18   Temp: 98.8 F (37.1 C) 97.6 F (36.4 C) 98.7 F (37.1 C) 98.9 F (37.2 C)  TempSrc: Oral Oral Oral Oral  SpO2:  95% 96% 97%  Weight:      Height:        Intake/Output Summary (Last 24 hours) at 08/02/16 1237 Last data filed at 08/02/16 3557  Gross per 24 hour  Intake              880 ml  Output                0 ml  Net              880 ml   Filed Weights   07/31/16 0701 07/31/16 1653  Weight: (!) 180.5 kg (398 lb) (!) 180 kg (396 lb 14.4 oz)  Examination:  General exam: Appears calm and comfortable  Respiratory system: Clear to auscultation. Respiratory effort normal. Cardiovascular system: S1 & S2 heard, RRR. No JVD, murmurs, rubs, gallops or clicks. No pedal edema. Gastrointestinal system: Abdomen is nondistended, soft and nontender. No organomegaly or masses felt. Normal bowel sounds heard. Central nervous system: Alert and oriented. No focal neurological deficits. Extremities: Symmetric 5 x 5 power. Skin: No rashes, lesions or ulcers Psychiatry: Judgement and insight appear normal. Mood & affect appropriate.   Data Reviewed: I have personally reviewed following labs and imaging studies  CBC:  Recent Labs Lab 07/31/16 0710  WBC 10.5  NEUTROABS 6.6  HGB 10.8*  HCT 32.9*  MCV 91.6  PLT 644   Basic Metabolic Panel:  Recent Labs Lab 07/31/16 0815  NA 136  K 3.8  CL 105  CO2 25  GLUCOSE 108*  BUN 9  CREATININE 0.77  CALCIUM 8.3*   GFR: Estimated Creatinine Clearance: 171.4 mL/min (by C-G formula based on SCr of 0.77 mg/dL). Liver  Function Tests:  Recent Labs Lab 07/31/16 0815  AST 15  ALT 15  ALKPHOS 58  BILITOT 0.5  PROT 6.7  ALBUMIN 3.1*     Radiology Studies:  Scheduled Meds: . heparin  7,500 Units Subcutaneous Q8H  . norgestimate-ethinyl estradiol  1 tablet Oral Daily  . pantoprazole  40 mg Oral Q0600   Continuous Infusions: . ciprofloxacin Stopped (08/02/16 1048)  . dextrose 5 % and 0.9% NaCl    . metronidazole Stopped (08/02/16 0347)    Orvan Falconer, MD FACP Hospitalist.   If 7PM-7AM, please contact night-coverage [email or web address] Password [facility] 08/02/2016, 12:37 PM

## 2016-08-02 NOTE — Consult Note (Signed)
REVIEWED. BARIUM PILL ESOPHAGRAM TODAY. RESULTS PENDING.  Referring Provider: No ref. provider found Primary Care Physician:  Patient, No Pcp Per Primary Gastroenterologist:  Dr. Oneida Alar  Date of Admission: 07/31/16 Date of Consultation: 08/02/16 (consulted 08/02/16)  Reason for Consultation:  "dysphagia"  HPI:  Marilyn Rivera is a 36 y.o. female with a past medical history of diverticulosis, diverticulitis, GERD, H. pylori gastritis. The patient has had multiple admissions for diverticulitis. She presented to the emergency room on 07/31/2016 complaining of abdominal pain in the right lower quadrant or left lower quadrant. No rectal bleeding, fever, chills. Normal white blood cell count and the emergency room, hemoglobin 6.8 g. She did have hematuria. CT abdomen confirmed diverticulitis without perforation but possible early fistula formation to the bladder. She was admitted and started on IV Cipro and Flagyl, surgery was consult. She has been seen at [facility] surgery and has surgery upcoming soon. This episode is all the more reason to proceed with elective partial colon resection.  Nurses note dated yesterday states the patient complained of a detached tissue in the back of her throat which resulted in blood and mucus coming from her throat and nose and choking. She subsequently now feels this "skin "is irritating each Tolerated but no respiratory distress or difficulty swallowing liquids.  Today she states she was doing ok, no significant coughing until yetserday when she felt a "piece of tissue" when her throat was dry and ate some jello. Had coughing at that time and "blood and mucus started running out of my nose/mouth."States her lower abdominal pain is persistent and has surgery scheduled for this month. No epigastric pain or upper GI symptoms. Was tolerating diet well but had vomiting earlier today "with a lot of blood in it." I spoke with the nursing staff who  said the "emesis" was in the trash can and appeared mostly to be mucus and "I did not see any blood in there." No other upper or lower GI complaints.  Denies significant dysphagia stating "everything I'm eating is soft or liquid so there's no way to have difficulty swallowing that stuff."  Past Medical History:  Diagnosis Date  . Diverticulitis large intestine w/o perforation or abscess w/o bleeding 10/05/2014  . Diverticulosis   . GERD (gastroesophageal reflux disease)   . Helicobacter pylori gastritis 09/06/2014  . Kidney stones     Past Surgical History:  Procedure Laterality Date  . COLONOSCOPY N/A 06/10/2014   Dr. Oneida Alar; redundant sigmoid colon, moderate diverticulosis in the sigmoid and descending colon. small internal hemorrhoids. Next screening at age 65.   Marland Kitchen ESOPHAGOGASTRODUODENOSCOPY N/A 06/10/2014   Dr. Oneida Alar: H.pylori gastritis s/p treatment with Amoxicillin and Biaxin  . KNEE SURGERY Right   . renal calculi removal Left 2010  . SKIN LESION EXCISION     over right eyebrow due to wax being left above eye and it seeped down into pore    Prior to Admission medications   Medication Sig Start Date End Date Taking? Authorizing Provider  famotidine (PEPCID) 10 MG tablet Take 10 mg by mouth 2 (two) times daily.   Yes [provider]  ibuprofen (ADVIL,MOTRIN) 200 MG tablet Take 200-1,000 mg by mouth 2 (two) times daily as needed for mild pain or moderate pain.   Yes [provider]  levocetirizine (XYZAL) 5 MG tablet Take 5 mg by mouth every evening.   Yes [provider]  Multiple Vitamins-Minerals (CENTRUM ADULTS PO) Take 1 tablet by mouth daily.  Yes [provider]  norgestimate-ethinyl estradiol ([place] 28) 0.25-35 MG-MCG tablet Take 1 tablet by mouth daily.   Yes [provider]  omeprazole (PRILOSEC) 20 MG capsule 1 PO 30 MINS PRIOR TO BREAKFAST. 04/24/16  Yes Modelle Vollmer L, MD  promethazine (PHENERGAN) 25 MG tablet Take 25 mg  by mouth every 6 (six) hours as needed for nausea or vomiting.   Yes [provider]  HYDROcodone-acetaminophen (NORCO/VICODIN) 5-325 MG tablet Take 1 tablet by mouth every 6 (six) hours as needed for severe pain. Patient not taking: Reported on 07/31/2016 04/24/16   Danie Binder, MD    Current Facility-Administered Medications  Medication Dose Route Frequency Provider Last Rate Last Dose  . acetaminophen (TYLENOL) tablet 650 mg  650 mg Oral Q6H PRN Orvan Falconer, MD       Or  . acetaminophen (TYLENOL) suppository 650 mg  650 mg Rectal Q6H PRN Orvan Falconer, MD      . ciprofloxacin (CIPRO) IVPB 400 mg  400 mg Intravenous Q12H Orvan Falconer, MD   Stopped at 08/02/16 1048  . dextrose 5 %-0.9 % sodium chloride infusion   Intravenous Continuous Orvan Falconer, MD      . heparin injection 7,500 Units  7,500 Units Subcutaneous Q8H Orvan Falconer, MD   7,500 Units at 08/02/16 0601  . HYDROmorphone (DILAUDID) injection 1 mg  1 mg Intravenous Q4H PRN Orvan Falconer, MD   1 mg at 08/02/16 1003  . metroNIDAZOLE (FLAGYL) IVPB 500 mg  500 mg Intravenous Q8H Schorr, Rhetta Mura, NP   Stopped at 08/02/16 [phone]  . norgestimate-ethinyl estradiol (ORTHO-CYCLEN,SPRINTEC,PREVIFEM) 0.25-35 MG-MCG tablet 1 tablet  1 tablet Oral Daily Orvan Falconer, MD   1 tablet at 08/02/16 1000  . ondansetron (ZOFRAN) tablet 4 mg  4 mg Oral Q6H PRN Orvan Falconer, MD   4 mg at 08/02/16 1194   Or  . ondansetron [facility]) injection 4 mg  4 mg Intravenous Q6H PRN Orvan Falconer, MD   4 mg at 08/01/16 1605  . pantoprazole (PROTONIX) EC tablet 40 mg  40 mg Oral Q0600 Orvan Falconer, MD   40 mg at 08/02/16 0602  . sodium chloride (OCEAN) 0.65 % nasal spray 1 spray  1 spray Each Nare PRN Orvan Falconer, MD        Allergies as of 07/31/2016 - Review Complete 07/31/2016  Allergen Reaction Noted  . Coconut fatty acids  07/31/2016    Family History  Problem Relation Age of Onset  . Heart failure Mother   . Cancer Other        mother's side of family, breast cancer  . Cancer  Other        father's side of family, leukemia  . Hypertension Father   . Diabetes Other   . Hypertension Other   . Colon cancer Neg Hx     Social History   Social History  . Marital status: Married    Spouse name: N/A  . Number of children: 0  . Years of education: N/A   Occupational History  . [employer], coordinator for resource side    Social History Main Topics  . Smoking status: Never Smoker  . Smokeless tobacco: Never Used  . Alcohol use No  . Drug use: No  . Sexual activity: Yes    Birth control/ protection: Pill   Other Topics Concern  . Not on file   Social History Narrative  . No narrative on file    Review of Systems: General: Negative  for anorexia, weight loss, fever, chills, fatigue, weakness. ENT: Negative for hoarseness, difficulty swallowing. CV: Negative for chest pain, angina, palpitations, peripheral edema.  Respiratory: Negative for dyspnea at rest, dyspnea on exertion, cough, sputum, wheezing.  GI: See history of present illness. Derm: Negative for rash or itching.  Endo: Negative for unusual weight change.  Heme: Negative for bruising or bleeding.  Physical Exam: Vital signs in last 24 hours: Temp:  [98.7 F (37.1 C)-98.9 F (37.2 C)] 98.9 F (37.2 C) (06/08 0731) Pulse Rate:  [68-82] 68 (06/08 0731) Resp:  [18] 18 (06/08 0731) BP: (128-154)/(67-76) 154/76 (06/08 0731) SpO2:  [96 %-97 %] 97 % (06/08 0731) Last BM Date: 07/30/16 General:   Morbidly Obese female. Alert,  Well-developed, well-nourished, pleasant and cooperative in NAD Head:  Normocephalic and atraumatic. Eyes:  Sclera clear, no icterus. Conjunctiva pink. Ears:  Normal auditory acuity. Mouth:  No deformity or lesions, dentition normal. A small piese of thick mucus was seen hanging from her uvula, no other abnormalities noted. Neck:  Supple; no masses or thyromegaly. Lungs:  Clear throughout to auscultation.  No wheezes, crackles, or rhonchi. No acute distress. Heart:   Regular rate and rhythm; no murmurs, clicks, rubs,  or gallops. Abdomen:  Obese but soft, nontender (epigastric area only palpated per patient request) and nondistended. No masses, hepatosplenomegaly or hernias noted. Normal bowel sounds, without guarding, and without rebound.   Rectal:  Deferred until time of colonoscopy.   Msk:  Symmetrical without gross deformities. Neurologic:  Alert and  oriented x4;  grossly normal neurologically. Psych:  Alert and cooperative. Normal mood and affect.  Intake/Output from previous day: 06/07 0701 - 06/08 0700 In: 780 [P.O.:480; IV Piggyback:300] Out: -  Intake/Output this shift: Total I/O In: 100 [IV Piggyback:100] Out: -   Lab Results:  Recent Labs  07/31/16 0710  WBC 10.5  HGB 10.8*  HCT 32.9*  PLT 200   BMET  Recent Labs  07/31/16 0815  NA 136  K 3.8  CL 105  CO2 25  GLUCOSE 108*  BUN 9  CREATININE 0.77  CALCIUM 8.3*   LFT  Recent Labs  07/31/16 0815  PROT 6.7  ALBUMIN 3.1*  AST 15  ALT 15  ALKPHOS 58  BILITOT 0.5   PT/INR No results for input(s): LABPROT, INR in the last 72 hours. Hepatitis Panel No results for input(s): HEPBSAG, HCVAB, HEPAIGM, HEPBIGM in the last 72 hours. C-Diff No results for input(s): CDIFFTOX in the last 72 hours.  Studies/Results: No results found.  Impression: Patient with complaints of sensation of "a piece of skin or tissue" in her throat that caused choking/coughing and still feels like it is there. No other upper GI complaints. No GERD symptoms, no dysphagia ("you can't have that with soft foods.") and continued diverticulitis pain. No obvious abnormality seen on exam. States she had significant vomiting with hematemesis but nurse said it appeared mostly mucus and no blood was seen. She could have a piece of peeling skin due to oral dryness and eating. I doubt foreign body or esophageal stricturing based on her presentation. No overt symptoms of worsening anemia.  Plan: 1. Upper  GI series/swallowing study to evaluate 2. Continued clear liquid diet after evaluation with upper GI 3. Continue to monitor 4. Follow for any worsening symptoms, melena, hematemesis 5. Supportive measure 6. Will discuss further with Dr. Oneida Alar.   Thank you for allowing [place] to participate in the care of Philomena Course, DNP, AGNP-C  Adult & Gerontological Nurse Practitioner [facility]    LOS: 2 days     08/02/2016, 11:31 AM

## 2016-08-02 NOTE — Care Management Note (Signed)
Case Management Note  Patient Details  Name: Marilyn Rivera MRN: [id number] Date of Birth: 1981-02-20  Subjective/Objective:                  Admitted with diverticulitis. Pt from home, ind with ADL. She has insurance but no PCP. She has transportation. Pt admits to no PCP but communicates no needs.   Action/Plan: Pt provided list of PCP's accepting new pt's. CM will follow to DC, no needs anticipated.   Expected Discharge Date:  08/03/16               Expected Discharge Plan:  Home/Self Care  In-House Referral:  NA  Discharge planning Services  CM Consult  Post Acute Care Choice:  NA Choice offered to:  NA  Status of Service:  Completed, signed off   Sherald Barge, RN 08/02/2016, 8:40 AM

## 2016-08-02 NOTE — Progress Notes (Signed)
Patient c/o partially detached tissue in the back of her throat. Patient states that this started yesterday morning, then there was blood and mucus coming from her throat and nose. Patient states that this caused her to feel like she was choking at that time. Patient states that now this "skin" is irritating and she cannot tolerate it. No respiratory distress noted at this time. Patient states that she can swallow liquids with no difficulty. Will pass on to next shift.

## 2016-08-03 MED ORDER — LIDOCAINE VISCOUS 2 % MT SOLN: 10.0000 mL | OROMUCOSAL | Status: DC | PRN

## 2016-08-03 MED ORDER — ONDANSETRON HCL 4 MG/2ML IJ SOLN
4.0000 mg | Freq: Three times a day (TID) | INTRAMUSCULAR | Status: DC
  Administered 2016-08-03 – 2016-08-04 (×5): 4 mg via INTRAVENOUS
  Filled 2016-08-03 (×5): qty 2

## 2016-08-03 MED ORDER — FLUCONAZOLE 100 MG PO TABS
200.0000 mg | ORAL_TABLET | Freq: Every day | ORAL | Status: DC
  Administered 2016-08-03 – 2016-08-04 (×2): 200 mg via ORAL
  Filled 2016-08-03 (×2): qty 2

## 2016-08-03 NOTE — Progress Notes (Addendum)
Patient ID: Marilyn Rivera, female   DOB: 1980/08/31, 36 y.o.   MRN: [id number]  Assessment/Plan: ADMITTED WITH ACUTE COMPLICATED DIVERTICULITIS. CLINICALLY IMPROVED. C/O NAUSEA BUT PAIN IMPROVED. MOST NAUSEA NERATED BY SWOLLEN AND INFLAMED UVULA. BPE: NORMAL  PLAN: 1. PAGED ENT TO DISCUSS UVULA: 4098. 1191: SPOKE WITH DR. BYERS RECOMMENDED TOPICAL TREATMENT(VISC LIDOCAINE, MMW), STEROIDS IF NO CONTRAINDICATIONS, CONTINUE ABX. PT NOT A CANDIDATE FOR STEROIDS DUE TO ONGOING DIVERTICULITIS. REASSESS DAILY. 2. ADD DIFLUCAN 200 MG DAILY. VISCOUS LIDOCAINE IF NEEDED. 3. PROTONIX DAILY. ZOFRAN QAC/HS 4. ADVANCE TO DYSPHAGIA 3 DIET 5. WILL CONTACT [facility] AND LET THEM KNOW PT ADMITTED WITH DIVERTICULITIS.    Subjective: Since I last evaluated the patient TUES TOOK IBUPROFEN 80-0 MG x1 THEN 4-6 HRS LATER 600 MG @2P  AND THEN CAME TO ED WED AM.  PAIN BETTER. NAUSEA PERSITS. VOMITING x 2. STILL ON CLEAR LIQUID DIET.  Objective: Vital signs in last 24 hours: Vitals:   08/02/16 2155 08/03/16 0627  BP: 133/65 119/60  Pulse: 97 84  Resp: 20 16  Temp: 98.8 F (37.1 C) 98.3 F (36.8 C)   General appearance: alert, cooperative and no distress Resp: clear to auscultation bilaterally Cardio: regular rate and rhythm GI: soft, MILD TO MODERATE TENDERNESS IN LLQ, NO REBOUND OR GUARDINGtender; bowel sounds normal; NEURO: NO FOCAL DEFICITS  Lab Results:  JUN 6: UA-TNTC RBC, WBC 0-5, CT ABD/PELVIS: DIVERTICULITIS WITH POSSIBLE INCOMPLETE COLOVESICAL FISTULA   Studies/Results: Dg Esophagus  Result Date: 08/02/2016 CLINICAL DATA:  Sensation at something is stuck in the back of her throat, coughing and choking, vomiting, history of GERD, H pylori infection, diverticulitis EXAM: ESOPHOGRAM / BARIUM SWALLOW / BARIUM TABLET STUDY TECHNIQUE: Combined double contrast and single contrast examination performed using effervescent crystals, thick barium liquid, and thin barium liquid. The patient was observed with  fluoroscopy swallowing a 13 mm barium sulphate tablet. Degradation of image quality secondary to body habitus. FLUOROSCOPY TIME:  Fluoroscopy Time:  1 minutes 24 seconds Radiation Exposure Index (if provided by the fluoroscopic device): 42.1 mGy Number of Acquired Spot Images: multiple fluoroscopic screen captures COMPARISON:  None FINDINGS: Esophageal distention: Normal without mass or stricture Filling defects:  None 12.5 mm barium tablet: Passed from oral cavity to stomach without delay Motility:  Normal Mucosa:  Smooth without definite irregularity or ulceration Hypopharynx/cervical esophagus: Normal motion without laryngeal penetration or aspiration. No residuals or Zenker diverticulum. Hiatal hernia:  Absent GE reflux:  Not identified during the exam Other:  N/A IMPRESSION: Normal exam. Electronically Signed   By: Lavonia Dana M.D.   On: 08/02/2016 15:15    Medications: I have reviewed the patient's current medications.   LOS: 5 days   Barney Drain 08/05/2013, 2:23 PM

## 2016-08-03 NOTE — Progress Notes (Signed)
PROGRESS NOTE    Marilyn Rivera  MEQ:[id number] DOB: Oct 28, 1980 DOA: 07/31/2016 PCP: Patient, No Pcp Per    Brief Narrative:   Marilyn Pott Fosteris an 36 y.o.femalewith hx of recurrent diverticulitis (greater than 5 times per husband), having had seen surgeon at [facility] and planned for up coming surgery, hx of morbid obesity, GERD, and Helicobacter pylori, presented to the ER with one day hx of LLQ pain. She has no fever, chills, black or blood stool. Work up in the ER included a normal WBC, normal renal fxt tests, and Hb of 10.8 gr per dL. CT of the abdomen confirmed diverticulitis, no evidence of perforation, and queried early fistula formation to the bladder. Her UA is negative. She was given CIpro and Flagyl, and pain is better. She has some coughs with blood tingued sputum. She continue to feel dysphagia. She was seen by GI, recommended UGI and it was normal.  Dr Eden Lathe started her on Diflucan.   Assessment & Plan:   Active Problems:   Morbid obesity ([place])   Diverticulitis large intestine w/o perforation or abscess w/o bleeding   GERD (gastroesophageal reflux disease)   Acute diverticulitis   Right lower quadrant abdominal pain   Dysphagia   Hematemesis 1. Acute diverticulitis:  She is doing better, having less pain.  Continue with IV Cipro and Flagyl.  Diet advanced as per GI. 2. Dysphagia:  Exam is normal.  Will give IVF, make NPO. UGI negative. ? Candida infection.  She was given Diflucan. 3. Obesity:  Will need polysomnogram.   DVT prophylaxis:  Hep SQ. Code Status: FULL CODE. Family Communication: husband at bedside.  Disposition Plan: Home..  Consultants:   GI.  Procedures:   None.   Antimicrobials: Anti-infectives    Start     Dose/Rate Route Frequency Ordered Stop   08/03/16 1245  fluconazole (DIFLUCAN) tablet 200 mg     200 mg Oral Daily 08/03/16 1236 08/10/16 0959   07/31/16 2200  metroNIDAZOLE (FLAGYL) IVPB 500 mg     500 mg 100 mL/hr over 60 Minutes  Intravenous Every 8 hours 07/31/16 1935     07/31/16 2200  ciprofloxacin (CIPRO) IVPB 400 mg     400 mg 200 mL/hr over 60 Minutes Intravenous Every 12 hours 07/31/16 2006     07/31/16 1115  ciprofloxacin (CIPRO) IVPB 400 mg     400 mg 200 mL/hr over 60 Minutes Intravenous  Once 07/31/16 1103 07/31/16 1245   07/31/16 1115  metroNIDAZOLE (FLAGYL) IVPB 500 mg     500 mg 100 mL/hr over 60 Minutes Intravenous  Once 07/31/16 1103 07/31/16 1316       Subjective:  Still having dysphagia.  Abdominal pain is better.    Objective: Vitals:   08/01/16 2000 08/02/16 0731 08/02/16 2155 08/03/16 0627  BP: 128/67 (!) 154/76 133/65 119/60  Pulse: 82 68 97 84  Resp: 18 18 20 16   Temp: 98.7 F (37.1 C) 98.9 F (37.2 C) 98.8 F (37.1 C) 98.3 F (36.8 C)  TempSrc: Oral Oral Oral   SpO2: 96% 97% 95% 98%  Weight:      Height:        Intake/Output Summary (Last 24 hours) at 08/03/16 1542 Last data filed at 08/03/16 1200  Gross per 24 hour  Intake          2580.83 ml  Output                0 ml  Net  2580.83 ml   Filed Weights   07/31/16 0701 07/31/16 1653  Weight: (!) 180.5 kg (398 lb) (!) 180 kg (396 lb 14.4 oz)    Examination:  General exam: Appears calm and comfortable  Respiratory system: Clear to auscultation. Respiratory effort normal. Cardiovascular system: S1 & S2 heard, RRR. No JVD, murmurs, rubs, gallops or clicks. No pedal edema. Gastrointestinal system: Abdomen is nondistended, soft and nontender. No organomegaly or masses felt. Normal bowel sounds heard. Central nervous system: Alert and oriented. No focal neurological deficits. Extremities: Symmetric 5 x 5 power. Skin: No rashes, lesions or ulcers Psychiatry: Judgement and insight appear normal. Mood & affect appropriate.   Data Reviewed: I have personally reviewed following labs and imaging studies  CBC:  Recent Labs Lab 07/31/16 0710  WBC 10.5  NEUTROABS 6.6  HGB 10.8*  HCT 32.9*  MCV 91.6  PLT 200     Basic Metabolic Panel:  Recent Labs Lab 07/31/16 0815  NA 136  K 3.8  CL 105  CO2 25  GLUCOSE 108*  BUN 9  CREATININE 0.77  CALCIUM 8.3*   GFR: Estimated Creatinine Clearance: 171.4 mL/min (by C-G formula based on SCr of 0.77 mg/dL). Liver Function Tests:  Recent Labs Lab 07/31/16 0815  AST 15  ALT 15  ALKPHOS 58  BILITOT 0.5  PROT 6.7  ALBUMIN 3.1*     Radiology Studies: Dg Esophagus  Result Date: 08/02/2016 CLINICAL DATA:  Sensation at something is stuck in the back of her throat, coughing and choking, vomiting, history of GERD, H pylori infection, diverticulitis EXAM: ESOPHOGRAM / BARIUM SWALLOW / BARIUM TABLET STUDY TECHNIQUE: Combined double contrast and single contrast examination performed using effervescent crystals, thick barium liquid, and thin barium liquid. The patient was observed with fluoroscopy swallowing a 13 mm barium sulphate tablet. Degradation of image quality secondary to body habitus. FLUOROSCOPY TIME:  Fluoroscopy Time:  1 minutes 24 seconds Radiation Exposure Index (if provided by the fluoroscopic device): 42.1 mGy Number of Acquired Spot Images: multiple fluoroscopic screen captures COMPARISON:  None FINDINGS: Esophageal distention: Normal without mass or stricture Filling defects:  None 12.5 mm barium tablet: Passed from oral cavity to stomach without delay Motility:  Normal Mucosa:  Smooth without definite irregularity or ulceration Hypopharynx/cervical esophagus: Normal motion without laryngeal penetration or aspiration. No residuals or Zenker diverticulum. Hiatal hernia:  Absent GE reflux:  Not identified during the exam Other:  N/A IMPRESSION: Normal exam. Electronically Signed   By: Lavonia Dana M.D.   On: 08/02/2016 15:15    Scheduled Meds: . fluconazole  200 mg Oral Daily  . heparin  7,500 Units Subcutaneous Q8H  . norgestimate-ethinyl estradiol  1 tablet Oral Daily  . ondansetron (ZOFRAN) IV  4 mg Intravenous TID WC & HS  . pantoprazole   40 mg Oral Q0600   Continuous Infusions: . ciprofloxacin Stopped (08/03/16 1343)  . dextrose 5 % and 0.9% NaCl 50 mL/hr at 08/02/16 1500  . metronidazole 500 mg (08/03/16 1342)     LOS: 3 days   Kristapher [place], MD FACP Hospitalist.   If 7PM-7AM, please contact night-coverage [email or web address] Password [email or web address] 08/03/2016, 3:42 PM

## 2016-08-04 DIAGNOSIS — R1312 Dysphagia, oropharyngeal phase: Secondary | ICD-10-CM

## 2016-08-04 MED ORDER — AMOXICILLIN-POT CLAVULANATE 875-125 MG PO TABS: 1.0000 | ORAL_TABLET | Freq: Two times a day (BID) | ORAL | 0 refills | Status: DC

## 2016-08-04 MED ORDER — AMOXICILLIN-POT CLAVULANATE 875-125 MG PO TABS
1.0000 | ORAL_TABLET | Freq: Two times a day (BID) | ORAL | Status: DC
  Administered 2016-08-04: 1 via ORAL
  Filled 2016-08-04: qty 1

## 2016-08-04 MED ORDER — FLUCONAZOLE 200 MG PO TABS: 200.0000 mg | ORAL_TABLET | Freq: Every day | ORAL | 0 refills | Status: DC

## 2016-08-04 NOTE — Progress Notes (Signed)
Patient ID: Marilyn Rivera, female   DOB: 1980/08/12, 36 y.o.   MRN: [id number]   Assessment/Plan: ADMITTED WITH ACUTE ? COMPLICATED DIVERTICULITIS. CLINICALLY IMPROVED. NAUSEA/VOMITING IMPROVED. UVULA LESS IRRITATED TODAY.  PLAN: 1. OK TO D/C TODAY. DIFLUCAN FOR 7 DAYS. PROMETHAZINE IF NEEDED FOR NAUSEA. 2. COMPLETE 10 DAYS ABX. CHANGE TO AUGMENTIN 500 MG BID. FLAGYL CAUSED NAUSEA. 3. CONTINUE OMEPRAZOLE.  TAKE 30 MINUTES PRIOR TO FRIST MEAL. PEPCID IF NEEDED FOR HEARTBURN. 4. FOLLOW UP WITH [name] FOR SURGERY. 5. OPV IN 3 MOS WITH DR. Briona Rivera.   Subjective: Since I last evaluated the patient HER UVULA FEELS BETTER. NAUSEA PERSISTS IN SPITE OF ZOFRAN ATC. CONTINUES ON CIP/FLAGYL. ABDOMINAL PAIN IS BETTER.   Objective: Vital signs in last 24 hours: Vitals:   08/03/16 2204 08/04/16 0555  BP: (!) 129/52 121/61  Pulse: 83 82  Resp: 16 16  Temp: 99.1 F (37.3 C) 98.6 F (37 C)   General appearance: alert, cooperative and no distress Resp: clear to auscultation bilaterally Cardio: regular rate and rhythm GI: soft, non-tender; bowel sounds normal;    Lab Results: NONE   Studies/Results: No results found.  Medications: I have reviewed the patient's current medications.   LOS: 5 days   Barney Drain 08/05/2013, 2:23 PM

## 2016-08-04 NOTE — Discharge Summary (Signed)
Physician Discharge Summary  Marilyn Rivera WCH:[id number] DOB: December 25, 1980 DOA: 07/31/2016  PCP: Patient, No Pcp Per  Admit date: 07/31/2016 Discharge date: 08/04/2016  Admitted From: Home. Disposition:  Home.   Recommendations for Outpatient Follow-up:  1. Follow up with PCP in 1-2 weeks 2. Follow up with Dr Oneida Alar as scheduled 3. Follow up with Her surgeon at [facility] as planned.   Home Health: None.  Equipment/Devices: None.  Discharge Condition: improved.  No abdominal pain, mild nausea, no vomiting, able to eat.  Dysphagia is better.  CODE STATUS: FULL CODE.  Diet recommendation: as tolerated.  High fiber diet.   Brief/Interim Summary:  Patient was admitted by me on July 31, 2016 for acute diverticulitis.   As per my H and P:  " Marilyn Rivera is an 36 y.o. female with hx of recurrent diverticulitis (greater than 5 times per husband), having had seen surgeon at [facility] and planned for up coming surgery, hx of morbid obesity, GERD, and Helicobacter pylori, presented to the ER with one day hx of LLQ pain.  She has no fever, chills, black or blood stool.  Work up in the ER included a normal WBC, normal renal fxt tests, and Hb of 10.8 gr per dL.  CT of the abdomen confirmed diverticulitis, no evidence of perforation, and queried early fistula formation to the bladder.  Her UA is negative.  She was started on IV Cipro and IV Flagyl, consulted surgery, and hospitalist was asked to admit her for further evaluation.    HOSPITAL COURSE:  She was admitted, given IVF, and IV Cipro and Flagyl along with narcotics for her pain.  Given her stable course, and the fact that she has seen a surgeon at [facility] to have elective surgery, no surgical consultation was requested.  Her pain improved, and she was advanced on her diet.  She improved with respect to her diverticulitis, but started to have dysphagia, bordering on odynophagia.  She was again made NPO, and GI was consulted.  Dr Oneida Alar, who is her primary  gastroenterologist, saw her, and requested an UGI which was normal.  She spoke with ENT, and treated her symptomatically.  Suspicious of candida infection, she was started on Diflucan, and she said it has improved.  She did have significant nasal congestion, and at one time had some bloody nasal discharge, but it resolved promptly with nasal saline spray.  She is anxious to go home, and is stable for discharge.  Due to her nausea, her Cipro and Flagyl, at the recommendation of GI, will be changed to Augmentin for another 10 days.  Her diflucan will be continued for another 10 days, along with Omeprazole.  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  She will see her PCP next week, and with Dr Oneida Alar as recommended.  She should see ENT if her symptom of dysphagia persists.  Thank you for allowing me to participate in her care.  Good Day.   Discharge Diagnoses:  Active Problems:   Morbid obesity ([place])   Diverticulitis large intestine w/o perforation or abscess w/o bleeding   GERD (gastroesophageal reflux disease)   Acute diverticulitis   Right lower quadrant abdominal pain   Dysphagia   Hematemesis    Discharge Instructions  Discharge Instructions    Diet - low sodium heart healthy    Complete by:  As directed  Discharge instructions    Complete by:  As directed    Find a PCP for general medical follow up.  See Dr Oneida Alar as per her recommendation.  Finish your medication and follow up with your surgeon at [facility].   Increase activity slowly    Complete by:  As directed      Allergies as of 08/04/2016      Reactions   Coconut Fatty Acids       Medication List    STOP taking these medications   famotidine 10 MG tablet Commonly known as:  PEPCID   ibuprofen 200 MG tablet Commonly known as:  ADVIL,MOTRIN   promethazine 25 MG  tablet Commonly known as:  PHENERGAN     TAKE these medications   amoxicillin-clavulanate 875-125 MG tablet Commonly known as:  AUGMENTIN Take 1 tablet by mouth every 12 (twelve) hours.   CENTRUM ADULTS PO Take 1 tablet by mouth daily.   fluconazole 200 MG tablet Commonly known as:  DIFLUCAN Take 1 tablet (200 mg total) by mouth daily. Start taking on:  08/05/2016   HYDROcodone-acetaminophen 5-325 MG tablet Commonly known as:  NORCO/VICODIN Take 1 tablet by mouth every 6 (six) hours as needed for severe pain.   levocetirizine 5 MG tablet Commonly known as:  XYZAL Take 5 mg by mouth every evening.   omeprazole 20 MG capsule Commonly known as:  PRILOSEC 1 PO 30 MINS PRIOR TO BREAKFAST.   SPRINTEC 28 0.25-35 MG-MCG tablet Generic drug:  norgestimate-ethinyl estradiol Take 1 tablet by mouth daily.       Allergies  Allergen Reactions  . Coconut Fatty Acids     Consultations:  GI.    Procedures/Studies: Ct Abdomen Pelvis W Contrast  Result Date: 07/31/2016 CLINICAL DATA:  36 year old female with history of left lower abdominal pain for 1 day. History of diverticulitis. EXAM: CT ABDOMEN AND PELVIS WITH CONTRAST TECHNIQUE: Multidetector CT imaging of the abdomen and pelvis was performed using the standard protocol following bolus administration of intravenous contrast. CONTRAST:  162mL ISOVUE-300 IOPAMIDOL (ISOVUE-300) INJECTION 61% COMPARISON:  CT the abdomen and pelvis 04/19/2016. FINDINGS: Lower chest: Dependent bibasilar opacities in the lower lobes of the lungs bilaterally, favored to reflect areas of atelectasis, although some degree of pneumonitis could be present if there has been recent history of vomiting and concern for aspiration. Hepatobiliary: No suspicious cystic or solid hepatic lesions. No intra or extrahepatic biliary ductal dilatation. Gallbladder is normal in appearance. Pancreas: No pancreatic mass. No pancreatic ductal dilatation. No pancreatic or  peripancreatic fluid or inflammatory changes. Spleen: Unremarkable. Adrenals/Urinary Tract: Bilateral kidneys and bilateral adrenal glands are normal in appearance. No hydroureteronephrosis. Left side of the urinary bladder is markedly thickened is immediately adjacent to the area of acute diverticular inflammation (see below). No gas within the lumen of the urinary bladder at this time. Stomach/Bowel: Normal appearance of the stomach. No pathologic dilatation of small bowel or colon. Numerous colonic diverticulae are noted. In the region of the proximal sigmoid colon there is focal wall thickening and extensive surrounding inflammatory changes associated with some diverticulae, compatible with acute diverticulitis. Notably, this is intimately associated with the left lateral wall of the urinary bladder which is markedly thickened (best appreciated on axial image 79 of series 2 and coronal image 51 of series 5), the appearance of which is concerning for direct involvement of the urinary bladder or with this infectious/inflammatory process. At this time, there is no definite colovesical fistula (i.e., no definite gas in the  lumen of the urinary bladder). Appendix is normal in appearance. There is a small amount of periappendiceal free fluid. Vascular/Lymphatic: Aortic atherosclerosis (mild), without evidence of aneurysm or dissection in the abdominal or pelvic vasculature. No lymphadenopathy noted in the abdomen or pelvis. Reproductive: Uterus and ovaries are unremarkable in appearance. Other: Trace volume of free fluid in the low anatomic pelvis could be reactive, or could be physiologic. No larger volume of ascites. No pneumoperitoneum. Musculoskeletal: There are no aggressive appearing lytic or blastic lesions noted in the visualized portions of the skeleton. IMPRESSION: 1. Acute diverticulitis of the proximal sigmoid colon. Notably, the inflamed diverticulum is intimately associated with the left lateral wall of  the urinary bladder. The appearance of this area is concerning for potential developing colovesical fistula, although no frank fistula is confidently identified at this time. Surgical consultation is recommended. 2. Small volume of free fluid in the low anatomic pelvis could be reactive or physiologic in this young female patient. There is also a trace amount of periappendiceal free fluid. This is nonspecific. The appendix itself appears normal. 3. Aortic atherosclerosis. 4. Dependent opacities in the lower lobes of the lungs bilaterally, likely to reflect areas of subsegmental atelectasis. If there has been a recent history of emesis, clinical correlation for signs and symptoms of aspiration pneumonitis is suggested. Electronically Signed   By: Vinnie Langton M.D.   On: 07/31/2016 10:28   Dg Esophagus  Result Date: 08/02/2016 CLINICAL DATA:  Sensation at something is stuck in the back of her throat, coughing and choking, vomiting, history of GERD, H pylori infection, diverticulitis EXAM: ESOPHOGRAM / BARIUM SWALLOW / BARIUM TABLET STUDY TECHNIQUE: Combined double contrast and single contrast examination performed using effervescent crystals, thick barium liquid, and thin barium liquid. The patient was observed with fluoroscopy swallowing a 13 mm barium sulphate tablet. Degradation of image quality secondary to body habitus. FLUOROSCOPY TIME:  Fluoroscopy Time:  1 minutes 24 seconds Radiation Exposure Index (if provided by the fluoroscopic device): 42.1 mGy Number of Acquired Spot Images: multiple fluoroscopic screen captures COMPARISON:  None FINDINGS: Esophageal distention: Normal without mass or stricture Filling defects:  None 12.5 mm barium tablet: Passed from oral cavity to stomach without delay Motility:  Normal Mucosa:  Smooth without definite irregularity or ulceration Hypopharynx/cervical esophagus: Normal motion without laryngeal penetration or aspiration. No residuals or Zenker diverticulum. Hiatal  hernia:  Absent GE reflux:  Not identified during the exam Other:  N/A IMPRESSION: Normal exam. Electronically Signed   By: Lavonia Dana M.D.   On: 08/02/2016 15:15      Subjective: swallowing is better.  No pain.    Discharge Exam: Vitals:   08/03/16 2204 08/04/16 0555  BP: (!) 129/52 121/61  Pulse: 83 82  Resp: 16 16  Temp: 99.1 F (37.3 C) 98.6 F (37 C)   Vitals:   08/03/16 0627 08/03/16 1543 08/03/16 2204 08/04/16 0555  BP: 119/60 (!) 134/53 (!) 129/52 121/61  Pulse: 84 86 83 82  Resp: 16 16 16 16   Temp: 98.3 F (36.8 C) 98.7 F (37.1 C) 99.1 F (37.3 C) 98.6 F (37 C)  TempSrc:  Oral Oral Oral  SpO2: 98% 94% 98% 98%  Weight:      Height:        General: Pt is alert, awake, not in acute distress Cardiovascular: RRR, S1/S2 +, no rubs, no gallops Respiratory: CTA bilaterally, no wheezing, no rhonchi Abdominal: Soft, NT, ND, bowel sounds + Extremities: no edema, no cyanosis  The results of significant diagnostics from this hospitalization (including imaging, microbiology, ancillary and laboratory) are listed below for reference.     Microbiology: No results found for this or any previous visit (from the past 240 hour(s)).   Labs: BNP (last 3 results) No results for input(s): BNP in the last 8760 hours. Basic Metabolic Panel:  Recent Labs Lab 07/31/16 0815  NA 136  K 3.8  CL 105  CO2 25  GLUCOSE 108*  BUN 9  CREATININE 0.77  CALCIUM 8.3*   Liver Function Tests:  Recent Labs Lab 07/31/16 0815  AST 15  ALT 15  ALKPHOS 58  BILITOT 0.5  PROT 6.7  ALBUMIN 3.1*   CBC:  Recent Labs Lab 07/31/16 0710  WBC 10.5  NEUTROABS 6.6  HGB 10.8*  HCT 32.9*  MCV 91.6  PLT 200   Urinalysis    Component Value Date/Time   COLORURINE YELLOW 07/31/2016 0704   APPEARANCEUR CLEAR 07/31/2016 0704   LABSPEC 1.014 07/31/2016 0704   PHURINE 6.0 07/31/2016 0704   GLUCOSEU NEGATIVE 07/31/2016 0704   HGBUR LARGE (A) 07/31/2016 0704   BILIRUBINUR  NEGATIVE 07/31/2016 0704   KETONESUR NEGATIVE 07/31/2016 0704   PROTEINUR NEGATIVE 07/31/2016 0704   UROBILINOGEN 0.2 10/05/2014 0028   NITRITE NEGATIVE 07/31/2016 0704   LEUKOCYTESUR NEGATIVE 07/31/2016 0704    Time coordinating discharge: Over 30 minutes SIGNED:  Orvan Falconer, MD FACP [facility] 08/04/2016, 1:39 PM   If 7PM-7AM, please contact night-coverage [email or web address] Password [email or web address]

## 2016-08-05 ENCOUNTER — Other Ambulatory Visit: Payer: Self-pay | Admitting: Gastroenterology

## 2016-08-06 ENCOUNTER — Encounter (HOSPITAL_COMMUNITY): Payer: Self-pay

## 2016-08-06 ENCOUNTER — Emergency Department (HOSPITAL_COMMUNITY)
Admission: EM | Admit: 2016-08-06 | Discharge: 2016-08-06 | Disposition: A | Discharge status: Home / Self Care | Payer: No Typology Code available for payment source | Attending: Emergency Medicine | Admitting: Emergency Medicine

## 2016-08-06 DIAGNOSIS — Z79899 Other long term (current) drug therapy: Secondary | ICD-10-CM | POA: Insufficient documentation

## 2016-08-06 DIAGNOSIS — K5732 Diverticulitis of large intestine without perforation or abscess without bleeding: Secondary | ICD-10-CM | POA: Diagnosis not present

## 2016-08-06 DIAGNOSIS — K5792 Diverticulitis of intestine, part unspecified, without perforation or abscess without bleeding: Secondary | ICD-10-CM

## 2016-08-06 DIAGNOSIS — R1032 Left lower quadrant pain: Secondary | ICD-10-CM | POA: Diagnosis present

## 2016-08-06 LAB — CBC WITH DIFFERENTIAL/PLATELET
Basophils Absolute: 0.1 10*3/uL (ref 0.0–0.1)
Basophils Relative: 0 %
Eosinophils Absolute: 0.3 10*3/uL (ref 0.0–0.7)
Eosinophils Relative: 2 %
HCT: 34.6 % — ABNORMAL LOW (ref 36.0–46.0)
Hemoglobin: 11.1 g/dL — ABNORMAL LOW (ref 12.0–15.0)
Lymphocytes Relative: 35 %
Lymphs Abs: 4.2 10*3/uL — ABNORMAL HIGH (ref 0.7–4.0)
MCH: 29.5 pg (ref 26.0–34.0)
MCHC: 32.1 g/dL (ref 30.0–36.0)
MCV: 92 fL (ref 78.0–100.0)
Monocytes Absolute: 1 10*3/uL (ref 0.1–1.0)
Monocytes Relative: 8 %
Neutro Abs: 6.7 10*3/uL (ref 1.7–7.7)
Neutrophils Relative %: 55 %
Platelets: 266 10*3/uL (ref 150–400)
RBC: 3.76 MIL/uL — ABNORMAL LOW (ref 3.87–5.11)
RDW: 15.4 % (ref 11.5–15.5)
WBC: 12.2 10*3/uL — ABNORMAL HIGH (ref 4.0–10.5)

## 2016-08-06 LAB — BASIC METABOLIC PANEL
Anion gap: 8 (ref 5–15)
BUN: 11 mg/dL (ref 6–20)
CO2: 26 mmol/L (ref 22–32)
Calcium: 8.5 mg/dL — ABNORMAL LOW (ref 8.9–10.3)
Chloride: 105 mmol/L (ref 101–111)
Creatinine, Ser: 0.89 mg/dL (ref 0.44–1.00)
GFR calc Af Amer: >60 mL/min (ref >60)
GFR calc non Af Amer: >60 mL/min (ref >60)
Glucose, Bld: 122 mg/dL — ABNORMAL HIGH (ref 65–99)
Potassium: 4.1 mmol/L (ref 3.5–5.1)
Sodium: 139 mmol/L (ref 135–145)

## 2016-08-06 LAB — URINALYSIS, ROUTINE W REFLEX MICROSCOPIC
Bacteria, UA: NONE SEEN
Bilirubin Urine: NEGATIVE
Glucose, UA: NEGATIVE mg/dL
Ketones, ur: NEGATIVE mg/dL
Nitrite: NEGATIVE
Protein, ur: 30 mg/dL — AB
Specific Gravity, Urine: 1.019 (ref 1.005–1.030)
pH: 5 (ref 5.0–8.0)

## 2016-08-06 LAB — LACTIC ACID, PLASMA: Lactic Acid, Venous: 1.5 mmol/L (ref 0.5–1.9)

## 2016-08-06 MED ORDER — HYDROMORPHONE HCL 1 MG/ML IJ SOLN
1.0000 mg | Freq: Once | INTRAMUSCULAR | Status: AC
  Administered 2016-08-06: 1 mg via INTRAVENOUS
  Filled 2016-08-06: qty 1

## 2016-08-06 MED ORDER — OXYCODONE-ACETAMINOPHEN 5-325 MG PO TABS: 1.0000 | ORAL_TABLET | ORAL | 0 refills | Status: DC | PRN

## 2016-08-06 MED ORDER — MORPHINE SULFATE (PF) 4 MG/ML IV SOLN
4.0000 mg | Freq: Once | INTRAVENOUS | Status: AC
  Administered 2016-08-06: 4 mg via INTRAVENOUS
  Filled 2016-08-06: qty 1

## 2016-08-06 MED ORDER — PROMETHAZINE HCL 25 MG/ML IJ SOLN
12.5000 mg | Freq: Once | INTRAMUSCULAR | Status: AC
  Administered 2016-08-06: 12.5 mg via INTRAVENOUS
  Filled 2016-08-06: qty 1

## 2016-08-06 MED ORDER — KETOROLAC TROMETHAMINE 30 MG/ML IJ SOLN
15.0000 mg | Freq: Once | INTRAMUSCULAR | Status: AC
  Administered 2016-08-06: 15 mg via INTRAVENOUS
  Filled 2016-08-06: qty 1

## 2016-08-06 MED ORDER — SODIUM CHLORIDE 0.9 % IV BOLUS (SEPSIS)
1000.0000 mL | Freq: Once | INTRAVENOUS | Status: AC
  Administered 2016-08-06: 1000 mL via INTRAVENOUS

## 2016-08-06 NOTE — Discharge Instructions (Signed)
Follow-up with your surgeon on Friday as previously scheduled.

## 2016-08-06 NOTE — ED Triage Notes (Signed)
Pt d/c as inpatient Sunday for diverticulitis. States symptoms started back last night with abd pain, nausea. Denies v/d.

## 2016-08-06 NOTE — ED Provider Notes (Signed)
[facility] DEPT Provider Note   CSN: [id number] Arrival date & time: 08/06/16  1659  By signing my name below, I, Levester Fresh, attest that this documentation has been prepared under the direction and in the presence of Virgel Manifold, MD . Electronically Signed: Levester Fresh, Scribe. 08/06/2016. 8:01 PM.  History   Chief Complaint Chief Complaint  Patient presents with  . Abdominal Pain   HPI Comments Marilyn Rivera is a 36 y.o. female with a PMHx significant for recurrent diverticulitis, morbid obesity, GERD, and Helicobacter pylori, who presents to the Emergency Department with complaints of sudden onset lower left quadrant abdominal pain x1 day.  Associated nausea, subjective fever.  No vomiting or diarrhea.  Pt was d/c from inpatient service for acute diverticulitis on 07/31/2016, x6 days ago, with scheduled outpatient surgical f/u.  She states that her sx had resolved, and returned x1 day ago, feeling similar to her typical diverticulitis flare.  Pt has experienced no symptomatic relief with ibuprofen.  She reports medication compliance with her antibiotics and antiemetics, but was not given a rx to manage her pain as an outpatient.  She states that the gastroenterologist that she follows with is out of town.  The history is provided by the patient and medical records. No language interpreter was used.    Past Medical History:  Diagnosis Date  . Diverticulitis large intestine w/o perforation or abscess w/o bleeding 10/05/2014  . Diverticulosis   . GERD (gastroesophageal reflux disease)   . Helicobacter pylori gastritis 09/06/2014  . Kidney stones     Patient Active Problem List   Diagnosis Date Noted  . Right lower quadrant abdominal pain   . Dysphagia   . Hematemesis   . Acute diverticulitis 07/31/2016  . GERD (gastroesophageal reflux disease) 04/24/2016  . Diverticulitis large intestine w/o perforation or abscess w/o bleeding 10/05/2014  . Helicobacter pylori  gastritis 09/06/2014  . Normocytic anemia   . Morbid obesity ([place]) 04/27/2014  . UTI (lower urinary tract infection) 04/27/2014    Past Surgical History:  Procedure Laterality Date  . COLONOSCOPY N/A 06/10/2014   Dr. Oneida Alar; redundant sigmoid colon, moderate diverticulosis in the sigmoid and descending colon. small internal hemorrhoids. Next screening at age 36.   Marland Kitchen ESOPHAGOGASTRODUODENOSCOPY N/A 06/10/2014   Dr. Oneida Alar: H.pylori gastritis s/p treatment with Amoxicillin and Biaxin  . KNEE SURGERY Right   . renal calculi removal Left 2010  . SKIN LESION EXCISION     over right eyebrow due to wax being left above eye and it seeped down into pore    OB History    No data available       Home Medications    Prior to Admission medications   Medication Sig Start Date End Date Taking? Authorizing Provider  amoxicillin-clavulanate (AUGMENTIN) 875-125 MG tablet Take 1 tablet by mouth every 12 (twelve) hours. 08/04/16  Yes Orvan Falconer, MD  fluconazole (DIFLUCAN) 200 MG tablet Take 1 tablet (200 mg total) by mouth daily. 08/05/16  Yes Orvan Falconer, MD  ibuprofen (ADVIL,MOTRIN) 200 MG tablet Take 200 mg by mouth every 6 (six) hours as needed for moderate pain.   Yes [provider]  levocetirizine (XYZAL) 5 MG tablet Take 5 mg by mouth every evening.   Yes [provider]  Multiple Vitamins-Minerals (CENTRUM ADULTS PO) Take 1 tablet by mouth daily.   Yes [provider]  norgestimate-ethinyl estradiol ([place] 28) 0.25-35 MG-MCG tablet Take 1 tablet by mouth daily.   Yes [provider]  omeprazole (PRILOSEC) 20 MG capsule 1 PO 30 MINS PRIOR TO BREAKFAST. Patient taking differently: Take 20 mg by mouth 2 (two) times daily before a meal. 1 PO 30 MINS PRIOR TO BREAKFAST. 04/24/16  Yes Fields, Sandi L, MD  HYDROcodone-acetaminophen (NORCO/VICODIN) 5-325 MG tablet Take 1 tablet by mouth every 6 (six) hours as needed for severe pain. Patient not taking: Reported on  07/31/2016 04/24/16   Danie Binder, MD    Family History Family History  Problem Relation Age of Onset  . Heart failure Mother   . Cancer Other        mother's side of family, breast cancer  . Cancer Other        father's side of family, leukemia  . Hypertension Father   . Diabetes Other   . Hypertension Other   . Colon cancer Neg Hx     Social History Social History  Substance Use Topics  . Smoking status: Never Smoker  . Smokeless tobacco: Never Used  . Alcohol use No     Allergies   Coconut fatty acids   Review of Systems Review of Systems  Constitutional: Positive for fever (Subjective).  Gastrointestinal: Positive for abdominal pain and nausea. Negative for diarrhea and vomiting.  All other systems reviewed and are negative.  Physical Exam Updated Vital Signs BP (!) 149/77 (BP Location: Right Wrist)   Pulse 64   Temp 98.6 F (37 C) (Oral)   Resp 18   LMP 07/09/2016 Comment: negative pregnancy test 2 days ago.  SpO2 98%   Physical Exam  Constitutional: She is oriented to person, place, and time. She appears well-developed and well-nourished.  Appears uncomfortable.  HENT:  Head: Normocephalic and atraumatic.  Eyes: EOM are normal.  Neck: Normal range of motion.  Cardiovascular: Normal rate, regular rhythm and normal heart sounds.   Pulmonary/Chest: Effort normal and breath sounds normal.  Abdominal: Soft.  Tender across lower abdomen.  Musculoskeletal: Normal range of motion.  Neurological: She is alert and oriented to person, place, and time.  Skin: Skin is warm and dry.  Psychiatric: She has a normal mood and affect. Judgment normal.  Nursing note and vitals reviewed.  ED Treatments / Results  DIAGNOSTIC STUDIES: Oxygen Saturation is 100% on room air, normal by my interpretation.    COORDINATION OF CARE: 6:32 PM Discussed treatment plan with pt at bedside and pt agreed to plan.  Labs (all labs ordered are listed, but only abnormal results  are displayed) Labs Reviewed  URINALYSIS, ROUTINE W REFLEX MICROSCOPIC - Abnormal; Notable for the following:       Result Value   Hgb urine dipstick LARGE (*)    Protein, ur 30 (*)    Leukocytes, UA TRACE (*)    Squamous Epithelial / LPF 0-5 (*)    All other components within normal limits  CBC WITH DIFFERENTIAL/PLATELET - Abnormal; Notable for the following:    WBC 12.2 (*)    RBC 3.76 (*)    Hemoglobin 11.1 (*)    HCT 34.6 (*)    Lymphs Abs 4.2 (*)    All other components within normal limits  BASIC METABOLIC PANEL - Abnormal; Notable for the following:    Glucose, Bld 122 (*)    Calcium 8.5 (*)    All other components within normal limits  LACTIC ACID, PLASMA    EKG  EKG Interpretation None       Radiology No results found.  Procedures Procedures (including critical care  time)  Medications Ordered in ED Medications  sodium chloride 0.9 % bolus 1,000 mL (1,000 mLs Intravenous New Bag/Given 08/06/16 1857)  HYDROmorphone (DILAUDID) injection 1 mg (1 mg Intravenous Given 08/06/16 1858)  promethazine (PHENERGAN) injection 12.5 mg (12.5 mg Intravenous Given 08/06/16 1858)  ketorolac (TORADOL) 30 MG/ML injection 15 mg (15 mg Intravenous Given 08/06/16 1858)  HYDROmorphone (DILAUDID) injection 1 mg (1 mg Intravenous Given 08/06/16 1950)     Initial Impression / Assessment and Plan / ED Course  I have reviewed the triage vital signs and the nursing notes.  Pertinent labs & imaging results that were available during my care of the patient were reviewed by me and considered in my medical decision making (see chart for details).     35yF with lower abdominal pain. Recent diagnosis of recurrent diverticulitis. Symptoms likely from same. Consider perf or abscess with symptoms worsening a little. Doubt though. No peritonitis. Afebrile. Nontoxic. Symptoms now improved. Continue abx. Has surgical FU planned.   Final Clinical Impressions(s) / ED Diagnoses   Final diagnoses:    Diverticulitis   I personally preformed the services scribed in my presence. The recorded information has been reviewed is accurate. Virgel Manifold, MD.   New Prescriptions New Prescriptions   No medications on file     Virgel Manifold, MD 08/10/16 [phone]

## 2016-08-06 NOTE — Telephone Encounter (Signed)
Patient called in stating she's having really bad abd pain after speaking with Vicente Males she stated she needed to go to the ER.  Patient stated she's headed to the ER now.  Routing to [employer]

## 2016-08-21 ENCOUNTER — Encounter: Payer: Self-pay | Admitting: Gastroenterology

## 2016-10-22 ENCOUNTER — Emergency Department (HOSPITAL_BASED_OUTPATIENT_CLINIC_OR_DEPARTMENT_OTHER)
Admission: EM | Admit: 2016-10-22 | Discharge: 2016-10-22 | Disposition: A | Discharge status: Home / Self Care | Payer: Self-pay | Attending: Emergency Medicine | Admitting: Emergency Medicine

## 2016-10-22 ENCOUNTER — Encounter (HOSPITAL_BASED_OUTPATIENT_CLINIC_OR_DEPARTMENT_OTHER): Payer: Self-pay | Admitting: *Deleted

## 2016-10-22 DIAGNOSIS — S80862A Insect bite (nonvenomous), left lower leg, initial encounter: Secondary | ICD-10-CM | POA: Insufficient documentation

## 2016-10-22 DIAGNOSIS — Y939 Activity, unspecified: Secondary | ICD-10-CM | POA: Insufficient documentation

## 2016-10-22 DIAGNOSIS — W57XXXA Bitten or stung by nonvenomous insect and other nonvenomous arthropods, initial encounter: Secondary | ICD-10-CM | POA: Insufficient documentation

## 2016-10-22 DIAGNOSIS — Y929 Unspecified place or not applicable: Secondary | ICD-10-CM | POA: Insufficient documentation

## 2016-10-22 DIAGNOSIS — Z79899 Other long term (current) drug therapy: Secondary | ICD-10-CM | POA: Insufficient documentation

## 2016-10-22 DIAGNOSIS — Y999 Unspecified external cause status: Secondary | ICD-10-CM | POA: Insufficient documentation

## 2016-10-22 MED ORDER — CEPHALEXIN 250 MG PO CAPS
500.0000 mg | ORAL_CAPSULE | Freq: Once | ORAL | Status: AC
  Administered 2016-10-22: 500 mg via ORAL
  Filled 2016-10-22: qty 2

## 2016-10-22 MED ORDER — CEPHALEXIN 500 MG PO CAPS
500.0000 mg | ORAL_CAPSULE | Freq: Two times a day (BID) | ORAL | 0 refills | Status: DC
Start: 2016-10-22 — End: 2017-03-06

## 2016-10-22 NOTE — ED Notes (Signed)
EMT at bedside performing wound care.

## 2016-10-22 NOTE — ED Triage Notes (Signed)
She thinks she has 2 spider bites on her left lower leg that she noticed a week ago.

## 2016-10-22 NOTE — ED Provider Notes (Signed)
[facility] DEPT [facility] Provider Note   CSN: [id number] Arrival date & time: 10/22/16  1820     History   Chief Complaint Chief Complaint  Patient presents with  . Insect Bite    HPI Marilyn Rivera is a 36 y.o. female who presents emergency department today for 2 spider bites last week to left lower leg. Patient notes that started as small wound and is now increased in area with spreading redness surrounding this. She has been applying hydroperoxide Neosporin to the area. She denies fever, chills at home.  HPI  Past Medical History:  Diagnosis Date  . Diverticulitis large intestine w/o perforation or abscess w/o bleeding 10/05/2014  . Diverticulosis   . GERD (gastroesophageal reflux disease)   . Helicobacter pylori gastritis 09/06/2014  . Kidney stones     Patient Active Problem List   Diagnosis Date Noted  . Right lower quadrant abdominal pain   . Dysphagia   . Hematemesis   . Acute diverticulitis 07/31/2016  . GERD (gastroesophageal reflux disease) 04/24/2016  . Diverticulitis large intestine w/o perforation or abscess w/o bleeding 10/05/2014  . Helicobacter pylori gastritis 09/06/2014  . Normocytic anemia   . Morbid obesity ([place]) 04/27/2014  . UTI (lower urinary tract infection) 04/27/2014    Past Surgical History:  Procedure Laterality Date  . COLONOSCOPY N/A 06/10/2014   Dr. Oneida Alar; redundant sigmoid colon, moderate diverticulosis in the sigmoid and descending colon. small internal hemorrhoids. Next screening at age 60.   Marland Kitchen ESOPHAGOGASTRODUODENOSCOPY N/A 06/10/2014   Dr. Oneida Alar: H.pylori gastritis s/p treatment with Amoxicillin and Biaxin  . KNEE SURGERY Right   . renal calculi removal Left 2010  . SKIN LESION EXCISION     over right eyebrow due to wax being left above eye and it seeped down into pore    OB History    No data available       Home Medications    Prior to Admission medications   Medication Sig Start Date End Date Taking? Authorizing  Provider  amoxicillin-clavulanate (AUGMENTIN) 875-125 MG tablet Take 1 tablet by mouth every 12 (twelve) hours. 08/04/16   Orvan Falconer, MD  cephALEXin (KEFLEX) 500 MG capsule Take 1 capsule (500 mg total) by mouth 2 (two) times daily. 10/22/16   Shantrell Placzek, Barth Kirks, PA-C  fluconazole (DIFLUCAN) 200 MG tablet Take 1 tablet (200 mg total) by mouth daily. 08/05/16   Orvan Falconer, MD  HYDROcodone-acetaminophen (NORCO/VICODIN) 5-325 MG tablet Take 1 tablet by mouth every 6 (six) hours as needed for severe pain. Patient not taking: Reported on 07/31/2016 04/24/16   Danie Binder, MD  ibuprofen (ADVIL,MOTRIN) 200 MG tablet Take 200 mg by mouth every 6 (six) hours as needed for moderate pain.    [provider]  levocetirizine (XYZAL) 5 MG tablet Take 5 mg by mouth every evening.    [provider]  Multiple Vitamins-Minerals (CENTRUM ADULTS PO) Take 1 tablet by mouth daily.    [provider]  norgestimate-ethinyl estradiol ([place] 28) 0.25-35 MG-MCG tablet Take 1 tablet by mouth daily.    [provider]  omeprazole (PRILOSEC) 20 MG capsule 1 PO 30 MINS PRIOR TO BREAKFAST. Patient taking differently: Take 20 mg by mouth 2 (two) times daily before a meal. 1 PO 30 MINS PRIOR TO BREAKFAST. 04/24/16   Fields, Marga Melnick, MD  oxyCODONE-acetaminophen (PERCOCET/ROXICET) 5-325 MG tablet Take 1-2 tablets by mouth every 4 (four) hours as needed. 08/06/16   Virgel Manifold, MD    Family History  Family History  Problem Relation Age of Onset  . Heart failure Mother   . Cancer Other        mother's side of family, breast cancer  . Cancer Other        father's side of family, leukemia  . Hypertension Father   . Diabetes Other   . Hypertension Other   . Colon cancer Neg Hx     Social History Social History  Substance Use Topics  . Smoking status: Never Smoker  . Smokeless tobacco: Never Used  . Alcohol use No     Allergies   Coconut fatty acids   Review of Systems Review of  Systems  Constitutional: Negative for chills and fever.  Skin: Positive for color change and wound.  Neurological: Negative for numbness.     Physical Exam Updated Vital Signs BP (!) 158/98   Pulse 63   Temp 98.3 F (36.8 C) (Oral)   Resp 20   Ht 5' 9.5" (1.765 m)   Wt (!) 176.9 kg (390 lb)   SpO2 99%   BMI 56.77 kg/m   Physical Exam  Constitutional: She appears well-developed and well-nourished.  HENT:  Head: Normocephalic and atraumatic.  Right Ear: External ear normal.  Left Ear: External ear normal.  Eyes: Conjunctivae are normal. Right eye exhibits no discharge. Left eye exhibits no discharge. No scleral icterus.  Cardiovascular:  Pulses:      Dorsalis pedis pulses are 2+ on the right side, and 2+ on the left side.       Posterior tibial pulses are 2+ on the right side, and 2+ on the left side.  No calf swelling or tenderness b/l.   Pulmonary/Chest: Effort normal. No respiratory distress.  Musculoskeletal:       Left knee: Normal.       Left ankle: Normal.  Neurological: She is alert.  Skin: Skin is warm and dry. Capillary refill takes 2 to 3 seconds. No pallor.  2 separate 5 mm open wounds with mild surrounding erythema and heat to the left lower leg. This is isolated. No drainage or fluctuance or induration.  Psychiatric: She has a normal mood and affect.  Nursing note and vitals reviewed.    ED Treatments / Results  Labs (all labs ordered are listed, but only abnormal results are displayed) Labs Reviewed - No data to display  EKG  EKG Interpretation None       Radiology No results found.  Procedures Procedures (including critical care time) EMERGENCY DEPARTMENT US SOFT TISSUE INTERPRETATION "Study: Limited Soft Tissue Ultrasound"  INDICATIONS: Pain and Soft tissue infection Multiple views of the body part were obtained in real-time with a multi-frequency linear probe  PERFORMED BY: Myself IMAGES ARCHIVED?: Yes SIDE:Left BODY PART:Lower  extremity INTERPRETATION:  No abcess noted and Cellulitis present   Medications Ordered in ED Medications  cephALEXin (KEFLEX) capsule 500 mg (500 mg Oral Given 10/22/16 1918)     Initial Impression / Assessment and Plan / ED Course  I have reviewed the triage vital signs and the nursing notes.  Pertinent labs & imaging results that were available during my care of the patient were reviewed by me and considered in my medical decision making (see chart for details).     This is a 36 year old female presenting today with erythema and heat surroding areas s/p possible bug bite last week. Suspect uncomplicated cellulitis based on limited area of involvement, minimal pain and no systemic signs of illness (eg, fever,  chills, dehydration, altered mental status, tachypnea, tachycardia, hypotension), no risk factors for serious illness (eg, extremes of age, general debility, immunocompromised status, history of diabetes, risk factors of HIV, recent use of steroids or other immunosuppressive medications).  PE reveals redness, swelling, mildly tender, warm to touch over area of bite. There is no bleeding, bullae or drainage. The area is non purulent and non circumferential.  Borders are not elevated or sharply demarcated.  Ultrasound preformed and did not detect any occult abscess for which I&D would be possible. Area of infection marked and patient encourage to return if redness begins to streak, extend beyond the markings, and/or fever or nausea/vomiting are to develop  Will prescribed oral Keflex.  Directed patient to apply warm compresses and to return to ED for I&D if pain should intensify or abscess should develop. I advised the patient to follow-up with their primary care provider this week for recheck otherwise. Strict return precautions discussed (as above). Also informed patient they can return to the emergency department with new or worsening symptoms or new concerns. The patient verbalized  understanding and is in agreement with plan. Patient appears reliable for follow up and is agreeable to discharge. At time of discharge the patient is alert, oriented, in NAD, afebrile, non-tachycardic, non-septic and non-toxic appearing. Stable for discharge.    Final Clinical Impressions(s) / ED Diagnoses   Final diagnoses:  Insect bite, initial encounter    New Prescriptions Discharge Medication List as of 10/22/2016  7:50 PM    START taking these medications   Details  cephALEXin (KEFLEX) 500 MG capsule Take 1 capsule (500 mg total) by mouth 2 (two) times daily., Starting Tue 10/22/2016, Print         Deshon Hsiao, Barth Kirks, PA-C 10/23/16 [place], [place], DO 10/23/16 1729

## 2016-10-22 NOTE — ED Notes (Signed)
Pt verbalizes understanding of d/c instructions and denies any further needs at this time. 

## 2016-10-22 NOTE — ED Notes (Signed)
EMT placed bacitracin on both wounds then covered with non adherent dressings and covered with guaze. Gave patient extra supplies for the next couple days.

## 2016-10-22 NOTE — Discharge Instructions (Signed)
Please take all of your antibiotics until finished!   You may develop abdominal discomfort or diarrhea from the antibiotic.  You may help offset this with probiotics which you can buy or get in yogurt. Do not eat  or take the probiotics until 2 hours after your antibiotic. Follow wound care instructions. Follow up with PCP in 1 week for re-check. If you develop worsening or new concerning symptoms you can return to the emergency department for re-evaluation.

## 2016-10-28 ENCOUNTER — Telehealth (HOSPITAL_BASED_OUTPATIENT_CLINIC_OR_DEPARTMENT_OTHER): Payer: Self-pay | Admitting: Emergency Medicine

## 2016-12-20 ENCOUNTER — Other Ambulatory Visit: Payer: Self-pay | Admitting: Gastroenterology

## 2017-03-06 ENCOUNTER — Ambulatory Visit: Payer: BLUE CROSS/BLUE SHIELD | Admitting: Gastroenterology

## 2017-03-06 ENCOUNTER — Encounter: Payer: Self-pay | Admitting: Gastroenterology

## 2017-03-06 DIAGNOSIS — K219 Gastro-esophageal reflux disease without esophagitis: Secondary | ICD-10-CM

## 2017-03-06 DIAGNOSIS — R1310 Dysphagia, unspecified: Secondary | ICD-10-CM | POA: Diagnosis not present

## 2017-03-06 DIAGNOSIS — R1319 Other dysphagia: Secondary | ICD-10-CM

## 2017-03-06 DIAGNOSIS — K2970 Gastritis, unspecified, without bleeding: Secondary | ICD-10-CM | POA: Diagnosis not present

## 2017-03-06 DIAGNOSIS — K625 Hemorrhage of anus and rectum: Secondary | ICD-10-CM | POA: Insufficient documentation

## 2017-03-06 DIAGNOSIS — B9681 Helicobacter pylori [H. pylori] as the cause of diseases classified elsewhere: Secondary | ICD-10-CM

## 2017-03-06 NOTE — Assessment & Plan Note (Signed)
CLINICALLY IMPROVED.  NEEDS ERADICATION CONFIRMED.

## 2017-03-06 NOTE — Assessment & Plan Note (Signed)
CURRENTLY HAS A COACH AND WORKING ON WEIGHT LOSS. DOWN 18 LBS SINCE 2017.  CONTINUE YOUR WEIGHT LOSS EFFORTS.

## 2017-03-06 NOTE — Progress Notes (Signed)
   Subjective:    Patient ID: Marilyn Rivera, female    DOB: 01-22-1981, 37 y.o.   MRN: [id number]  Patient, No Pcp Per   HPI USING ASA RECENTLY. PART OF FILLING CAME OUT AND USING ASPIRIN: 3 A DAY EVERY DAY. LAST TCS 2016: HEMORRHOIDS AND TICS. RECTAL BLEEDING: SUN/MON. IT WAS A LOT.NO RECTAL PRESSURE, PAIN, ITCHING, BURNING, OR SOILING. ACID REFLUX KINDA BAD THIS WEEK. FEELS LIKE A BALL/KNEADING IN HER STOMACH. TAKING EVERY OTHER DAY. ALWAYS FEELS NAUSEATED. DRINK GINGER ALE. BMs: Q1-2 DAYS, NL STOOL. DOES A LITTLE WORK UP ABDS/LEGS/STEPS/CARDIO. HAVING MENSTRUAL CYCLE THAT COMES AND GOES.  PT DENIES FEVER, CHILLS, HEMATEMESIS, vomiting, melena, diarrhea, CHEST PAIN, SHORTNESS OF BREATH, CHANGE IN BOWEL IN HABITS, constipation, problems swallowing, OR problems with sedation.  Past Medical History:  Diagnosis Date  . Diverticulitis large intestine w/o perforation or abscess w/o bleeding 10/05/2014  . Diverticulosis   . GERD (gastroesophageal reflux disease)   . Helicobacter pylori gastritis 09/06/2014  . Kidney stones     Past Surgical History:  Procedure Laterality Date  . COLONOSCOPY N/A 06/10/2014   Dr. Oneida Alar; redundant sigmoid colon, moderate diverticulosis in the sigmoid and descending colon. small internal hemorrhoids. Next screening at age 63.   Marland Kitchen ESOPHAGOGASTRODUODENOSCOPY N/A 06/10/2014   Dr. Oneida Alar: H.pylori gastritis s/p treatment with Amoxicillin and Biaxin  . KNEE SURGERY Right   . renal calculi removal Left 2010  . SKIN LESION EXCISION     over right eyebrow due to wax being left above eye and it seeped down into pore    Allergies  Allergen Reactions  . Coconut Fatty Acids    Current Outpatient Medications  Medication Sig Dispense Refill  . aspirin 325 MG tablet Take 325 mg by mouth as needed.    Marland Kitchen ibuprofen (ADVIL,MOTRIN) 200 MG tablet Take 200 mg by mouth every 6 (six) hours as needed for moderate pain.    . Multiple Vitamins-Minerals (CENTRUM ADULTS PO) Take 1  tablet by mouth daily.    Marland Kitchen omeprazole (PRILOSEC) 20 MG capsule TAKE (1) CAPSULE DAILY BEFORE BREAKFAST.     Review of Systems PER HPI OTHERWISE ALL SYSTEMS ARE NEGATIVE.    Objective:   Physical Exam  Constitutional: She is oriented to person, place, and time. She appears well-developed and well-nourished. No distress.  HENT:  Head: Normocephalic and atraumatic.  Mouth/Throat: Oropharynx is clear and moist. No oropharyngeal exudate.  Eyes: Pupils are equal, round, and reactive to light. No scleral icterus.  Neck: Normal range of motion. Neck supple.  Cardiovascular: Normal rate, regular rhythm and normal heart sounds.  Pulmonary/Chest: Effort normal and breath sounds normal. No respiratory distress.  Abdominal: Soft. Bowel sounds are normal. She exhibits no distension. There is no tenderness.  Musculoskeletal: She exhibits no edema.  Lymphadenopathy:    She has no cervical adenopathy.  Neurological: She is alert and oriented to person, place, and time.  NO FOCAL DEFICITS  Psychiatric: She has a normal mood and affect.  Vitals reviewed.     Assessment & Plan:

## 2017-03-06 NOTE — Assessment & Plan Note (Signed)
SYMPTOMS FAIRLY WELL CONTROLLED ON PRN OMEPRAZOLE.  AVOID REFLUX TRIGGERS.  HANDOUT GIVEN. CONTINUE OMEPRAZOLE.  TAKE 30 MINUTES PRIOR TO YOUR FIRST MEAL EVERY DAY TO PREVENT ULCERS, REFLUX, AND GASTRITIS. CONTINUE YOUR WEIGHT LOSS EFFORTS. FOLLOW UP IN 6 MOS.

## 2017-03-06 NOTE — Progress Notes (Signed)
NO PCP PER PATIENT °

## 2017-03-06 NOTE — Progress Notes (Signed)
ON RECALL  °

## 2017-03-06 NOTE — Assessment & Plan Note (Addendum)
SYMPTOMS CONTROLLED/RESOLVED AND MOST LIKELY DUE TO DIVERTICULAR BLEED WHILE TAKING ASA, LESS LIKELY HEMORRHOIDS.  COMPLETE LABS. STRICTLY AVOID ASPIRIN. IBUPROFEN IS OK FOR TOOTH PAIN. FOLLOW UP IN 6 MOS.   PLEASE CALL WITH QUESTIONS OR CONCERNS.

## 2017-03-06 NOTE — Patient Instructions (Addendum)
COMPLETE LABS.  STRICTLY AVOID ASPIRIN.  AVOID REFLUX TRIGGERS. SEE INFO BELOW.  CONTINUE OMEPRAZOLE.  TAKE 30 MINUTES PRIOR TO YOUR FIRST MEAL EVERY DAY TO PREVENT ULCERS, REFLUX, AND GASTRITIS.  CONTINUE YOUR WEIGHT LOSS EFFORTS.  FOLLOW UP IN 6 MOS.   PLEASE CALL WITH QUESTIONS OR CONCERNS.     Lifestyle and home remedies TO MANAGE REFLUX/CHEST PAIN  You may eliminate or reduce the frequency of heartburn by making the following lifestyle changes:  . Control your weight. Being overweight is a major risk factor for heartburn and GERD. Excess pounds put pressure on your abdomen, pushing up your stomach and causing acid to back up into your esophagus.   . Eat smaller meals. 4 TO 6 MEALS A DAY. This reduces pressure on the lower esophageal sphincter, helping to prevent the valve from opening and acid from washing back into your esophagus.   Dolphus Jenny your belt. Clothes that fit tightly around your waist put pressure on your abdomen and the lower esophageal sphincter.   . Eliminate heartburn triggers. Everyone has specific triggers. Common triggers such as fatty or fried foods, spicy food, tomato sauce, carbonated beverages, alcohol, chocolate, mint, garlic, onion, caffeine and nicotine may make heartburn worse.   Marland Kitchen Avoid stooping or bending. Tying your shoes is OK. Bending over for longer periods to weed your garden isn't, especially soon after eating.   . Don't lie down after a meal. Wait at least three to four hours after eating before going to bed, and don't lie down right after eating.   Marland Kitchen PUT THE HEAD OF YOUR BED ON 6 INCH BLOCKS.   Alternative medicine . Several home remedies exist for treating GERD, but they provide only temporary relief. They include drinking baking soda (sodium bicarbonate) added to water or drinking other fluids such as baking soda mixed with cream of tartar and water.  . Although these liquids create temporary relief by neutralizing, washing away or  buffering acids, eventually they aggravate the situation by adding gas and fluid to your stomach, increasing pressure and causing more acid reflux. Further, adding more sodium to your diet may increase your blood pressure and add stress to your heart, and excessive bicarbonate ingestion can alter the acid-base balance in your body.      Low-Fat Diet BREADS, CEREALS, PASTA, RICE, DRIED PEAS, AND BEANS These products are high in carbohydrates and most are low in fat. Therefore, they can be increased in the diet as substitutes for fatty foods. They too, however, contain calories and should not be eaten in excess. Cereals can be eaten for snacks as well as for breakfast.   FRUITS AND VEGETABLES It is good to eat fruits and vegetables. Besides being sources of fiber, both are rich in vitamins and some minerals. They help you get the daily allowances of these nutrients. Fruits and vegetables can be used for snacks and desserts.  MEATS Limit lean meat, chicken, [place], and fish to no more than 6 ounces per day. Beef, Pork, and Lamb Use lean cuts of beef, pork, and lamb. Lean cuts include:  Extra-lean ground beef.  Arm roast.  Sirloin tip.  Center-cut ham.  Round steak.  Loin chops.  Rump roast.  Tenderloin.  Trim all fat off the outside of meats before cooking. It is not necessary to severely decrease the intake of red meat, but lean choices should be made. Lean meat is rich in protein and contains a highly absorbable form of iron. Premenopausal women, in particular,  should avoid reducing lean red meat because this could increase the risk for low red blood cells (iron-deficiency anemia).  Chicken and [place] These are good sources of protein. The fat of poultry can be reduced by removing the skin and underlying fat layers before cooking. Chicken and [place] can be substituted for lean red meat in the diet. Poultry should not be fried or covered with high-fat sauces. Fish and Shellfish Fish is a  good source of protein. Shellfish contain cholesterol, but they usually are low in saturated fatty acids. The preparation of fish is important. Like chicken and [place], they should not be fried or covered with high-fat sauces. EGGS Egg whites contain no fat or cholesterol. They can be eaten often. Try 1 to 2 egg whites instead of whole eggs in recipes or use egg substitutes that do not contain yolk. MILK AND DAIRY PRODUCTS Use skim or 1% milk instead of 2% or whole milk. Decrease whole milk, natural, and processed cheeses. Use nonfat or low-fat (2%) cottage cheese or low-fat cheeses made from vegetable oils. Choose nonfat or low-fat (1 to 2%) yogurt. Experiment with evaporated skim milk in recipes that call for heavy cream. Substitute low-fat yogurt or low-fat cottage cheese for sour cream in dips and salad dressings. Have at least 2 servings of low-fat dairy products, such as 2 glasses of skim (or 1%) milk each day to help get your daily calcium intake. FATS AND OILS Reduce the total intake of fats, especially saturated fat. Butterfat, lard, and beef fats are high in saturated fat and cholesterol. These should be avoided as much as possible. Vegetable fats do not contain cholesterol, but certain vegetable fats, such as coconut oil, palm oil, and palm kernel oil are very high in saturated fats. These should be limited. These fats are often used in bakery goods, processed foods, popcorn, oils, and nondairy creamers. Vegetable shortenings and some peanut butters contain hydrogenated oils, which are also saturated fats. Read the labels on these foods and check for saturated vegetable oils. Unsaturated vegetable oils and fats do not raise blood cholesterol. However, they should be limited because they are fats and are high in calories. Total fat should still be limited to 30% of your daily caloric intake. Desirable liquid vegetable oils are corn oil, cottonseed oil, olive oil, canola oil, safflower oil, soybean  oil, and sunflower oil. Peanut oil is not as good, but small amounts are acceptable. Buy a heart-healthy tub margarine that has no partially hydrogenated oils in the ingredients. Mayonnaise and salad dressings often are made from unsaturated fats, but they should also be limited because of their high calorie and fat content. Seeds, nuts, peanut butter, olives, and avocados are high in fat, but the fat is mainly the unsaturated type. These foods should be limited mainly to avoid excess calories and fat. OTHER EATING TIPS Snacks  Most sweets should be limited as snacks. They tend to be rich in calories and fats, and their caloric content outweighs their nutritional value. Some good choices in snacks are graham crackers, melba toast, soda crackers, bagels (no egg), English muffins, fruits, and vegetables. These snacks are preferable to snack crackers, [place] fries, TORTILLA CHIPS, and POTATO chips. Popcorn should be air-popped or cooked in small amounts of liquid vegetable oil. Desserts Eat fruit, low-fat yogurt, and fruit ices instead of pastries, cake, and cookies. Sherbet, angel food cake, gelatin dessert, frozen low-fat yogurt, or other frozen products that do not contain saturated fat (pure fruit juice bars, frozen ice  pops) are also acceptable.  COOKING METHODS Choose those methods that use little or no fat. They include: Poaching.  Braising.  Steaming.  Grilling.  Baking.  Stir-frying.  Broiling.  Microwaving.  Foods can be cooked in a nonstick pan without added fat, or use a nonfat cooking spray in regular cookware. Limit fried foods and avoid frying in saturated fat. Add moisture to lean meats by using water, broth, cooking wines, and other nonfat or low-fat sauces along with the cooking methods mentioned above. Soups and stews should be chilled after cooking. The fat that forms on top after a few hours in the refrigerator should be skimmed off. When preparing meals, avoid using excess salt.  Salt can contribute to raising blood pressure in some people.  EATING AWAY FROM HOME Order entres, potatoes, and vegetables without sauces or butter. When meat exceeds the size of a deck of cards (3 to 4 ounces), the rest can be taken home for another meal. Choose vegetable or fruit salads and ask for low-calorie salad dressings to be served on the side. Use dressings sparingly. Limit high-fat toppings, such as bacon, crumbled eggs, cheese, sunflower seeds, and olives. Ask for heart-healthy tub margarine instead of butter.

## 2017-03-06 NOTE — Assessment & Plan Note (Signed)
SYMPTOMS CONTROLLED/RESOLVED.  CONTINUE TO MONITOR SYMPTOMS. 

## 2017-04-03 ENCOUNTER — Other Ambulatory Visit: Payer: Self-pay

## 2017-04-03 ENCOUNTER — Emergency Department (HOSPITAL_COMMUNITY)
Admission: EM | Admit: 2017-04-03 | Discharge: 2017-04-03 | Disposition: A | Discharge status: Home / Self Care | Payer: BLUE CROSS/BLUE SHIELD | Attending: Emergency Medicine | Admitting: Emergency Medicine

## 2017-04-03 ENCOUNTER — Encounter (HOSPITAL_COMMUNITY): Payer: Self-pay | Admitting: Emergency Medicine

## 2017-04-03 ENCOUNTER — Emergency Department (HOSPITAL_COMMUNITY): Payer: BLUE CROSS/BLUE SHIELD

## 2017-04-03 DIAGNOSIS — Z79899 Other long term (current) drug therapy: Secondary | ICD-10-CM | POA: Insufficient documentation

## 2017-04-03 DIAGNOSIS — R1010 Upper abdominal pain, unspecified: Secondary | ICD-10-CM | POA: Diagnosis not present

## 2017-04-03 DIAGNOSIS — R69 Illness, unspecified: Secondary | ICD-10-CM | POA: Diagnosis not present

## 2017-04-03 DIAGNOSIS — R509 Fever, unspecified: Secondary | ICD-10-CM | POA: Diagnosis not present

## 2017-04-03 DIAGNOSIS — J111 Influenza due to unidentified influenza virus with other respiratory manifestations: Secondary | ICD-10-CM | POA: Diagnosis not present

## 2017-04-03 DIAGNOSIS — R05 Cough: Secondary | ICD-10-CM | POA: Diagnosis not present

## 2017-04-03 DIAGNOSIS — K5900 Constipation, unspecified: Secondary | ICD-10-CM | POA: Insufficient documentation

## 2017-04-03 DIAGNOSIS — Z7982 Long term (current) use of aspirin: Secondary | ICD-10-CM | POA: Insufficient documentation

## 2017-04-03 DIAGNOSIS — M791 Myalgia, unspecified site: Secondary | ICD-10-CM | POA: Diagnosis not present

## 2017-04-03 DIAGNOSIS — J3489 Other specified disorders of nose and nasal sinuses: Secondary | ICD-10-CM | POA: Insufficient documentation

## 2017-04-03 DIAGNOSIS — R0602 Shortness of breath: Secondary | ICD-10-CM | POA: Diagnosis not present

## 2017-04-03 LAB — CBC WITH DIFFERENTIAL/PLATELET
BASOS ABS: 0.1 10*3/uL (ref 0.0–0.1)
Basophils Relative: 1 %
Eosinophils Absolute: 0.1 10*3/uL (ref 0.0–0.7)
Eosinophils Relative: 2 %
HEMATOCRIT: 29.8 % — AB (ref 36.0–46.0)
HEMOGLOBIN: 8.5 g/dL — AB (ref 12.0–15.0)
LYMPHS PCT: 36 %
Lymphs Abs: 2.7 10*3/uL (ref 0.7–4.0)
MCH: 20.6 pg — ABNORMAL LOW (ref 26.0–34.0)
MCHC: 28.5 g/dL — ABNORMAL LOW (ref 30.0–36.0)
MCV: 72.2 fL — ABNORMAL LOW (ref 78.0–100.0)
Monocytes Absolute: 1 10*3/uL (ref 0.1–1.0)
Monocytes Relative: 14 %
NEUTROS ABS: 3.7 10*3/uL (ref 1.7–7.7)
Neutrophils Relative %: 49 %
Platelets: 221 10*3/uL (ref 150–400)
RBC: 4.13 MIL/uL (ref 3.87–5.11)
RDW: 19.4 % — AB (ref 11.5–15.5)
WBC: 7.6 10*3/uL (ref 4.0–10.5)

## 2017-04-03 LAB — COMPREHENSIVE METABOLIC PANEL
ALBUMIN: 3.3 g/dL — AB (ref 3.5–5.0)
ALT: 15 U/L (ref 14–54)
ANION GAP: 10 (ref 5–15)
AST: 16 U/L (ref 15–41)
Alkaline Phosphatase: 60 U/L (ref 38–126)
BILIRUBIN TOTAL: 0.5 mg/dL (ref 0.3–1.2)
BUN: 9 mg/dL (ref 6–20)
CO2: 23 mmol/L (ref 22–32)
Calcium: 8.4 mg/dL — ABNORMAL LOW (ref 8.9–10.3)
Chloride: 105 mmol/L (ref 101–111)
Creatinine, Ser: 0.82 mg/dL (ref 0.44–1.00)
GFR calc Af Amer: >60 mL/min (ref >60)
Glucose, Bld: 100 mg/dL — ABNORMAL HIGH (ref 65–99)
POTASSIUM: 3.4 mmol/L — AB (ref 3.5–5.1)
Sodium: 138 mmol/L (ref 135–145)
TOTAL PROTEIN: 7 g/dL (ref 6.5–8.1)

## 2017-04-03 MED ORDER — LACTULOSE 10 GM/15ML PO SOLN: 10.0000 g | Freq: Three times a day (TID) | ORAL | 0 refills | Status: DC | PRN

## 2017-04-03 MED ORDER — OSELTAMIVIR PHOSPHATE 75 MG PO CAPS
75.0000 mg | ORAL_CAPSULE | Freq: Once | ORAL | Status: AC
  Administered 2017-04-03: 75 mg via ORAL
  Filled 2017-04-03: qty 1

## 2017-04-03 MED ORDER — OXYCODONE-ACETAMINOPHEN 5-325 MG PO TABS
1.0000 | ORAL_TABLET | Freq: Once | ORAL | Status: AC
  Administered 2017-04-03: 1 via ORAL
  Filled 2017-04-03: qty 1

## 2017-04-03 MED ORDER — GI COCKTAIL ~~LOC~~
30.0000 mL | Freq: Once | ORAL | Status: AC
  Administered 2017-04-03: 30 mL via ORAL
  Filled 2017-04-03: qty 30

## 2017-04-03 MED ORDER — OSELTAMIVIR PHOSPHATE 75 MG PO CAPS: 75.0000 mg | ORAL_CAPSULE | Freq: Two times a day (BID) | ORAL | 0 refills | Status: DC

## 2017-04-03 NOTE — ED Triage Notes (Signed)
Pt now states she normally has a 2 or 3 BM a day, had not been to the bathroom in 3 days.

## 2017-04-03 NOTE — Discharge Instructions (Signed)
You appear to have a influenza virus infection.  Take the prescription antiviral medicine, to treat the illness.  For cough, use Robitussin-DM.  Drinking plenty of water, and eating a high-fiber diet to improve your constipation.  Use the lactulose, stool softener, instead of MiraLAX to help improve your stooling.  Follow-up with a primary care doctor for a checkup in 3 or 4 days.

## 2017-04-03 NOTE — ED Provider Notes (Signed)
[facility] EMERGENCY DEPARTMENT Provider Note   CSN: [id number] Arrival date & time: 04/03/17  1317     History   Chief Complaint Chief Complaint  Patient presents with  . Constipation  . Generalized Body Aches    HPI Marilyn Rivera is a 37 y.o. female.  She presents for evaluation of cough, productive of green to yellow sputum, and fever, for 1 day.  Last night she had a temperature of 101.4, for which she took ibuprofen.  She also complains of rhinorrhea, and general achiness.  Additionally, she has had trouble with bowel movements with a cramping sensation in her upper abdomen, for 3 days.  She typically has several stools each day.  She is using MiraLAX, without relief.  She has not eaten much in the last 36 hours.  She denies nausea, vomiting, focal weakness or paresthesia.  There are no other known modifying factors.  HPI  Past Medical History:  Diagnosis Date  . Diverticulitis large intestine w/o perforation or abscess w/o bleeding 10/05/2014  . Diverticulosis   . GERD (gastroesophageal reflux disease)   . Helicobacter pylori gastritis 09/06/2014  . Kidney stones     Patient Active Problem List   Diagnosis Date Noted  . Rectal bleeding 03/06/2017  . Dysphagia   . GERD (gastroesophageal reflux disease) 04/24/2016  . Helicobacter pylori gastritis 09/06/2014  . Normocytic anemia   . Morbid obesity ([place]) 04/27/2014  . UTI (lower urinary tract infection) 04/27/2014    Past Surgical History:  Procedure Laterality Date  . COLONOSCOPY N/A 06/10/2014   Dr. Oneida Alar; redundant sigmoid colon, moderate diverticulosis in the sigmoid and descending colon. small internal hemorrhoids. Next screening at age 47.   Marland Kitchen ESOPHAGOGASTRODUODENOSCOPY N/A 06/10/2014   Dr. Oneida Alar: H.pylori gastritis s/p treatment with Amoxicillin and Biaxin  . KNEE SURGERY Right   . renal calculi removal Left 2010  . SKIN LESION EXCISION     over right eyebrow due to wax being left above eye and it seeped  down into pore    OB History    No data available       Home Medications    Prior to Admission medications   Medication Sig Start Date End Date Taking? Authorizing Provider  aspirin 325 MG tablet Take 325 mg by mouth as needed.   Yes [provider]  ibuprofen (ADVIL,MOTRIN) 200 MG tablet Take 200 mg by mouth every 6 (six) hours as needed for moderate pain.   Yes [provider]  omeprazole (PRILOSEC) 20 MG capsule TAKE (1) CAPSULE DAILY BEFORE BREAKFAST. 12/20/16  Yes Carlis Stable, NP  lactulose (CHRONULAC) 10 GM/15ML solution Take 15 mLs (10 g total) by mouth 3 (three) times daily as needed for moderate constipation. 04/03/17   Daleen Bo, MD  oseltamivir (TAMIFLU) 75 MG capsule Take 1 capsule (75 mg total) by mouth every 12 (twelve) hours. 04/03/17   Daleen Bo, MD  Linaclotide Rolan Lipa) 145 MCG CAPS capsule 1 PO 30 mins prior to your first meal Patient not taking: Reported on 09/01/2014 06/10/14 09/02/14  Danie Binder, MD    Family History Family History  Problem Relation Age of Onset  . Heart failure Mother   . Cancer Other        mother's side of family, breast cancer  . Cancer Other        father's side of family, leukemia  . Hypertension Father   . Diabetes Other   . Hypertension Other   . Colon cancer  Neg Hx     Social History Social History   Tobacco Use  . Smoking status: Never Smoker  . Smokeless tobacco: Never Used  Substance Use Topics  . Alcohol use: No  . Drug use: No     Allergies   Coconut fatty acids   Review of Systems Review of Systems  All other systems reviewed and are negative.    Physical Exam Updated Vital Signs BP 119/61 (BP Location: Right Arm)   Pulse 77   Temp 99.1 F (37.3 C) (Oral)   Resp 18   Ht 5' 9.5" (1.765 m)   Wt (!) 179.6 kg (396 lb)   LMP 03/24/2017   SpO2 99%   BMI 57.64 kg/m   Physical Exam  Constitutional: She is oriented to person, place, and time. She appears well-developed. No  distress.  Morbidly obese  HENT:  Head: Normocephalic and atraumatic.  Eyes: Conjunctivae and EOM are normal. Pupils are equal, round, and reactive to light.  Neck: Normal range of motion and phonation normal. Neck supple.  Cardiovascular: Normal rate and regular rhythm.  Pulmonary/Chest: Effort normal and breath sounds normal. No stridor. No respiratory distress. She has no wheezes. She exhibits no tenderness.  Abdominal: Soft. She exhibits no distension. There is no tenderness. There is no guarding.  Musculoskeletal: Normal range of motion.  Neurological: She is alert and oriented to person, place, and time. She exhibits normal muscle tone.  Skin: Skin is warm and dry.  Psychiatric: She has a normal mood and affect. Her behavior is normal. Judgment and thought content normal.  Nursing note and vitals reviewed.    ED Treatments / Results  Labs (all labs ordered are listed, but only abnormal results are displayed) Labs Reviewed  COMPREHENSIVE METABOLIC PANEL - Abnormal; Notable for the following components:      Result Value   Potassium 3.4 (*)    Glucose, Bld 100 (*)    Calcium 8.4 (*)    Albumin 3.3 (*)    All other components within normal limits  CBC WITH DIFFERENTIAL/PLATELET - Abnormal; Notable for the following components:   Hemoglobin 8.5 (*)    HCT 29.8 (*)    MCV 72.2 (*)    MCH 20.6 (*)    MCHC 28.5 (*)    RDW 19.4 (*)    All other components within normal limits    EKG  EKG Interpretation None       Radiology Dg Chest 2 View  Result Date: 04/03/2017 CLINICAL DATA:  Shortness of breath with cough and congestion. Fever. EXAM: CHEST  2 VIEW COMPARISON:  October 27, 2014 FINDINGS: Lungs are clear. Heart size and pulmonary vascularity are normal. No adenopathy. No bone lesions. IMPRESSION: No edema or consolidation. Electronically Signed   By: Lowella Grip III M.D.   On: 04/03/2017 14:35    Procedures Procedures (including critical care  time)  Medications Ordered in ED Medications  oseltamivir (TAMIFLU) capsule 75 mg (75 mg Oral Given 04/03/17 2001)  oxyCODONE-acetaminophen (PERCOCET/ROXICET) 5-325 MG per tablet 1 tablet (1 tablet Oral Given 04/03/17 2037)  gi cocktail (Maalox,Lidocaine,Donnatal) (30 mLs Oral Given 04/03/17 2037)     Initial Impression / Assessment and Plan / ED Course  I have reviewed the triage vital signs and the nursing notes.  Pertinent labs & imaging results that were available during my care of the patient were reviewed by me and considered in my medical decision making (see chart for details).       Patient  Vitals for the past 24 hrs:  BP Temp Temp src Pulse Resp SpO2 Height Weight  04/03/17 2004 - 99.1 F (37.3 C) Oral 77 18 99 % - -  04/03/17 1815 - 98.9 F (37.2 C) Oral - - - - -  04/03/17 1549 119/61 100.2 F (37.9 C) - 77 20 100 % - -  04/03/17 1351 (!) 143/57 98.9 F (37.2 C) Oral 74 18 97 % 5' 9.5" (1.765 m) (!) 179.6 kg (396 lb)    9:33 PM Reevaluation with update and discussion. After initial assessment and treatment, an updated evaluation reveals she feels better after GI cocktail was given for upper abdominal pain.  She requests using another stool softener since MiraLAX is not working.  Findings discussed, and questions answered. Daleen Bo       Final Clinical Impressions(s) / ED Diagnoses   Final diagnoses:  Influenza-like illness  Constipation, unspecified constipation type    Cough and fever likely caused by influenza virus infection.  Unrelated upper abdominal pain likely due to constipation.  Doubt serious bacterial infection, metabolic instability or impending vascular collapse.  Nursing Notes Reviewed/ Care Coordinated Applicable Imaging Reviewed Interpretation of Laboratory Data incorporated into ED treatment  The patient appears reasonably screened and/or stabilized for discharge and I doubt any other medical condition or other [facility] requiring further  screening, evaluation, or treatment in the ED at this time prior to discharge.  Plan: Home Medications-continue usual home medicines, except MiraLAX, use APAP for fever or pain; Home Treatments-rest, fluids, high-fiber diet; return here if the recommended treatment, does not improve the symptoms; Recommended follow up-PCP checkup 3 or 4 days if needed.   ED Discharge Orders        Ordered    oseltamivir (TAMIFLU) 75 MG capsule  Every 12 hours     04/03/17 2024    lactulose (CHRONULAC) 10 GM/15ML solution  3 times daily PRN     04/03/17 2132       Daleen Bo, MD 04/03/17 2135

## 2017-04-03 NOTE — ED Triage Notes (Signed)
Onset yesterday, body aches, cough with yellow sputum, fever last night, headache, nausea

## 2017-04-03 NOTE — ED Notes (Signed)
Pt c/o epigastric pain, edp notified

## 2017-04-10 ENCOUNTER — Encounter: Payer: Self-pay | Admitting: Gastroenterology

## 2017-04-10 ENCOUNTER — Telehealth: Payer: Self-pay | Admitting: Gastroenterology

## 2017-04-10 DIAGNOSIS — R1030 Lower abdominal pain, unspecified: Secondary | ICD-10-CM

## 2017-04-10 NOTE — Telephone Encounter (Signed)
PLEASE CALL PT. IF SHE IS HAVING DIFFICULTY WITH BMs AND LOWER ABDOMINAL PAIN SHE NEEDS A CT SCAN TODAY TO EVALUATE FOR DIVERTICULITIS.

## 2017-04-10 NOTE — Telephone Encounter (Signed)
Called # for spouse listed in chart and was advised pt would not be home until 10:00pm and he is unable to reach her until then. He advised he will give her the message we called and have her call us tomorrow.

## 2017-04-10 NOTE — Telephone Encounter (Signed)
Contacted pt insurance and no PA is required for CT ABD/PELVIS with contrast.  Called patient and LMOVM.

## 2017-04-10 NOTE — Telephone Encounter (Signed)
Tried to call pt, no answer, LMOVM for her call office asap so we can get more info to schedule CT today. Informed her office phone turns off at 4:45pm.

## 2017-04-11 ENCOUNTER — Ambulatory Visit (HOSPITAL_COMMUNITY)
Admission: RE | Admit: 2017-04-11 | Discharge: 2017-04-11 | Disposition: A | Discharge status: Home / Self Care | Payer: BLUE CROSS/BLUE SHIELD | Source: Ambulatory Visit | Attending: Gastroenterology | Admitting: Gastroenterology

## 2017-04-11 ENCOUNTER — Telehealth: Payer: Self-pay | Admitting: Nurse Practitioner

## 2017-04-11 ENCOUNTER — Other Ambulatory Visit (HOSPITAL_COMMUNITY)
Admission: RE | Admit: 2017-04-11 | Discharge: 2017-04-11 | Disposition: A | Discharge status: Home / Self Care | Payer: BLUE CROSS/BLUE SHIELD | Source: Ambulatory Visit | Attending: Gastroenterology | Admitting: Gastroenterology

## 2017-04-11 ENCOUNTER — Other Ambulatory Visit: Payer: Self-pay | Admitting: *Deleted

## 2017-04-11 DIAGNOSIS — R1030 Lower abdominal pain, unspecified: Secondary | ICD-10-CM

## 2017-04-11 DIAGNOSIS — K632 Fistula of intestine: Secondary | ICD-10-CM | POA: Diagnosis not present

## 2017-04-11 DIAGNOSIS — M5136 Other intervertebral disc degeneration, lumbar region: Secondary | ICD-10-CM | POA: Insufficient documentation

## 2017-04-11 DIAGNOSIS — K5730 Diverticulosis of large intestine without perforation or abscess without bleeding: Secondary | ICD-10-CM | POA: Insufficient documentation

## 2017-04-11 DIAGNOSIS — K5732 Diverticulitis of large intestine without perforation or abscess without bleeding: Secondary | ICD-10-CM

## 2017-04-11 DIAGNOSIS — M47816 Spondylosis without myelopathy or radiculopathy, lumbar region: Secondary | ICD-10-CM | POA: Insufficient documentation

## 2017-04-11 DIAGNOSIS — R109 Unspecified abdominal pain: Secondary | ICD-10-CM | POA: Diagnosis not present

## 2017-04-11 DIAGNOSIS — R69 Illness, unspecified: Secondary | ICD-10-CM | POA: Diagnosis present

## 2017-04-11 LAB — HCG, SERUM, QUALITATIVE: Preg, Serum: NEGATIVE

## 2017-04-11 MED ORDER — HYDROCODONE-ACETAMINOPHEN 5-325 MG PO TABS: 1.0000 | ORAL_TABLET | Freq: Four times a day (QID) | ORAL | 0 refills | Status: DC | PRN

## 2017-04-11 MED ORDER — CIPROFLOXACIN HCL 500 MG PO TABS: 500.0000 mg | ORAL_TABLET | Freq: Two times a day (BID) | ORAL | 0 refills | Status: DC

## 2017-04-11 MED ORDER — METRONIDAZOLE 500 MG PO TABS: 500.0000 mg | ORAL_TABLET | Freq: Three times a day (TID) | ORAL | 0 refills | Status: DC

## 2017-04-11 MED ORDER — IOPAMIDOL (ISOVUE-300) INJECTION 61%
100.0000 mL | Freq: Once | INTRAVENOUS | Status: AC | PRN
Start: 2017-04-11 — End: 2017-04-11
  Administered 2017-04-11: 100 mL via INTRAVENOUS

## 2017-04-11 NOTE — Addendum Note (Signed)
Addended by: Gordy Levan, Rakeisha Nyce A on: 04/11/2017 04:38 PM   Modules accepted: Orders

## 2017-04-11 NOTE — Telephone Encounter (Signed)
Please schedule a follow-up OV in 4 weeks.  Marilyn Rivera, please call her to check on her Monday morning. See notes below for details.

## 2017-04-11 NOTE — Telephone Encounter (Signed)
Second attempt made to contact patient.

## 2017-04-11 NOTE — Telephone Encounter (Addendum)
REVIEWED. PT NEED CIP/FLAGYL FOR 14 DAYS. PT SHOULD CALL TO FOLLOW UP WITH SURGERY IN [place] OR [place]. NO INDICATION FOR COLONOSCOPY AT THIS TIME.   PT HAS KNOWN HISTORY OF SIGMOID DIVERTICULITIS AND RECOMMENDED SURGERY IN 2016. IF NEEDED COULD PERFORM FLEX SIG.  DO NOT TAKE ASPIRIN OR NSAIDS.

## 2017-04-11 NOTE — Telephone Encounter (Signed)
Was able to contact patient. She is having 5/10 pain. Drinking Gatorade to stay hydrated. Not eating much but keeping fluids down. Discussed acute diverticulitis. Rx sent to pharmacy for Cipro/Flagyl for 7 days as well as Hydrocodone/APAP 5-325 q 6 hours for moderate to severe pain with a 5 day supply.  She does not feel like she needs to go to the ER at this time. No fever, no vomiting. Discussed if she has worsening symptoms to proceed to the ER (ER precautions). Start antibiotics today.  Will have her scheduled for a followup OV in 4 weeks.

## 2017-04-11 NOTE — Telephone Encounter (Signed)
Called central scheduling and was advised patient needs to come now as she needs to drink the contrast. Also per Vivien Rota in central scheduling we need to order a pregnancy test for pt have prior to CT to ensure patient is not pregnant.  Order place.  Called patient and she will head over to [facility] now. Nothing further needed

## 2017-04-11 NOTE — Addendum Note (Signed)
Addended by: Danie Binder on: 04/11/2017 09:04 PM   Modules accepted: Orders

## 2017-04-11 NOTE — Progress Notes (Signed)
hcg 

## 2017-04-11 NOTE — Telephone Encounter (Signed)
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  No gas noted in the urinary bladder to suggest the fistula has extended into the urinary bladder lumen.  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  Additionally, the patient appears to have sigmoid diverticulitis.  Attempted to call the patient to relay these results, check her status, and send in a prescription of Cipro and Flagyl for diverticulitis versus recommend progression to the ER, depending on her symptoms.  However, I received a voicemail and left a message.  We will continue to try throughout the day to reach the patient.  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

## 2017-04-13 NOTE — Telephone Encounter (Signed)
Called patient TO DISCUSS PLAN.LVM WITH PLAN.

## 2017-04-14 ENCOUNTER — Other Ambulatory Visit: Payer: Self-pay

## 2017-04-14 ENCOUNTER — Emergency Department (HOSPITAL_COMMUNITY): Payer: BLUE CROSS/BLUE SHIELD

## 2017-04-14 ENCOUNTER — Inpatient Hospital Stay (HOSPITAL_COMMUNITY)
Admission: EM | Admit: 2017-04-14 | Discharge: 2017-04-19 | DRG: 392 | Disposition: A | Discharge status: Home / Self Care | Payer: BLUE CROSS/BLUE SHIELD | Attending: Internal Medicine | Admitting: Internal Medicine

## 2017-04-14 ENCOUNTER — Encounter (HOSPITAL_COMMUNITY): Payer: Self-pay | Admitting: Emergency Medicine

## 2017-04-14 DIAGNOSIS — D509 Iron deficiency anemia, unspecified: Secondary | ICD-10-CM | POA: Diagnosis present

## 2017-04-14 DIAGNOSIS — Z79899 Other long term (current) drug therapy: Secondary | ICD-10-CM

## 2017-04-14 DIAGNOSIS — K5732 Diverticulitis of large intestine without perforation or abscess without bleeding: Principal | ICD-10-CM | POA: Diagnosis present

## 2017-04-14 DIAGNOSIS — Z6841 Body Mass Index (BMI) 40.0 and over, adult: Secondary | ICD-10-CM

## 2017-04-14 DIAGNOSIS — K5792 Diverticulitis of intestine, part unspecified, without perforation or abscess without bleeding: Secondary | ICD-10-CM | POA: Diagnosis present

## 2017-04-14 DIAGNOSIS — R600 Localized edema: Secondary | ICD-10-CM | POA: Diagnosis not present

## 2017-04-14 DIAGNOSIS — N321 Vesicointestinal fistula: Secondary | ICD-10-CM | POA: Diagnosis not present

## 2017-04-14 DIAGNOSIS — Z91018 Allergy to other foods: Secondary | ICD-10-CM | POA: Diagnosis not present

## 2017-04-14 DIAGNOSIS — R1030 Lower abdominal pain, unspecified: Secondary | ICD-10-CM | POA: Diagnosis not present

## 2017-04-14 DIAGNOSIS — K219 Gastro-esophageal reflux disease without esophagitis: Secondary | ICD-10-CM | POA: Diagnosis not present

## 2017-04-14 DIAGNOSIS — R109 Unspecified abdominal pain: Secondary | ICD-10-CM | POA: Diagnosis not present

## 2017-04-14 HISTORY — DX: Iron deficiency anemia, unspecified: D50.9

## 2017-04-14 LAB — COMPREHENSIVE METABOLIC PANEL
ALK PHOS: 62 U/L (ref 38–126)
ALT: 20 U/L (ref 14–54)
AST: 24 U/L (ref 15–41)
Albumin: 3.4 g/dL — ABNORMAL LOW (ref 3.5–5.0)
Anion gap: 8 (ref 5–15)
BUN: 11 mg/dL (ref 6–20)
CALCIUM: 8.7 mg/dL — AB (ref 8.9–10.3)
CO2: 24 mmol/L (ref 22–32)
CREATININE: 0.83 mg/dL (ref 0.44–1.00)
Chloride: 107 mmol/L (ref 101–111)
GFR calc non Af Amer: >60 mL/min (ref >60)
Glucose, Bld: 124 mg/dL — ABNORMAL HIGH (ref 65–99)
Potassium: 4 mmol/L (ref 3.5–5.1)
Sodium: 139 mmol/L (ref 135–145)
Total Bilirubin: 0.2 mg/dL — ABNORMAL LOW (ref 0.3–1.2)
Total Protein: 7.4 g/dL (ref 6.5–8.1)

## 2017-04-14 LAB — CBC
HCT: 32.6 % — ABNORMAL LOW (ref 36.0–46.0)
Hemoglobin: 9.1 g/dL — ABNORMAL LOW (ref 12.0–15.0)
MCH: 20.4 pg — AB (ref 26.0–34.0)
MCHC: 27.9 g/dL — ABNORMAL LOW (ref 30.0–36.0)
MCV: 73.1 fL — ABNORMAL LOW (ref 78.0–100.0)
PLATELETS: 307 10*3/uL (ref 150–400)
RBC: 4.46 MIL/uL (ref 3.87–5.11)
RDW: 19.3 % — ABNORMAL HIGH (ref 11.5–15.5)
WBC: 8.3 10*3/uL (ref 4.0–10.5)

## 2017-04-14 LAB — LIPASE, BLOOD: Lipase: 25 U/L (ref 11–51)

## 2017-04-14 MED ORDER — HYDROMORPHONE HCL 1 MG/ML IJ SOLN
1.0000 mg | Freq: Once | INTRAMUSCULAR | Status: AC
  Administered 2017-04-14: 1 mg via INTRAVENOUS
  Filled 2017-04-14: qty 1

## 2017-04-14 MED ORDER — ONDANSETRON HCL 4 MG/2ML IJ SOLN
4.0000 mg | Freq: Once | INTRAMUSCULAR | Status: AC
  Administered 2017-04-15: 4 mg via INTRAVENOUS
  Filled 2017-04-14: qty 2

## 2017-04-14 MED ORDER — SODIUM CHLORIDE 0.9 % IV BOLUS (SEPSIS)
1000.0000 mL | Freq: Once | INTRAVENOUS | Status: AC
  Administered 2017-04-14: 1000 mL via INTRAVENOUS

## 2017-04-14 MED ORDER — ONDANSETRON HCL 4 MG/2ML IJ SOLN
4.0000 mg | Freq: Once | INTRAMUSCULAR | Status: AC
  Administered 2017-04-14: 4 mg via INTRAVENOUS
  Filled 2017-04-14: qty 2

## 2017-04-14 MED ORDER — METRONIDAZOLE IN NACL 5-0.79 MG/ML-% IV SOLN
500.0000 mg | Freq: Once | INTRAVENOUS | Status: AC
  Administered 2017-04-15: 500 mg via INTRAVENOUS
  Filled 2017-04-14: qty 100

## 2017-04-14 MED ORDER — CIPROFLOXACIN IN D5W 400 MG/200ML IV SOLN
400.0000 mg | Freq: Once | INTRAVENOUS | Status: AC
  Administered 2017-04-14: 400 mg via INTRAVENOUS
  Filled 2017-04-14: qty 200

## 2017-04-14 NOTE — Telephone Encounter (Signed)
Where can I put the patient for a 4 week follow up, she see's slf in the office.

## 2017-04-14 NOTE — Telephone Encounter (Addendum)
Pt called office with update. She started antibiotics Friday, but isn't feeling better. Rates pain 5-6. She is currently at work. Unable to sleep last night d/t pain. Has been taking Alka Seltzer for reflux. Had fever of 101 last night. Has been nauseated. Nausea got worse last night-took 2 pills for nausea. Hasn't been eating d/t food makes her feel worse. Drinking fluids. States if she doesn't get better she will go to ER. Advised her to go to ER is worsens and I would let SLF know.   Also routing to AB as she is currently in the office.

## 2017-04-14 NOTE — Telephone Encounter (Signed)
Don't take Alka seltzer, avoid Ibuprofen, Advil, Aleve, etc. Sounds like she is failing outpatient management. Would benefit from ED evaluation.

## 2017-04-14 NOTE — ED Notes (Signed)
Okay per Fritz Pickerel, in [place] and Dr Lacinda Axon if pt goes to xray without urine pregnancy as she was here 3 days ago and had negative urine preg.

## 2017-04-14 NOTE — ED Triage Notes (Signed)
Pt c/o lower abd pain with nausea. Pt states she was diagnosed with diverticulitis and is on pain meds and antibiotic for the same with no relief. Pt was told by Dr. Oneida Alar to come to the ED.

## 2017-04-14 NOTE — Telephone Encounter (Signed)
Tried to call pt, no answer, LMOVM and informed pt of AB's recommendation. Called pt's emergency contact (husband). Informed him that pt should go to ER. States he will give her the message.

## 2017-04-14 NOTE — ED Notes (Signed)
Patient transported to X-ray 

## 2017-04-14 NOTE — Telephone Encounter (Signed)
There's an opening on 2/27 at 9 am with SF. Can we use that slot?

## 2017-04-14 NOTE — ED Notes (Signed)
Pt c/o nausea- new orders received

## 2017-04-14 NOTE — H&P (Addendum)
4        History and Physical    Marilyn Rivera ZDG:[id number] DOB: May 16, 1980 DOA: 04/14/2017  PCP: Patient, No Pcp Per   Patient coming from: Home.  I have personally briefly reviewed patient's old medical records in [place]  Chief Complaint: Abdominal pain and nausea.  HPI: Marilyn Rivera is a 37 y.o. female with medical history significant of diverticulosis, history of diverticulitis in the past, GERD, H. pylori gastritis, urolithiasis who is returning to the emergency department 3 days later due to abdominal pain, nausea and one episode of emesis.    The patient was seen on Friday and was diagnosed with acute diverticulitis of the sigmoid colon.  She was given a trial of oral antibiotics and oral symptomatic treatment at home, but has not improved.  She called Dr. Trinda Pascal, who is her gastroenterologist, and was advised to come to the emergency department.  She mentions she had a fever of 101 F and night sweats last night. She complains of chronic dyspnea, postural lightheadedness and occasional heavy menses.  She states that she has been having increased pain on her left lower quadrant and suprapubic area with bowel movements and urination.  Denies diarrhea, constipation melena, hematochezia, polyuria, polydipsia or blurred vision.   ED Course: Initial vital signs temperature 98.37F, pulse 81, respirations 20, blood pressure 167/128 and O2 sat 100% on room air.  Her urinalysis and pregnancy test were negative.  White count was 8.3, hemoglobin 9.1 g/dL and platelets 307.  Lipase was normal at 25.  Her CMP shows a glucose of 124, calcium of 8.7 mg/dL, which is normal when corrected to a low albumin of 3.4 g/dL.  Her total bilirubin was 0.2 mg/dL.  All other CMP values are normal.  The patient received Flagyl 500 mg IVPB, hydromorphone 1 mg IVP x1, Zofran 4 mg IVP x2 doses and ciprofloxacin 400 mg IVPB.  Imaging: Acute abdomen with chest on 04/14/2017 IMPRESSION: 1. No  free air to suggest perforation.  No bowel obstruction. 2. Air to the left of the sigmoid colon may be the subserosal bladder air is seen on CT versus adjacent bowel gas. 3. Enteric contrast in the descending colon from prior CT. 4. Clear lungs.   CT abdomen/pelvis with contrast on 04/11/2017 IMPRESSION: 1. Inflamed, thick-walled cavity along the left serosal margin of the urinary bladder contains gas and fluid. There is a fistulous connection between this cavity and the sigmoid colon, along with wall thickening, diverticulosis, and likely diverticulitis of the sigmoid colon. Prominent wall thickening of the adjacent urinary bladder, some of which may be reactive; there is no current gas in the urinary bladder to suggest fistula into the urinary bladder lumen, although correlation with any pyuria is recommended. 2. Please note that the inflamed segment of sigmoid colon is similar to the prior exam of June 2018. While this is probably fromdiverticulitis, an underlying sigmoid colon tumor is not totally excluded. Given the patient's age, colon cancer seems less likely but should be considered as a remote possibility. 3. Lower lumbar impingement due to spondylosis and degenerative disc disease.  Review of Systems: As per HPI otherwise 10 point review of systems negative.    Past Medical History:  Diagnosis Date  . Diverticulitis large intestine w/o perforation or abscess w/o bleeding 10/05/2014  . Diverticulosis   . GERD (gastroesophageal reflux disease)   . Helicobacter pylori gastritis 09/06/2014  . Kidney stones     Past Surgical History:  Procedure  Laterality Date  . COLONOSCOPY N/A 06/10/2014   Dr. Oneida Alar; redundant sigmoid colon, moderate diverticulosis in the sigmoid and descending colon. small internal hemorrhoids. Next screening at age 74.   Marland Kitchen ESOPHAGOGASTRODUODENOSCOPY N/A 06/10/2014   Dr. Oneida Alar: H.pylori gastritis s/p treatment with Amoxicillin and Biaxin  . KNEE SURGERY Right   .  renal calculi removal Left 2010  . SKIN LESION EXCISION     over right eyebrow due to wax being left above eye and it seeped down into pore     reports that  has never smoked. she has never used smokeless tobacco. She reports that she does not drink alcohol or use drugs.  Allergies  Allergen Reactions  . Coconut Fatty Acids     Family History  Problem Relation Age of Onset  . Heart failure Mother   . Cancer Other        mother's side of family, breast cancer  . Cancer Other        father's side of family, leukemia  . Hypertension Father   . Diabetes Other   . Hypertension Other   . Colon cancer Neg Hx     Prior to Admission medications   Medication Sig Start Date End Date Taking? Authorizing Provider  ciprofloxacin (CIPRO) 500 MG tablet Take 1 tablet (500 mg total) by mouth 2 (two) times daily for 7 days. 04/11/17 04/18/17 Yes Fields, Marga Melnick, MD  HYDROcodone-acetaminophen (NORCO/VICODIN) 5-325 MG tablet Take 1 tablet by mouth every 6 (six) hours as needed for moderate pain or severe pain. 04/11/17  Yes Carlis Stable, NP  lactulose (CHRONULAC) 10 GM/15ML solution Take 15 mLs (10 g total) by mouth 3 (three) times daily as needed for moderate constipation. 04/03/17  Yes Daleen Bo, MD  metroNIDAZOLE (FLAGYL) 500 MG tablet Take 1 tablet (500 mg total) by mouth 3 (three) times daily for 7 days. 04/11/17 04/18/17 Yes Fields, Sandi L, MD  omeprazole (PRILOSEC) 20 MG capsule TAKE (1) CAPSULE DAILY BEFORE BREAKFAST. 12/20/16  Yes Carlis Stable, NP  oseltamivir (TAMIFLU) 75 MG capsule Take 1 capsule (75 mg total) by mouth every 12 (twelve) hours. Patient not taking: Reported on 04/14/2017 04/03/17   Daleen Bo, MD    Physical Exam: Vitals:   04/14/17 2130 04/14/17 2200 04/14/17 2230 04/14/17 2300  BP: 132/82 (!) 145/72 136/63 133/79  Pulse: 74 78 82 80  Resp: 20 17 17 17   Temp:      SpO2: 98% 98% 94% 97%  Weight:      Height:        Constitutional: NAD, calm, comfortable Eyes:  PERRL, lids and conjunctivae normal ENMT: Mucous membranes are moist. Posterior pharynx clear of any exudate or lesions. Neck: normal, supple, no masses, no thyromegaly Respiratory: clear to auscultation bilaterally, no wheezing, no crackles. Normal respiratory effort. No accessory muscle use.  Cardiovascular: Regular rate and rhythm, no murmurs / rubs / gallops. No pitting extremity edema.  Positive stage II bilateral lymphedema.  2+ pedal pulses. No carotid bruits.  Abdomen: Obese, soft, positive LLQ tenderness, no guarding/rebound/masses palpated. No hepatosplenomegaly. Bowel sounds positive.  Musculoskeletal: no clubbing / cyanosis. Good ROM, no contractures. Normal muscle tone.  Skin: no significant rashes, lesions, ulcers on limited skin examination. Neurologic: CN 2-12 grossly intact. Sensation intact, DTR normal. Strength 5/5 in all 4.  Psychiatric: Normal judgment and insight. Alert and oriented x 4. Normal mood.    Labs on Admission: I have personally reviewed following labs and imaging studies  CBC:  Recent Labs  Lab 04/14/17 2019  WBC 8.3  HGB 9.1*  HCT 32.6*  MCV 73.1*  PLT 852   Basic Metabolic Panel: Recent Labs  Lab 04/14/17 2019  NA 139  K 4.0  CL 107  CO2 24  GLUCOSE 124*  BUN 11  CREATININE 0.83  CALCIUM 8.7*   GFR: Estimated Creatinine Clearance: 163.9 mL/min (by C-G formula based on SCr of 0.83 mg/dL). Liver Function Tests: Recent Labs  Lab 04/14/17 2019  AST 24  ALT 20  ALKPHOS 62  BILITOT 0.2*  PROT 7.4  ALBUMIN 3.4*   Recent Labs  Lab 04/14/17 2019  LIPASE 25   No results for input(s): AMMONIA in the last 168 hours. Coagulation Profile: No results for input(s): INR, PROTIME in the last 168 hours. Cardiac Enzymes: No results for input(s): CKTOTAL, CKMB, CKMBINDEX, TROPONINI in the last 168 hours. BNP (last 3 results) No results for input(s): PROBNP in the last 8760 hours. HbA1C: No results for input(s): HGBA1C in the last 72  hours. CBG: No results for input(s): GLUCAP in the last 168 hours. Lipid Profile: No results for input(s): CHOL, HDL, LDLCALC, TRIG, CHOLHDL, LDLDIRECT in the last 72 hours. Thyroid Function Tests: No results for input(s): TSH, T4TOTAL, FREET4, T3FREE, THYROIDAB in the last 72 hours. Anemia Panel: No results for input(s): VITAMINB12, FOLATE, FERRITIN, TIBC, IRON, RETICCTPCT in the last 72 hours. Urine analysis:    Component Value Date/Time   COLORURINE YELLOW 08/06/2016 1843   APPEARANCEUR CLEAR 08/06/2016 1843   LABSPEC 1.019 08/06/2016 1843   PHURINE 5.0 08/06/2016 1843   GLUCOSEU NEGATIVE 08/06/2016 1843   HGBUR LARGE (A) 08/06/2016 1843   BILIRUBINUR NEGATIVE 08/06/2016 1843   KETONESUR NEGATIVE 08/06/2016 1843   PROTEINUR 30 (A) 08/06/2016 1843   UROBILINOGEN 0.2 10/05/2014 0028   NITRITE NEGATIVE 08/06/2016 1843   LEUKOCYTESUR TRACE (A) 08/06/2016 1843    Radiological Exams on Admission: Dg Abdomen Acute W/chest  Result Date: 04/14/2017 CLINICAL DATA:  Left lower abdominal pain and nausea. EXAM: DG ABDOMEN ACUTE W/ 1V CHEST COMPARISON:  CT 02/08/2018 FINDINGS: The cardiomediastinal contours are normal. The lungs are clear. There is no free intra-abdominal air. There is enteric contrast throughout the colon from prior CT. Moderate stool in the more proximal colon. No bowel dilatation to suggest obstruction. Small focus of air to the left of the rectosigmoid colon may reflect the subserosal bladder air as seen on CT versus normal bowel gas. No radiopaque calculi. No acute osseous abnormalities are seen. IMPRESSION: 1. No free air to suggest perforation.  No bowel obstruction. 2. Air to the left of the sigmoid colon may be the subserosal bladder air is seen on CT versus adjacent bowel gas. 3. Enteric contrast in the descending colon from prior CT. 4. Clear lungs. Electronically Signed   By: Jeb Levering M.D.   On: 04/14/2017 23:52    EKG: Independently reviewed.    Assessment/Plan Principal Problem:   Acute diverticulitis Admit to MedSurg/inpatient. N.p.o. for now except for ice chips. Gentle IV fluids. Hydromorphone 1 mg every 3 hours as needed for pain. Zofran 4 mg every 6 hours as needed for nausea/vomiting. Continue ciprofloxacin 400 mg IV  every 12 hours or per pharmacy. Continue metronidazole 500 mg IVPB every 8 hours or per pharmacy dosing recommendations given pharmacological volume of distribution. Routine general surgery consult for evaluation of diverticulitis and possible colovesical fistula.  Active Problems:   GERD (gastroesophageal reflux disease) Pantoprazole 40 mg IVP every 24 hours.  Microcytic anemia This is a chronic problem. It may be worsened by very high BMI induced hyperestrogenism  ferritin and iron have been low before. I suggested the patient to start taking iron supplements, once diverticulitis episode resolved.  Monitor hematocrit and hemoglobin while in the hospital.   DVT prophylaxis: Heparin SQ. Code Status: Full code. Family Communication:  Disposition Plan: Admit for IV antibiotic therapy for 2-3 days. Consults called: General surgery. Admission status: Inpatient/MedSurg.   Reubin Milan MD [facility] Pager [phone].  If 7PM-7AM, please contact night-coverage [email or web address] Password [facility]  04/14/2017, 11:55 PM

## 2017-04-14 NOTE — ED Notes (Signed)
Informed pt we need urine sample. Pt states she cannot provide at this time. Will check back.

## 2017-04-15 ENCOUNTER — Encounter: Payer: Self-pay | Admitting: Gastroenterology

## 2017-04-15 ENCOUNTER — Encounter (HOSPITAL_COMMUNITY): Payer: Self-pay

## 2017-04-15 ENCOUNTER — Inpatient Hospital Stay (HOSPITAL_COMMUNITY): Payer: BLUE CROSS/BLUE SHIELD

## 2017-04-15 ENCOUNTER — Other Ambulatory Visit: Payer: Self-pay

## 2017-04-15 DIAGNOSIS — D509 Iron deficiency anemia, unspecified: Secondary | ICD-10-CM | POA: Diagnosis present

## 2017-04-15 DIAGNOSIS — K5792 Diverticulitis of intestine, part unspecified, without perforation or abscess without bleeding: Secondary | ICD-10-CM

## 2017-04-15 DIAGNOSIS — N321 Vesicointestinal fistula: Secondary | ICD-10-CM

## 2017-04-15 LAB — COMPREHENSIVE METABOLIC PANEL
ALT: 20 U/L (ref 14–54)
AST: 24 U/L (ref 15–41)
Albumin: 3 g/dL — ABNORMAL LOW (ref 3.5–5.0)
Alkaline Phosphatase: 51 U/L (ref 38–126)
Anion gap: 7 (ref 5–15)
BILIRUBIN TOTAL: 0.4 mg/dL (ref 0.3–1.2)
BUN: 10 mg/dL (ref 6–20)
CO2: 24 mmol/L (ref 22–32)
CREATININE: 0.84 mg/dL (ref 0.44–1.00)
Calcium: 8.3 mg/dL — ABNORMAL LOW (ref 8.9–10.3)
Chloride: 107 mmol/L (ref 101–111)
GFR calc Af Amer: >60 mL/min (ref >60)
Glucose, Bld: 101 mg/dL — ABNORMAL HIGH (ref 65–99)
Potassium: 4 mmol/L (ref 3.5–5.1)
Sodium: 138 mmol/L (ref 135–145)
TOTAL PROTEIN: 6.6 g/dL (ref 6.5–8.1)

## 2017-04-15 LAB — URINALYSIS, ROUTINE W REFLEX MICROSCOPIC
Bilirubin Urine: NEGATIVE
GLUCOSE, UA: NEGATIVE mg/dL
Hgb urine dipstick: NEGATIVE
KETONES UR: NEGATIVE mg/dL
LEUKOCYTES UA: NEGATIVE
NITRITE: NEGATIVE
PH: 6 (ref 5.0–8.0)
Protein, ur: NEGATIVE mg/dL
SPECIFIC GRAVITY, URINE: 1.024 (ref 1.005–1.030)

## 2017-04-15 LAB — CBC WITH DIFFERENTIAL/PLATELET
BASOS ABS: 0 10*3/uL (ref 0.0–0.1)
Basophils Relative: 1 %
Eosinophils Absolute: 0.1 10*3/uL (ref 0.0–0.7)
Eosinophils Relative: 1 %
HEMATOCRIT: 29.2 % — AB (ref 36.0–46.0)
Hemoglobin: 8.3 g/dL — ABNORMAL LOW (ref 12.0–15.0)
LYMPHS ABS: 3.4 10*3/uL (ref 0.7–4.0)
LYMPHS PCT: 46 %
MCH: 20.8 pg — AB (ref 26.0–34.0)
MCHC: 28.4 g/dL — ABNORMAL LOW (ref 30.0–36.0)
MCV: 73 fL — AB (ref 78.0–100.0)
Monocytes Absolute: 0.7 10*3/uL (ref 0.1–1.0)
Monocytes Relative: 10 %
NEUTROS ABS: 3.1 10*3/uL (ref 1.7–7.7)
Neutrophils Relative %: 42 %
Platelets: 265 10*3/uL (ref 150–400)
RBC: 4 MIL/uL (ref 3.87–5.11)
RDW: 19.4 % — AB (ref 11.5–15.5)
WBC: 7.4 10*3/uL (ref 4.0–10.5)

## 2017-04-15 LAB — PREGNANCY, URINE: Preg Test, Ur: NEGATIVE

## 2017-04-15 MED ORDER — SODIUM CHLORIDE 0.9 % IV SOLN
INTRAVENOUS | Status: DC
  Administered 2017-04-15 – 2017-04-18 (×4): via INTRAVENOUS

## 2017-04-15 MED ORDER — ONDANSETRON HCL 4 MG/2ML IJ SOLN
4.0000 mg | Freq: Four times a day (QID) | INTRAMUSCULAR | Status: DC | PRN
  Administered 2017-04-15 – 2017-04-18 (×8): 4 mg via INTRAVENOUS
  Filled 2017-04-15 (×9): qty 2

## 2017-04-15 MED ORDER — METRONIDAZOLE IN NACL 5-0.79 MG/ML-% IV SOLN
500.0000 mg | Freq: Three times a day (TID) | INTRAVENOUS | Status: DC
  Administered 2017-04-15 – 2017-04-19 (×13): 500 mg via INTRAVENOUS
  Filled 2017-04-15 (×12): qty 100

## 2017-04-15 MED ORDER — DIPHENHYDRAMINE HCL 50 MG/ML IJ SOLN
50.0000 mg | Freq: Four times a day (QID) | INTRAMUSCULAR | Status: DC | PRN
  Administered 2017-04-15: 50 mg via INTRAVENOUS
  Filled 2017-04-15: qty 1

## 2017-04-15 MED ORDER — ACETAMINOPHEN 325 MG PO TABS: 650.0000 mg | ORAL_TABLET | Freq: Four times a day (QID) | ORAL | Status: DC | PRN

## 2017-04-15 MED ORDER — CIPROFLOXACIN IN D5W 400 MG/200ML IV SOLN
400.0000 mg | Freq: Two times a day (BID) | INTRAVENOUS | Status: DC
  Administered 2017-04-15 – 2017-04-19 (×9): 400 mg via INTRAVENOUS
  Filled 2017-04-15 (×11): qty 200

## 2017-04-15 MED ORDER — PANTOPRAZOLE SODIUM 40 MG IV SOLR
40.0000 mg | INTRAVENOUS | Status: DC
  Administered 2017-04-15 – 2017-04-18 (×5): 40 mg via INTRAVENOUS
  Filled 2017-04-15 (×5): qty 40

## 2017-04-15 MED ORDER — ACETAMINOPHEN 650 MG RE SUPP: 650.0000 mg | Freq: Four times a day (QID) | RECTAL | Status: DC | PRN

## 2017-04-15 MED ORDER — HEPARIN SODIUM (PORCINE) 5000 UNIT/ML IJ SOLN
5000.0000 [IU] | Freq: Three times a day (TID) | INTRAMUSCULAR | Status: DC
  Administered 2017-04-15 – 2017-04-19 (×11): 5000 [IU] via SUBCUTANEOUS
  Filled 2017-04-15 (×11): qty 1

## 2017-04-15 MED ORDER — HYDROMORPHONE HCL 1 MG/ML IJ SOLN
1.0000 mg | INTRAMUSCULAR | Status: DC | PRN
  Administered 2017-04-15 – 2017-04-18 (×27): 1 mg via INTRAVENOUS
  Filled 2017-04-15 (×26): qty 1

## 2017-04-15 MED ORDER — ONDANSETRON HCL 4 MG PO TABS
4.0000 mg | ORAL_TABLET | Freq: Four times a day (QID) | ORAL | Status: DC | PRN
  Administered 2017-04-18 – 2017-04-19 (×2): 4 mg via ORAL
  Filled 2017-04-15 (×2): qty 1

## 2017-04-15 MED ORDER — BOOST / RESOURCE BREEZE PO LIQD CUSTOM
1.0000 | Freq: Two times a day (BID) | ORAL | Status: DC
  Administered 2017-04-15 – 2017-04-16 (×2): 1 via ORAL

## 2017-04-15 MED ORDER — HYDROMORPHONE HCL 1 MG/ML IJ SOLN
INTRAMUSCULAR | Status: AC
  Filled 2017-04-15: qty 1

## 2017-04-15 MED ORDER — KETOROLAC TROMETHAMINE 30 MG/ML IJ SOLN
30.0000 mg | Freq: Once | INTRAMUSCULAR | Status: AC
  Administered 2017-04-15: 30 mg via INTRAVENOUS
  Filled 2017-04-15: qty 1

## 2017-04-15 MED ORDER — PRO-STAT SUGAR FREE PO LIQD
30.0000 mL | Freq: Three times a day (TID) | ORAL | Status: DC
  Administered 2017-04-15 – 2017-04-18 (×3): 30 mL via ORAL
  Filled 2017-04-15 (×7): qty 30

## 2017-04-15 NOTE — Telephone Encounter (Signed)
Pt only sees Dr.Fields in the office. When can she be seen for follow up?

## 2017-04-15 NOTE — Telephone Encounter (Signed)
Pt admitted FEB 18.

## 2017-04-15 NOTE — Progress Notes (Signed)
Pt c/o "itching all over." Dr. Olevia Bowens paged and made aware. Waiting for orders/call back.

## 2017-04-15 NOTE — Telephone Encounter (Signed)
Forwarding to [place] to make the appt.

## 2017-04-15 NOTE — Progress Notes (Signed)
Initial Nutrition Assessment  DOCUMENTATION CODES:  Morbid obesity  INTERVENTION:  Boost Breeze po BID, each supplement provides 250 kcal and 9 grams of protein  Will order 30 mL Prostat TID, each supplement provides 100 kcal and 15 grams of protein.  NUTRITION DIAGNOSIS:  Inadequate oral intake related to acute illness, inability to eat(due to Postprandial pain) as evidenced by an estimated energy intake that has met </= to 50% of kcal/pro needs for approximately 1.5 weeks.   GOAL:  Patient will meet greater than or equal to 90% of their needs  MONITOR:  PO intake, Supplement acceptance, Labs, Diet advancement, Weight trends, I & O's  REASON FOR ASSESSMENT:  Malnutrition Screening Tool    ASSESSMENT:  38 y/o female PMHx morbid obesity, GERD, IDA, Diverticulosis/itis. Had CT scan 2/15 d/t low abdominal pain which showed probably sigmoid diverticulitis w/ fistula tract, potentially colovesical. Presented to ED with poor intake and ongoing severe abdominal pain refractory to outpatient abx/pain management. Admitted for management.   Patient seen alone in room with lights off. Reports ongoing severe pain. She says this pain has been going on for approximately 1.5 weeks. Intake during this time has consisted mostly of clear liquids, soups, and, rarely, would try other "basic items" such as grilled chicken breast and mashed potatoes. She did not take vitamins/supplements.   She says her UBW is 397 lbs. She reports that she has been actively trying to lose weight through an "exercise/diet regimen" when this occurred. She reports losing 16 lbs while being on it, though no recent wt hx to corroborate this.  Separate from her diet/excercise regimen. She reports an unintentional wt loss of 5 lbs in the last 1.5 weeks. Chart shows a loss of 2.4 lbs in approximately 1.5 weeks. This is <1% bw and not clinically significant for the time frame. Noted long term she has had significant weight gain, up  ~60 lbs in the past few years.   At this time, pt has no appetite. She does not think she would eat even if diet is advanced. RD reviewed importance of nutrition in acute state. She was agreeable to trying to drink supplements. Per surgeon's note, patient cleared for CL diet. Will order Breeze/Prostat.   Physical exam: Morbidly obese  Labs: BG: 100-125 mg, H/H:8.3/29.2, Albumin:3.0  Meds: PPI, IV abx, ivf, PRN Dilaudid  Recent Labs  Lab 04/14/17 2019 04/15/17 0541  NA 139 138  K 4.0 4.0  CL 107 107  CO2 24 24  BUN 11 10  CREATININE 0.83 0.84  CALCIUM 8.7* 8.3*  GLUCOSE 124* 101*   NUTRITION - FOCUSED PHYSICAL EXAM: WDL Morbidly Obese  Diet Order:  Diet NPO time specified  EDUCATION NEEDS:  No education needs have been identified at this time  Skin:  Skin Assessment: Reviewed RN Assessment  Last BM:  Unknown  Height:  Ht Readings from Last 1 Encounters:  04/15/17 '5\' 9"'  (1.753 m)   Weight:  Wt Readings from Last 1 Encounters:  04/15/17 (!) 393 lb 6.4 oz (178.4 kg)   Wt Readings from Last 10 Encounters:  04/15/17 (!) 393 lb 6.4 oz (178.4 kg)  04/03/17 (!) 396 lb (179.6 kg)  03/06/17 (!) 396 lb 3.2 oz (179.7 kg)  10/22/16 (!) 390 lb (176.9 kg)  07/31/16 (!) 396 lb 14.4 oz (180 kg)  04/24/16 (!) 390 lb 6.4 oz (177.1 kg)  04/19/16 (!) 387 lb (175.5 kg)  11/02/15 (!) 385 lb (174.6 kg)  10/25/15 (!) 395 lb (179.2 kg)  04/05/15 (!) 414 lb 6.4 oz (188 kg)   Ideal Body Weight:  65.91 kg  BMI:  Body mass index is 58.1 kg/m.  Estimated Nutritional Needs:  Kcal:  1650-1850 (25-28 kcal/kg ibw) Protein:  80-95 g (1.2-1.4 g/kg ibw) Fluid:  >1.9 L fluid (1 ml/kcal)  Burtis Junes RD, LDN, CNSC Clinical Nutrition Pager: [phone] 04/15/2017 1:19 PM

## 2017-04-15 NOTE — ED Provider Notes (Signed)
[facility] EMERGENCY DEPARTMENT Provider Note   CSN: [id number] Arrival date & time: 04/14/17  1944     History   Chief Complaint Chief Complaint  Patient presents with  . Abdominal Pain    HPI ELLER SWEIS is a 37 y.o. female.  Persistent left lower quadrant pain for several days.  Status post CT scan of abdomen/pelvis on 04/11/17 which revealed probable sigmoid diverticulitis with a fistula tract from a cavity adjacent to the left bladder connecting to the sigmoid colon.  Patient was Rxed Cipro and Flagyl as an outpatient, but she continued to have pain.  Poor oral intake.  No fever, sweats, chills.  Severity is moderate to severe.  Palpation makes pain worse.      Past Medical History:  Diagnosis Date  . Diverticulitis large intestine w/o perforation or abscess w/o bleeding 10/05/2014  . Diverticulosis   . GERD (gastroesophageal reflux disease)   . Helicobacter pylori gastritis 09/06/2014  . Kidney stones     Patient Active Problem List   Diagnosis Date Noted  . Acute diverticulitis 04/14/2017  . Rectal bleeding 03/06/2017  . Dysphagia   . GERD (gastroesophageal reflux disease) 04/24/2016  . Helicobacter pylori gastritis 09/06/2014  . Normocytic anemia   . Morbid obesity ([place]) 04/27/2014  . UTI (lower urinary tract infection) 04/27/2014    Past Surgical History:  Procedure Laterality Date  . COLONOSCOPY N/A 06/10/2014   Dr. Oneida Alar; redundant sigmoid colon, moderate diverticulosis in the sigmoid and descending colon. small internal hemorrhoids. Next screening at age 60.   Marland Kitchen ESOPHAGOGASTRODUODENOSCOPY N/A 06/10/2014   Dr. Oneida Alar: H.pylori gastritis s/p treatment with Amoxicillin and Biaxin  . KNEE SURGERY Right   . renal calculi removal Left 2010  . SKIN LESION EXCISION     over right eyebrow due to wax being left above eye and it seeped down into pore    OB History    No data available       Home Medications    Prior to Admission medications     Medication Sig Start Date End Date Taking? Authorizing Provider  ciprofloxacin (CIPRO) 500 MG tablet Take 1 tablet (500 mg total) by mouth 2 (two) times daily for 7 days. 04/11/17 04/18/17 Yes Fields, Marga Melnick, MD  HYDROcodone-acetaminophen (NORCO/VICODIN) 5-325 MG tablet Take 1 tablet by mouth every 6 (six) hours as needed for moderate pain or severe pain. 04/11/17  Yes Carlis Stable, NP  lactulose (CHRONULAC) 10 GM/15ML solution Take 15 mLs (10 g total) by mouth 3 (three) times daily as needed for moderate constipation. 04/03/17  Yes Daleen Bo, MD  metroNIDAZOLE (FLAGYL) 500 MG tablet Take 1 tablet (500 mg total) by mouth 3 (three) times daily for 7 days. 04/11/17 04/18/17 Yes Fields, Sandi L, MD  omeprazole (PRILOSEC) 20 MG capsule TAKE (1) CAPSULE DAILY BEFORE BREAKFAST. 12/20/16  Yes Carlis Stable, NP  oseltamivir (TAMIFLU) 75 MG capsule Take 1 capsule (75 mg total) by mouth every 12 (twelve) hours. Patient not taking: Reported on 04/14/2017 04/03/17   Daleen Bo, MD    Family History Family History  Problem Relation Age of Onset  . Heart failure Mother   . Cancer Other        mother's side of family, breast cancer  . Cancer Other        father's side of family, leukemia  . Hypertension Father   . Diabetes Other   . Hypertension Other   . Colon cancer Neg Hx  Social History Social History   Tobacco Use  . Smoking status: Never Smoker  . Smokeless tobacco: Never Used  Substance Use Topics  . Alcohol use: No  . Drug use: No     Allergies   Coconut fatty acids   Review of Systems Review of Systems  All other systems reviewed and are negative.    Physical Exam Updated Vital Signs BP 133/79   Pulse 80   Temp 98.7 F (37.1 C)   Resp 17   Ht 5' 9.5" (1.765 m)   Wt (!) 178.7 kg (394 lb)   LMP 03/24/2017 Comment: per Dr. [name] no pregnancy test wanted, patient signed waiver form.   SpO2 97%   BMI 57.35 kg/m   Physical Exam  Constitutional: She is  oriented to person, place, and time.  Obese, in pain  HENT:  Head: Normocephalic and atraumatic.  Eyes: Conjunctivae are normal.  Neck: Neck supple.  Cardiovascular: Normal rate and regular rhythm.  Pulmonary/Chest: Effort normal and breath sounds normal.  Abdominal: Soft. Bowel sounds are normal.  Tender left lower quadrant.  Musculoskeletal: Normal range of motion.  Neurological: She is alert and oriented to person, place, and time.  Skin: Skin is warm and dry.  Psychiatric: She has a normal mood and affect. Her behavior is normal.  Nursing note and vitals reviewed.    ED Treatments / Results  Labs (all labs ordered are listed, but only abnormal results are displayed) Labs Reviewed  COMPREHENSIVE METABOLIC PANEL - Abnormal; Notable for the following components:      Result Value   Glucose, Bld 124 (*)    Calcium 8.7 (*)    Albumin 3.4 (*)    Total Bilirubin 0.2 (*)    All other components within normal limits  CBC - Abnormal; Notable for the following components:   Hemoglobin 9.1 (*)    HCT 32.6 (*)    MCV 73.1 (*)    MCH 20.4 (*)    MCHC 27.9 (*)    RDW 19.3 (*)    All other components within normal limits  LIPASE, BLOOD  URINALYSIS, ROUTINE W REFLEX MICROSCOPIC  PREGNANCY, URINE    EKG  EKG Interpretation None       Radiology Dg Abdomen Acute W/chest  Result Date: 04/14/2017 CLINICAL DATA:  Left lower abdominal pain and nausea. EXAM: DG ABDOMEN ACUTE W/ 1V CHEST COMPARISON:  CT 02/08/2018 FINDINGS: The cardiomediastinal contours are normal. The lungs are clear. There is no free intra-abdominal air. There is enteric contrast throughout the colon from prior CT. Moderate stool in the more proximal colon. No bowel dilatation to suggest obstruction. Small focus of air to the left of the rectosigmoid colon may reflect the subserosal bladder air as seen on CT versus normal bowel gas. No radiopaque calculi. No acute osseous abnormalities are seen. IMPRESSION: 1. No  free air to suggest perforation.  No bowel obstruction. 2. Air to the left of the sigmoid colon may be the subserosal bladder air is seen on CT versus adjacent bowel gas. 3. Enteric contrast in the descending colon from prior CT. 4. Clear lungs. Electronically Signed   By: Jeb Levering M.D.   On: 04/14/2017 23:52    Procedures Procedures (including critical care time)  Medications Ordered in ED Medications  metroNIDAZOLE (FLAGYL) IVPB 500 mg (not administered)  ondansetron (ZOFRAN) injection 4 mg (not administered)  ondansetron (ZOFRAN) injection 4 mg (4 mg Intravenous Given 04/14/17 2214)  sodium chloride 0.9 % bolus 1,000  mL (0 mLs Intravenous Stopped 04/14/17 2323)  HYDROmorphone (DILAUDID) injection 1 mg (1 mg Intravenous Given 04/14/17 2215)  ciprofloxacin (CIPRO) IVPB 400 mg (0 mg Intravenous Stopped 04/14/17 2323)     Initial Impression / Assessment and Plan / ED Course  I have reviewed the triage vital signs and the nursing notes.  Pertinent labs & imaging results that were available during my care of the patient were reviewed by me and considered in my medical decision making (see chart for details).     Patient presents with left lower quadrant pain and known diverticulitis/fistula.  Will Rx IV fluids, pain meds, IV Cipro, IV Flagyl.  Acute abdominal series reveals no free air.  Will need CT scan if symptoms worsen.  Admit to general medicine.  Final Clinical Impressions(s) / ED Diagnoses   Final diagnoses:  Diverticulitis    ED Discharge Orders    None       [name], MD 04/15/17 0010

## 2017-04-15 NOTE — Progress Notes (Signed)
PROGRESS NOTE  Marilyn Rivera BTD:[id number] DOB: 21-Jan-1981 DOA: 04/14/2017 PCP: Patient, No Pcp Per  HPI/Recap of past 38 hours: 37 year old female past medical history of diverticulosis/diverticulitis and morbid obesity diagnosed with diverticulitis on 2/15 and started on oral antibiotics without improvement. Patient had fever and increasing pain in her left lower quadrant as well as suprapubic area with bowel movements and urination. She came into the emergency room on the evening of 2/18 and a CT scan noted signs consistent with acute diverticulitis, but also area concerning for possible fistula connecting her colon to her bladder. No evidence of sepsis.  other labs normal. Patient started on IV antibiotics. General surgery consulted who recommended at this time given overall stability for conservative treatment of her diverticulitis and then repeat imaging to later confirmed a fistula was indeed present. Patient doing okay, complains of mild nausea and left lower quadrant pain.   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 Gen. surgery following and patient continues to do well, consider repeat imaging at a later point to confirm a fistula present versus inflammation.  Active Problems:   GERD (gastroesophageal reflux disease): Stable Morbid obesity: Patient is criteria BMI greater than 40   Iron deficiency anemia   Microcytic anemia   Code Status:  Full code   Family Communication: Husband to sleep at the bedside   Disposition Plan: Home once diverticulitis resolved    Consultants:  General surgery   Procedures:  None   Antimicrobials:  IV Cipro and Flagyl 2/18-present    DVT prophylaxis:  SCDs    Objective: Vitals:   04/14/17 2300 04/15/17 0010 04/15/17 0038 04/15/17 0433  BP: 133/79 137/78 (!) 148/63 (!)  119/44  Pulse: 80 87 73 60  Resp: 17 17 20 18   Temp:   98.2 F (36.8 C) 98.1 F (36.7 C)  TempSrc:   Oral Oral  SpO2: 97% 96% 100% 100%  Weight:   (!) 178.4 kg (393 lb 6.4 oz)   Height:   5\' 9"  (1.753 m)     Intake/Output Summary (Last 24 hours) at 04/15/2017 1416 Last data filed at 04/15/2017 0835 Gross per 24 hour  Intake 1360 ml  Output 500 ml  Net 860 ml   Filed Weights   04/14/17 1947 04/15/17 0038  Weight: (!) 178.7 kg (394 lb) (!) 178.4 kg (393 lb 6.4 oz)   Body mass index is 58.1 kg/m.  Exam:   General:  Alert and oriented 3, no acute distress  Cardiovascular: Regular rate and rhythm, S1-S2   Respiratory: Clear to auscultation bilaterally, limited due to body habitus   Abdomen: Soft, tender in left lower quadrant, nondistended, hypoactive bowel sounds   Musculoskeletal: No clubbing or cyanosis or edema   Skin: No skin breaks, tears or lesions  Psychiatry: Appropriate, no evidence of psychoses    Data Reviewed: CBC: Recent Labs  Lab 04/14/17 2019 04/15/17 0541  WBC 8.3 7.4  NEUTROABS  --  3.1  HGB 9.1* 8.3*  HCT 32.6* 29.2*  MCV 73.1* 73.0*  PLT 307 106   Basic Metabolic Panel: Recent Labs  Lab 04/14/17 2019 04/15/17 0541  NA 139 138  K 4.0 4.0  CL 107 107  CO2 24 24  GLUCOSE 124* 101*  BUN 11 10  CREATININE 0.83 0.84  CALCIUM 8.7* 8.3*   GFR: Estimated Creatinine Clearance: 160.8 mL/min (by C-G formula based  on SCr of 0.84 mg/dL). Liver Function Tests: Recent Labs  Lab 04/14/17 2019 04/15/17 0541  AST 24 24  ALT 20 20  ALKPHOS 62 51  BILITOT 0.2* 0.4  PROT 7.4 6.6  ALBUMIN 3.4* 3.0*   Recent Labs  Lab 04/14/17 2019  LIPASE 25   No results for input(s): AMMONIA in the last 168 hours. Coagulation Profile: No results for input(s): INR, PROTIME in the last 168 hours. Cardiac Enzymes: No results for input(s): CKTOTAL, CKMB, CKMBINDEX, TROPONINI in the last 168 hours. BNP (last 3 results) No results for input(s): PROBNP  in the last 8760 hours. HbA1C: No results for input(s): HGBA1C in the last 72 hours. CBG: No results for input(s): GLUCAP in the last 168 hours. Lipid Profile: No results for input(s): CHOL, HDL, LDLCALC, TRIG, CHOLHDL, LDLDIRECT in the last 72 hours. Thyroid Function Tests: No results for input(s): TSH, T4TOTAL, FREET4, T3FREE, THYROIDAB in the last 72 hours. Anemia Panel: No results for input(s): VITAMINB12, FOLATE, FERRITIN, TIBC, IRON, RETICCTPCT in the last 72 hours. Urine analysis:    Component Value Date/Time   COLORURINE YELLOW 04/14/2017 1950   APPEARANCEUR CLEAR 04/14/2017 1950   LABSPEC 1.024 04/14/2017 1950   PHURINE 6.0 04/14/2017 1950   GLUCOSEU NEGATIVE 04/14/2017 1950   HGBUR NEGATIVE 04/14/2017 1950   BILIRUBINUR NEGATIVE 04/14/2017 1950   KETONESUR NEGATIVE 04/14/2017 1950   PROTEINUR NEGATIVE 04/14/2017 1950   UROBILINOGEN 0.2 10/05/2014 0028   NITRITE NEGATIVE 04/14/2017 1950   LEUKOCYTESUR NEGATIVE 04/14/2017 1950   Sepsis Labs: @LABRCNTIP (procalcitonin:4,lacticidven:4)  )No results found for this or any previous visit (from the past 240 hour(s)).    Studies: US Venous Img Lower Bilateral  Result Date: 04/15/2017 CLINICAL DATA:  Bilateral lower extremity edema, left greater than right. History of morbid obesity. Evaluate for DVT. EXAM: BILATERAL LOWER EXTREMITY VENOUS DOPPLER ULTRASOUND TECHNIQUE: Gray-scale sonography with graded compression, as well as color Doppler and duplex ultrasound were performed to evaluate the lower extremity deep venous systems from the level of the common femoral vein and including the common femoral, femoral, profunda femoral, popliteal and calf veins including the posterior tibial, peroneal and gastrocnemius veins when visible. The superficial great saphenous vein was also interrogated. Spectral Doppler was utilized to evaluate flow at rest and with distal augmentation maneuvers in the common femoral, femoral and popliteal veins.  COMPARISON:  None. FINDINGS: RIGHT LOWER EXTREMITY Common Femoral Vein: No evidence of thrombus. Normal compressibility, respiratory phasicity and response to augmentation. Saphenofemoral Junction: No evidence of thrombus. Normal compressibility and flow on color Doppler imaging. Profunda Femoral Vein: No evidence of thrombus. Normal compressibility and flow on color Doppler imaging. Femoral Vein: No evidence of thrombus. Normal compressibility, respiratory phasicity and response to augmentation. Popliteal Vein: No evidence of thrombus. Normal compressibility, respiratory phasicity and response to augmentation. Calf Veins: No evidence of thrombus. Normal compressibility and flow on color Doppler imaging. Superficial Great Saphenous Vein: No evidence of thrombus. Normal compressibility. Venous Reflux:  None. Other Findings:  None. LEFT LOWER EXTREMITY Common Femoral Vein: No evidence of thrombus. Normal compressibility, respiratory phasicity and response to augmentation. Saphenofemoral Junction: No evidence of thrombus. Normal compressibility and flow on color Doppler imaging. Profunda Femoral Vein: No evidence of thrombus. Normal compressibility and flow on color Doppler imaging. Femoral Vein: No evidence of thrombus. Normal compressibility, respiratory phasicity and response to augmentation. Popliteal Vein: No evidence of thrombus. Normal compressibility, respiratory phasicity and response to augmentation. Calf Veins: No evidence of thrombus. Normal compressibility and flow on color  Doppler imaging. Superficial Great Saphenous Vein: No evidence of thrombus. Normal compressibility. Venous Reflux:  None. Other Findings:  None. IMPRESSION: No evidence of DVT within either lower extremity. Electronically Signed   By: Sandi Mariscal M.D.   On: 04/15/2017 11:46   Dg Abdomen Acute W/chest  Result Date: 04/14/2017 CLINICAL DATA:  Left lower abdominal pain and nausea. EXAM: DG ABDOMEN ACUTE W/ 1V CHEST COMPARISON:  CT  02/08/2018 FINDINGS: The cardiomediastinal contours are normal. The lungs are clear. There is no free intra-abdominal air. There is enteric contrast throughout the colon from prior CT. Moderate stool in the more proximal colon. No bowel dilatation to suggest obstruction. Small focus of air to the left of the rectosigmoid colon may reflect the subserosal bladder air as seen on CT versus normal bowel gas. No radiopaque calculi. No acute osseous abnormalities are seen. IMPRESSION: 1. No free air to suggest perforation.  No bowel obstruction. 2. Air to the left of the sigmoid colon may be the subserosal bladder air is seen on CT versus adjacent bowel gas. 3. Enteric contrast in the descending colon from prior CT. 4. Clear lungs. Electronically Signed   By: Jeb Levering M.D.   On: 04/14/2017 23:52    Scheduled Meds: . feeding supplement  1 Container Oral BID BM  . feeding supplement (PRO-STAT SUGAR FREE 64)  30 mL Oral TID BM  . heparin  5,000 Units Subcutaneous Q8H  . pantoprazole (PROTONIX) IV  40 mg Intravenous Q24H    Continuous Infusions: . sodium chloride 75 mL/hr at 04/15/17 0107  . ciprofloxacin Stopped (04/15/17 1049)  . metronidazole Stopped (04/15/17 0935)     LOS: 1 day     Annita Brod, MD [facility]  To reach me or the doctor on call, go to: [email or web address] Password [email or web address]  04/15/2017, 2:16 PM

## 2017-04-15 NOTE — Telephone Encounter (Signed)
MAR 20 @1130 .

## 2017-04-15 NOTE — Consult Note (Signed)
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 Maryland Pink, Dr. Oneida Alar   Chief Complaint    Abdominal Pain      Marilyn Rivera is a 37 y.o. female.  HPI: [name] is a 37 yo female with morbid obestiy, GERD, anemia, who presented with an episode of diverticulitis on 04/11/2017 and was sent home from the ED with antibiotics. She has had multiple episodes in the past, multiple which have required hospitilization. She says that she has never had a perforation/ abscess requiring drain.  She had a colonoscopy at 37 yo with Dr. Oneida Alar, and was noted to have diverticulosis and hemorrhoids. She complaining of pain in the LLQ and suprapubic area. She says that she has pain/ pressure when on the toilet trying to urinate or having a BM. She denies any air in her urine or sediment.   Past Medical History:  Diagnosis Date  . Diverticulitis large intestine w/o perforation or abscess w/o bleeding 10/05/2014  . Diverticulosis   . GERD (gastroesophageal reflux disease)   . Helicobacter pylori gastritis 09/06/2014  . Iron deficiency anemia   . Kidney stones     Past Surgical History:  Procedure Laterality Date  . COLONOSCOPY N/A 06/10/2014   Dr. Oneida Alar; redundant sigmoid colon, moderate diverticulosis in the sigmoid and descending colon. small internal hemorrhoids. Next screening at age 40.   Marland Kitchen ESOPHAGOGASTRODUODENOSCOPY N/A 06/10/2014   Dr. Oneida Alar: H.pylori gastritis s/p treatment with Amoxicillin and Biaxin  . KNEE SURGERY Right   . renal calculi removal Left 2010  . SKIN LESION EXCISION     over right eyebrow due to wax being left above eye and it seeped down into pore    Family History  Problem Relation Age of Onset  . Heart failure Mother   . Cancer Other        mother's side of family, breast cancer  . Cancer Other        father's side of family, leukemia  . Hypertension Father   . Stroke Father    . Diabetes Other   . Hypertension Other   . Colon cancer Cousin     Social History   Tobacco Use  . Smoking status: Never Smoker  . Smokeless tobacco: Never Used  Substance Use Topics  . Alcohol use: No  . Drug use: No    Medications:  I have reviewed the patient's current medications. Prior to Admission:  Medications Prior to Admission  Medication Sig Dispense Refill Last Dose  . ciprofloxacin (CIPRO) 500 MG tablet Take 1 tablet (500 mg total) by mouth 2 (two) times daily for 7 days. 14 tablet 0 04/14/2017 at Unknown time  . HYDROcodone-acetaminophen (NORCO/VICODIN) 5-325 MG tablet Take 1 tablet by mouth every 6 (six) hours as needed for moderate pain or severe pain. 15 tablet 0 04/14/2017 at Unknown time  . lactulose (CHRONULAC) 10 GM/15ML solution Take 15 mLs (10 g total) by mouth 3 (three) times daily as needed for moderate constipation. 500 mL 0 Past Week at Unknown time  . metroNIDAZOLE (FLAGYL) 500 MG tablet Take 1 tablet (500 mg total) by mouth 3 (three) times daily for 7 days. 21 tablet 0 04/14/2017 at Unknown time  . omeprazole (PRILOSEC) 20 MG capsule TAKE (1) CAPSULE DAILY BEFORE BREAKFAST. 30 capsule 5 04/14/2017 at Unknown time  . oseltamivir (TAMIFLU) 75 MG capsule Take 1 capsule (75 mg total) by mouth every 12 (twelve) hours. (Patient not taking:  Reported on 04/14/2017) 10 capsule 0 Not Taking at Unknown time   Scheduled: . heparin  5,000 Units Subcutaneous Q8H  . HYDROmorphone      . pantoprazole (PROTONIX) IV  40 mg Intravenous Q24H   Continuous: . sodium chloride 75 mL/hr at 04/15/17 0107  . ciprofloxacin Stopped (04/15/17 1049)  . metronidazole Stopped (04/15/17 0935)   QMV:HQIONGEXBMWUX **OR** acetaminophen, diphenhydrAMINE, HYDROmorphone (DILAUDID) injection, ondansetron **OR** ondansetron (ZOFRAN) IV  Allergies: Allergies  Allergen Reactions  . Coconut Fatty Acids      ROS:  A comprehensive review of systems was negative except for: Constitutional:  positive for fevers Gastrointestinal: positive for abdominal pain, constipation and pelvic pressure/ pain Genitourinary: positive for no sediment or air in urine, but does have pressure in the pelvis  Blood pressure (!) 119/44, pulse 60, temperature 98.1 F (36.7 C), temperature source Oral, resp. rate 18, height '5\' 9"'  (1.753 m), weight (!) 393 lb 6.4 oz (178.4 kg), last menstrual period 03/24/2017, SpO2 100 %. Physical Exam  Results: Results for orders placed or performed during the hospital encounter of 04/14/17 (from the past 48 hour(s))  Urinalysis, Routine w reflex microscopic     Status: None   Collection Time: 04/14/17  7:50 PM  Result Value Ref Range   Color, Urine YELLOW YELLOW   APPearance CLEAR CLEAR   Specific Gravity, Urine 1.024 1.005 - 1.030   pH 6.0 5.0 - 8.0   Glucose, UA NEGATIVE NEGATIVE mg/dL   Hgb urine dipstick NEGATIVE NEGATIVE   Bilirubin Urine NEGATIVE NEGATIVE   Ketones, ur NEGATIVE NEGATIVE mg/dL   Protein, ur NEGATIVE NEGATIVE mg/dL   Nitrite NEGATIVE NEGATIVE   Leukocytes, UA NEGATIVE NEGATIVE    Comment: Performed at [facility], [address], [place], [place] [address]  Pregnancy, urine     Status: None   Collection Time: 04/14/17  7:50 PM  Result Value Ref Range   Preg Test, Ur NEGATIVE NEGATIVE    Comment:        THE SENSITIVITY OF THIS METHODOLOGY IS >20 mIU/mL. Performed at [facility], [address], [place], [place] [address]   Lipase, blood     Status: None   Collection Time: 04/14/17  8:19 PM  Result Value Ref Range   Lipase 25 11 - 51 U/L    Comment: Performed at [facility], [address], [place], [place] [address]  Comprehensive metabolic panel     Status: Abnormal   Collection Time: 04/14/17  8:19 PM  Result Value Ref Range   Sodium 139 135 - 145 mmol/L   Potassium 4.0 3.5 - 5.1 mmol/L   Chloride 107 101 - 111 mmol/L   CO2 24 22 - 32 mmol/L   Glucose, Bld 124 (H) 65 - 99 mg/dL   BUN 11 6 - 20 mg/dL   Creatinine, Ser 0.83  0.44 - 1.00 mg/dL   Calcium 8.7 (L) 8.9 - 10.3 mg/dL   Total Protein 7.4 6.5 - 8.1 g/dL   Albumin 3.4 (L) 3.5 - 5.0 g/dL   AST 24 15 - 41 U/L   ALT 20 14 - 54 U/L   Alkaline Phosphatase 62 38 - 126 U/L   Total Bilirubin 0.2 (L) 0.3 - 1.2 mg/dL   GFR calc non Af Amer >60 >60 mL/min   GFR calc Af Amer >60 >60 mL/min    Comment: (NOTE) The eGFR has been calculated using the CKD EPI equation. This calculation has not been validated in all clinical situations. eGFR's persistently <  60 mL/min signify possible Chronic Kidney Disease.    Anion gap 8 5 - 15    Comment: Performed at [facility], [address] [place], [place] [address]  CBC     Status: Abnormal   Collection Time: 04/14/17  8:19 PM  Result Value Ref Range   WBC 8.3 4.0 - 10.5 K/uL   RBC 4.46 3.87 - 5.11 MIL/uL    Comment: POLYCHROMASIA PRESENT ANISOCYTES    Hemoglobin 9.1 (L) 12.0 - 15.0 g/dL   HCT 32.6 (L) 36.0 - 46.0 %   MCV 73.1 (L) 78.0 - 100.0 fL   MCH 20.4 (L) 26.0 - 34.0 pg   MCHC 27.9 (L) 30.0 - 36.0 g/dL   RDW 19.3 (H) 11.5 - 15.5 %   Platelets 307 150 - 400 K/uL    Comment: SPECIMEN CHECKED FOR CLOTS PLATELET COUNT CONFIRMED BY SMEAR GIANT PLATELETS SEEN Performed at [facility], [address], [place], [place] [address]   CBC WITH DIFFERENTIAL     Status: Abnormal   Collection Time: 04/15/17  5:41 AM  Result Value Ref Range   WBC 7.4 4.0 - 10.5 K/uL   RBC 4.00 3.87 - 5.11 MIL/uL   Hemoglobin 8.3 (L) 12.0 - 15.0 g/dL   HCT 29.2 (L) 36.0 - 46.0 %   MCV 73.0 (L) 78.0 - 100.0 fL   MCH 20.8 (L) 26.0 - 34.0 pg   MCHC 28.4 (L) 30.0 - 36.0 g/dL   RDW 19.4 (H) 11.5 - 15.5 %   Platelets 265 150 - 400 K/uL    Comment: SPECIMEN CHECKED FOR CLOTS PLATELET COUNT CONFIRMED BY SMEAR GIANT PLATELETS SEEN    Neutrophils Relative % 42 %   Neutro Abs 3.1 1.7 - 7.7 K/uL   Lymphocytes Relative 46 %   Lymphs Abs 3.4 0.7 - 4.0 K/uL   Monocytes Relative 10 %   Monocytes Absolute 0.7 0.1 - 1.0 K/uL   Eosinophils  Relative 1 %   Eosinophils Absolute 0.1 0.0 - 0.7 K/uL   Basophils Relative 1 %   Basophils Absolute 0.0 0.0 - 0.1 K/uL    Comment: Performed at [facility], [address], [place], [place] [address]  Comprehensive metabolic panel     Status: Abnormal   Collection Time: 04/15/17  5:41 AM  Result Value Ref Range   Sodium 138 135 - 145 mmol/L   Potassium 4.0 3.5 - 5.1 mmol/L   Chloride 107 101 - 111 mmol/L   CO2 24 22 - 32 mmol/L   Glucose, Bld 101 (H) 65 - 99 mg/dL   BUN 10 6 - 20 mg/dL   Creatinine, Ser 0.84 0.44 - 1.00 mg/dL   Calcium 8.3 (L) 8.9 - 10.3 mg/dL   Total Protein 6.6 6.5 - 8.1 g/dL   Albumin 3.0 (L) 3.5 - 5.0 g/dL   AST 24 15 - 41 U/L   ALT 20 14 - 54 U/L   Alkaline Phosphatase 51 38 - 126 U/L   Total Bilirubin 0.4 0.3 - 1.2 mg/dL   GFR calc non Af Amer >60 >60 mL/min   GFR calc Af Amer >60 >60 mL/min    Comment: (NOTE) The eGFR has been calculated using the CKD EPI equation. This calculation has not been validated in all clinical situations. eGFR's persistently <60 mL/min signify possible Chronic Kidney Disease.    Anion gap 7 5 - 15    Comment: Performed at [facility], [address], [place], [place] [address]   Personally reviewed  CT scan and Xray- Thickened sigmoid colon, thickened bladder on left with some dots of air could be fistula formation, no free fluid or free air / abscess noted  CT a/p 2/15 IMPRESSION: 1. Inflamed, thick-walled cavity along the left serosal margin of the urinary bladder contains gas and fluid. There is a fistulous connection between this cavity and the sigmoid colon, along with wall thickening, diverticulosis, and likely diverticulitis of the sigmoid colon. Prominent wall thickening of the adjacent urinary bladder, some of which may be reactive; there is no current gas in the urinary bladder to suggest a fistula into the urinary bladder lumen, although correlation with any pyuria is recommended. 2. Please note that the  inflamed segment of sigmoid colon is similar to the prior exam of June 2018. While this is probably from diverticulitis, an underlying sigmoid colon tumor is not totally excluded. Given the patient's age, colon cancer seems less likely but should be considered as a remote possibility. 3. Lower lumbar impingement due to spondylosis and degenerative disc disease.  These results will be called to the ordering clinician or representative by the Radiologist Assistant, and communication documented in the PACS or zVision Dashboard.  Dg Abdomen Acute W/chest  Result Date: 04/14/2017 CLINICAL DATA:  Left lower abdominal pain and nausea. EXAM: DG ABDOMEN ACUTE W/ 1V CHEST COMPARISON:  CT 02/08/2018 FINDINGS: The cardiomediastinal contours are normal. The lungs are clear. There is no free intra-abdominal air. There is enteric contrast throughout the colon from prior CT. Moderate stool in the more proximal colon. No bowel dilatation to suggest obstruction. Small focus of air to the left of the rectosigmoid colon may reflect the subserosal bladder air as seen on CT versus normal bowel gas. No radiopaque calculi. No acute osseous abnormalities are seen. IMPRESSION: 1. No free air to suggest perforation.  No bowel obstruction. 2. Air to the left of the sigmoid colon may be the subserosal bladder air is seen on CT versus adjacent bowel gas. 3. Enteric contrast in the descending colon from prior CT. 4. Clear lungs. Electronically Signed   By: Jeb Levering M.D.   On: 04/14/2017 23:52    Assessment & Plan:  Marilyn Rivera is a 37 y.o. female with diverticulitis with multiple recurrences and possibly a developing colovesicular fistula based on the CT scan. She also could be forming an abscess given her pain and no true urinary symptoms.   -IV antibiotics for next few days to cool off -Clear liquids are ok for now  -Fevers, no improvement in the pain, or worsening WBC, will plan to repeat CT a/p with oral and  rectal and IV contrast, which will let us see if there is an abscess versus fistula formation -Discussed that trying to operate now will not be a good plan given the fistula possibility and also given her inflammation and infection in the area. She would need a colostomy and this would be challenging given her body habitus, in addition, in inflammation around the bladder could resolve and the fistula if present could resolved with time and antibiotics  -Will ultimately need her sigmoid colon resected in the future, and if concerns for any fistula, will need to get a CT with rectal contrast to assess prior and may need plans for combined case with urology / stents, etc   All questions were answered to the satisfaction of the patient.  Will follow.    Virl Cagey 04/15/2017, 10:39 AM

## 2017-04-15 NOTE — Progress Notes (Signed)
After paging Dr. Olevia Bowens about pts itching-new order for Benadryl 50mg  IV q6h PRN. Will give and continue to monitor pt

## 2017-04-16 DIAGNOSIS — K219 Gastro-esophageal reflux disease without esophagitis: Secondary | ICD-10-CM

## 2017-04-16 DIAGNOSIS — D509 Iron deficiency anemia, unspecified: Secondary | ICD-10-CM

## 2017-04-16 LAB — CBC WITH DIFFERENTIAL/PLATELET
BASOS PCT: 1 %
Basophils Absolute: 0.1 10*3/uL (ref 0.0–0.1)
EOS PCT: 2 %
Eosinophils Absolute: 0.1 10*3/uL (ref 0.0–0.7)
HEMATOCRIT: 29.4 % — AB (ref 36.0–46.0)
Hemoglobin: 8.3 g/dL — ABNORMAL LOW (ref 12.0–15.0)
LYMPHS ABS: 2.9 10*3/uL (ref 0.7–4.0)
Lymphocytes Relative: 46 %
MCH: 20.5 pg — ABNORMAL LOW (ref 26.0–34.0)
MCHC: 28.2 g/dL — AB (ref 30.0–36.0)
MCV: 72.6 fL — AB (ref 78.0–100.0)
MONO ABS: 0.8 10*3/uL (ref 0.1–1.0)
Monocytes Relative: 12 %
Neutro Abs: 2.5 10*3/uL (ref 1.7–7.7)
Neutrophils Relative %: 39 %
Platelets: 282 10*3/uL (ref 150–400)
RBC: 4.05 MIL/uL (ref 3.87–5.11)
RDW: 19.4 % — AB (ref 11.5–15.5)
WBC: 6.4 10*3/uL (ref 4.0–10.5)

## 2017-04-16 LAB — BASIC METABOLIC PANEL
ANION GAP: 10 (ref 5–15)
BUN: 10 mg/dL (ref 6–20)
CALCIUM: 8.1 mg/dL — AB (ref 8.9–10.3)
CO2: 21 mmol/L — AB (ref 22–32)
Chloride: 106 mmol/L (ref 101–111)
Creatinine, Ser: 0.91 mg/dL (ref 0.44–1.00)
GFR calc Af Amer: >60 mL/min (ref >60)
GFR calc non Af Amer: >60 mL/min (ref >60)
GLUCOSE: 89 mg/dL (ref 65–99)
Potassium: 4 mmol/L (ref 3.5–5.1)
Sodium: 137 mmol/L (ref 135–145)

## 2017-04-16 MED ORDER — PROMETHAZINE HCL 25 MG/ML IJ SOLN
12.5000 mg | Freq: Four times a day (QID) | INTRAMUSCULAR | Status: DC | PRN
  Administered 2017-04-16 – 2017-04-18 (×6): 12.5 mg via INTRAVENOUS
  Filled 2017-04-16 (×6): qty 1

## 2017-04-16 NOTE — Progress Notes (Signed)
[facility] Progress Note     Subjective: Pain minimally improved. Says she took in some ice and water. No urinary symptoms. No fevers.   Objective: Vital signs in last 24 hours: Temp:  [98.1 F (36.7 C)-98.9 F (37.2 C)] 98.2 F (36.8 C) (02/20 0430) Pulse Rate:  [73-86] 75 (02/20 0430) Resp:  [18-20] 20 (02/20 0430) BP: (129-134)/(30-60) 130/60 (02/20 0430) SpO2:  [94 %-96 %] 94 % (02/20 0430) Last BM Date: (unknown per patient )  Intake/Output from previous day: 02/19 0701 - 02/20 0700 In: 2721.3 [P.O.:240; I.V.:1781.3; IV Piggyback:700] Out: 300 [Urine:300] Intake/Output this shift: Total I/O In: 240 [P.O.:240] Out: 800 [Urine:800]  General appearance: alert, cooperative and no distress Resp: normal work breathing GI: soft, tender LLQ, non distended, no rebound or guarding  Lab Results:  Recent Labs    04/15/17 0541 04/16/17 0449  WBC 7.4 6.4  HGB 8.3* 8.3*  HCT 29.2* 29.4*  PLT 265 282   BMET Recent Labs    04/15/17 0541 04/16/17 0449  NA 138 137  K 4.0 4.0  CL 107 106  CO2 24 21*  GLUCOSE 101* 89  BUN 10 10  CREATININE 0.84 0.91  CALCIUM 8.3* 8.1*    Anti-infectives: Anti-infectives (From admission, onward)   Start     Dose/Rate Route Frequency Ordered Stop   04/15/17 1000  ciprofloxacin (CIPRO) IVPB 400 mg    Comments:  May adjust dose to patient's weight. Thank you.   400 mg 200 mL/hr over 60 Minutes Intravenous Every 12 hours 04/15/17 0502     04/15/17 0900  metroNIDAZOLE (FLAGYL) IVPB 500 mg    Comments:  May adjust dose to patient's body weight.  Thank you.   500 mg 100 mL/hr over 60 Minutes Intravenous Every 8 hours 04/15/17 0502     04/14/17 2215  ciprofloxacin (CIPRO) IVPB 400 mg     400 mg 200 mL/hr over 60 Minutes Intravenous  Once 04/14/17 2202 04/14/17 2323   04/14/17 2215  metroNIDAZOLE (FLAGYL) IVPB 500 mg     500 mg 100 mL/hr over 60 Minutes Intravenous  Once 04/14/17 2202 04/15/17 0107       Assessment/Plan: [name] is a 37 yo female with diverticulitis with multiple recurrences and possibly a developing colovesicular fistula. She continues to have some pain. WBC normal and no fevers.  -Continue IV antibiotics for now -Clear liquids, + protein supplements from nutrition ok -If pain not improving will need to repeat a CT scan possibly tomorrow to assess for abscess and give rectal contrast to assess for any fistula formation  -Any surgery at this time will result in an ostomy.     LOS: 2 days    Virl Cagey 04/16/2017

## 2017-04-16 NOTE — Progress Notes (Signed)
PROGRESS NOTE  DYANE BROBERG GYI:[id number] DOB: 06/24/1980 DOA: 04/14/2017 PCP: Patient, No Pcp Per  HPI/Recap of past 86 hours: 37 year old female past medical history of diverticulosis/diverticulitis and morbid obesity diagnosed with diverticulitis on 2/15 and started on oral antibiotics without improvement. Patient had fever and increasing pain in her left lower quadrant as well as suprapubic area with bowel movements and urination. She came into the emergency room on the evening of 2/18 and a CT scan noted signs consistent with acute diverticulitis, but also area concerning for possible fistula connecting her colon to her bladder. No evidence of sepsis.  other labs normal. Patient started on IV antibiotics. General surgery consulted who recommended at this time given overall stability for conservative treatment of her diverticulitis and then repeat imaging to later confirmed a fistula was indeed present. Patient doing okay, complains of mild nausea and left lower quadrant pain.   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  General surgery following.  His symptoms do not improve, may need repeat CT imaging to evaluate for underlying abscess as well as rectal contrast to evaluate for fistula development.  Since she is still having pain, will continue on clear liquids for now. Active Problems:   GERD (gastroesophageal reflux disease): Stable Morbid obesity: BMI greater than 40.  Counseled on the importance of diet and exercise   Iron deficiency anemia.  Hemoglobin is currently at baseline.   Microcytic anemia   Code Status:  Full code   Family Communication: No family present  Disposition Plan: Home once diverticulitis resolved    Consultants:  General surgery   Procedures:  None   Antimicrobials:  IV Cipro and Flagyl 2/18-present    DVT  prophylaxis: Heparin   Objective: Vitals:   04/15/17 1500 04/15/17 2042 04/16/17 0430 04/16/17 1420  BP: (!) 134/53 (!) 129/30 130/60 112/63  Pulse: 73 86 75 82  Resp: 20 18 20 18   Temp: 98.1 F (36.7 C) 98.9 F (37.2 C) 98.2 F (36.8 C) 98.5 F (36.9 C)  TempSrc: Oral Oral Oral Oral  SpO2: 94% 96% 94% 100%  Weight:      Height:        Intake/Output Summary (Last 24 hours) at 04/16/2017 1947 Last data filed at 04/16/2017 1100 Gross per 24 hour  Intake 1215 ml  Output 900 ml  Net 315 ml   Filed Weights   04/14/17 1947 04/15/17 0038  Weight: (!) 178.7 kg (394 lb) (!) 178.4 kg (393 lb 6.4 oz)   Body mass index is 58.1 kg/m.  Exam:  General exam: Alert, awake, oriented x 3 Respiratory system: Clear to auscultation. Respiratory effort normal. Cardiovascular system:RRR. No murmurs, rubs, gallops. Gastrointestinal system: Abdomen is obese, soft, tender in left lower quadrant. No organomegaly or masses felt. Normal bowel sounds heard. Central nervous system: Alert and oriented. No focal neurological deficits. Extremities: No C/C/E, +pedal pulses Skin: No rashes, lesions or ulcers  Psychiatry: Judgement and insight appear normal. Mood & affect appropriate.    Data Reviewed: CBC: Recent Labs  Lab 04/14/17 2019 04/15/17 0541 04/16/17 0449  WBC 8.3 7.4 6.4  NEUTROABS  --  3.1 2.5  HGB 9.1* 8.3* 8.3*  HCT 32.6* 29.2* 29.4*  MCV 73.1* 73.0* 72.6*  PLT 307 265 350   Basic Metabolic Panel: Recent Labs  Lab 04/14/17 2019 04/15/17 0541 04/16/17 0449  NA 139 138 137  K 4.0 4.0 4.0  CL 107  107 106  CO2 24 24 21*  GLUCOSE 124* 101* 89  BUN 11 10 10   CREATININE 0.83 0.84 0.91  CALCIUM 8.7* 8.3* 8.1*   GFR: Estimated Creatinine Clearance: 148.5 mL/min (by C-G formula based on SCr of 0.91 mg/dL). Liver Function Tests: Recent Labs  Lab 04/14/17 2019 04/15/17 0541  AST 24 24  ALT 20 20  ALKPHOS 62 51  BILITOT 0.2* 0.4  PROT 7.4 6.6  ALBUMIN 3.4* 3.0*    Recent Labs  Lab 04/14/17 2019  LIPASE 25   No results for input(s): AMMONIA in the last 168 hours. Coagulation Profile: No results for input(s): INR, PROTIME in the last 168 hours. Cardiac Enzymes: No results for input(s): CKTOTAL, CKMB, CKMBINDEX, TROPONINI in the last 168 hours. BNP (last 3 results) No results for input(s): PROBNP in the last 8760 hours. HbA1C: No results for input(s): HGBA1C in the last 72 hours. CBG: No results for input(s): GLUCAP in the last 168 hours. Lipid Profile: No results for input(s): CHOL, HDL, LDLCALC, TRIG, CHOLHDL, LDLDIRECT in the last 72 hours. Thyroid Function Tests: No results for input(s): TSH, T4TOTAL, FREET4, T3FREE, THYROIDAB in the last 72 hours. Anemia Panel: No results for input(s): VITAMINB12, FOLATE, FERRITIN, TIBC, IRON, RETICCTPCT in the last 72 hours. Urine analysis:    Component Value Date/Time   COLORURINE YELLOW 04/14/2017 1950   APPEARANCEUR CLEAR 04/14/2017 1950   LABSPEC 1.024 04/14/2017 1950   PHURINE 6.0 04/14/2017 1950   GLUCOSEU NEGATIVE 04/14/2017 1950   HGBUR NEGATIVE 04/14/2017 1950   BILIRUBINUR NEGATIVE 04/14/2017 1950   KETONESUR NEGATIVE 04/14/2017 1950   PROTEINUR NEGATIVE 04/14/2017 1950   UROBILINOGEN 0.2 10/05/2014 0028   NITRITE NEGATIVE 04/14/2017 1950   LEUKOCYTESUR NEGATIVE 04/14/2017 1950   Sepsis Labs: @LABRCNTIP (procalcitonin:4,lacticidven:4)  )No results found for this or any previous visit (from the past 240 hour(s)).    Studies: No results found.  Scheduled Meds: . feeding supplement  1 Container Oral BID BM  . feeding supplement (PRO-STAT SUGAR FREE 64)  30 mL Oral TID BM  . heparin  5,000 Units Subcutaneous Q8H  . pantoprazole (PROTONIX) IV  40 mg Intravenous Q24H    Continuous Infusions: . sodium chloride 75 mL/hr at 04/16/17 1434  . ciprofloxacin Stopped (04/16/17 0926)  . metronidazole Stopped (04/16/17 1751)     LOS: 2 days     Kathie Dike, MD [facility]  To reach me or the doctor on call, go to: [email or web address] Password [email or web address]  04/16/2017, 7:47 PM

## 2017-04-17 ENCOUNTER — Inpatient Hospital Stay (HOSPITAL_COMMUNITY): Payer: BLUE CROSS/BLUE SHIELD

## 2017-04-17 LAB — CBC WITH DIFFERENTIAL/PLATELET
BASOS PCT: 1 %
Basophils Absolute: 0.1 10*3/uL (ref 0.0–0.1)
EOS PCT: 3 %
Eosinophils Absolute: 0.2 10*3/uL (ref 0.0–0.7)
HCT: 29.6 % — ABNORMAL LOW (ref 36.0–46.0)
HEMOGLOBIN: 8.4 g/dL — AB (ref 12.0–15.0)
LYMPHS ABS: 3.2 10*3/uL (ref 0.7–4.0)
Lymphocytes Relative: 50 %
MCH: 20.7 pg — AB (ref 26.0–34.0)
MCHC: 28.4 g/dL — AB (ref 30.0–36.0)
MCV: 73.1 fL — AB (ref 78.0–100.0)
MONOS PCT: 14 %
Monocytes Absolute: 0.9 10*3/uL (ref 0.1–1.0)
NEUTROS PCT: 32 %
Neutro Abs: 2 10*3/uL (ref 1.7–7.7)
Platelets: 353 10*3/uL (ref 150–400)
RBC: 4.05 MIL/uL (ref 3.87–5.11)
RDW: 19.6 % — ABNORMAL HIGH (ref 11.5–15.5)
WBC: 6.4 10*3/uL (ref 4.0–10.5)

## 2017-04-17 LAB — BASIC METABOLIC PANEL
Anion gap: 7 (ref 5–15)
BUN: 9 mg/dL (ref 6–20)
CHLORIDE: 106 mmol/L (ref 101–111)
CO2: 24 mmol/L (ref 22–32)
Calcium: 8.1 mg/dL — ABNORMAL LOW (ref 8.9–10.3)
Creatinine, Ser: 0.99 mg/dL (ref 0.44–1.00)
GFR calc Af Amer: >60 mL/min (ref >60)
GFR calc non Af Amer: >60 mL/min (ref >60)
GLUCOSE: 95 mg/dL (ref 65–99)
POTASSIUM: 3.9 mmol/L (ref 3.5–5.1)
Sodium: 137 mmol/L (ref 135–145)

## 2017-04-17 MED ORDER — IOPAMIDOL (ISOVUE-300) INJECTION 61%
120.0000 mL | Freq: Once | INTRAVENOUS | Status: AC | PRN
  Administered 2017-04-17: 120 mL via INTRAVENOUS

## 2017-04-17 NOTE — Progress Notes (Signed)
[facility]   Repeat CT with improving diverticulitis, no fistula, gas around bladder from some cystitis adjacent to the diverticulitis, no gas in bladder, improved edema.  Continue course.   Updated patient.   Curlene Labrum, MD [facility] [address] [place], [place] [address] [phone] (office)   Dg Chest 2 View  Result Date: 04/03/2017 CLINICAL DATA:  Shortness of breath with cough and congestion. Fever. EXAM: CHEST  2 VIEW COMPARISON:  October 27, 2014 FINDINGS: Lungs are clear. Heart size and pulmonary vascularity are normal. No adenopathy. No bone lesions. IMPRESSION: No edema or consolidation. Electronically Signed   By: Lowella Grip III M.D.   On: 04/03/2017 14:35   Ct Abdomen Pelvis W Contrast  Result Date: 04/17/2017 CLINICAL DATA:  Left lower quadrant abdominal pain. Assess for colovesical fistula. History of diverticulitis. EXAM: CT ABDOMEN AND PELVIS WITH CONTRAST TECHNIQUE: Multidetector CT imaging of the abdomen and pelvis was performed using the standard protocol following bolus administration of intravenous contrast. CONTRAST:  147mL ISOVUE-300 IOPAMIDOL (ISOVUE-300) INJECTION 61% COMPARISON:  04/11/2017.  07/31/2016.  04/19/2016. FINDINGS: Lower chest: Areas of linear atelectasis in both lower lungs. No pleural fluid. Hepatobiliary: Normal without contrast.  No calcified gallstones. Pancreas: Normal Spleen: Normal Adrenals/Urinary Tract: Adrenal glands are normal. Kidneys are normal. Re demonstrated is an area of thickening along the left side of the bladder, with a small air/gas collection. This appears very similar with maximal thickness of the process measuring about 2.5 cm. This is felt to be due to inflammation of the left side of the bladder secondary to adjacent sigmoid diverticulitis. Again, there is no air/gas free within the bladder. Rectal contrast does not leave the confines of the sigmoid colon and  therefore no active/patent fistula is demonstrated. Stomach/Bowel: As noted above, rectal contrast is given. There is no extension of contrast from the sigmoid colon that would allow diagnosis fistula. There is diverticulosis and diverticulitis in the sigmoid region which is improving on the basis of slightly less surrounding stranding in the fat. No new collection or new finding. Vascular/Lymphatic: Normal Reproductive: Uterus and adnexal regions appear normal. Other: No free fluid or air. Musculoskeletal: Chronic lower lumbar degenerative changes. IMPRESSION: Similar appearance to the study of 6 days ago. Chronic thickening of the left bladder wall. Small air/gas collection along the lateral margin. This is presumed to represent lateral wall cystitis secondary to adjacent diverticulitis. The diverticulitis itself appears to be improving with less surrounding edema. Rectal contrast does not show any extravasation or extension into a demonstrable fistula. There is no gas free within the bladder. Electronically Signed   By: Nelson Chimes M.D.   On: 04/17/2017 13:25   Ct Abdomen Pelvis W Contrast  Result Date: 04/11/2017 CLINICAL DATA:  Lower abdominal pain and nausea for 1 week. History of renal calculi. EXAM: CT ABDOMEN AND PELVIS WITH CONTRAST TECHNIQUE: Multidetector CT imaging of the abdomen and pelvis was performed using the standard protocol following bolus administration of intravenous contrast. 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 There is gas and fluid in a collection along the notably thickened urinary bladder wall as on  image 79/2 and images 39-34 of series 5. I do not see any definite gas within  the urinary bladder lumen currently. Vascular/Lymphatic: Small retroperitoneal and external iliac lymph nodes are probably reactive one of the largest is a 1.1 cm right external iliac node on image 77/2 (previously 0.9 cm). Reproductive: Mildly retroverted uterus. Other: Suspected trace amount of free pelvic fluid along the left cul-de-sac. Musculoskeletal: Spondylosis and degenerative disc disease at L4-5 and L5-S1, causing left foraminal impingement at both levels and right foraminal impingement at L4-5. IMPRESSION: 1. Inflamed, thick-walled cavity along the left serosal margin of the urinary bladder contains gas and fluid. There is a fistulous connection between this cavity and the sigmoid colon, along with wall thickening, diverticulosis, and likely diverticulitis of the sigmoid colon. Prominent wall thickening of the adjacent urinary bladder, some of which may be reactive; there is no current gas in the urinary bladder to suggest a fistula into the urinary bladder lumen, although correlation with any pyuria is recommended. 2. Please note that the inflamed segment of sigmoid colon is similar to the prior exam of June 2018. While this is probably from diverticulitis, an underlying sigmoid colon tumor is not totally excluded. Given the patient's age, colon cancer seems less likely but should be considered as a remote possibility. 3. Lower lumbar impingement due to spondylosis and degenerative disc disease. These results will be called to the ordering clinician or representative by the Radiologist Assistant, and communication documented in the PACS or zVision Dashboard. Electronically Signed   By: Van Clines M.D.   On: 04/11/2017 12:07   US Venous Img Lower Bilateral  Result Date: 04/15/2017 CLINICAL DATA:  Bilateral lower extremity edema, left greater than right. History of morbid obesity. Evaluate for DVT. EXAM: BILATERAL  LOWER EXTREMITY VENOUS DOPPLER ULTRASOUND TECHNIQUE: Gray-scale sonography with graded compression, as well as color Doppler and duplex ultrasound were performed to evaluate the lower extremity deep venous systems from the level of the common femoral vein and including the common femoral, femoral, profunda femoral, popliteal and calf veins including the posterior tibial, peroneal and gastrocnemius veins when visible. The superficial great saphenous vein was also interrogated. Spectral Doppler was utilized to evaluate flow at rest and with distal augmentation maneuvers in the common femoral, femoral and popliteal veins. COMPARISON:  None. FINDINGS: RIGHT LOWER EXTREMITY Common Femoral Vein: No evidence of thrombus. Normal compressibility, respiratory phasicity and response to augmentation. Saphenofemoral Junction: No evidence of thrombus. Normal compressibility and flow on color Doppler imaging. Profunda Femoral Vein: No evidence of thrombus. Normal compressibility and flow on color Doppler imaging. Femoral Vein: No evidence of thrombus. Normal compressibility, respiratory phasicity and response to augmentation. Popliteal Vein: No evidence of thrombus. Normal compressibility, respiratory phasicity and response to augmentation. Calf Veins: No evidence of thrombus. Normal compressibility and flow on color Doppler imaging. Superficial Great Saphenous Vein: No evidence of thrombus. Normal compressibility. Venous Reflux:  None. Other Findings:  None. LEFT LOWER EXTREMITY Common Femoral Vein: No evidence of thrombus. Normal compressibility, respiratory phasicity and response to augmentation. Saphenofemoral Junction: No evidence of thrombus. Normal compressibility and flow on color Doppler imaging. Profunda Femoral Vein: No evidence of thrombus. Normal compressibility and flow on color Doppler imaging. Femoral Vein: No evidence of thrombus. Normal compressibility, respiratory phasicity and response to augmentation.  Popliteal Vein: No evidence of thrombus. Normal compressibility, respiratory phasicity and response to augmentation. Calf Veins: No evidence of thrombus. Normal compressibility and flow on color Doppler imaging. Superficial Great Saphenous Vein: No evidence of thrombus. Normal compressibility. Venous Reflux:  None. Other Findings:  None. IMPRESSION: No evidence of DVT within  either lower extremity. Electronically Signed   By: Sandi Mariscal M.D.   On: 04/15/2017 11:46   Dg Abdomen Acute W/chest  Result Date: 04/14/2017 CLINICAL DATA:  Left lower abdominal pain and nausea. EXAM: DG ABDOMEN ACUTE W/ 1V CHEST COMPARISON:  CT 02/08/2018 FINDINGS: The cardiomediastinal contours are normal. The lungs are clear. There is no free intra-abdominal air. There is enteric contrast throughout the colon from prior CT. Moderate stool in the more proximal colon. No bowel dilatation to suggest obstruction. Small focus of air to the left of the rectosigmoid colon may reflect the subserosal bladder air as seen on CT versus normal bowel gas. No radiopaque calculi. No acute osseous abnormalities are seen. IMPRESSION: 1. No free air to suggest perforation.  No bowel obstruction. 2. Air to the left of the sigmoid colon may be the subserosal bladder air is seen on CT versus adjacent bowel gas. 3. Enteric contrast in the descending colon from prior CT. 4. Clear lungs. Electronically Signed   By: Jeb Levering M.D.   On: 04/14/2017 23:52

## 2017-04-17 NOTE — Progress Notes (Signed)
[facility] Progress Note     Subjective: No major issues but still with LLQ pain and pressure in her pelvis with urinating or having BM. No sediment or air in her urine. Feels nauseated but took in 720 po.  Objective: Vital signs in last 24 hours: Temp:  [98.5 F (36.9 C)-98.8 F (37.1 C)] 98.8 F (37.1 C) (02/21 0515) Pulse Rate:  [82-88] 88 (02/21 0515) Resp:  [18-20] 20 (02/21 0515) BP: (109-128)/(38-63) 109/60 (02/21 0515) SpO2:  [95 %-100 %] 95 % (02/21 0515) Last BM Date: (unknown per patient )  Intake/Output from previous day: 02/20 0701 - 02/21 0700 In: 3370 [P.O.:720; I.V.:1950; IV Piggyback:700] Out: 2200 [Urine:2200] Intake/Output this shift: No intake/output data recorded.  General appearance: alert, cooperative and no distress Resp: normal work breathing GI: soft, LLQ tenderness and suprapubic tenderness   Lab Results:  Recent Labs    04/16/17 0449 04/17/17 0428  WBC 6.4 6.4  HGB 8.3* 8.4*  HCT 29.4* 29.6*  PLT 282 353   BMET Recent Labs    04/16/17 0449 04/17/17 0428  NA 137 137  K 4.0 3.9  CL 106 106  CO2 21* 24  GLUCOSE 89 95  BUN 10 9  CREATININE 0.91 0.99  CALCIUM 8.1* 8.1*   PT/INR No results for input(s): LABPROT, INR in the last 72 hours.  Studies/Results: US Venous Img Lower Bilateral  Result Date: 04/15/2017 CLINICAL DATA:  Bilateral lower extremity edema, left greater than right. History of morbid obesity. Evaluate for DVT. EXAM: BILATERAL LOWER EXTREMITY VENOUS DOPPLER ULTRASOUND TECHNIQUE: Gray-scale sonography with graded compression, as well as color Doppler and duplex ultrasound were performed to evaluate the lower extremity deep venous systems from the level of the common femoral vein and including the common femoral, femoral, profunda femoral, popliteal and calf veins including the posterior tibial, peroneal and gastrocnemius veins when visible. The superficial great saphenous vein was also interrogated.  Spectral Doppler was utilized to evaluate flow at rest and with distal augmentation maneuvers in the common femoral, femoral and popliteal veins. COMPARISON:  None. FINDINGS: RIGHT LOWER EXTREMITY Common Femoral Vein: No evidence of thrombus. Normal compressibility, respiratory phasicity and response to augmentation. Saphenofemoral Junction: No evidence of thrombus. Normal compressibility and flow on color Doppler imaging. Profunda Femoral Vein: No evidence of thrombus. Normal compressibility and flow on color Doppler imaging. Femoral Vein: No evidence of thrombus. Normal compressibility, respiratory phasicity and response to augmentation. Popliteal Vein: No evidence of thrombus. Normal compressibility, respiratory phasicity and response to augmentation. Calf Veins: No evidence of thrombus. Normal compressibility and flow on color Doppler imaging. Superficial Great Saphenous Vein: No evidence of thrombus. Normal compressibility. Venous Reflux:  None. Other Findings:  None. LEFT LOWER EXTREMITY Common Femoral Vein: No evidence of thrombus. Normal compressibility, respiratory phasicity and response to augmentation. Saphenofemoral Junction: No evidence of thrombus. Normal compressibility and flow on color Doppler imaging. Profunda Femoral Vein: No evidence of thrombus. Normal compressibility and flow on color Doppler imaging. Femoral Vein: No evidence of thrombus. Normal compressibility, respiratory phasicity and response to augmentation. Popliteal Vein: No evidence of thrombus. Normal compressibility, respiratory phasicity and response to augmentation. Calf Veins: No evidence of thrombus. Normal compressibility and flow on color Doppler imaging. Superficial Great Saphenous Vein: No evidence of thrombus. Normal compressibility. Venous Reflux:  None. Other Findings:  None. IMPRESSION: No evidence of DVT within either lower extremity. Electronically Signed   By: Sandi Mariscal M.D.   On: 04/15/2017 11:46     Anti-infectives: Anti-infectives (  From admission, onward)   Start     Dose/Rate Route Frequency Ordered Stop   04/15/17 1000  ciprofloxacin (CIPRO) IVPB 400 mg    Comments:  May adjust dose to patient's weight. Thank you.   400 mg 200 mL/hr over 60 Minutes Intravenous Every 12 hours 04/15/17 0502     04/15/17 0900  metroNIDAZOLE (FLAGYL) IVPB 500 mg    Comments:  May adjust dose to patient's body weight.  Thank you.   500 mg 100 mL/hr over 60 Minutes Intravenous Every 8 hours 04/15/17 0502     04/14/17 2215  ciprofloxacin (CIPRO) IVPB 400 mg     400 mg 200 mL/hr over 60 Minutes Intravenous  Once 04/14/17 2202 04/14/17 2323   04/14/17 2215  metroNIDAZOLE (FLAGYL) IVPB 500 mg     500 mg 100 mL/hr over 60 Minutes Intravenous  Once 04/14/17 2202 04/15/17 0107      Assessment/Plan: 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  -Continue IV antibiotics -CT today with po/ rectal and IV contrast  -Clear liquids, + protein supplements from nutritio    LOS: 3 days    Virl Cagey 04/17/2017

## 2017-04-17 NOTE — Progress Notes (Signed)
PROGRESS NOTE  Marilyn Rivera UXL:[id number] DOB: 1981/01/27 DOA: 04/14/2017 PCP: Patient, No Pcp Per  HPI/Recap of past 85 hours: 37 year old female past medical history of diverticulosis/diverticulitis and morbid obesity diagnosed with diverticulitis on 2/15 and started on oral antibiotics without improvement. Patient had fever and increasing pain in her left lower quadrant as well as suprapubic area with bowel movements and urination. She came into the emergency room on the evening of 2/18 and a CT scan noted signs consistent with acute diverticulitis, but also area concerning for possible fistula connecting her colon to her bladder. No evidence of sepsis.  other labs normal. Patient started on IV antibiotics. General surgery consulted who recommended at this time given overall stability for conservative treatment of her diverticulitis and then repeat imaging to later confirmed a fistula was indeed present. Patient doing okay, complains of mild nausea and left lower quadrant pain.   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  General surgery following.  She underwent repeat CT abdomen today that showed improvement in diverticulitis. Continue current treatments. Continue clear liquids. Active Problems:   Nausea and vomiting. Patient has not had any vomiting today, but continues to have nausea. This is likely related to diverticulitis/antibiotics as well as pain medications.   GERD (gastroesophageal reflux disease): stable Morbid obesity: BMI greater than 40.  Counseled on the importance of diet and exercise   Iron deficiency anemia.  Hemoglobin is stable and currently at baseline.   Microcytic anemia   Code Status:  Full code   Family Communication: No family present  Disposition Plan: Home once diverticulitis resolved    Consultants:  General surgery    Procedures:  None   Antimicrobials:  IV Cipro and Flagyl 2/18-present    DVT prophylaxis: Heparin   Objective: Vitals:   04/16/17 0430 04/16/17 1420 04/16/17 2105 04/17/17 0515  BP: 130/60 112/63 (!) 128/38 109/60  Pulse: 75 82 86 88  Resp: 20 18 20 20   Temp: 98.2 F (36.8 C) 98.5 F (36.9 C) 98.5 F (36.9 C) 98.8 F (37.1 C)  TempSrc: Oral Oral Oral Oral  SpO2: 94% 100% 100% 95%  Weight:      Height:        Intake/Output Summary (Last 24 hours) at 04/17/2017 1621 Last data filed at 04/17/2017 1148 Gross per 24 hour  Intake 3130 ml  Output 1900 ml  Net 1230 ml   Filed Weights   04/14/17 1947 04/15/17 0038  Weight: (!) 178.7 kg (394 lb) (!) 178.4 kg (393 lb 6.4 oz)   Body mass index is 58.1 kg/m.  Exam:  .General exam: Alert, awake, oriented x 3 Respiratory system: Clear to auscultation. Respiratory effort normal. Cardiovascular system:RRR. No murmurs, rubs, gallops. Gastrointestinal system: Abdomen is obese, soft, tender in LLQ. No organomegaly or masses felt. Normal bowel sounds heard. Central nervous system: Alert and oriented. No focal neurological deficits. Extremities: No C/C/E, +pedal pulses Skin: No rashes, lesions or ulcers  Psychiatry: Judgement and insight appear normal. Mood & affect appropriate.      Data Reviewed: CBC: Recent Labs  Lab 04/14/17 2019 04/15/17 0541 04/16/17 0449 04/17/17 0428  WBC 8.3 7.4 6.4 6.4  NEUTROABS  --  3.1 2.5 2.0  HGB 9.1* 8.3* 8.3* 8.4*  HCT 32.6* 29.2* 29.4* 29.6*  MCV 73.1* 73.0* 72.6* 73.1*  PLT 307 265 282 536   Basic Metabolic Panel: Recent Labs  Lab 04/14/17 2019 04/15/17  1324 04/16/17 0449 04/17/17 0428  NA 139 138 137 137  K 4.0 4.0 4.0 3.9  CL 107 107 106 106  CO2 24 24 21* 24  GLUCOSE 124* 101* 89 95  BUN 11 10 10 9   CREATININE 0.83 0.84 0.91 0.99  CALCIUM 8.7* 8.3* 8.1* 8.1*   GFR: Estimated Creatinine Clearance: 136.5 mL/min (by C-G formula based on SCr of 0.99  mg/dL). Liver Function Tests: Recent Labs  Lab 04/14/17 2019 04/15/17 0541  AST 24 24  ALT 20 20  ALKPHOS 62 51  BILITOT 0.2* 0.4  PROT 7.4 6.6  ALBUMIN 3.4* 3.0*   Recent Labs  Lab 04/14/17 2019  LIPASE 25   No results for input(s): AMMONIA in the last 168 hours. Coagulation Profile: No results for input(s): INR, PROTIME in the last 168 hours. Cardiac Enzymes: No results for input(s): CKTOTAL, CKMB, CKMBINDEX, TROPONINI in the last 168 hours. BNP (last 3 results) No results for input(s): PROBNP in the last 8760 hours. HbA1C: No results for input(s): HGBA1C in the last 72 hours. CBG: No results for input(s): GLUCAP in the last 168 hours. Lipid Profile: No results for input(s): CHOL, HDL, LDLCALC, TRIG, CHOLHDL, LDLDIRECT in the last 72 hours. Thyroid Function Tests: No results for input(s): TSH, T4TOTAL, FREET4, T3FREE, THYROIDAB in the last 72 hours. Anemia Panel: No results for input(s): VITAMINB12, FOLATE, FERRITIN, TIBC, IRON, RETICCTPCT in the last 72 hours. Urine analysis:    Component Value Date/Time   COLORURINE YELLOW 04/14/2017 1950   APPEARANCEUR CLEAR 04/14/2017 1950   LABSPEC 1.024 04/14/2017 1950   PHURINE 6.0 04/14/2017 1950   GLUCOSEU NEGATIVE 04/14/2017 1950   HGBUR NEGATIVE 04/14/2017 1950   BILIRUBINUR NEGATIVE 04/14/2017 1950   KETONESUR NEGATIVE 04/14/2017 1950   PROTEINUR NEGATIVE 04/14/2017 1950   UROBILINOGEN 0.2 10/05/2014 0028   NITRITE NEGATIVE 04/14/2017 1950   LEUKOCYTESUR NEGATIVE 04/14/2017 1950   Sepsis Labs: @LABRCNTIP (procalcitonin:4,lacticidven:4)  )No results found for this or any previous visit (from the past 240 hour(s)).    Studies: Ct Abdomen Pelvis W Contrast  Result Date: 04/17/2017 CLINICAL DATA:  Left lower quadrant abdominal pain. Assess for colovesical fistula. History of diverticulitis. EXAM: CT ABDOMEN AND PELVIS WITH CONTRAST TECHNIQUE: Multidetector CT imaging of the abdomen and pelvis was performed using  the standard protocol following bolus administration of intravenous contrast. CONTRAST:  134mL ISOVUE-300 IOPAMIDOL (ISOVUE-300) INJECTION 61% COMPARISON:  04/11/2017.  07/31/2016.  04/19/2016. FINDINGS: Lower chest: Areas of linear atelectasis in both lower lungs. No pleural fluid. Hepatobiliary: Normal without contrast.  No calcified gallstones. Pancreas: Normal Spleen: Normal Adrenals/Urinary Tract: Adrenal glands are normal. Kidneys are normal. Re demonstrated is an area of thickening along the left side of the bladder, with a small air/gas collection. This appears very similar with maximal thickness of the process measuring about 2.5 cm. This is felt to be due to inflammation of the left side of the bladder secondary to adjacent sigmoid diverticulitis. Again, there is no air/gas free within the bladder. Rectal contrast does not leave the confines of the sigmoid colon and therefore no active/patent fistula is demonstrated. Stomach/Bowel: As noted above, rectal contrast is given. There is no extension of contrast from the sigmoid colon that would allow diagnosis fistula. There is diverticulosis and diverticulitis in the sigmoid region which is improving on the basis of slightly less surrounding stranding in the fat. No new collection or new finding. Vascular/Lymphatic: Normal Reproductive: Uterus and adnexal regions appear normal. Other: No free fluid or air. Musculoskeletal: Chronic  lower lumbar degenerative changes. IMPRESSION: Similar appearance to the study of 6 days ago. Chronic thickening of the left bladder wall. Small air/gas collection along the lateral margin. This is presumed to represent lateral wall cystitis secondary to adjacent diverticulitis. The diverticulitis itself appears to be improving with less surrounding edema. Rectal contrast does not show any extravasation or extension into a demonstrable fistula. There is no gas free within the bladder. Electronically Signed   By: Nelson Chimes M.D.    On: 04/17/2017 13:25    Scheduled Meds: . feeding supplement  1 Container Oral BID BM  . feeding supplement (PRO-STAT SUGAR FREE 64)  30 mL Oral TID BM  . heparin  5,000 Units Subcutaneous Q8H  . pantoprazole (PROTONIX) IV  40 mg Intravenous Q24H    Continuous Infusions: . sodium chloride 75 mL/hr at 04/16/17 1434  . ciprofloxacin Stopped (04/17/17 1059)  . metronidazole Stopped (04/17/17 1059)     LOS: 3 days     Kathie Dike, MD [facility]  To reach me or the doctor on call, go to: [email or web address] Password [facility]  04/17/2017, 4:21 PM

## 2017-04-18 LAB — CBC WITH DIFFERENTIAL/PLATELET
BASOS PCT: 1 %
Basophils Absolute: 0.1 10*3/uL (ref 0.0–0.1)
EOS PCT: 4 %
Eosinophils Absolute: 0.3 10*3/uL (ref 0.0–0.7)
HCT: 29.6 % — ABNORMAL LOW (ref 36.0–46.0)
HEMOGLOBIN: 8.3 g/dL — AB (ref 12.0–15.0)
LYMPHS ABS: 3 10*3/uL (ref 0.7–4.0)
LYMPHS PCT: 43 %
MCH: 20.3 pg — ABNORMAL LOW (ref 26.0–34.0)
MCHC: 28 g/dL — AB (ref 30.0–36.0)
MCV: 72.5 fL — AB (ref 78.0–100.0)
MONOS PCT: 16 %
Monocytes Absolute: 1.1 10*3/uL — ABNORMAL HIGH (ref 0.1–1.0)
NEUTROS ABS: 2.5 10*3/uL (ref 1.7–7.7)
Neutrophils Relative %: 36 %
Platelets: 310 10*3/uL (ref 150–400)
RBC: 4.08 MIL/uL (ref 3.87–5.11)
RDW: 19.4 % — AB (ref 11.5–15.5)
WBC: 7 10*3/uL (ref 4.0–10.5)

## 2017-04-18 MED ORDER — OXYCODONE-ACETAMINOPHEN 5-325 MG PO TABS
1.0000 | ORAL_TABLET | ORAL | Status: DC | PRN
  Administered 2017-04-18 – 2017-04-19 (×2): 2 via ORAL
  Filled 2017-04-18 (×2): qty 2

## 2017-04-18 NOTE — Progress Notes (Signed)
PROGRESS NOTE  Marilyn Rivera QQV:[id number] DOB: October 06, 1980 DOA: 04/14/2017 PCP: Patient, No Pcp Per  HPI/Recap of past 39 hours: 37 year old female past medical history of diverticulosis/diverticulitis and morbid obesity diagnosed with diverticulitis on 2/15 and started on oral antibiotics without improvement. Patient had fever and increasing pain in her left lower quadrant as well as suprapubic area with bowel movements and urination. She came into the emergency room on the evening of 2/18 and a CT scan noted signs consistent with acute diverticulitis, but also area concerning for possible fistula connecting her colon to her bladder. No evidence of sepsis.  other labs normal. Patient started on IV antibiotics. General surgery consulted who recommended at this time given overall stability for conservative treatment of her diverticulitis and then repeat imaging to later confirmed a fistula was indeed present. Patient doing okay, complains of mild nausea and left lower quadrant pain.   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  General surgery following.  She underwent repeat CT abdomen today that showed improvement in diverticulitis. Continue current treatments.  Diet advanced to soft foods.. Active Problems:   Nausea and vomiting.  Continues to have some nausea.  Will discontinue hydromorphone.  Advance diet as tolerated.   GERD (gastroesophageal reflux disease): stable Morbid obesity: BMI greater than 40.  Counseled on the importance of diet and exercise   Iron deficiency anemia.  Hemoglobin stable and currently at baseline.   Microcytic anemia   Code Status:  Full code   Family Communication: No family present  Disposition Plan: Home once diverticulitis resolved    Consultants:  General surgery   Procedures:  None   Antimicrobials:  IV Cipro and Flagyl  2/18-present    DVT prophylaxis: Heparin   Subjective: Continues to have some pain and nausea, but feels a little better as compared to yesterday.  Objective: Vitals:   04/17/17 2051 04/18/17 0202 04/18/17 0500 04/18/17 1500  BP: 116/65 126/62 (!) 122/50 129/63  Pulse: 92 87 80 85  Resp: (!) 24 20 16 18   Temp: 99.1 F (37.3 C) 98.9 F (37.2 C) 98.5 F (36.9 C) 98.2 F (36.8 C)  TempSrc: Oral Oral Oral Oral  SpO2: 93% 99% 95% 96%  Weight:      Height:        Intake/Output Summary (Last 24 hours) at 04/18/2017 1821 Last data filed at 04/18/2017 1300 Gross per 24 hour  Intake 3380 ml  Output 3400 ml  Net -20 ml   Filed Weights   04/14/17 1947 04/15/17 0038  Weight: (!) 178.7 kg (394 lb) (!) 178.4 kg (393 lb 6.4 oz)   Body mass index is 58.1 kg/m.  Exam:  General exam: Alert, awake, oriented x 3 Respiratory system: Clear to auscultation. Respiratory effort normal. Cardiovascular system:RRR. No murmurs, rubs, gallops. Gastrointestinal system: Abdomen is soft, obese, tender in LLQ. No organomegaly or masses felt. Normal bowel sounds heard. Central nervous system: Alert and oriented. No focal neurological deficits. Extremities: No C/C/E, +pedal pulses Skin: No rashes, lesions or ulcers  Psychiatry: Judgement and insight appear normal. Mood & affect appropriate.      Data Reviewed: CBC: Recent Labs  Lab 04/14/17 2019 04/15/17 0541 04/16/17 0449 04/17/17 0428 04/18/17 0638  WBC 8.3 7.4 6.4 6.4 7.0  NEUTROABS  --  3.1 2.5 2.0 2.5  HGB 9.1* 8.3* 8.3* 8.4* 8.3*  HCT 32.6* 29.2* 29.4* 29.6* 29.6*  MCV 73.1* 73.0* 72.6*  73.1* 72.5*  PLT 307 265 282 353 333   Basic Metabolic Panel: Recent Labs  Lab 04/14/17 2019 04/15/17 0541 04/16/17 0449 04/17/17 0428  NA 139 138 137 137  K 4.0 4.0 4.0 3.9  CL 107 107 106 106  CO2 24 24 21* 24  GLUCOSE 124* 101* 89 95  BUN 11 10 10 9   CREATININE 0.83 0.84 0.91 0.99  CALCIUM 8.7* 8.3* 8.1* 8.1*   GFR: Estimated  Creatinine Clearance: 136.5 mL/min (by C-G formula based on SCr of 0.99 mg/dL). Liver Function Tests: Recent Labs  Lab 04/14/17 2019 04/15/17 0541  AST 24 24  ALT 20 20  ALKPHOS 62 51  BILITOT 0.2* 0.4  PROT 7.4 6.6  ALBUMIN 3.4* 3.0*   Recent Labs  Lab 04/14/17 2019  LIPASE 25   No results for input(s): AMMONIA in the last 168 hours. Coagulation Profile: No results for input(s): INR, PROTIME in the last 168 hours. Cardiac Enzymes: No results for input(s): CKTOTAL, CKMB, CKMBINDEX, TROPONINI in the last 168 hours. BNP (last 3 results) No results for input(s): PROBNP in the last 8760 hours. HbA1C: No results for input(s): HGBA1C in the last 72 hours. CBG: No results for input(s): GLUCAP in the last 168 hours. Lipid Profile: No results for input(s): CHOL, HDL, LDLCALC, TRIG, CHOLHDL, LDLDIRECT in the last 72 hours. Thyroid Function Tests: No results for input(s): TSH, T4TOTAL, FREET4, T3FREE, THYROIDAB in the last 72 hours. Anemia Panel: No results for input(s): VITAMINB12, FOLATE, FERRITIN, TIBC, IRON, RETICCTPCT in the last 72 hours. Urine analysis:    Component Value Date/Time   COLORURINE YELLOW 04/14/2017 1950   APPEARANCEUR CLEAR 04/14/2017 1950   LABSPEC 1.024 04/14/2017 1950   PHURINE 6.0 04/14/2017 1950   GLUCOSEU NEGATIVE 04/14/2017 1950   HGBUR NEGATIVE 04/14/2017 1950   BILIRUBINUR NEGATIVE 04/14/2017 1950   KETONESUR NEGATIVE 04/14/2017 1950   PROTEINUR NEGATIVE 04/14/2017 1950   UROBILINOGEN 0.2 10/05/2014 0028   NITRITE NEGATIVE 04/14/2017 1950   LEUKOCYTESUR NEGATIVE 04/14/2017 1950   Sepsis Labs: @LABRCNTIP (procalcitonin:4,lacticidven:4)  )No results found for this or any previous visit (from the past 240 hour(s)).    Studies: No results found.  Scheduled Meds: . feeding supplement  1 Container Oral BID BM  . feeding supplement (PRO-STAT SUGAR FREE 64)  30 mL Oral TID BM  . heparin  5,000 Units Subcutaneous Q8H  . pantoprazole (PROTONIX)  IV  40 mg Intravenous Q24H    Continuous Infusions: . sodium chloride 75 mL/hr at 04/18/17 0514  . ciprofloxacin Stopped (04/18/17 1229)  . metronidazole 500 mg (04/18/17 1812)     LOS: 4 days     Kathie Dike, MD [facility]  To reach me or the doctor on call, go to: [email or web address] Password [facility]  04/18/2017, 6:21 PM

## 2017-04-18 NOTE — Progress Notes (Signed)
[facility] Progress Note     Subjective: Doing fair. Had some nausea. No vomiting. CT with improvement.   Objective: Vital signs in last 24 hours: Temp:  [98.5 F (36.9 C)-99.1 F (37.3 C)] 98.5 F (36.9 C) (02/22 0500) Pulse Rate:  [80-92] 80 (02/22 0500) Resp:  [16-24] 16 (02/22 0500) BP: (114-126)/(50-65) 122/50 (02/22 0500) SpO2:  [93 %-99 %] 95 % (02/22 0500) Last BM Date: (unknown per patient )  Intake/Output from previous day: 02/21 0701 - 02/22 0700 In: 2590 [P.O.:240; I.V.:1650; IV Piggyback:700] Out: 2800 [Urine:2800] Intake/Output this shift: Total I/O In: 1030 [P.O.:1030] Out: 1100 [Urine:1100]  General appearance: alert, cooperative and no distress Resp: normal work breathing GI: soft, nondistended, LLQ tenderness  Lab Results:  Recent Labs    04/17/17 0428 04/18/17 0638  WBC 6.4 7.0  HGB 8.4* 8.3*  HCT 29.6* 29.6*  PLT 353 310   BMET Recent Labs    04/16/17 0449 04/17/17 0428  NA 137 137  K 4.0 3.9  CL 106 106  CO2 21* 24  GLUCOSE 89 95  BUN 10 9  CREATININE 0.91 0.99  CALCIUM 8.1* 8.1*    Anti-infectives: Anti-infectives (From admission, onward)   Start     Dose/Rate Route Frequency Ordered Stop   04/15/17 1000  ciprofloxacin (CIPRO) IVPB 400 mg    Comments:  May adjust dose to patient's weight. Thank you.   400 mg 200 mL/hr over 60 Minutes Intravenous Every 12 hours 04/15/17 0502     04/15/17 0900  metroNIDAZOLE (FLAGYL) IVPB 500 mg    Comments:  May adjust dose to patient's body weight.  Thank you.   500 mg 100 mL/hr over 60 Minutes Intravenous Every 8 hours 04/15/17 0502     04/14/17 2215  ciprofloxacin (CIPRO) IVPB 400 mg     400 mg 200 mL/hr over 60 Minutes Intravenous  Once 04/14/17 2202 04/14/17 2323   04/14/17 2215  metroNIDAZOLE (FLAGYL) IVPB 500 mg     500 mg 100 mL/hr over 60 Minutes Intravenous  Once 04/14/17 2202 04/15/17 0107      Assessment/Plan: Marilyn Rivera is a 44 yofemalewith  diverticulitis with multiple recurrences. CT without signs of fistula. Has inflammation around the bladder.  -Soft diet -Can probably d/c home tomorrow with 14 day course of antibiotics    LOS: 4 days    Virl Cagey 04/18/2017

## 2017-04-19 LAB — CBC WITH DIFFERENTIAL/PLATELET
BASOS ABS: 0 10*3/uL (ref 0.0–0.1)
BASOS PCT: 0 %
Eosinophils Absolute: 0.3 10*3/uL (ref 0.0–0.7)
Eosinophils Relative: 5 %
HEMATOCRIT: 29.4 % — AB (ref 36.0–46.0)
HEMOGLOBIN: 8.4 g/dL — AB (ref 12.0–15.0)
Lymphocytes Relative: 47 %
Lymphs Abs: 2.8 10*3/uL (ref 0.7–4.0)
MCH: 20.8 pg — ABNORMAL LOW (ref 26.0–34.0)
MCHC: 28.6 g/dL — ABNORMAL LOW (ref 30.0–36.0)
MCV: 72.8 fL — AB (ref 78.0–100.0)
MONO ABS: 0.9 10*3/uL (ref 0.1–1.0)
Monocytes Relative: 14 %
NEUTROS ABS: 2.1 10*3/uL (ref 1.7–7.7)
NEUTROS PCT: 34 %
Platelets: 363 10*3/uL (ref 150–400)
RBC: 4.04 MIL/uL (ref 3.87–5.11)
RDW: 19.4 % — AB (ref 11.5–15.5)
WBC: 6.2 10*3/uL (ref 4.0–10.5)

## 2017-04-19 MED ORDER — CIPROFLOXACIN HCL 500 MG PO TABS: 500.0000 mg | ORAL_TABLET | Freq: Two times a day (BID) | ORAL | 0 refills | Status: AC

## 2017-04-19 MED ORDER — POLYETHYLENE GLYCOL 3350 17 G PO PACK: 17.0000 g | PACK | Freq: Every day | ORAL | Status: DC

## 2017-04-19 MED ORDER — PROMETHAZINE HCL 12.5 MG PO TABS: 12.5000 mg | ORAL_TABLET | Freq: Four times a day (QID) | ORAL | 0 refills | Status: DC | PRN

## 2017-04-19 MED ORDER — OXYCODONE-ACETAMINOPHEN 5-325 MG PO TABS: 1.0000 | ORAL_TABLET | Freq: Four times a day (QID) | ORAL | 0 refills | Status: DC | PRN

## 2017-04-19 MED ORDER — METRONIDAZOLE 500 MG PO TABS: 500.0000 mg | ORAL_TABLET | Freq: Three times a day (TID) | ORAL | 0 refills | Status: AC

## 2017-04-19 NOTE — Progress Notes (Signed)
[facility] Progress Note     Subjective: No major issues. Pain improved. Took in significant po.   Objective: Vital signs in last 24 hours: Temp:  [98.2 F (36.8 C)-98.8 F (37.1 C)] 98.6 F (37 C) (02/23 0446) Pulse Rate:  [78-97] 78 (02/23 0446) Resp:  [18] 18 (02/23 0446) BP: (117-129)/(52-63) 117/60 (02/23 0446) SpO2:  [93 %-97 %] 93 % (02/23 0446) Last BM Date: 04/13/17  Intake/Output from previous day: 02/22 0701 - 02/23 0700 In: 1630 [P.O.:1630] Out: 2425 [Urine:2425] Intake/Output this shift: Total I/O In: -  Out: 900 [Urine:900]  General appearance: alert, cooperative and no distress Resp: normal work of breathing GI: soft, minimal tender LLQ and suprapubic  Extremities: extremities normal, atraumatic, no cyanosis or edema  Lab Results:  Recent Labs    04/18/17 0638 04/19/17 0655  WBC 7.0 6.2  HGB 8.3* 8.4*  HCT 29.6* 29.4*  PLT 310 363   BMET Recent Labs    04/17/17 0428  NA 137  K 3.9  CL 106  CO2 24  GLUCOSE 95  BUN 9  CREATININE 0.99  CALCIUM 8.1*    Studies/Results: Ct Abdomen Pelvis W Contrast  Result Date: 04/17/2017 CLINICAL DATA:  Left lower quadrant abdominal pain. Assess for colovesical fistula. History of diverticulitis. EXAM: CT ABDOMEN AND PELVIS WITH CONTRAST TECHNIQUE: Multidetector CT imaging of the abdomen and pelvis was performed using the standard protocol following bolus administration of intravenous contrast. CONTRAST:  150mL ISOVUE-300 IOPAMIDOL (ISOVUE-300) INJECTION 61% COMPARISON:  04/11/2017.  07/31/2016.  04/19/2016. FINDINGS: Lower chest: Areas of linear atelectasis in both lower lungs. No pleural fluid. Hepatobiliary: Normal without contrast.  No calcified gallstones. Pancreas: Normal Spleen: Normal Adrenals/Urinary Tract: Adrenal glands are normal. Kidneys are normal. Re demonstrated is an area of thickening along the left side of the bladder, with a small air/gas collection. This appears very  similar with maximal thickness of the process measuring about 2.5 cm. This is felt to be due to inflammation of the left side of the bladder secondary to adjacent sigmoid diverticulitis. Again, there is no air/gas free within the bladder. Rectal contrast does not leave the confines of the sigmoid colon and therefore no active/patent fistula is demonstrated. Stomach/Bowel: As noted above, rectal contrast is given. There is no extension of contrast from the sigmoid colon that would allow diagnosis fistula. There is diverticulosis and diverticulitis in the sigmoid region which is improving on the basis of slightly less surrounding stranding in the fat. No new collection or new finding. Vascular/Lymphatic: Normal Reproductive: Uterus and adnexal regions appear normal. Other: No free fluid or air. Musculoskeletal: Chronic lower lumbar degenerative changes. IMPRESSION: Similar appearance to the study of 6 days ago. Chronic thickening of the left bladder wall. Small air/gas collection along the lateral margin. This is presumed to represent lateral wall cystitis secondary to adjacent diverticulitis. The diverticulitis itself appears to be improving with less surrounding edema. Rectal contrast does not show any extravasation or extension into a demonstrable fistula. There is no gas free within the bladder. Electronically Signed   By: Nelson Chimes M.D.   On: 04/17/2017 13:25    Anti-infectives: Anti-infectives (From admission, onward)   Start     Dose/Rate Route Frequency Ordered Stop   04/15/17 1000  ciprofloxacin (CIPRO) IVPB 400 mg    Comments:  May adjust dose to patient's weight. Thank you.   400 mg 200 mL/hr over 60 Minutes Intravenous Every 12 hours 04/15/17 0502     04/15/17 0900  metroNIDAZOLE (FLAGYL) IVPB 500 mg    Comments:  May adjust dose to patient's body weight.  Thank you.   500 mg 100 mL/hr over 60 Minutes Intravenous Every 8 hours 04/15/17 0502     04/14/17 2215  ciprofloxacin (CIPRO) IVPB 400  mg     400 mg 200 mL/hr over 60 Minutes Intravenous  Once 04/14/17 2202 04/14/17 2323   04/14/17 2215  metroNIDAZOLE (FLAGYL) IVPB 500 mg     500 mg 100 mL/hr over 60 Minutes Intravenous  Once 04/14/17 2202 04/15/17 0107      Assessment/Plan: Marilyn Rivera is a 37 yo with recurrent diverticulitis. Improving with IV antibiotics.  -Complete course for total for 14 days of antibiotics  -Follow up in the clinic to discuss outpatient surgery planning, 05/08/2017  -Return if worsening pain, fevers, etc, and will have to likely get an ostomy    LOS: 5 days    Virl Cagey 04/19/2017

## 2017-04-19 NOTE — Discharge Summary (Signed)
Physician Discharge Summary  ANGELLYNN KIMBERLIN CXK:[id number] DOB: [birth year] DOA: 04/14/2017  PCP: Patient, No Pcp Per  Admit date: 04/14/2017 Discharge date: 04/19/2017  Admitted From: Home Disposition: Home  Recommendations for Outpatient Follow-up:  1. Follow up with PCP in 1-2 weeks 2. Please obtain BMP/CBC in one week 3. Patient has been scheduled to follow-up with general surgery  Discharge Condition: Stable CODE STATUS: Full code Diet recommendation: Heart Healthy   Brief/Interim Summary: 37 year old female past medical history of diverticulosis/diverticulitis and morbid obesity diagnosed with diverticulitis on 2/15 and started on oral antibiotics without improvement. Patient had fever and increasing pain in her left lower quadrant as well as suprapubic area with bowel movements and urination. She came into the emergency room on the evening of 2/18 and a CT scan noted signs consistent with acute diverticulitis, but also area concerning for possible fistula connecting her colon to her bladder. No evidence of sepsis.  other labs normal. Patient started on IV antibiotics. General surgery consulted who recommended at this time given overall stability for conservative treatment of her diverticulitis.  CT scan was repeated later in her hospital course which did show improvement of diverticulitis and surrounding edema.  Rectal contrast did not show any extravasation or extension into a demonstrable fistula.  It was recommended to continue total of 14 days of antibiotics.  Patient's diet was advanced she was able to tolerate this.  Overall abdominal pain is doing better.  She plans to follow-up with general surgery as an outpatient.  She will likely need a colon resection due to her repeated episodes of diverticulitis.    Discharge Diagnoses:  Principal Problem:   Acute diverticulitis Active Problems:   GERD (gastroesophageal reflux disease)   Iron deficiency anemia   Microcytic  anemia    Discharge Instructions  Discharge Instructions    Diet - low sodium heart healthy   Complete by:  As directed    Increase activity slowly   Complete by:  As directed      Allergies as of 04/19/2017      Reactions   Coconut Fatty Acids       Medication List    STOP taking these medications   HYDROcodone-acetaminophen 5-325 MG tablet Commonly known as:  NORCO/VICODIN   oseltamivir 75 MG capsule Commonly known as:  TAMIFLU     TAKE these medications   ciprofloxacin 500 MG tablet Commonly known as:  CIPRO Take 1 tablet (500 mg total) by mouth 2 (two) times daily for 10 days.   lactulose 10 GM/15ML solution Commonly known as:  CHRONULAC Take 15 mLs (10 g total) by mouth 3 (three) times daily as needed for moderate constipation.   metroNIDAZOLE 500 MG tablet Commonly known as:  FLAGYL Take 1 tablet (500 mg total) by mouth 3 (three) times daily for 10 days.   omeprazole 20 MG capsule Commonly known as:  PRILOSEC TAKE (1) CAPSULE DAILY BEFORE BREAKFAST.   oxyCODONE-acetaminophen 5-325 MG tablet Commonly known as:  PERCOCET/ROXICET Take 1-2 tablets by mouth every 6 (six) hours as needed for severe pain.   promethazine 12.5 MG tablet Commonly known as:  PHENERGAN Take 1 tablet (12.5 mg total) by mouth every 6 (six) hours as needed for nausea or vomiting.      Follow-up Information    Virl Cagey, MD Follow up on 05/08/2017.   Specialty:  General Surgery Contact information: [address] [place] [facility] [address] [phone]          Allergies  Allergen Reactions  . Coconut Fatty Acids     Consultations:  General surgery   Procedures/Studies: Dg Chest 2 View  Result Date: 04/03/2017 CLINICAL DATA:  Shortness of breath with cough and congestion. Fever. EXAM: CHEST  2 VIEW COMPARISON:  October 27, 2014 FINDINGS: Lungs are clear. Heart size and pulmonary vascularity are normal. No adenopathy. No bone lesions. IMPRESSION: No edema or  consolidation. Electronically Signed   By: Lowella Grip III M.D.   On: 04/03/2017 14:35   Ct Abdomen Pelvis W Contrast  Result Date: 04/17/2017 CLINICAL DATA:  Left lower quadrant abdominal pain. Assess for colovesical fistula. History of diverticulitis. EXAM: CT ABDOMEN AND PELVIS WITH CONTRAST TECHNIQUE: Multidetector CT imaging of the abdomen and pelvis was performed using the standard protocol following bolus administration of intravenous contrast. CONTRAST:  178mL ISOVUE-300 IOPAMIDOL (ISOVUE-300) INJECTION 61% COMPARISON:  04/11/2017.  07/31/2016.  04/19/2016. FINDINGS: Lower chest: Areas of linear atelectasis in both lower lungs. No pleural fluid. Hepatobiliary: Normal without contrast.  No calcified gallstones. Pancreas: Normal Spleen: Normal Adrenals/Urinary Tract: Adrenal glands are normal. Kidneys are normal. Re demonstrated is an area of thickening along the left side of the bladder, with a small air/gas collection. This appears very similar with maximal thickness of the process measuring about 2.5 cm. This is felt to be due to inflammation of the left side of the bladder secondary to adjacent sigmoid diverticulitis. Again, there is no air/gas free within the bladder. Rectal contrast does not leave the confines of the sigmoid colon and therefore no active/patent fistula is demonstrated. Stomach/Bowel: As noted above, rectal contrast is given. There is no extension of contrast from the sigmoid colon that would allow diagnosis fistula. There is diverticulosis and diverticulitis in the sigmoid region which is improving on the basis of slightly less surrounding stranding in the fat. No new collection or new finding. Vascular/Lymphatic: Normal Reproductive: Uterus and adnexal regions appear normal. Other: No free fluid or air. Musculoskeletal: Chronic lower lumbar degenerative changes. IMPRESSION: Similar appearance to the study of 6 days ago. Chronic thickening of the left bladder wall. Small  air/gas collection along the lateral margin. This is presumed to represent lateral wall cystitis secondary to adjacent diverticulitis. The diverticulitis itself appears to be improving with less surrounding edema. Rectal contrast does not show any extravasation or extension into a demonstrable fistula. There is no gas free within the bladder. Electronically Signed   By: Nelson Chimes M.D.   On: 04/17/2017 13:25   Ct Abdomen Pelvis W Contrast  Result Date: 04/11/2017 CLINICAL DATA:  Lower abdominal pain and nausea for 1 week. History of renal calculi. EXAM: CT ABDOMEN AND PELVIS WITH CONTRAST TECHNIQUE: Multidetector CT imaging of the abdomen and pelvis was performed using the standard protocol following bolus administration of intravenous contrast. 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 There is gas and fluid in a collection along the notably thickened urinary bladder wall as on image 79/2 and images 39-34 of series 5. I do not see any definite gas within the urinary bladder lumen currently. Vascular/Lymphatic: Small retroperitoneal and external iliac lymph nodes are probably reactive one of the largest is a 1.1 cm right external iliac node on image 77/2 (previously 0.9 cm). Reproductive: Mildly retroverted  uterus. Other: Suspected trace amount of free pelvic fluid along the left cul-de-sac. Musculoskeletal: Spondylosis and degenerative disc disease at L4-5 and L5-S1, causing left foraminal impingement at both levels and right foraminal impingement at L4-5. IMPRESSION: 1. Inflamed, thick-walled cavity along the left serosal margin  of the urinary bladder contains gas and fluid. There is a fistulous connection between this cavity and the sigmoid colon, along with wall thickening, diverticulosis, and likely diverticulitis of the sigmoid colon. Prominent wall thickening of the adjacent urinary bladder, some of which may be reactive; there is no current gas in the urinary bladder to suggest a fistula into the urinary bladder lumen, although correlation with any pyuria is recommended. 2. Please note that the inflamed segment of sigmoid colon is similar to the prior exam of June 2018. While this is probably from diverticulitis, an underlying sigmoid colon tumor is not totally excluded. Given the patient's age, colon cancer seems less likely but should be considered as a remote possibility. 3. Lower lumbar impingement due to spondylosis and degenerative disc disease. These results will be called to the ordering clinician or representative by the Radiologist Assistant, and communication documented in the PACS or zVision Dashboard. Electronically Signed   By: Van Clines M.D.   On: 04/11/2017 12:07   US Venous Img Lower Bilateral  Result Date: 04/15/2017 CLINICAL DATA:  Bilateral lower extremity edema, left greater than right. History of morbid obesity. Evaluate for DVT. EXAM: BILATERAL LOWER EXTREMITY VENOUS DOPPLER ULTRASOUND TECHNIQUE: Gray-scale sonography with graded compression, as well as color Doppler and duplex ultrasound were performed to evaluate the lower extremity deep venous systems from the level of the common femoral vein and including the common femoral, femoral, profunda femoral, popliteal and calf veins including the posterior tibial, peroneal and gastrocnemius veins when visible. The superficial great saphenous vein was also interrogated. Spectral Doppler was utilized to evaluate flow at rest and with distal augmentation maneuvers in the common femoral, femoral and popliteal veins. COMPARISON:  None. FINDINGS: RIGHT LOWER  EXTREMITY Common Femoral Vein: No evidence of thrombus. Normal compressibility, respiratory phasicity and response to augmentation. Saphenofemoral Junction: No evidence of thrombus. Normal compressibility and flow on color Doppler imaging. Profunda Femoral Vein: No evidence of thrombus. Normal compressibility and flow on color Doppler imaging. Femoral Vein: No evidence of thrombus. Normal compressibility, respiratory phasicity and response to augmentation. Popliteal Vein: No evidence of thrombus. Normal compressibility, respiratory phasicity and response to augmentation. Calf Veins: No evidence of thrombus. Normal compressibility and flow on color Doppler imaging. Superficial Great Saphenous Vein: No evidence of thrombus. Normal compressibility. Venous Reflux:  None. Other Findings:  None. LEFT LOWER EXTREMITY Common Femoral Vein: No evidence of thrombus. Normal compressibility, respiratory phasicity and response to augmentation. Saphenofemoral Junction: No evidence of thrombus. Normal compressibility and flow on color Doppler imaging. Profunda Femoral Vein: No evidence of thrombus. Normal compressibility and flow on color Doppler imaging. Femoral Vein: No evidence of thrombus. Normal compressibility, respiratory phasicity and response to augmentation. Popliteal Vein: No evidence of thrombus. Normal compressibility, respiratory phasicity and response to augmentation. Calf Veins: No evidence of thrombus. Normal compressibility and flow on color Doppler imaging. Superficial Great Saphenous Vein: No evidence of thrombus. Normal compressibility. Venous Reflux:  None. Other Findings:  None. IMPRESSION: No evidence of DVT within either lower extremity. Electronically Signed   By: Sandi Mariscal M.D.   On: 04/15/2017 11:46   Dg Abdomen Acute W/chest  Result Date: 04/14/2017 CLINICAL DATA:  Left lower abdominal pain and nausea. EXAM:  DG ABDOMEN ACUTE W/ 1V CHEST COMPARISON:  CT 02/08/2018 FINDINGS: The cardiomediastinal  contours are normal. The lungs are clear. There is no free intra-abdominal air. There is enteric contrast throughout the colon from prior CT. Moderate stool in the more proximal colon. No bowel dilatation to suggest obstruction. Small focus of air to the left of the rectosigmoid colon may reflect the subserosal bladder air as seen on CT versus normal bowel gas. No radiopaque calculi. No acute osseous abnormalities are seen. IMPRESSION: 1. No free air to suggest perforation.  No bowel obstruction. 2. Air to the left of the sigmoid colon may be the subserosal bladder air is seen on CT versus adjacent bowel gas. 3. Enteric contrast in the descending colon from prior CT. 4. Clear lungs. Electronically Signed   By: Jeb Levering M.D.   On: 04/14/2017 23:52      Subjective: Feeling better.  Abdominal pain is improving.  Tolerating diet.  Discharge Exam: Vitals:   04/18/17 2030 04/19/17 0446  BP: (!) 117/52 117/60  Pulse: 97 78  Resp: 18 18  Temp: 98.8 F (37.1 C) 98.6 F (37 C)  SpO2: 97% 93%   Vitals:   04/18/17 0500 04/18/17 1500 04/18/17 2030 04/19/17 0446  BP: (!) 122/50 129/63 (!) 117/52 117/60  Pulse: 80 85 97 78  Resp: 16 18 18 18   Temp: 98.5 F (36.9 C) 98.2 F (36.8 C) 98.8 F (37.1 C) 98.6 F (37 C)  TempSrc: Oral Oral Oral Oral  SpO2: 95% 96% 97% 93%  Weight:      Height:        General: Pt is alert, awake, not in acute distress Cardiovascular: RRR, S1/S2 +, no rubs, no gallops Respiratory: CTA bilaterally, no wheezing, no rhonchi Abdominal: Soft, some tenderness in left lower quadrant, obese, positive bowel sounds Extremities: no edema, no cyanosis    The results of significant diagnostics from this hospitalization (including imaging, microbiology, ancillary and laboratory) are listed below for reference.     Microbiology: No results found for this or any previous visit (from the past 240 hour(s)).   Labs: BNP (last 3 results) No results for input(s): BNP  in the last 8760 hours. Basic Metabolic Panel: Recent Labs  Lab 04/14/17 2019 04/15/17 0541 04/16/17 0449 04/17/17 0428  NA 139 138 137 137  K 4.0 4.0 4.0 3.9  CL 107 107 106 106  CO2 24 24 21* 24  GLUCOSE 124* 101* 89 95  BUN 11 10 10 9   CREATININE 0.83 0.84 0.91 0.99  CALCIUM 8.7* 8.3* 8.1* 8.1*   Liver Function Tests: Recent Labs  Lab 04/14/17 2019 04/15/17 0541  AST 24 24  ALT 20 20  ALKPHOS 62 51  BILITOT 0.2* 0.4  PROT 7.4 6.6  ALBUMIN 3.4* 3.0*   Recent Labs  Lab 04/14/17 2019  LIPASE 25   No results for input(s): AMMONIA in the last 168 hours. CBC: Recent Labs  Lab 04/15/17 0541 04/16/17 0449 04/17/17 0428 04/18/17 0638 04/19/17 0655  WBC 7.4 6.4 6.4 7.0 6.2  NEUTROABS 3.1 2.5 2.0 2.5 2.1  HGB 8.3* 8.3* 8.4* 8.3* 8.4*  HCT 29.2* 29.4* 29.6* 29.6* 29.4*  MCV 73.0* 72.6* 73.1* 72.5* 72.8*  PLT 265 282 353 310 363   Cardiac Enzymes: No results for input(s): CKTOTAL, CKMB, CKMBINDEX, TROPONINI in the last 168 hours. BNP: Invalid input(s): POCBNP CBG: No results for input(s): GLUCAP in the last 168 hours. D-Dimer No results for input(s): DDIMER in the last 72 hours.  Hgb A1c No results for input(s): HGBA1C in the last 72 hours. Lipid Profile No results for input(s): CHOL, HDL, LDLCALC, TRIG, CHOLHDL, LDLDIRECT in the last 72 hours. Thyroid function studies No results for input(s): TSH, T4TOTAL, T3FREE, THYROIDAB in the last 72 hours.  Invalid input(s): FREET3 Anemia work up No results for input(s): VITAMINB12, FOLATE, FERRITIN, TIBC, IRON, RETICCTPCT in the last 72 hours. Urinalysis    Component Value Date/Time   COLORURINE YELLOW 04/14/2017 1950   APPEARANCEUR CLEAR 04/14/2017 1950   LABSPEC 1.024 04/14/2017 1950   PHURINE 6.0 04/14/2017 1950   GLUCOSEU NEGATIVE 04/14/2017 1950   HGBUR NEGATIVE 04/14/2017 1950   BILIRUBINUR NEGATIVE 04/14/2017 1950   KETONESUR NEGATIVE 04/14/2017 1950   PROTEINUR NEGATIVE 04/14/2017 1950    UROBILINOGEN 0.2 10/05/2014 0028   NITRITE NEGATIVE 04/14/2017 1950   LEUKOCYTESUR NEGATIVE 04/14/2017 1950   Sepsis Labs Invalid input(s): PROCALCITONIN,  WBC,  LACTICIDVEN Microbiology No results found for this or any previous visit (from the past 240 hour(s)).   Time coordinating discharge: Over 30 minutes  SIGNED:   Kathie Dike, MD  [facility] 04/19/2017, 6:03 PM Pager   If 7PM-7AM, please contact night-coverage [email or web address] Password [email or web address]

## 2017-04-19 NOTE — Progress Notes (Signed)
Discharge instructions gone over with patient, verbalized understanding. IV removed, patient tolerated procedure well. 

## 2017-04-19 NOTE — Discharge Instructions (Signed)

## 2017-04-24 ENCOUNTER — Telehealth: Payer: Self-pay | Admitting: General Surgery

## 2017-04-24 MED ORDER — OXYCODONE-ACETAMINOPHEN 5-325 MG PO TABS: 1.0000 | ORAL_TABLET | Freq: Four times a day (QID) | ORAL | 0 refills | Status: DC | PRN

## 2017-04-24 MED ORDER — PROMETHAZINE HCL 12.5 MG PO TABS: 12.5000 mg | ORAL_TABLET | Freq: Four times a day (QID) | ORAL | 0 refills | Status: DC | PRN

## 2017-04-24 NOTE — Telephone Encounter (Addendum)
[facility]  Patient called office. Requested me to call after 5pm. No answer and message left.   Patient says when she getting antibiotics through IV she was better. But now with the po she is still hurting. Scared to eat. Eats light. Staying hydrated with water and ginger ale.   No energy. Not feeling well. Fever to 102.   If she is continuing to get worse, she will need to go back to the ED.   Has an appt with me on 3/19 @ 9:45AM.   Refilled her phenergan and her Percocet (#15 tab)   Curlene Labrum, MD [facility] [address] [place], [place] [address] [phone] (office)

## 2017-05-13 ENCOUNTER — Encounter: Payer: Self-pay | Admitting: General Surgery

## 2017-05-13 ENCOUNTER — Ambulatory Visit: Payer: BLUE CROSS/BLUE SHIELD | Admitting: General Surgery

## 2017-05-13 VITALS — BP 167/73 | HR 71 | Temp 97.7°F | Resp 18 | Ht 69.0 in | Wt 390.0 lb

## 2017-05-13 DIAGNOSIS — K5792 Diverticulitis of intestine, part unspecified, without perforation or abscess without bleeding: Secondary | ICD-10-CM | POA: Diagnosis not present

## 2017-05-13 NOTE — Progress Notes (Signed)
[facility] History and Physical  Reason for Referral: Multiple episodes of diverticulitis  Referring Physician:  Hospital Follow up   Chief Complaint    Follow-up      Marilyn Rivera is a 37 y.o. female.  HPI:  Marilyn Rivera is a 37 yo female with morbid obestiy, GERD, anemia, who presented to the hospital a few weeks back with diverticulitis and then ultimately had to get admitted for IV antibiotics and bowel rest.  She has had multiple episodes in the past with at least 4 requiring hospitilization. She says that she has never had a perforation/ abscess requiring drain, but that these multiple episodes are severely limiting her life and causing her to miss a significant amount of work. he had a colonoscopy at 37 yo with Dr. Oneida Alar, and was noted to have diverticulosis and hemorrhoids.  She has seen other surgeons in the past who did not think she need to get surgery for the diverticulitis at that time.  She is much improved but still has some discomfort in the pubic area.  On her initial CT there was some concern for possible colovesical fistula, but clinically she has never had any sediment or pneumaturia.   She denies any recent fevers or chills, and says that she is ready to get her surgery.  She completed her course of antibiotics.  She has been having regular BMs but did start to have some diarrhea the last few days.   Past Medical History:  Diagnosis Date  . Diverticulitis large intestine w/o perforation or abscess w/o bleeding 10/05/2014  . Diverticulosis   . GERD (gastroesophageal reflux disease)   . Helicobacter pylori gastritis 09/06/2014  . Iron deficiency anemia   . Kidney stones     Past Surgical History:  Procedure Laterality Date  . COLONOSCOPY N/A 06/10/2014   Dr. Oneida Alar; redundant sigmoid colon, moderate diverticulosis in the sigmoid and descending colon. small internal hemorrhoids. Next screening at age 29.   Marland Kitchen ESOPHAGOGASTRODUODENOSCOPY N/A 06/10/2014   Dr. Oneida Alar: H.pylori gastritis s/p treatment with Amoxicillin and Biaxin  . KNEE SURGERY Right   . renal calculi removal Left 2010  . SKIN LESION EXCISION     over right eyebrow due to wax being left above eye and it seeped down into pore    Family History  Problem Relation Age of Onset  . Heart failure Mother   . Cancer Other        mother's side of family, breast cancer  . Cancer Other        father's side of family, leukemia  . Hypertension Father   . Stroke Father   . Diabetes Other   . Hypertension Other   . Colon cancer Cousin     Social History   Tobacco Use  . Smoking status: Never Smoker  . Smokeless tobacco: Never Used  Substance Use Topics  . Alcohol use: No  . Drug use: No    Medications: I have reviewed the patient's current medications. Prior to Admission:  (Not in a hospital admission) Allergies as of 05/13/2017      Reactions   Coconut Fatty Acids       Medication List        Accurate as of 05/13/17 10:51 AM. Always use your most recent med list.          lactulose 10 GM/15ML solution Commonly known as:  CHRONULAC Take 15 mLs (10 g total) by mouth 3 (three) times daily as needed for  moderate constipation.   omeprazole 20 MG capsule Commonly known as:  PRILOSEC TAKE (1) CAPSULE DAILY BEFORE BREAKFAST.   oxyCODONE-acetaminophen 5-325 MG tablet Commonly known as:  PERCOCET/ROXICET Take 1-2 tablets by mouth every 6 (six) hours as needed for severe pain.   promethazine 12.5 MG tablet Commonly known as:  PHENERGAN Take 1 tablet (12.5 mg total) by mouth every 6 (six) hours as needed for nausea or vomiting.        ROS:  A comprehensive review of systems was negative except for: Gastrointestinal: positive for lower abdominal pain, normal BMs, + blood in stool with hemorrhoids  Blood pressure (!) 167/73, pulse 71, temperature 97.7 F (36.5 C), resp. rate 18, height 5\' 9"  (1.753 m), weight (!) 390 lb (176.9 kg). Physical Exam    Constitutional: She is oriented to person, place, and time and well-developed, well-nourished, and in no distress.  Obese  HENT:  Head: Normocephalic.  Eyes: Pupils are equal, round, and reactive to light.  Neck: Normal range of motion.  Cardiovascular: Normal rate.  Pulmonary/Chest: Effort normal and breath sounds normal.  Abdominal: Soft. She exhibits no distension. There is no tenderness.  Obese, pannus flatter with larger upper abdominal area  Musculoskeletal: Normal range of motion.  Neurological: She is alert and oriented to person, place, and time.  Skin: Skin is warm and dry.  Psychiatric: Mood, memory, affect and judgment normal.  Vitals reviewed.   Results: CT a/p 2/15 IMPRESSION: 1. Inflamed, thick-walled cavity along the left serosal margin of the urinary bladder contains gas and fluid. There is a fistulous connection between this cavity and the sigmoid colon, along with wall thickening, diverticulosis, and likely diverticulitis of the sigmoid colon. Prominent wall thickening of the adjacent urinary bladder, some of which may be reactive; there is no current gas in the urinary bladder to suggest a fistula into the urinary bladder lumen, although correlation with any pyuria is recommended. 2. Please note that the inflamed segment of sigmoid colon is similar to the prior exam of June 2018. While this is probably from diverticulitis, an underlying sigmoid colon tumor is not totally excluded. Given the patient's age, colon cancer seems less likely but should be considered as a remote possibility. 3. Lower lumbar impingement due to spondylosis and degenerative disc disease.  These results will be called to the ordering clinician or representative by the Radiologist Assistant, and communication documented in the PACS or zVision Dashboard.  Assessment & Plan:  Marilyn Rivera is a 37 y.o. female with a history of multiple uncomplicated episodes of diverticulitis that  are starting to cause issues with her life and job. She has been hospitalized 4 times, and really wants to avoid continued hospitalization in the future.  -Discussed extensively the difficulty of surgery given her obesity and the difficulty with doing any type of ostomy given her obesity  -If able to do an elective surgery and get her anastomosed this would be the best for the patient.  -Will discuss the patient's size with my partner and will have anesthesia see her in her preoperative visit due to her size to assess her airway and given the upper abdominal obesity   All questions were answered to the satisfaction of the patient and family.  The risk and benefits of laparoscopic hand assisted sigmoid colectomy possible open were discussed including but not limited to bleeding, infection, injury to other organs, leak from anastomosis, need for ostomy, ureter injury.  After careful consideration, DELARA SHEPHEARD has decided to  proceed.    Virl Cagey 05/13/2017, 10:51 AM

## 2017-05-13 NOTE — Patient Instructions (Signed)
Laparoscopic Colectomy Laparoscopic colectomy is surgery to remove part or all of the large intestine (colon). This procedure may be used to treat several conditions, including:  Inflammation and infection of the colon (diverticulitis).  Tumors or masses in the colon.  Inflammatory bowel disease, such as Crohn disease or ulcerative colitis. Colectomy is an option when symptoms cannot be controlled with medicines.  Bleeding from the colon that cannot be controlled by another method.  Blockage or obstruction of the colon.  Tell a health care provider about:  Any allergies you have.  All medicines you are taking, including vitamins, herbs, eye drops, creams, and over-the-counter medicines.  Any problems you or family members have had with anesthetic medicines.  Any blood disorders you have.  Any surgeries you have had.  Any medical conditions you have. What are the risks? Generally, this is a safe procedure. However, problems may occur, including:  Infection.  Bleeding.  Allergic reactions to medicines or dyes.  Damage to other structures or organs.  Leaking from where the colon was sewn together.  Future blockage of the small intestines from scar tissue. Another surgery may be needed to repair this.  Needing to convert to an open procedure. Complications such as damage to other organs or excessive bleeding may require the surgeon to convert from a laparoscopic procedure to an open procedure. This involves making a larger incision in the abdomen.  What happens before the procedure? Staying hydrated Follow instructions from your health care provider about hydration, which may include:  Up to 2 hours before the procedure - you may continue to drink clear liquids, such as water, clear fruit juice, black coffee, and plain tea.  Eating and drinking restrictions Follow instructions from your health care provider about eating and drinking, which may include:  8 hours before  the procedure - stop eating heavy meals, meals with high fiber, or foods such as meat, fried foods, or fatty foods.  6 hours before the procedure - stop eating light meals or foods, such as toast or cereal.  6 hours before the procedure - stop drinking milk or drinks that contain milk.  2 hours before the procedure - stop drinking clear liquids.  Medicines  Ask your health care provider about: ? Changing or stopping your regular medicines. This is especially important if you are taking diabetes medicines or blood thinners. ? Taking medicines such as aspirin and ibuprofen. These medicines can thin your blood. Do not take these medicines before your procedure if your health care provider instructs you not to.  You may be given antibiotic medicine to clean out bacteria from your colon. Follow the directions carefully and take the medicine at the correct time. General instructions  You may be prescribed an oral bowel prep to clean out your colon in preparation for the surgery: ? Follow instructions from your health care provider about how to do this. ? Do not eat or drink anything else after you have started the bowel prep, unless your health care provider tells you it is safe to do so.  Do not use any products that contain nicotine or tobacco, such as cigarettes and e-cigarettes. If you need help quitting, ask your health care provider. What happens during the procedure?  To reduce your risk of infection: ? Your health care team will wash or sanitize their hands. ? Your skin will be washed with soap.  An IV tube will be inserted into one of your veins to deliver fluid and medication.  You   will be given one of the following: ? A medicine to help you relax (sedative). ? A medicine to make you fall asleep (general anesthetic).  Small monitors will be connected to your body. They will be used to check your heart, blood pressure, and oxygen level.  A breathing tube may be placed into your  lungs during the procedure.  A thin, flexible tube (catheter) will be placed into your bladder to drain urine.  A tube may be placed through your nose and into your stomach to drain stomach fluids (nasogastric tube, or NG tube).  Your abdomen will be filled with air so it expands. This gives the surgeon more room to operate and makes your organs easier to see.  Several small cuts (incisions) will be made in your abdomen.  A thin, lighted tube with a tiny camera on the end (laparoscope) will be put through one of the small incisions. The camera on the laparoscope will send a picture to a computer screen in the operating room. This will give the surgeon a good view inside your abdomen.  Hollow tubes will be put through the other small incisions in your abdomen. The tools that are needed for the procedure will be put through these tubes.  Clamps or staples will be put on both ends of the diseased part of the colon.  The part of the intestine between the clamps or staples will be removed.  If possible, the ends of the healthy colon that remain will be stitched (sutured) or stapled together to allow your body to pass waste (stool).  Sometimes, the remaining colon cannot be stitched back together. If this is the case, a colostomy will be needed. If you need a colostomy: ? An opening to the outside of your body (stoma) will be made through your abdomen. ? The end of your colon will be brought to the opening. It will be stitched to the skin. ? A bag will be attached to the opening. Stool will drain into this removable bag. ? The colostomy may be temporary or permanent.  The incisions from the colectomy will be closed with sutures or staples. The procedure may vary among health care providers and hospitals. What happens after the procedure?  Your blood pressure, heart rate, breathing rate, and blood oxygen level will be monitored until the medicines you were given have worn off.  You will  receive fluids through an IV tube until your bowels start to work properly.  Once your bowels are working again, you will be given clear liquids first and then solid food as tolerated.  You will be given medicines to control your pain and nausea, if needed.  Do not drive for 24 hours if you were given a sedative. This information is not intended to replace advice given to you by your health care provider. Make sure you discuss any questions you have with your health care provider. Document Released: 05/04/2002 Document Revised: 11/13/2015 Document Reviewed: 11/13/2015 Elsevier Interactive Patient Education  2018 Else[employer]    

## 2017-05-14 ENCOUNTER — Encounter: Payer: Self-pay | Admitting: Family Medicine

## 2017-05-14 ENCOUNTER — Ambulatory Visit (INDEPENDENT_AMBULATORY_CARE_PROVIDER_SITE_OTHER): Payer: BLUE CROSS/BLUE SHIELD | Admitting: Gastroenterology

## 2017-05-14 ENCOUNTER — Ambulatory Visit (INDEPENDENT_AMBULATORY_CARE_PROVIDER_SITE_OTHER): Payer: BLUE CROSS/BLUE SHIELD | Admitting: Family Medicine

## 2017-05-14 ENCOUNTER — Encounter: Payer: Self-pay | Admitting: Gastroenterology

## 2017-05-14 VITALS — BP 128/79 | HR 67 | Temp 97.2°F | Ht 69.0 in | Wt 387.0 lb

## 2017-05-14 DIAGNOSIS — D500 Iron deficiency anemia secondary to blood loss (chronic): Secondary | ICD-10-CM | POA: Diagnosis not present

## 2017-05-14 DIAGNOSIS — B9681 Helicobacter pylori [H. pylori] as the cause of diseases classified elsewhere: Secondary | ICD-10-CM | POA: Diagnosis not present

## 2017-05-14 DIAGNOSIS — Z0000 Encounter for general adult medical examination without abnormal findings: Secondary | ICD-10-CM

## 2017-05-14 DIAGNOSIS — K219 Gastro-esophageal reflux disease without esophagitis: Secondary | ICD-10-CM

## 2017-05-14 DIAGNOSIS — R1310 Dysphagia, unspecified: Secondary | ICD-10-CM

## 2017-05-14 DIAGNOSIS — J302 Other seasonal allergic rhinitis: Secondary | ICD-10-CM

## 2017-05-14 DIAGNOSIS — Z6841 Body Mass Index (BMI) 40.0 and over, adult: Secondary | ICD-10-CM | POA: Diagnosis not present

## 2017-05-14 DIAGNOSIS — Z862 Personal history of diseases of the blood and blood-forming organs and certain disorders involving the immune mechanism: Secondary | ICD-10-CM | POA: Diagnosis not present

## 2017-05-14 DIAGNOSIS — R1319 Other dysphagia: Secondary | ICD-10-CM

## 2017-05-14 DIAGNOSIS — Z7689 Persons encountering health services in other specified circumstances: Secondary | ICD-10-CM

## 2017-05-14 DIAGNOSIS — K5792 Diverticulitis of intestine, part unspecified, without perforation or abscess without bleeding: Secondary | ICD-10-CM

## 2017-05-14 DIAGNOSIS — K2970 Gastritis, unspecified, without bleeding: Secondary | ICD-10-CM | POA: Diagnosis not present

## 2017-05-14 LAB — BAYER DCA HB A1C WAIVED: HB A1C (BAYER DCA - WAIVED): 5.5 % (ref <7.0)

## 2017-05-14 MED ORDER — FLUTICASONE PROPIONATE 50 MCG/ACT NA SUSP: 2.0000 | Freq: Every day | NASAL | 6 refills | Status: DC

## 2017-05-14 MED ORDER — MONTELUKAST SODIUM 10 MG PO TABS: 10.0000 mg | ORAL_TABLET | Freq: Every day | ORAL | 3 refills | Status: DC

## 2017-05-14 NOTE — Progress Notes (Signed)
CC'ED TO PCP 

## 2017-05-14 NOTE — Patient Instructions (Addendum)
DRINK WATER TO KEEP YOUR URINE LIGHT YELLOW.  FOLLOW A HIGH FIBER DIET. SEE INFO BELOW.  PLEASE CALL WITH QUESTIONS OR CONCERNS.  FOLLOW UP IN 6 MOS.

## 2017-05-14 NOTE — Assessment & Plan Note (Signed)
RX 2016.   NEEDS CONFIRMATION OF ERADICATION. WILL TEST AFTER NEXT OPV.

## 2017-05-14 NOTE — Assessment & Plan Note (Signed)
SYMPTOMS CONTROLLED/RESOLVED.  CONTINUE TO MONITOR SYMPTOMS. 

## 2017-05-14 NOTE — Progress Notes (Signed)
Marilyn Rivera is a 37 y.o. female presents to office today to establish care and for annual physical exam examination.    Concerns today include: 1. Work form patient presents for her annual wellness exam.    2.  Seasonal allergies Patient notes that she has had a long-standing history of seasonal allergies.  She is used Claritin, Human resources officer, Zyrtec in the past with little improvement.  She felt Flonase had worked fairly well for her in the past.  She notes rhinorrhea, postnasal drip, frequent clearing of her throat, particularly at nighttime.  She feels like cough is worse at nighttime.  Denies any shortness of breath or wheeze.  No fevers.  Occupation: Librarian, academic at [employer], Marital status: married, Substance use: none Diet: fair, Exercise: no structured Last eye exam: yearly Last dental exam: yearly Last colonoscopy: had w/ GI Last pap smear: 2013. [facility]N in [place] [facility] unsure of name but will call with info. Refills needed today: none  Past Medical History:  Diagnosis Date  . Diverticulitis large intestine w/o perforation or abscess w/o bleeding 10/05/2014  . Diverticulosis   . GERD (gastroesophageal reflux disease)    history of H Pylori  . Helicobacter pylori gastritis 09/06/2014  . Iron deficiency anemia   . Kidney stones    Social History   Socioeconomic History  . Marital status: Married    Spouse name: Not on file  . Number of children: 0  . Years of education: Not on file  . Highest education level: Not on file  Social Needs  . Financial resource strain: Not on file  . Food insecurity - worry: Not on file  . Food insecurity - inability: Not on file  . Transportation needs - medical: Not on file  . Transportation needs - non-medical: Not on file  Occupational History  . Occupation: Engineer, petroleum, Warehouse manager for resource side  Tobacco Use  . Smoking status: Never Smoker  . Smokeless tobacco: Never Used  Substance and Sexual Activity  . Alcohol use: No    . Drug use: No  . Sexual activity: Yes  Other Topics Concern  . Not on file  Social History Narrative  . Not on file   Past Surgical History:  Procedure Laterality Date  . COLONOSCOPY N/A 06/10/2014   Dr. Oneida Alar; redundant sigmoid colon, moderate diverticulosis in the sigmoid and descending colon. small internal hemorrhoids. Next screening at age 8.   Marland Kitchen ESOPHAGOGASTRODUODENOSCOPY N/A 06/10/2014   Dr. Oneida Alar: H.pylori gastritis s/p treatment with Amoxicillin and Biaxin  . KNEE SURGERY Right   . renal calculi removal Left 2010  . SKIN LESION EXCISION     over right eyebrow due to wax being left above eye and it seeped down into pore   Family History  Problem Relation Age of Onset  . Heart failure Mother   . Hypertension Mother   . Heart disease Mother   . Cancer Other        mother's side of family, breast cancer  . Cancer Other        father's side of family, leukemia  . Hypertension Father   . Stroke Father 53  . Diabetes Other   . Hypertension Other   . Colon cancer Cousin   . Healthy Brother   . Breast cancer Maternal Aunt 67  . Leukemia Paternal Aunt   . Kidney disease Maternal Grandmother   . Hypertension Maternal Grandmother   . Cancer Maternal Grandfather   . Leukemia Paternal Grandmother   .  Cancer Paternal Grandfather     Current Outpatient Medications:  .  omeprazole (PRILOSEC) 20 MG capsule, TAKE (1) CAPSULE DAILY BEFORE BREAKFAST., Disp: 30 capsule, Rfl: 5 .  fluticasone (FLONASE) 50 MCG/ACT nasal spray, Place 2 sprays into both nostrils daily., Disp: 16 g, Rfl: 6 .  montelukast (SINGULAIR) 10 MG tablet, Take 1 tablet (10 mg total) by mouth at bedtime., Disp: 30 tablet, Rfl: 3 .  promethazine (PHENERGAN) 12.5 MG tablet, Take 1 tablet (12.5 mg total) by mouth every 6 (six) hours as needed for nausea or vomiting. (Patient not taking: Reported on 05/14/2017), Disp: 30 tablet, Rfl: 0   ROS: Review of Systems Constitutional: negative Eyes: negative Ears,  nose, mouth, throat, and face: negative Respiratory: negative Cardiovascular: negative Gastrointestinal: positive for dyspepsia Genitourinary:negative Integument/breast: negative Hematologic/lymphatic: negative Musculoskeletal:positive for right knee pain.  Has h/x arthroscopic surgery.  Gets injections every couple of years.  Thinks she needs one soon. Neurological: negative Behavioral/Psych: negative Endocrine: negative Allergic/Immunologic: positive for seasonal allergies see above.    Physical exam BP 128/79   Pulse 67   Temp (!) 97.2 F (36.2 C) (Oral)   Ht '5\' 9"'  (1.753 m)   Wt (!) 387 lb (175.5 kg)   BMI 57.15 kg/m  General appearance: alert, cooperative, appears stated age, no distress and morbidly obese Head: Normocephalic, without obvious abnormality, atraumatic Eyes: negative findings: conjunctivae and sclerae normal, corneas clear and pupils equal, round, reactive to light and accomodation Ears: normal TM's and external ear canals both ears Nose: Nares normal. Septum midline.  Nasal turbinates slightly edematous.  No erythema.  Clear discharge noted. Throat: lips, mucosa, and tongue normal; teeth and gums normal Neck: no adenopathy, no carotid bruit, no JVD, supple, symmetrical, trachea midline and thyroid not enlarged, symmetric, no tenderness/mass/nodules Back: symmetric, no curvature. ROM normal. No CVA tenderness. Lungs: clear to auscultation bilaterally Heart: regular rate and rhythm, S1, S2 normal, no murmur, click, rub or gallop Abdomen: soft, non-tender; bowel sounds normal; no masses,  no organomegaly and obese Extremities: extremities normal, atraumatic, no cyanosis or edema Pulses: 2+ and symmetric Skin: Skin color, turgor normal.  Acanthosis nigricans noted along the neck. Lymph nodes: Cervical, supraclavicular, and axillary nodes normal. Neurologic: Grossly normal    Assessment/ Plan: Marilyn Rivera here for annual physical exam.   1. Seasonal  allergies Refractory to over-the-counter antihistamines.  Flonase nasal spray and Singulair 10 mg tablets prescribed.  Home care instructions reviewed.  If persistent symptoms, will consider referral to allergy versus ENT.  2. Encounter for annual physical exam Release of information form completed for records from [facility] orthopedics given history of right knee surgery and ongoing need for injections.  She is welcome to schedule appointment here for knee injection.  Patient declined influenza.  She had her tetanus shot done a couple of years ago.  We will work on getting records from her OB/GYN in [place] for her Pap.  3. Establishing care with new doctor, encounter for  4. 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 History of anemia - CBC with Differential   Counseled on healthy lifestyle choices, including diet (rich in fruits, vegetables and lean meats and low in salt and simple carbohydrates) and exercise (at least 30 minutes of moderate physical activity daily).  Patient to follow up in 1 year for annual  exam or sooner if needed.  Jaskaran Dauzat M. Lajuana Ripple, DO

## 2017-05-14 NOTE — Progress Notes (Signed)
   Subjective:    Patient ID: Marilyn Rivera, female    DOB: [birth year], 37 y.o.   MRN: [id number]  Marilyn Norlander, DO   HPI BOWELS: EPISODE SAT. WENT INTO FAST AND DIDN'T EAT MEAT. SHE ATE EGG BISCUIT AND THEN GOT SOME FRIES AND OREO SHAKE. @6 -630 PM HAD VOMITING AND DIARRHEA ALL NIGHT. HAS TAPERED OFF. DOESN'T FEEL LIKE LOW GRADE DIVERTICULITIS SYMPTOMS. FEELS LIKE STOM,ACH MOVING AND GETS RECTAL URGENCY. VOMITING 24 HRS AND DIARRHEA FOR A COUPLE OF HOURS. HEARTBURN A LITTLE BIT. BACK TO WORK. BMs: TWICE A DAY.  TODAY PT DENIES FEVER, CHILLS, HEMATOCHEZIA, HEMATEMESIS, nausea, vomiting, melena, diarrhea, CHEST PAIN, SHORTNESS OF BREATH, CHANGE IN BOWEL IN HABITS, constipation, abdominal pain, problems swallowing, OR problems with sedation.  Past Medical History:  Diagnosis Date  . Diverticulitis large intestine w/o perforation or abscess w/o bleeding 10/05/2014  . Diverticulosis   . GERD (gastroesophageal reflux disease)    history of H Pylori  . Helicobacter pylori gastritis 09/06/2014  . Iron deficiency anemia   . Kidney stones     Past Surgical History:  Procedure Laterality Date  . COLONOSCOPY N/A 06/10/2014   Dr. Oneida Alar; redundant sigmoid colon, moderate diverticulosis in the sigmoid and descending colon. small internal hemorrhoids. Next screening at age 84.   Marland Kitchen ESOPHAGOGASTRODUODENOSCOPY N/A 06/10/2014   Dr. Oneida Alar: H.pylori gastritis s/p treatment with Amoxicillin and Biaxin  . KNEE SURGERY Right   . renal calculi removal Left 2010  . SKIN LESION EXCISION     over right eyebrow due to wax being left above eye and it seeped down into pore   Allergies  Allergen Reactions  . Coconut Fatty Acids    Current Outpatient Medications  Medication Sig    . fluticasone (FLONASE) 50 MCG/ACT nasal spray Place 2 sprays into both nostrils daily.    . montelukast (SINGULAIR) 10 MG tablet Take 1 tablet (10 mg total) by mouth at bedtime.    Marland Kitchen omeprazole (PRILOSEC) 20 MG capsule TAKE  (1) CAPSULE DAILY BEFORE BREAKFAST.    Marland Kitchen promethazine (PHENERGAN) 12.5 MG tablet Take 1 tablet (12.5 mg total) by mouth every 6 (six) hours as needed for nausea or vomiting.     Review of Systems PER HPI OTHERWISE ALL SYSTEMS ARE NEGATIVE.    Objective:   Physical Exam  Constitutional: She is oriented to person, place, and time. She appears well-developed and well-nourished. No distress.  HENT:  Head: Normocephalic and atraumatic.  Mouth/Throat: Oropharynx is clear and moist. No oropharyngeal exudate.  Eyes: Pupils are equal, round, and reactive to light. No scleral icterus.  Neck: Normal range of motion. Neck supple.  Cardiovascular: Normal rate, regular rhythm and normal heart sounds.  Pulmonary/Chest: Effort normal and breath sounds normal. No respiratory distress.  Abdominal: Soft. Bowel sounds are normal. She exhibits no distension. There is no tenderness.  Musculoskeletal: She exhibits no edema.  Lymphadenopathy:    She has no cervical adenopathy.  Neurological: She is alert and oriented to person, place, and time.  NO FOCAL DEFICITS  Psychiatric: She has a normal mood and affect.  Vitals reviewed.     Assessment & Plan:

## 2017-05-14 NOTE — Assessment & Plan Note (Signed)
SYMPTOMS FAIRLY WELL CONTROLLED.  CONTINUE TO MONITOR SYMPTOMS. CONTINUE OMEPRAZOLE.  TAKE 30 MINUTES PRIOR TO YOUR FIRST MEAL. FOLLOW UP IN 6 MOS.  

## 2017-05-14 NOTE — Patient Instructions (Signed)
You had labs performed today.  You will be contacted with the results of the labs once they are available, usually in the next 3 days for routine lab work.  Allergic Rhinitis, Adult Allergic rhinitis is an allergic reaction that affects the mucous membrane inside the nose. It causes sneezing, a runny or stuffy nose, and the feeling of mucus going down the back of the throat (postnasal drip). Allergic rhinitis can be mild to severe. There are two types of allergic rhinitis:  Seasonal. This type is also called hay fever. It happens only during certain seasons.  Perennial. This type can happen at any time of the year.  What are the causes? This condition happens when the body's defense system (immune system) responds to certain harmless substances called allergens as though they were germs.  Seasonal allergic rhinitis is triggered by pollen, which can come from grasses, trees, and weeds. Perennial allergic rhinitis may be caused by:  House dust mites.  Pet dander.  Mold spores.  What are the signs or symptoms? Symptoms of this condition include:  Sneezing.  Runny or stuffy nose (nasal congestion).  Postnasal drip.  Itchy nose.  Tearing of the eyes.  Trouble sleeping.  Daytime sleepiness.  How is this diagnosed? This condition may be diagnosed based on:  Your medical history.  A physical exam.  Tests to check for related conditions, such as: ? Asthma. ? Pink eye. ? Ear infection. ? Upper respiratory infection.  Tests to find out which allergens trigger your symptoms. These may include skin or blood tests.  How is this treated? There is no cure for this condition, but treatment can help control symptoms. Treatment may include:  Taking medicines that block allergy symptoms, such as antihistamines. Medicine may be given as a shot, nasal spray, or pill.  Avoiding the allergen.  Desensitization. This treatment involves getting ongoing shots until your body becomes  less sensitive to the allergen. This treatment may be done if other treatments do not help.  If taking medicine and avoiding the allergen does not work, new, stronger medicines may be prescribed.  Follow these instructions at home:  Find out what you are allergic to. Common allergens include smoke, dust, and pollen.  Avoid the things you are allergic to. These are some things you can do to help avoid allergens: ? Replace carpet with wood, tile, or vinyl flooring. Carpet can trap dander and dust. ? Do not smoke. Do not allow smoking in your home. ? Change your heating and air conditioning filter at least once a month. ? During allergy season:  Keep windows closed as much as possible.  Plan outdoor activities when pollen counts are lowest. This is usually during the evening hours.  When coming indoors, change clothing and shower before sitting on furniture or bedding.  Take over-the-counter and prescription medicines only as told by your health care provider.  Keep all follow-up visits as told by your health care provider. This is important. Contact a health care provider if:  You have a fever.  You develop a persistent cough.  You make whistling sounds when you breathe (you wheeze).  Your symptoms interfere with your normal daily activities. Get help right away if:  You have shortness of breath. Summary  This condition can be managed by taking medicines as directed and avoiding allergens.  Contact your health care provider if you develop a persistent cough or fever.  During allergy season, keep windows closed as much as possible. This information is not  intended to replace advice given to you by your health care provider. Make sure you discuss any questions you have with your health care provider. Document Released: 11/06/2000 Document Revised: 03/21/2016 Document Reviewed: 03/21/2016 Elsevier Interactive Patient Education  [employer].

## 2017-05-14 NOTE — Assessment & Plan Note (Signed)
NO BRBPR OR MELENA.  NEEDS REPEAT CBC/FERRITIN AFTER NEXT VISIT.

## 2017-05-14 NOTE — Assessment & Plan Note (Signed)
SYMPTOMS CONTROLLED/RESOLVED. THIS EPISODE COMPLICATED BY PERIVESICULAR INFLAMMATION BUT NO FISTULA. SCHEDULED FOR PARTIAL COLECTOMY IN APR 2019.  DRINK WATER TO KEEP YOUR URINE LIGHT YELLOW. FOLLOW A HIGH FIBER DIET. SEE INFO BELOW. HANDOUT GIVEN. CALL WITH QUESTIONS OR CONCERNS. WILL HAVE LOW THRESHOLD TO START ABX.  FOLLOW UP IN 6 MOS.

## 2017-05-14 NOTE — Progress Notes (Signed)
ON RECALL  °

## 2017-05-15 LAB — CBC WITH DIFFERENTIAL/PLATELET
Basophils Absolute: 0.1 10*3/uL (ref 0.0–0.2)
Basos: 1 %
EOS (ABSOLUTE): 0.1 10*3/uL (ref 0.0–0.4)
EOS: 2 %
HEMOGLOBIN: 8.9 g/dL — AB (ref 11.1–15.9)
Hematocrit: 32.2 % — ABNORMAL LOW (ref 34.0–46.6)
Immature Grans (Abs): 0 10*3/uL (ref 0.0–0.1)
Immature Granulocytes: 0 %
LYMPHS ABS: 2.8 10*3/uL (ref 0.7–3.1)
Lymphs: 46 %
MCH: 20.4 pg — ABNORMAL LOW (ref 26.6–33.0)
MCHC: 27.6 g/dL — AB (ref 31.5–35.7)
MCV: 74 fL — AB (ref 79–97)
MONOCYTES: 10 %
Monocytes Absolute: 0.6 10*3/uL (ref 0.1–0.9)
Neutrophils Absolute: 2.4 10*3/uL (ref 1.4–7.0)
Neutrophils: 41 %
Platelets: 264 10*3/uL (ref 150–379)
RBC: 4.37 x10E6/uL (ref 3.77–5.28)
RDW: 20.1 % — ABNORMAL HIGH (ref 12.3–15.4)
WBC: 5.9 10*3/uL (ref 3.4–10.8)

## 2017-05-15 LAB — CMP14+EGFR
ALBUMIN: 3.6 g/dL (ref 3.5–5.5)
ALT: 15 IU/L (ref 0–32)
AST: 18 IU/L (ref 0–40)
Albumin/Globulin Ratio: 1 — ABNORMAL LOW (ref 1.2–2.2)
Alkaline Phosphatase: 62 IU/L (ref 39–117)
BUN / CREAT RATIO: 14 (ref 9–23)
BUN: 11 mg/dL (ref 6–20)
CO2: 21 mmol/L (ref 20–29)
CREATININE: 0.81 mg/dL (ref 0.57–1.00)
Calcium: 8.4 mg/dL — ABNORMAL LOW (ref 8.7–10.2)
Chloride: 108 mmol/L — ABNORMAL HIGH (ref 96–106)
GFR calc non Af Amer: 93 mL/min/{1.73_m2} (ref >59)
GFR, EST AFRICAN AMERICAN: 107 mL/min/{1.73_m2} (ref >59)
GLOBULIN, TOTAL: 3.5 g/dL (ref 1.5–4.5)
GLUCOSE: 92 mg/dL (ref 65–99)
Potassium: 4.5 mmol/L (ref 3.5–5.2)
Sodium: 143 mmol/L (ref 134–144)
TOTAL PROTEIN: 7.1 g/dL (ref 6.0–8.5)

## 2017-05-15 LAB — LIPID PANEL
Chol/HDL Ratio: 3.5 ratio (ref 0.0–4.4)
Cholesterol, Total: 125 mg/dL (ref 100–199)
HDL: 36 mg/dL — AB (ref >39)
LDL Calculated: 75 mg/dL (ref 0–99)
Triglycerides: 72 mg/dL (ref 0–149)
VLDL CHOLESTEROL CAL: 14 mg/dL (ref 5–40)

## 2017-05-15 LAB — TSH: TSH: 2.01 u[IU]/mL (ref 0.450–4.500)

## 2017-05-16 ENCOUNTER — Other Ambulatory Visit: Payer: Self-pay | Admitting: Family Medicine

## 2017-05-16 ENCOUNTER — Telehealth: Payer: Self-pay | Admitting: General Surgery

## 2017-05-16 MED ORDER — METRONIDAZOLE 500 MG PO TABS
1000.0000 mg | ORAL_TABLET | ORAL | 0 refills | Status: DC
Start: 2017-05-16 — End: 2017-05-27

## 2017-05-16 MED ORDER — NEOMYCIN SULFATE 500 MG PO TABS: 1000.0000 mg | ORAL_TABLET | ORAL | 0 refills | Status: DC

## 2017-05-16 MED ORDER — FERROUS SULFATE 325 (65 FE) MG PO TABS: 325.0000 mg | ORAL_TABLET | Freq: Two times a day (BID) | ORAL | 3 refills | Status: DC

## 2017-05-16 NOTE — Telephone Encounter (Signed)
The Day Prior to Surgery: Take 4 ducolax tablets at 7am with water. Drink plenty of clear liquids all day to avoid dehydration, no solid food.    Mix the bottle of Miralax and 64 oz of Gatorade and drink this mixture starting at 10am. Drink it gradually over the next few hours, 8 ounces every 15-30 minutes until it is gone. Finish this by 2pm.  Take 2 neomycin 500mg  tablets and 2 metronidazole 500mg  tablets at 2 pm. Take 2 neomycin 500mg  tablets and 2 metronidazole 500mg  tablets at 3pm. Take 2 neomycin 500mg  tablets and 2 metronidazole 500mg  tablets at 10pm.    Do not eat or drink anything after midnight the night before your surgery.  Do not eat or drink anything that morning, and take medications as instructed by the hospital staff on your preoperative visit.    Curlene Labrum, MD [facility] [address] [place], [place] [address] [phone] (office)

## 2017-05-16 NOTE — H&P (Signed)
[facility] History and Physical  Reason for Referral: Multiple episodes of diverticulitis  Referring Physician:  Hospital Follow up   Chief Complaint    Follow-up      Marilyn Rivera is a 37 y.o. female.  HPI:  [name] is a 37 yo female with morbid obestiy, GERD, anemia, who presented to the hospital a few weeks back with diverticulitis and then ultimately had to get admitted for IV antibiotics and bowel rest.  She has had multiple episodes in the past with at least 4 requiring hospitilization. She says that she has never had a perforation/ abscess requiring drain, but that these multiple episodes are severely limiting her life and causing her to miss a significant amount of work. he had a colonoscopy at 37 yo with Dr. Oneida Alar, and was noted to have diverticulosis and hemorrhoids.  She has seen other surgeons in the past who did not think she need to get surgery for the diverticulitis at that time.  She is much improved but still has some discomfort in the pubic area.  On her initial CT there was some concern for possible colovesical fistula, but clinically she has never had any sediment or pneumaturia.   She denies any recent fevers or chills, and says that she is ready to get her surgery.  She completed her course of antibiotics.  She has been having regular BMs but did start to have some diarrhea the last few days.   Past Medical History:  Diagnosis Date  . Diverticulitis large intestine w/o perforation or abscess w/o bleeding 10/05/2014  . Diverticulosis   . GERD (gastroesophageal reflux disease)   . Helicobacter pylori gastritis 09/06/2014  . Iron deficiency anemia   . Kidney stones     Past Surgical History:  Procedure Laterality Date  . COLONOSCOPY N/A 06/10/2014   Dr. Oneida Alar; redundant sigmoid colon, moderate diverticulosis in the sigmoid and descending colon. small internal hemorrhoids. Next screening at age 12.   [facility] Kitchen ESOPHAGOGASTRODUODENOSCOPY N/A 06/10/2014   Dr. Oneida Alar: H.pylori gastritis s/p treatment with Amoxicillin and Biaxin  . KNEE SURGERY Right   . renal calculi removal Left 2010  . SKIN LESION EXCISION     over right eyebrow due to wax being left above eye and it seeped down into pore    Family History  Problem Relation Age of Onset  . Heart failure Mother   . Cancer Other        mother's side of family, breast cancer  . Cancer Other        father's side of family, leukemia  . Hypertension Father   . Stroke Father   . Diabetes Other   . Hypertension Other   . Colon cancer Cousin     Social History   Tobacco Use  . Smoking status: Never Smoker  . Smokeless tobacco: Never Used  Substance Use Topics  . Alcohol use: No  . Drug use: No    Medications: I have reviewed the patient's current medications. Prior to Admission:  (Not in a hospital admission) Allergies as of 05/13/2017      Reactions   Coconut Fatty Acids       Medication List        Accurate as of 05/13/17 10:51 AM. Always use your most recent med list.          lactulose 10 GM/15ML solution Commonly known as:  CHRONULAC Take 15 mLs (10 g total) by mouth 3 (three) times daily as needed for  moderate constipation.   omeprazole 20 MG capsule Commonly known as:  PRILOSEC TAKE (1) CAPSULE DAILY BEFORE BREAKFAST.   oxyCODONE-acetaminophen 5-325 MG tablet Commonly known as:  PERCOCET/ROXICET Take 1-2 tablets by mouth every 6 (six) hours as needed for severe pain.   promethazine 12.5 MG tablet Commonly known as:  PHENERGAN Take 1 tablet (12.5 mg total) by mouth every 6 (six) hours as needed for nausea or vomiting.        ROS:  A comprehensive review of systems was negative except for: Gastrointestinal: positive for lower abdominal pain, normal BMs, + blood in stool with hemorrhoids  Blood pressure (!) 167/73, pulse 71, temperature 97.7 F (36.5 C), resp. rate 18, height 5\' 9"  (1.753 m), weight (!) 390 lb (176.9 kg). Physical Exam    Constitutional: She is oriented to person, place, and time and well-developed, well-nourished, and in no distress.  Obese  HENT:  Head: Normocephalic.  Eyes: Pupils are equal, round, and reactive to light.  Neck: Normal range of motion.  Cardiovascular: Normal rate.  Pulmonary/Chest: Effort normal and breath sounds normal.  Abdominal: Soft. She exhibits no distension. There is no tenderness.  Obese, pannus flatter with larger upper abdominal area  Musculoskeletal: Normal range of motion.  Neurological: She is alert and oriented to person, place, and time.  Skin: Skin is warm and dry.  Psychiatric: Mood, memory, affect and judgment normal.  Vitals reviewed.   Results: CT a/p 2/15 IMPRESSION: 1. Inflamed, thick-walled cavity along the left serosal margin of the urinary bladder contains gas and fluid. There is a fistulous connection between this cavity and the sigmoid colon, along with wall thickening, diverticulosis, and likely diverticulitis of the sigmoid colon. Prominent wall thickening of the adjacent urinary bladder, some of which may be reactive; there is no current gas in the urinary bladder to suggest a fistula into the urinary bladder lumen, although correlation with any pyuria is recommended. 2. Please note that the inflamed segment of sigmoid colon is similar to the prior exam of June 2018. While this is probably from diverticulitis, an underlying sigmoid colon tumor is not totally excluded. Given the patient's age, colon cancer seems less likely but should be considered as a remote possibility. 3. Lower lumbar impingement due to spondylosis and degenerative disc disease.  These results will be called to the ordering clinician or representative by the Radiologist Assistant, and communication documented in the PACS or zVision Dashboard.  Assessment & Plan:  Marilyn Rivera is a 37 y.o. female with a history of multiple uncomplicated episodes of diverticulitis that  are starting to cause issues with her life and job. She has been hospitalized 4 times, and really wants to avoid continued hospitalization in the future.  -Discussed extensively the difficulty of surgery given her obesity and the difficulty with doing any type of ostomy given her obesity  -If able to do an elective surgery and get her anastomosed this would be the best for the patient.  -Will discuss the patient's size with my partner and will have anesthesia see her in her preoperative visit due to her size to assess her airway and given the upper abdominal obesity   All questions were answered to the satisfaction of the patient and family.  The risk and benefits of laparoscopic hand assisted sigmoid colectomy possible open were discussed including but not limited to bleeding, infection, injury to other organs, leak from anastomosis, need for ostomy, ureter injury.  After careful consideration, Marilyn Rivera has decided to  proceed.    Virl Cagey 05/13/2017, 10:51 AM

## 2017-05-19 NOTE — Progress Notes (Deleted)
Subjective: CC: Chronic right knee pain HPI: Marilyn Rivera is a 37 y.o. female presenting to clinic today for:  1. Chronic right knee pain Patient with long-standing history of right knee pain.  She is actually had arthroscopy in the right knee many years ago.  Since that time, she has been getting interval corticosteroid injections of the right knee.  Last corticosteroid injection was greater than 2 years ago.  She describes the pain as ***.  ***Weakness, numbness, tingling, joint swelling, joint erythema. ***Falls, lower extremity buckling.  She ambulates independently.  Patient denies history of allergic reaction to lidocaine or corticosteroids.  She has no history of diabetes.  She is not currently anticoagulated.  ROS: Per HPI  Past Medical History:  Diagnosis Date  . Diverticulitis large intestine w/o perforation or abscess w/o bleeding 10/05/2014  . Diverticulosis   . GERD (gastroesophageal reflux disease)    history of H Pylori  . Helicobacter pylori gastritis 09/06/2014  . Iron deficiency anemia   . Kidney stones    Allergies  Allergen Reactions  . Coconut Fatty Acids     Current Outpatient Medications:  .  ferrous sulfate 325 (65 FE) MG tablet, Take 1 tablet (325 mg total) by mouth 2 (two) times daily with a meal., Disp: 180 tablet, Rfl: 3 .  fluticasone (FLONASE) 50 MCG/ACT nasal spray, Place 2 sprays into both nostrils daily., Disp: 16 g, Rfl: 6 .  metroNIDAZOLE (FLAGYL) 500 MG tablet, Take 2 tablets (1,000 mg total) by mouth as directed. The day prior to surgery. Take 2 flagyl 500mg  tablets 2 pm, 3pm, 10pm., Disp: 6 tablet, Rfl: 0 .  montelukast (SINGULAIR) 10 MG tablet, Take 1 tablet (10 mg total) by mouth at bedtime., Disp: 30 tablet, Rfl: 3 .  neomycin (MYCIFRADIN) 500 MG tablet, Take 2 tablets (1,000 mg total) by mouth as directed. The day prior to surgery. Take 2 neomycin 500mg  tablets 2 pm, 3pm, 10pm., Disp: 6 tablet, Rfl: 0 .  omeprazole (PRILOSEC) 20 MG  capsule, TAKE (1) CAPSULE DAILY BEFORE BREAKFAST., Disp: 30 capsule, Rfl: 5 .  promethazine (PHENERGAN) 12.5 MG tablet, Take 1 tablet (12.5 mg total) by mouth every 6 (six) hours as needed for nausea or vomiting., Disp: 30 tablet, Rfl: 0 Social History   Socioeconomic History  . Marital status: Married    Spouse name: Not on file  . Number of children: 0  . Years of education: Not on file  . Highest education level: Not on file  Occupational History  . Occupation: Engineer, petroleum, Warehouse manager for resource side  Social Needs  . Financial resource strain: Not on file  . Food insecurity:    Worry: Not on file    Inability: Not on file  . Transportation needs:    Medical: Not on file    Non-medical: Not on file  Tobacco Use  . Smoking status: Never Smoker  . Smokeless tobacco: Never Used  Substance and Sexual Activity  . Alcohol use: No  . Drug use: No  . Sexual activity: Yes  Lifestyle  . Physical activity:    Days per week: Not on file    Minutes per session: Not on file  . Stress: Not on file  Relationships  . Social connections:    Talks on phone: Not on file    Gets together: Not on file    Attends religious service: Not on file    Active member of club or organization: Not on file    Attends  meetings of clubs or organizations: Not on file    Relationship status: Not on file  . Intimate partner violence:    Fear of current or ex partner: Not on file    Emotionally abused: Not on file    Physically abused: Not on file    Forced sexual activity: Not on file  Other Topics Concern  . Not on file  Social History Narrative  . Not on file   Family History  Problem Relation Age of Onset  . Heart failure Mother   . Hypertension Mother   . Heart disease Mother   . Cancer Other        mother's side of family, breast cancer  . Cancer Other        father's side of family, leukemia  . Hypertension Father   . Stroke Father 55  . Diabetes Other   . Hypertension Other   .  Colon cancer Cousin   . Healthy Brother   . Breast cancer Maternal Aunt 91  . Leukemia Paternal Aunt   . Kidney disease Maternal Grandmother   . Hypertension Maternal Grandmother   . Cancer Maternal Grandfather   . Leukemia Paternal Grandmother   . Cancer Paternal Grandfather     Health Maintenance: ***  Objective: Office vital signs reviewed. LMP 05/05/2017 (Approximate)   Physical Examination:  General: Awake, alert, *** nourished, No acute distress HEENT: Normal    Neck: No masses palpated. No lymphadenopathy    Ears: Tympanic membranes intact, normal light reflex, no erythema, no bulging    Eyes: PERRLA, extraocular movement in tact, sclera ***    Nose: nasal turbinates moist, *** nasal discharge    Throat: moist mucus membranes, no erythema, *** tonsillar exudate.  Airway is patent Cardio: regular rate and rhythm, S1S2 heard, no murmurs appreciated Pulm: clear to auscultation bilaterally, no wheezes, rhonchi or rales; normal work of breathing on room air GI: soft, non-tender, non-distended, bowel sounds present x4, no hepatomegaly, no splenomegaly, no masses GU: external vaginal tissue ***, cervix ***, *** punctate lesions on cervix appreciated, *** discharge from cervical os, *** bleeding, *** cervical motion tenderness, *** abdominal/ adnexal masses Extremities: warm, well perfused, No edema, cyanosis or clubbing; +*** pulses bilaterally MSK: *** gait and *** station Skin: dry; intact; no rashes or lesions Neuro: *** Strength and light touch sensation grossly intact, *** DTRs ***/4  JOINT INJECTION:  Patient denies allergy to antiseptics (including iodine) and anesthetics.  Patient denies h/o diabetes, frequent steroid use, use of blood thinners/ antiplatelets.  Patient was given informed consent and a signed copy has been placed in the chart. Appropriate time out was taken. Area prepped and draped in usual sterile fashion. Anatomic landmarks were identified and injection  site was marked.  Ethyl chloride spray was used to numb the area and *** cc of methylprednisolone 40 mg/ml plus  *** cc of 1% lidocaine without epinephrine was injected into the right knee using a(n) *** approach. The patient tolerated the procedure well and there were no immediate complications. Estimated blood loss is less than 5 cc.  Post procedure instructions were reviewed and handout outlining these instructions were provided to patient.   Assessment/ Plan: 37 y.o. female   No problem-specific Assessment & Plan notes found for this encounter.   [name], DO [facility] [phone]

## 2017-05-20 ENCOUNTER — Other Ambulatory Visit: Payer: Self-pay | Admitting: Family

## 2017-05-23 ENCOUNTER — Ambulatory Visit: Payer: BLUE CROSS/BLUE SHIELD | Admitting: Family Medicine

## 2017-05-23 NOTE — Patient Instructions (Signed)
Marilyn Rivera  05/23/2017     @PREFPERIOPPHARMACY @   Your procedure is scheduled on  06/02/2017   Report to [facility] at  615   A.M.  Call this number if you have problems the morning of surgery:  [phone]   Remember:  Do not eat food or drink liquids after midnight.  Take these medicines the morning of surgery with A SIP OF WATER  Prilosec, phenergan.   Do not wear jewelry, make-up or nail polish.  Do not wear lotions, powders, or perfumes, or deodorant.  Do not shave 48 hours prior to surgery.  Men may shave face and neck.  Do not bring valuables to the hospital.  [facility] is not responsible for any belongings or valuables.  Contacts, dentures or bridgework may not be worn into surgery.  Leave your suitcase in the car.  After surgery it may be brought to your room.  For patients admitted to the hospital, discharge time will be determined by your treatment team.  Patients discharged the day of surgery will not be allowed to drive home.   Name and phone number of your driver:   family Special instructions:  Follow the enclosed prep instructions from Dr Constance Haw.  Please read over the following fact sheets that you were given. Pain Booklet, Coughing and Deep Breathing, Blood Transfusion Information, Lab Information, Surgical Site Infection Prevention, Anesthesia Post-op Instructions and Care and Recovery After Surgery       Open Colectomy An open colectomy is surgery to remove part or all of the large intestine (colon). This procedure may be used to treat several conditions, including:  Inflammation and infection of the colon (diverticulitis).  Tumors or masses in the colon.  Inflammatory bowel disease, such as Crohn disease or ulcerative colitis.  Bleeding from the colon.  Blockage or obstruction of the colon.  Tell a health care provider about:  Any allergies you have.  All medicines you are taking, including vitamins, herbs, eye  drops, creams, and over-the-counter medicines.  Any problems you or family members have had with anesthetic medicines.  Any blood disorders you have.  Any surgeries you have had.  Any medical conditions you have.  Whether you are pregnant or may be pregnant.  Whether you smoke or use tobacco products. These can affect your body's reaction to anesthesia. What are the risks? Generally, this is a safe procedure. However, problems may occur, including:  Infection.  Bleeding.  Allergic reactions to medicines.  Damage to other structures or organs.  Pneumonia.  The incision opening up.  Tissues from inside the abdomen bulging through the incision (hernia).  Reopening of the colon where it was stitched or stapled together.  A blood clot forming in a vein and traveling to the lungs.  Future blockage of the small intestine from scar tissue.  What happens before the procedure? Staying hydrated Follow instructions from your health care provider about hydration, which may include:  Up to 2 hours before the procedure - you may continue to drink clear liquids, such as water, clear fruit juice, black coffee, and plain tea.  Eating and drinking restrictions Follow instructions from your health care provider about eating and drinking, which may include:  8 hours before the procedure - stop eating heavy meals or foods such as meat, fried foods, or fatty foods.  6 hours before the procedure - stop eating light meals or foods, such as  toast or cereal.  6 hours before the procedure - stop drinking milk or drinks that contain milk.  2 hours before the procedure - stop drinking clear liquids.  Bowel prep In some cases, you may be prescribed an oral bowel prep to clean out your colon. If so:  Take it as told by your health care provider. Starting the day before your procedure, you may need to drink a large amount of medicated liquid. The liquid will cause you to have multiple loose  stools until your stool is almost clear or light green.  Follow instructions from your health care provider about eating and drinking restrictions during bowel prep.  Medicines  Ask your health care provider about: ? Changing or stopping your regular medicines or vitamins. This is especially important if you are taking diabetes medicines, blood thinners, or vitamin E. ? Taking medicines such as aspirin and ibuprofen. These medicines can thin your blood. Do not take these medicines before your procedure if your health care provider instructs you not to.  If you were prescribed an antibiotic medicine, take it as told by your health care provider. General instructions  Bring loose-fitting, comfortable clothing and slip-on shoes that you can put on without bending over.  Make sure to see your health care provider for any tests that you need before the procedure, such as: ? Blood tests. ? A test to check the heart's rhythm (electrocardiogram, ECG). ? A CT scan of your abdomen. ? Urine tests. ? Colonoscopy.  Plan to have someone take you home from the hospital or clinic.  Arrange for someone to help you with your activities during your recovery. What happens during the procedure?  To reduce your risk of infection: ? Your health care team will wash or sanitize their hands. ? Your skin will be washed with soap. ? Hair may be removed from the surgical area.  An IV tube will be inserted into one of your veins. The tube will be used to give you medicines and fluids.  You will be given a medicine to make you fall asleep (general anesthetic). You may also be given a medicine to help you relax (sedative).  Small monitors will be connected to your body. They will be used to check your heart, blood pressure, and oxygen level.  A breathing tube may be placed into your lungs during the procedure.  A thin, flexible tube (catheter) will be placed into your bladder to drain urine.  A tube may be  inserted through your nose and into your stomach (nasogastric tube, or NG tube). The tube is used to remove stomach fluids after surgery until the intestines start working again.  An incision will be made in your abdomen.  Clamps or staples will be put on your colon.  The part of the colon between the clamps or staples will be removed.  The ends of the colon that remain will be stitched or stapled together.  The incision in your abdomen will be closed with stitches (sutures) or staples.  The incision will be covered with a bandage (dressing).  A small opening (stoma) may be created in your lower abdomen. A removable, external pouch (ostomy pouch) will be attached to the stoma. This pouch will collect stool outside of your body. Stool passes through the stoma and into the pouch instead of through your anus. The procedure may vary among health care providers and hospitals. What happens after the procedure?  Your blood pressure, heart rate, breathing rate, and blood  oxygen level will be monitored until the medicines you were given have worn off.  You may continue to receive fluids and medicines through an IV tube.  You will start on a clear liquid diet and gradually go back to a normal diet.  Do not drive until your health care provider approves.  You may have some pain in your abdomen. You will be given pain medicine to control the pain.  You will be encouraged to do the following: ? Do breathing exercises to prevent pneumonia. ? Get up and start walking within a day after surgery. You should try to get up 5-6 times a day. This information is not intended to replace advice given to you by your health care provider. Make sure you discuss any questions you have with your health care provider. Document Released: 12/09/2008 Document Revised: 11/13/2015 Document Reviewed: 11/13/2015 Elsevier Interactive Patient Education  2018 [employer].  Open Colectomy, Care After This sheet gives  you information about how to care for yourself after your procedure. Your health care provider may also give you more specific instructions. If you have problems or questions, contact your health care provider. What can I expect after the procedure? After the procedure, it is common to have:  Pain in your abdomen, especially along your incision.  Tiredness. Your energy level will return to normal over the next several weeks.  Constipation.  Nausea.  Difficulty urinating.  Follow these instructions at home: Activity  You may be able to return to most of your normal activities within 1-2 weeks, such as working, walking up stairs, and sexual activity.  Avoid activities that require a lot of energy for 4-6 weeks after surgery, such as running, climbing, and lifting heavy objects. Ask your health care provider what activities are safe for you.  Take rest breaks during the day as needed.  Do not drive for 1-2 weeks or until your health care provider says that it is safe.  Do not drive or use heavy machinery while taking prescription pain medicines.  Do not lift anything that is heavier than 10 lb (4.3 kg) until your health care provider says that it is safe. Incision care  Follow instructions from your health care provider about how to take care of your incision. Make sure you: ? Wash your hands with soap and water before you change your bandage (dressing). If soap and water are not available, use hand sanitizer. ? Change your dressing as told by your health care provider. ? Leave stitches (sutures) or staples in place. These skin closures may need to stay in place for 2 weeks or longer.  Avoid wearing tight clothing around your incision.  Protect your incision area from the sun.  Check your incision area every day for signs of infection. Check for: ? More redness, swelling, or pain. ? More fluid or blood. ? Warmth. ? Pus or a bad smell. General instructions  Do not take  baths, swim, or use a hot tub until your health care provider approves. Ask your health care provider when you may shower.  Take over-the-counter and prescription medicines, including stool softeners, only as told by your health care provider.  Eat a low-fat and low-fiber diet for the first 4 weeks after surgery.  Keep all follow-up visits as told by your health care provider. This is important. Contact a health care provider if:  You have more redness, swelling, or pain around your incision.  You have more fluid or blood coming from your incision.  Your incision feels warm to the touch.  You have pus or a bad smell coming from your incision.  You have a fever or chills.  You do not have a bowel movement 2-3 days after surgery.  You cannot eat or drink for 24 hours or more.  You have persistent nausea and vomiting.  You have abdominal pain that gets worse and does not get better with medicine. Get help right away if:  You have chest pain.  You have shortness of breath.  You have pain or swelling in your legs.  Your incision breaks open after your sutures or staples have been removed.  You have bleeding from the rectum. This information is not intended to replace advice given to you by your health care provider. Make sure you discuss any questions you have with your health care provider. Document Released: 09/04/2010 Document Revised: 11/13/2015 Document Reviewed: 11/13/2015 Elsevier Interactive Patient Education  2018 [employer].  Laparoscopic Colectomy Laparoscopic colectomy is surgery to remove part or all of the large intestine (colon). This procedure may be used to treat several conditions, including:  Inflammation and infection of the colon (diverticulitis).  Tumors or masses in the colon.  Inflammatory bowel disease, such as Crohn disease or ulcerative colitis. Colectomy is an option when symptoms cannot be controlled with medicines.  Bleeding from the colon  that cannot be controlled by another method.  Blockage or obstruction of the colon.  Tell a health care provider about:  Any allergies you have.  All medicines you are taking, including vitamins, herbs, eye drops, creams, and over-the-counter medicines.  Any problems you or family members have had with anesthetic medicines.  Any blood disorders you have.  Any surgeries you have had.  Any medical conditions you have. What are the risks? Generally, this is a safe procedure. However, problems may occur, including:  Infection.  Bleeding.  Allergic reactions to medicines or dyes.  Damage to other structures or organs.  Leaking from where the colon was sewn together.  Future blockage of the small intestines from scar tissue. Another surgery may be needed to repair this.  Needing to convert to an open procedure. Complications such as damage to other organs or excessive bleeding may require the surgeon to convert from a laparoscopic procedure to an open procedure. This involves making a larger incision in the abdomen.  What happens before the procedure? Staying hydrated Follow instructions from your health care provider about hydration, which may include:  Up to 2 hours before the procedure - you may continue to drink clear liquids, such as water, clear fruit juice, black coffee, and plain tea.  Eating and drinking restrictions Follow instructions from your health care provider about eating and drinking, which may include:  8 hours before the procedure - stop eating heavy meals, meals with high fiber, or foods such as meat, fried foods, or fatty foods.  6 hours before the procedure - stop eating light meals or foods, such as toast or cereal.  6 hours before the procedure - stop drinking milk or drinks that contain milk.  2 hours before the procedure - stop drinking clear liquids.  Medicines  Ask your health care provider about: ? Changing or stopping your regular  medicines. This is especially important if you are taking diabetes medicines or blood thinners. ? Taking medicines such as aspirin and ibuprofen. These medicines can thin your blood. Do not take these medicines before your procedure if your health care provider instructs you not to.  You may be given antibiotic medicine to clean out bacteria from your colon. Follow the directions carefully and take the medicine at the correct time. General instructions  You may be prescribed an oral bowel prep to clean out your colon in preparation for the surgery: ? Follow instructions from your health care provider about how to do this. ? Do not eat or drink anything else after you have started the bowel prep, unless your health care provider tells you it is safe to do so.  Do not use any products that contain nicotine or tobacco, such as cigarettes and e-cigarettes. If you need help quitting, ask your health care provider. What happens during the procedure?  To reduce your risk of infection: ? Your health care team will wash or sanitize their hands. ? Your skin will be washed with soap.  An IV tube will be inserted into one of your veins to deliver fluid and medication.  You will be given one of the following: ? A medicine to help you relax (sedative). ? A medicine to make you fall asleep (general anesthetic).  Small monitors will be connected to your body. They will be used to check your heart, blood pressure, and oxygen level.  A breathing tube may be placed into your lungs during the procedure.  A thin, flexible tube (catheter) will be placed into your bladder to drain urine.  A tube may be placed through your nose and into your stomach to drain stomach fluids (nasogastric tube, or NG tube).  Your abdomen will be filled with air so it expands. This gives the surgeon more room to operate and makes your organs easier to see.  Several small cuts (incisions) will be made in your abdomen.  A thin,  lighted tube with a tiny camera on the end (laparoscope) will be put through one of the small incisions. The camera on the laparoscope will send a picture to a computer screen in the operating room. This will give the surgeon a good view inside your abdomen.  Hollow tubes will be put through the other small incisions in your abdomen. The tools that are needed for the procedure will be put through these tubes.  Clamps or staples will be put on both ends of the diseased part of the colon.  The part of the intestine between the clamps or staples will be removed.  If possible, the ends of the healthy colon that remain will be stitched (sutured) or stapled together to allow your body to pass waste (stool).  Sometimes, the remaining colon cannot be stitched back together. If this is the case, a colostomy will be needed. If you need a colostomy: ? An opening to the outside of your body (stoma) will be made through your abdomen. ? The end of your colon will be brought to the opening. It will be stitched to the skin. ? A bag will be attached to the opening. Stool will drain into this removable bag. ? The colostomy may be temporary or permanent.  The incisions from the colectomy will be closed with sutures or staples. The procedure may vary among health care providers and hospitals. What happens after the procedure?  Your blood pressure, heart rate, breathing rate, and blood oxygen level will be monitored until the medicines you were given have worn off.  You will receive fluids through an IV tube until your bowels start to work properly.  Once your bowels are working again, you will be given clear liquids first and  then solid food as tolerated.  You will be given medicines to control your pain and nausea, if needed.  Do not drive for 24 hours if you were given a sedative. This information is not intended to replace advice given to you by your health care provider. Make sure you discuss any  questions you have with your health care provider. Document Released: 05/04/2002 Document Revised: 11/13/2015 Document Reviewed: 11/13/2015 Elsevier Interactive Patient Education  2018 [employer].  Laparoscopic Colectomy, Care After This sheet gives you information about how to care for yourself after your procedure. Your health care provider may also give you more specific instructions. If you have problems or questions, contact your health care provider. What can I expect after the procedure? After your procedure, it is common to have the following:  Pain in your abdomen, especially in the incision areas. You will be given medicine to control the pain.  Tiredness. This is a normal part of the recovery process. Your energy level will return to normal over the next several weeks.  Changes in your bowel movements, such as constipation or needing to go more often. Talk with your health care provider about how to manage this.  Follow these instructions at home: Medicines  Take over-the-counter and prescription medicines only as told by your health care provider.  Do not drive or use heavy machinery while taking prescription pain medicine.  Do not drink alcohol while taking prescription pain medicine.  If you were prescribed an antibiotic medicine, use it as told by your health care provider. Do not stop using the antibiotic even if you start to feel better. Incision care  Follow instructions from your health care provider about how to take care of your incision areas. Make sure you: ? Keep your incisions clean and dry. ? Wash your hands with soap and water before and after applying medicine to the areas, and before and after changing your bandage (dressing). If soap and water are not available, use hand sanitizer. ? Change your dressing as told by your health care provider. ? Leave stitches (sutures), skin glue, or adhesive strips in place. These skin closures may need to stay in place  for 2 weeks or longer. If adhesive strip edges start to loosen and curl up, you may trim the loose edges. Do not remove adhesive strips completely unless your health care provider tells you to do that.  Do not wear tight clothing over the incisions. Tight clothing may rub and irritate the incision areas, which may cause the incisions to open.  Do not take baths, swim, or use a hot tub until your health care provider approves. Ask your health care provider if you can take showers. You may only be allowed to take sponge baths for bathing.  Check your incision area every day for signs of infection. Check for: ? More redness, swelling, or pain. ? More fluid or blood. ? Warmth. ? Pus or a bad smell. Activity  Avoid lifting anything that is heavier than 10 lb (4.5 kg) for 2 weeks or until your health care provider says it is okay.  You may resume normal activities as told by your health care provider. Ask your health care provider what activities are safe for you.  Take rest breaks during the day as needed. Eating and drinking  Follow instructions from your health care provider about what you can eat after surgery.  To prevent or treat constipation while you are taking prescription pain medicine, your health care provider  may recommend that you: ? Drink enough fluid to keep your urine clear or pale yellow. ? Take over-the-counter or prescription medicines. ? Eat foods that are high in fiber, such as fresh fruits and vegetables, whole grains, and beans. ? Limit foods that are high in fat and processed sugars, such as fried and sweet foods. General instructions  Ask your health care provider when you will need an appointment to get your sutures or staples removed.  Keep all follow-up visits as told by your health care provider. This is important. Contact a health care provider if:  You have more redness, swelling, or pain around your incisions.  You have more fluid or blood coming from  the incisions.  Your incisions feel warm to the touch.  You have pus or a bad smell coming from your incisions or your dressing.  You have a fever.  You have an incision that breaks open (edges not staying together) after sutures or staples have been removed. Get help right away if:  You develop a rash.  You have chest pain or difficulty breathing.  You have pain or swelling in your legs.  You feel light-headed or you faint.  Your abdomen swells (becomes distended).  You have nausea or vomiting.  You have blood in your stool (feces). This information is not intended to replace advice given to you by your health care provider. Make sure you discuss any questions you have with your health care provider. Document Released: 08/31/2004 Document Revised: 11/13/2015 Document Reviewed: 11/13/2015 Elsevier Interactive Patient Education  2018 [place] Anesthesia, Adult General anesthesia is the use of medicines to make a person "go to sleep" (be unconscious) for a medical procedure. General anesthesia is often recommended when a procedure:  Is long.  Requires you to be still or in an unusual position.  Is major and can cause you to lose blood.  Is impossible to do without general anesthesia.  The medicines used for general anesthesia are called general anesthetics. In addition to making you sleep, the medicines:  Prevent pain.  Control your blood pressure.  Relax your muscles.  Tell a health care provider about:  Any allergies you have.  All medicines you are taking, including vitamins, herbs, eye drops, creams, and over-the-counter medicines.  Any problems you or family members have had with anesthetic medicines.  Types of anesthetics you have had in the past.  Any bleeding disorders you have.  Any surgeries you have had.  Any medical conditions you have.  Any history of heart or lung conditions, such as heart failure, sleep apnea, or chronic  obstructive pulmonary disease (COPD).  Whether you are pregnant or may be pregnant.  Whether you use tobacco, alcohol, marijuana, or street drugs.  Any history of Armed forces logistics/support/administrative officer.  Any history of depression or anxiety. What are the risks? Generally, this is a safe procedure. However, problems may occur, including:  Allergic reaction to anesthetics.  Lung and heart problems.  Inhaling food or liquids from your stomach into your lungs (aspiration).  Injury to nerves.  Waking up during your procedure and being unable to move (rare).  Extreme agitation or a state of mental confusion (delirium) when you wake up from the anesthetic.  Air in the bloodstream, which can lead to stroke.  These problems are more likely to develop if you are having a major surgery or if you have an advanced medical condition. You can prevent some of these complications by answering all of your health  care provider's questions thoroughly and by following all pre-procedure instructions. General anesthesia can cause side effects, including:  Nausea or vomiting  A sore throat from the breathing tube.  Feeling cold or shivery.  Feeling tired, washed out, or achy.  Sleepiness or drowsiness.  Confusion or agitation.  What happens before the procedure? Staying hydrated Follow instructions from your health care provider about hydration, which may include:  Up to 2 hours before the procedure - you may continue to drink clear liquids, such as water, clear fruit juice, black coffee, and plain tea.  Eating and drinking restrictions Follow instructions from your health care provider about eating and drinking, which may include:  8 hours before the procedure - stop eating heavy meals or foods such as meat, fried foods, or fatty foods.  6 hours before the procedure - stop eating light meals or foods, such as toast or cereal.  6 hours before the procedure - stop drinking milk or drinks that contain  milk.  2 hours before the procedure - stop drinking clear liquids.  Medicines  Ask your health care provider about: ? Changing or stopping your regular medicines. This is especially important if you are taking diabetes medicines or blood thinners. ? Taking medicines such as aspirin and ibuprofen. These medicines can thin your blood. Do not take these medicines before your procedure if your health care provider instructs you not to. ? Taking new dietary supplements or medicines. Do not take these during the week before your procedure unless your health care provider approves them.  If you are told to take a medicine or to continue taking a medicine on the day of the procedure, take the medicine with sips of water. General instructions   Ask if you will be going home the same day, the following day, or after a longer hospital stay. ? Plan to have someone take you home. ? Plan to have someone stay with you for the first 24 hours after you leave the hospital or clinic.  For 3-6 weeks before the procedure, try not to use any tobacco products, such as cigarettes, chewing tobacco, and e-cigarettes.  You may brush your teeth on the morning of the procedure, but make sure to spit out the toothpaste. What happens during the procedure?  You will be given anesthetics through a mask and through an IV tube in one of your veins.  You may receive medicine to help you relax (sedative).  As soon as you are asleep, a breathing tube may be used to help you breathe.  An anesthesia specialist will stay with you throughout the procedure. He or she will help keep you comfortable and safe by continuing to give you medicines and adjusting the amount of medicine that you get. He or she will also watch your blood pressure, pulse, and oxygen levels to make sure that the anesthetics do not cause any problems.  If a breathing tube was used to help you breathe, it will be removed before you wake up. The procedure  may vary among health care providers and hospitals. What happens after the procedure?  You will wake up, often slowly, after the procedure is complete, usually in a recovery area.  Your blood pressure, heart rate, breathing rate, and blood oxygen level will be monitored until the medicines you were given have worn off.  You may be given medicine to help you calm down if you feel anxious or agitated.  If you will be going home the same day, your  health care provider may check to make sure you can stand, drink, and urinate.  Your health care providers will treat your pain and side effects before you go home.  Do not drive for 24 hours if you received a sedative.  You may: ? Feel nauseous and vomit. ? Have a sore throat. ? Have mental slowness. ? Feel cold or shivery. ? Feel sleepy. ? Feel tired. ? Feel sore or achy, even in parts of your body where you did not have surgery. This information is not intended to replace advice given to you by your health care provider. Make sure you discuss any questions you have with your health care provider. Document Released: 05/21/2007 Document Revised: 07/25/2015 Document Reviewed: 01/26/2015 Elsevier Interactive Patient Education  2018 [place] Anesthesia, Adult, Care After These instructions provide you with information about caring for yourself after your procedure. Your health care provider may also give you more specific instructions. Your treatment has been planned according to current medical practices, but problems sometimes occur. Call your health care provider if you have any problems or questions after your procedure. What can I expect after the procedure? After the procedure, it is common to have:  Vomiting.  A sore throat.  Mental slowness.  It is common to feel:  Nauseous.  Cold or shivery.  Sleepy.  Tired.  Sore or achy, even in parts of your body where you did not have surgery.  Follow these instructions  at home: For at least 24 hours after the procedure:  Do not: ? Participate in activities where you could fall or become injured. ? Drive. ? Use heavy machinery. ? Drink alcohol. ? Take sleeping pills or medicines that cause drowsiness. ? Make important decisions or sign legal documents. ? Take care of children on your own.  Rest. Eating and drinking  If you vomit, drink water, juice, or soup when you can drink without vomiting.  Drink enough fluid to keep your urine clear or pale yellow.  Make sure you have little or no nausea before eating solid foods.  Follow the diet recommended by your health care provider. General instructions  Have a responsible adult stay with you until you are awake and alert.  Return to your normal activities as told by your health care provider. Ask your health care provider what activities are safe for you.  Take over-the-counter and prescription medicines only as told by your health care provider.  If you smoke, do not smoke without supervision.  Keep all follow-up visits as told by your health care provider. This is important. Contact a health care provider if:  You continue to have nausea or vomiting at home, and medicines are not helpful.  You cannot drink fluids or start eating again.  You cannot urinate after 8-12 hours.  You develop a skin rash.  You have fever.  You have increasing redness at the site of your procedure. Get help right away if:  You have difficulty breathing.  You have chest pain.  You have unexpected bleeding.  You feel that you are having a life-threatening or urgent problem. This information is not intended to replace advice given to you by your health care provider. Make sure you discuss any questions you have with your health care provider. Document Released: 05/20/2000 Document Revised: 07/17/2015 Document Reviewed: 01/26/2015 Elsevier Interactive Patient Education  [employer].

## 2017-05-27 ENCOUNTER — Ambulatory Visit: Payer: BLUE CROSS/BLUE SHIELD | Admitting: Family Medicine

## 2017-05-27 ENCOUNTER — Encounter: Payer: Self-pay | Admitting: Family Medicine

## 2017-05-27 VITALS — BP 115/70 | HR 67 | Temp 97.0°F | Ht 69.0 in | Wt 385.0 lb

## 2017-05-27 DIAGNOSIS — G8929 Other chronic pain: Secondary | ICD-10-CM | POA: Diagnosis not present

## 2017-05-27 DIAGNOSIS — M25561 Pain in right knee: Secondary | ICD-10-CM | POA: Diagnosis not present

## 2017-05-27 MED ORDER — METHYLPREDNISOLONE ACETATE 80 MG/ML IJ SUSP
40.0000 mg | Freq: Once | INTRAMUSCULAR | Status: AC
  Administered 2017-05-27: 40 mg via INTRA_ARTICULAR

## 2017-05-27 NOTE — Addendum Note (Signed)
Addended byCarrolyn Leigh on: 05/27/2017 11:31 AM   Modules accepted: Orders

## 2017-05-27 NOTE — Patient Instructions (Signed)
Knee Injection, Care After  Refer to this sheet in the next few weeks. These instructions provide you with information about caring for yourself after your procedure. Your health care provider may also give you more specific instructions. Your treatment has been planned according to current medical practices, but problems sometimes occur. Call your health care provider if you have any problems or questions after your procedure.  What can I expect after the procedure?  After the procedure, it is common to have:   Soreness.   Warmth.   Swelling.    You may have more pain, swelling, and warmth than you did before the injection. This reaction may last for about one day.  Follow these instructions at home:  Bathing   If you were given a bandage (dressing), keep it dry until your health care provider says it can be removed. Ask your health care provider when you can start showering or taking a bath.  Managing pain, stiffness, and swelling   If directed, apply ice to the injection area:  ? Put ice in a plastic bag.  ? Place a towel between your skin and the bag.  ? Leave the ice on for 20 minutes, 2-3 times per day.   Do not apply heat to your knee.   Raise the injection area above the level of your heart while you are sitting or lying down.  Activity   Avoid strenuous activities for as long as directed by your health care provider. Ask your health care provider when you can return to your normal activities.  General instructions   Take medicines only as directed by your health care provider.   Do not take aspirin or other over-the-counter medicines unless your health care provider says you can.   Check your injection site every day for signs of infection. Watch for:  ? Redness, swelling, or pain.  ? Fluid, blood, or pus.   Follow your health care provider's instructions about dressing changes and removal.  Contact a health care provider if:   You have symptoms at your injection site that last longer than  two days after your procedure.   You have redness, swelling, or pain in your injection area.   You have fluid, blood, or pus coming from your injection site.   You have warmth in your injection area.   You have a fever.   Your pain is not controlled with medicine.  Get help right away if:   Your knee turns very red.   Your knee becomes very swollen.   Your knee pain is severe.  This information is not intended to replace advice given to you by your health care provider. Make sure you discuss any questions you have with your health care provider.  Document Released: 03/04/2014 Document Revised: 10/18/2015 Document Reviewed: 12/22/2013  Elsevier Interactive Patient Education  2018 [employer] Inc[employer]

## 2017-05-27 NOTE — Progress Notes (Signed)
Subjective: CC: chronic right knee pain HPI: Marilyn Rivera is a 37 y.o. female presenting to clinic today for:  1. Right knee pain Patient reports a history of chronic right-sided knee pain.  She notes that she has a history of arthroscopy performed many years ago.  She was previously seen by [place] for interval corticosteroid knee injections.  She notes that it has been over 2 years since her last injection.  She denies recent falls/injury.  No lower extremity numbness or tingling.  She is working on weight loss with a trainer and by modifying her diet.  She notes she has upcoming colon surgery.  ROS: Per HPI  Past Medical History:  Diagnosis Date  . Diverticulitis large intestine w/o perforation or abscess w/o bleeding 10/05/2014  . Diverticulosis   . GERD (gastroesophageal reflux disease)    history of H Pylori  . Helicobacter pylori gastritis 09/06/2014  . Iron deficiency anemia   . Kidney stones    Allergies  Allergen Reactions  . Keflex [Cephalexin]     Yeast infections   . Coconut Fatty Acids Itching and Rash    Current Outpatient Medications:  .  aspirin EC 325 MG tablet, Take 325 mg by mouth daily., Disp: , Rfl:  .  ferrous sulfate 325 (65 FE) MG tablet, Take 1 tablet (325 mg total) by mouth 2 (two) times daily with a meal., Disp: 180 tablet, Rfl: 3 .  fluticasone (FLONASE) 50 MCG/ACT nasal spray, Place 2 sprays into both nostrils daily., Disp: 16 g, Rfl: 6 .  ibuprofen (ADVIL,MOTRIN) 200 MG tablet, Take 400 mg by mouth daily as needed for headache or moderate pain., Disp: , Rfl:  .  metroNIDAZOLE (FLAGYL) 500 MG tablet, Take 2 tablets (1,000 mg total) by mouth as directed. The day prior to surgery. Take 2 flagyl 500mg  tablets 2 pm, 3pm, 10pm., Disp: 6 tablet, Rfl: 0 .  montelukast (SINGULAIR) 10 MG tablet, Take 1 tablet (10 mg total) by mouth at bedtime., Disp: 30 tablet, Rfl: 3 .  neomycin (MYCIFRADIN) 500 MG tablet, Take 2 tablets (1,000 mg total)  by mouth as directed. The day prior to surgery. Take 2 neomycin 500mg  tablets 2 pm, 3pm, 10pm., Disp: 6 tablet, Rfl: 0 .  omeprazole (PRILOSEC) 20 MG capsule, TAKE (1) CAPSULE DAILY BEFORE BREAKFAST., Disp: 30 capsule, Rfl: 5 .  promethazine (PHENERGAN) 12.5 MG tablet, Take 1 tablet (12.5 mg total) by mouth every 6 (six) hours as needed for nausea or vomiting., Disp: 30 tablet, Rfl: 0 Social History   Socioeconomic History  . Marital status: Married    Spouse name: Not on file  . Number of children: 0  . Years of education: Not on file  . Highest education level: Not on file  Occupational History  . Occupation: Engineer, petroleum, Warehouse manager for resource side  Social Needs  . Financial resource strain: Not on file  . Food insecurity:    Worry: Not on file    Inability: Not on file  . Transportation needs:    Medical: Not on file    Non-medical: Not on file  Tobacco Use  . Smoking status: Never Smoker  . Smokeless tobacco: Never Used  Substance and Sexual Activity  . Alcohol use: No  . Drug use: No  . Sexual activity: Yes  Lifestyle  . Physical activity:    Days per week: Not on file    Minutes per session: Not on file  . Stress: Not on file  Relationships  .  Social connections:    Talks on phone: Not on file    Gets together: Not on file    Attends religious service: Not on file    Active member of club or organization: Not on file    Attends meetings of clubs or organizations: Not on file    Relationship status: Not on file  . Intimate partner violence:    Fear of current or ex partner: Not on file    Emotionally abused: Not on file    Physically abused: Not on file    Forced sexual activity: Not on file  Other Topics Concern  . Not on file  Social History Narrative  . Not on file   Family History  Problem Relation Age of Onset  . Heart failure Mother   . Hypertension Mother   . Heart disease Mother   . Cancer Other        mother's side of family, breast cancer  .  Cancer Other        father's side of family, leukemia  . Hypertension Father   . Stroke Father 61  . Diabetes Other   . Hypertension Other   . Colon cancer Cousin   . Healthy Brother   . Breast cancer Maternal Aunt 66  . Leukemia Paternal Aunt   . Kidney disease Maternal Grandmother   . Hypertension Maternal Grandmother   . Cancer Maternal Grandfather   . Leukemia Paternal Grandmother   . Cancer Paternal Grandfather     Objective: Office vital signs reviewed. BP 115/70   Pulse 67   Temp (!) 97 F (36.1 C) (Oral)   Ht 5\' 9"  (1.753 m)   Wt (!) 385 lb (174.6 kg)   LMP 05/05/2017 (Approximate)   BMI 56.85 kg/m   Physical Examination:  General: Awake, alert, morbidly obese, No acute distress MSK: Patient has full active range of motion of bilateral lower extremities.  No tenderness to palpation to the patella, quads tendon or patellar tendon.  She has a mild joint effusion noted on the right.  No associated erythema or gross joint swelling.  Many of the knee bony landmarks are obscured by adiposity but are palpable.  JOINT INJECTION:  Patient denies allergy to antiseptics (including iodine) and anesthetics.  Patient denies h/o diabetes, frequent steroid use, use of blood thinners/ antiplatelets.  Patient was given informed consent and a signed copy has been placed in the chart. Appropriate time out was taken. Area prepped and draped in usual sterile fashion. Anatomic landmarks were identified and injection site was marked.  Ethyl chloride spray was used to numb the area and 1 cc of methylprednisolone 40 mg/ml plus  3 cc of 1% lidocaine without epinephrine was injected into the right knee using a(n) lateral approach. The patient tolerated the procedure well and there were no immediate complications. Estimated blood loss is less than 5 cc.  Post procedure instructions were reviewed and handout outlining these instructions were provided to patient.  Assessment/ Plan: 37 y.o. female    1. Chronic pain of right knee Patient was treated with a corticosteroid injection here in office.  See above note.  She tolerated procedure well.  Signs and symptoms of infection were reviewed with the patient.  She was good understanding.  Home care instructions were provided to the patient.  Continue to work on weight loss.  She will follow-up as needed.   [name], DO [place] [phone]

## 2017-05-28 ENCOUNTER — Other Ambulatory Visit: Payer: Self-pay

## 2017-05-28 ENCOUNTER — Ambulatory Visit (HOSPITAL_COMMUNITY): Payer: BLUE CROSS/BLUE SHIELD

## 2017-05-28 ENCOUNTER — Encounter (HOSPITAL_COMMUNITY)
Admission: RE | Admit: 2017-05-28 | Discharge: 2017-05-28 | Disposition: A | Discharge status: Home / Self Care | Payer: BLUE CROSS/BLUE SHIELD | Source: Ambulatory Visit | Attending: General Surgery | Admitting: General Surgery

## 2017-05-28 ENCOUNTER — Encounter (HOSPITAL_COMMUNITY): Payer: Self-pay

## 2017-05-28 ENCOUNTER — Ambulatory Visit (HOSPITAL_COMMUNITY)
Admission: RE | Admit: 2017-05-28 | Discharge: 2017-05-28 | Disposition: A | Discharge status: Home / Self Care | Payer: BLUE CROSS/BLUE SHIELD | Source: Ambulatory Visit | Attending: General Surgery | Admitting: General Surgery

## 2017-05-28 DIAGNOSIS — Z01818 Encounter for other preprocedural examination: Secondary | ICD-10-CM | POA: Insufficient documentation

## 2017-05-28 DIAGNOSIS — E669 Obesity, unspecified: Secondary | ICD-10-CM | POA: Diagnosis not present

## 2017-05-28 DIAGNOSIS — K219 Gastro-esophageal reflux disease without esophagitis: Secondary | ICD-10-CM | POA: Diagnosis not present

## 2017-05-28 DIAGNOSIS — K5732 Diverticulitis of large intestine without perforation or abscess without bleeding: Secondary | ICD-10-CM | POA: Diagnosis not present

## 2017-05-28 DIAGNOSIS — Z823 Family history of stroke: Secondary | ICD-10-CM | POA: Diagnosis not present

## 2017-05-28 DIAGNOSIS — Z6841 Body Mass Index (BMI) 40.0 and over, adult: Secondary | ICD-10-CM | POA: Diagnosis not present

## 2017-05-28 DIAGNOSIS — J984 Other disorders of lung: Secondary | ICD-10-CM | POA: Diagnosis not present

## 2017-05-28 DIAGNOSIS — Z01812 Encounter for preprocedural laboratory examination: Secondary | ICD-10-CM | POA: Diagnosis not present

## 2017-05-28 DIAGNOSIS — Z8249 Family history of ischemic heart disease and other diseases of the circulatory system: Secondary | ICD-10-CM | POA: Insufficient documentation

## 2017-05-28 DIAGNOSIS — Z87442 Personal history of urinary calculi: Secondary | ICD-10-CM | POA: Insufficient documentation

## 2017-05-28 DIAGNOSIS — Z0183 Encounter for blood typing: Secondary | ICD-10-CM | POA: Diagnosis not present

## 2017-05-28 HISTORY — DX: Unspecified osteoarthritis, unspecified site: M19.90

## 2017-05-28 HISTORY — DX: Personal history of urinary calculi: Z87.442

## 2017-05-28 LAB — BASIC METABOLIC PANEL
ANION GAP: 11 (ref 5–15)
BUN: 16 mg/dL (ref 6–20)
CALCIUM: 8.6 mg/dL — AB (ref 8.9–10.3)
CO2: 26 mmol/L (ref 22–32)
Chloride: 110 mmol/L (ref 101–111)
Creatinine, Ser: 0.79 mg/dL (ref 0.44–1.00)
GFR calc non Af Amer: >60 mL/min (ref >60)
GLUCOSE: 85 mg/dL (ref 65–99)
Potassium: 3.2 mmol/L — ABNORMAL LOW (ref 3.5–5.1)
Sodium: 147 mmol/L — ABNORMAL HIGH (ref 135–145)

## 2017-05-28 LAB — CBC WITH DIFFERENTIAL/PLATELET
BASOS ABS: 0 10*3/uL (ref 0.0–0.1)
BASOS PCT: 0 %
EOS ABS: 0 10*3/uL (ref 0.0–0.7)
Eosinophils Relative: 0 %
HCT: 30.3 % — ABNORMAL LOW (ref 36.0–46.0)
HEMOGLOBIN: 8.6 g/dL — AB (ref 12.0–15.0)
Lymphocytes Relative: 29 %
Lymphs Abs: 3.6 10*3/uL (ref 0.7–4.0)
MCH: 20.8 pg — AB (ref 26.0–34.0)
MCHC: 28.4 g/dL — AB (ref 30.0–36.0)
MCV: 73.2 fL — ABNORMAL LOW (ref 78.0–100.0)
MONO ABS: 1.4 10*3/uL (ref 0.1–1.0)
MONOS PCT: 12 %
NEUTROS ABS: 7.3 10*3/uL (ref 1.7–7.7)
Neutrophils Relative %: 59 %
PLATELETS: 286 10*3/uL (ref 150–400)
RBC: 4.14 MIL/uL (ref 3.87–5.11)
RDW: 21.6 % — AB (ref 11.5–15.5)
WBC: 12.3 10*3/uL — ABNORMAL HIGH (ref 4.0–10.5)

## 2017-05-28 LAB — ABO/RH: ABO/RH(D): O POS

## 2017-05-28 LAB — PROTIME-INR
INR: 1.03
PROTHROMBIN TIME: 13.4 s (ref 11.4–15.2)

## 2017-05-28 LAB — HCG, SERUM, QUALITATIVE: Preg, Serum: NEGATIVE

## 2017-05-28 NOTE — Pre-Procedure Instructions (Signed)
Potassium routed to Dr Bridges. 

## 2017-05-29 LAB — PREPARE RBC (CROSSMATCH)

## 2017-06-02 ENCOUNTER — Inpatient Hospital Stay (HOSPITAL_COMMUNITY): Payer: BLUE CROSS/BLUE SHIELD | Admitting: Anesthesiology

## 2017-06-02 ENCOUNTER — Encounter (HOSPITAL_COMMUNITY): Payer: Self-pay | Admitting: Anesthesiology

## 2017-06-02 ENCOUNTER — Inpatient Hospital Stay (HOSPITAL_COMMUNITY)
Admission: RE | Admit: 2017-06-02 | Discharge: 2017-06-06 | DRG: 330 | Disposition: A | Discharge status: Home / Self Care | Payer: BLUE CROSS/BLUE SHIELD | Source: Ambulatory Visit | Attending: General Surgery | Admitting: General Surgery

## 2017-06-02 ENCOUNTER — Encounter (HOSPITAL_COMMUNITY)
Admission: RE | Disposition: A | Discharge status: Home / Self Care | Payer: Self-pay | Source: Ambulatory Visit | Attending: General Surgery

## 2017-06-02 ENCOUNTER — Other Ambulatory Visit: Payer: Self-pay

## 2017-06-02 ENCOUNTER — Inpatient Hospital Stay (HOSPITAL_COMMUNITY): Payer: BLUE CROSS/BLUE SHIELD

## 2017-06-02 DIAGNOSIS — R110 Nausea: Secondary | ICD-10-CM | POA: Diagnosis not present

## 2017-06-02 DIAGNOSIS — K5732 Diverticulitis of large intestine without perforation or abscess without bleeding: Principal | ICD-10-CM

## 2017-06-02 DIAGNOSIS — K5792 Diverticulitis of intestine, part unspecified, without perforation or abscess without bleeding: Secondary | ICD-10-CM | POA: Diagnosis present

## 2017-06-02 DIAGNOSIS — Z91018 Allergy to other foods: Secondary | ICD-10-CM | POA: Diagnosis not present

## 2017-06-02 DIAGNOSIS — Z881 Allergy status to other antibiotic agents status: Secondary | ICD-10-CM

## 2017-06-02 DIAGNOSIS — D509 Iron deficiency anemia, unspecified: Secondary | ICD-10-CM | POA: Diagnosis present

## 2017-06-02 DIAGNOSIS — Z8619 Personal history of other infectious and parasitic diseases: Secondary | ICD-10-CM

## 2017-06-02 DIAGNOSIS — Z6841 Body Mass Index (BMI) 40.0 and over, adult: Secondary | ICD-10-CM

## 2017-06-02 DIAGNOSIS — K219 Gastro-esophageal reflux disease without esophagitis: Secondary | ICD-10-CM | POA: Diagnosis present

## 2017-06-02 DIAGNOSIS — I878 Other specified disorders of veins: Secondary | ICD-10-CM

## 2017-06-02 DIAGNOSIS — R197 Diarrhea, unspecified: Secondary | ICD-10-CM | POA: Diagnosis not present

## 2017-06-02 DIAGNOSIS — K649 Unspecified hemorrhoids: Secondary | ICD-10-CM | POA: Diagnosis present

## 2017-06-02 DIAGNOSIS — Z79899 Other long term (current) drug therapy: Secondary | ICD-10-CM

## 2017-06-02 DIAGNOSIS — Z452 Encounter for adjustment and management of vascular access device: Secondary | ICD-10-CM | POA: Diagnosis not present

## 2017-06-02 HISTORY — PX: PARTIAL COLECTOMY: SHX5273

## 2017-06-02 HISTORY — PX: CENTRAL VENOUS CATHETER INSERTION: SHX401

## 2017-06-02 SURGERY — INSERTION, CATHETER, CENTRAL VENOUS, ADULT
Anesthesia: General | Laterality: Right

## 2017-06-02 MED ORDER — MONTELUKAST SODIUM 10 MG PO TABS
10.0000 mg | ORAL_TABLET | Freq: Every day | ORAL | Status: DC
  Administered 2017-06-02 – 2017-06-05 (×4): 10 mg via ORAL
  Filled 2017-06-02 (×4): qty 1

## 2017-06-02 MED ORDER — SODIUM CHLORIDE 0.9 % IJ SOLN
INTRAMUSCULAR | Status: DC | PRN
  Administered 2017-06-02: 20 mL

## 2017-06-02 MED ORDER — GABAPENTIN 300 MG PO CAPS
300.0000 mg | ORAL_CAPSULE | ORAL | Status: AC
  Administered 2017-06-02: 300 mg via ORAL
  Filled 2017-06-02: qty 1

## 2017-06-02 MED ORDER — MIDAZOLAM HCL 2 MG/2ML IJ SOLN
INTRAMUSCULAR | Status: AC
  Filled 2017-06-02: qty 2

## 2017-06-02 MED ORDER — FENTANYL CITRATE (PF) 100 MCG/2ML IJ SOLN
INTRAMUSCULAR | Status: AC
  Filled 2017-06-02: qty 2

## 2017-06-02 MED ORDER — PROMETHAZINE HCL 25 MG/ML IJ SOLN
12.5000 mg | Freq: Once | INTRAMUSCULAR | Status: AC
  Administered 2017-06-02: 25 mg via INTRAVENOUS

## 2017-06-02 MED ORDER — MEPERIDINE HCL 50 MG/ML IJ SOLN: 6.2500 mg | INTRAMUSCULAR | Status: DC | PRN

## 2017-06-02 MED ORDER — DOCUSATE SODIUM 100 MG PO CAPS
100.0000 mg | ORAL_CAPSULE | Freq: Two times a day (BID) | ORAL | Status: DC
  Administered 2017-06-02 – 2017-06-06 (×8): 100 mg via ORAL
  Filled 2017-06-02 (×9): qty 1

## 2017-06-02 MED ORDER — ONDANSETRON HCL 4 MG/2ML IJ SOLN
INTRAMUSCULAR | Status: AC
  Filled 2017-06-02: qty 2

## 2017-06-02 MED ORDER — ROCURONIUM BROMIDE 50 MG/5ML IV SOLN
INTRAVENOUS | Status: AC
  Filled 2017-06-02: qty 1

## 2017-06-02 MED ORDER — GABAPENTIN 300 MG PO CAPS
300.0000 mg | ORAL_CAPSULE | Freq: Two times a day (BID) | ORAL | Status: DC
  Administered 2017-06-02 – 2017-06-06 (×8): 300 mg via ORAL
  Filled 2017-06-02 (×9): qty 1

## 2017-06-02 MED ORDER — MIDAZOLAM HCL 5 MG/5ML IJ SOLN
INTRAMUSCULAR | Status: DC | PRN
  Administered 2017-06-02 (×2): 1 mg via INTRAVENOUS

## 2017-06-02 MED ORDER — DIPHENHYDRAMINE HCL 12.5 MG/5ML PO ELIX: 12.5000 mg | ORAL_SOLUTION | Freq: Four times a day (QID) | ORAL | Status: DC | PRN

## 2017-06-02 MED ORDER — PANTOPRAZOLE SODIUM 40 MG PO TBEC
40.0000 mg | DELAYED_RELEASE_TABLET | Freq: Every day | ORAL | Status: DC
  Administered 2017-06-03 – 2017-06-06 (×4): 40 mg via ORAL
  Filled 2017-06-02 (×5): qty 1

## 2017-06-02 MED ORDER — PHENYLEPHRINE 40 MCG/ML (10ML) SYRINGE FOR IV PUSH (FOR BLOOD PRESSURE SUPPORT)
PREFILLED_SYRINGE | INTRAVENOUS | Status: AC
  Filled 2017-06-02: qty 10

## 2017-06-02 MED ORDER — BUPIVACAINE LIPOSOME 1.3 % IJ SUSP
INTRAMUSCULAR | Status: AC
  Filled 2017-06-02: qty 20

## 2017-06-02 MED ORDER — METOPROLOL TARTRATE 5 MG/5ML IV SOLN
INTRAVENOUS | Status: AC
  Filled 2017-06-02: qty 5

## 2017-06-02 MED ORDER — ALVIMOPAN 12 MG PO CAPS
12.0000 mg | ORAL_CAPSULE | Freq: Two times a day (BID) | ORAL | Status: DC
  Administered 2017-06-03 – 2017-06-05 (×5): 12 mg via ORAL
  Filled 2017-06-02 (×5): qty 1

## 2017-06-02 MED ORDER — ACETAMINOPHEN 500 MG PO TABS
1000.0000 mg | ORAL_TABLET | ORAL | Status: AC
  Administered 2017-06-02: 1000 mg via ORAL
  Filled 2017-06-02: qty 2

## 2017-06-02 MED ORDER — ACETAMINOPHEN 500 MG PO TABS
1000.0000 mg | ORAL_TABLET | Freq: Four times a day (QID) | ORAL | Status: DC
  Administered 2017-06-02 – 2017-06-06 (×15): 1000 mg via ORAL
  Filled 2017-06-02 (×17): qty 2

## 2017-06-02 MED ORDER — METOPROLOL TARTRATE 5 MG/5ML IV SOLN: 5.0000 mg | Freq: Four times a day (QID) | INTRAVENOUS | Status: DC | PRN

## 2017-06-02 MED ORDER — KETOROLAC TROMETHAMINE 30 MG/ML IJ SOLN
INTRAMUSCULAR | Status: AC
  Filled 2017-06-02: qty 1

## 2017-06-02 MED ORDER — SUCCINYLCHOLINE CHLORIDE 20 MG/ML IJ SOLN
INTRAMUSCULAR | Status: DC | PRN
  Administered 2017-06-02: 200 mg via INTRAVENOUS

## 2017-06-02 MED ORDER — CEFOTETAN DISODIUM-DEXTROSE 2-2.08 GM-%(50ML) IV SOLR
INTRAVENOUS | Status: AC
  Filled 2017-06-02: qty 50

## 2017-06-02 MED ORDER — SODIUM CHLORIDE 0.9 % IV SOLN
INTRAVENOUS | Status: DC | PRN
  Administered 2017-06-02 (×3): via INTRAVENOUS

## 2017-06-02 MED ORDER — BUPIVACAINE LIPOSOME 1.3 % IJ SUSP
INTRAMUSCULAR | Status: DC | PRN
  Administered 2017-06-02: 20 mL

## 2017-06-02 MED ORDER — ONDANSETRON HCL 4 MG/2ML IJ SOLN
4.0000 mg | Freq: Four times a day (QID) | INTRAMUSCULAR | Status: DC | PRN
  Administered 2017-06-02 – 2017-06-03 (×2): 4 mg via INTRAVENOUS
  Filled 2017-06-02 (×2): qty 2

## 2017-06-02 MED ORDER — PROMETHAZINE HCL 25 MG/ML IJ SOLN
INTRAMUSCULAR | Status: AC
  Filled 2017-06-02: qty 1

## 2017-06-02 MED ORDER — FLUTICASONE PROPIONATE 50 MCG/ACT NA SUSP
2.0000 | Freq: Every day | NASAL | Status: DC
  Administered 2017-06-03 – 2017-06-06 (×4): 2 via NASAL
  Filled 2017-06-02 (×2): qty 16

## 2017-06-02 MED ORDER — SUCCINYLCHOLINE CHLORIDE 20 MG/ML IJ SOLN
INTRAMUSCULAR | Status: AC
  Filled 2017-06-02: qty 1

## 2017-06-02 MED ORDER — HEPARIN SOD (PORK) LOCK FLUSH 100 UNIT/ML IV SOLN
INTRAVENOUS | Status: AC
  Filled 2017-06-02: qty 5

## 2017-06-02 MED ORDER — SODIUM CHLORIDE 0.9 % IV SOLN
2.0000 g | Freq: Two times a day (BID) | INTRAVENOUS | Status: AC
  Administered 2017-06-02 – 2017-06-03 (×2): 2 g via INTRAVENOUS
  Filled 2017-06-02 (×2): qty 2

## 2017-06-02 MED ORDER — LACTATED RINGERS IV SOLN
INTRAVENOUS | Status: DC | PRN
  Administered 2017-06-02: 09:00:00 via INTRAVENOUS

## 2017-06-02 MED ORDER — OXYCODONE HCL 5 MG PO TABS
5.0000 mg | ORAL_TABLET | ORAL | Status: DC | PRN
  Administered 2017-06-02 – 2017-06-03 (×4): 10 mg via ORAL
  Filled 2017-06-02 (×4): qty 2

## 2017-06-02 MED ORDER — ONDANSETRON 4 MG PO TBDP
4.0000 mg | ORAL_TABLET | Freq: Four times a day (QID) | ORAL | Status: DC | PRN
  Administered 2017-06-05: 4 mg via ORAL
  Filled 2017-06-02: qty 1

## 2017-06-02 MED ORDER — ROCURONIUM BROMIDE 100 MG/10ML IV SOLN
INTRAVENOUS | Status: DC | PRN
  Administered 2017-06-02: 25 mg via INTRAVENOUS
  Administered 2017-06-02: 50 mg via INTRAVENOUS
  Administered 2017-06-02: 15 mg via INTRAVENOUS

## 2017-06-02 MED ORDER — FENTANYL CITRATE (PF) 100 MCG/2ML IJ SOLN
INTRAMUSCULAR | Status: DC | PRN
  Administered 2017-06-02: 25 ug via INTRAVENOUS
  Administered 2017-06-02: 50 ug via INTRAVENOUS
  Administered 2017-06-02: 100 ug via INTRAVENOUS
  Administered 2017-06-02: 50 ug via INTRAVENOUS
  Administered 2017-06-02: 25 ug via INTRAVENOUS

## 2017-06-02 MED ORDER — PROMETHAZINE HCL 25 MG/ML IJ SOLN: 12.5000 mg | Freq: Once | INTRAMUSCULAR | Status: DC | PRN

## 2017-06-02 MED ORDER — DIPHENHYDRAMINE HCL 50 MG/ML IJ SOLN: 12.5000 mg | Freq: Four times a day (QID) | INTRAMUSCULAR | Status: DC | PRN

## 2017-06-02 MED ORDER — HEPARIN SODIUM (PORCINE) 5000 UNIT/ML IJ SOLN
5000.0000 [IU] | Freq: Once | INTRAMUSCULAR | Status: AC
  Administered 2017-06-02: 5000 [IU] via SUBCUTANEOUS
  Filled 2017-06-02: qty 1

## 2017-06-02 MED ORDER — LIDOCAINE HCL 1 % IJ SOLN
INTRAMUSCULAR | Status: DC | PRN
  Administered 2017-06-02: 35 mg via INTRADERMAL

## 2017-06-02 MED ORDER — ALVIMOPAN 12 MG PO CAPS
12.0000 mg | ORAL_CAPSULE | ORAL | Status: AC
  Administered 2017-06-02: 12 mg via ORAL
  Filled 2017-06-02: qty 1

## 2017-06-02 MED ORDER — FENTANYL CITRATE (PF) 250 MCG/5ML IJ SOLN
INTRAMUSCULAR | Status: AC
  Filled 2017-06-02: qty 5

## 2017-06-02 MED ORDER — SODIUM CHLORIDE 0.9 % IJ SOLN
INTRAMUSCULAR | Status: AC
  Filled 2017-06-02: qty 20

## 2017-06-02 MED ORDER — ENOXAPARIN SODIUM 40 MG/0.4ML ~~LOC~~ SOLN
40.0000 mg | SUBCUTANEOUS | Status: DC
  Administered 2017-06-03: 40 mg via SUBCUTANEOUS
  Filled 2017-06-02: qty 0.4

## 2017-06-02 MED ORDER — CHLORHEXIDINE GLUCONATE CLOTH 2 % EX PADS: 6.0000 | MEDICATED_PAD | Freq: Once | CUTANEOUS | Status: DC

## 2017-06-02 MED ORDER — SODIUM CHLORIDE 0.9% FLUSH
10.0000 mL | INTRAVENOUS | Status: DC | PRN
  Administered 2017-06-02: 20 mL

## 2017-06-02 MED ORDER — LACTATED RINGERS IV SOLN
INTRAVENOUS | Status: DC
  Administered 2017-06-02 – 2017-06-04 (×4): via INTRAVENOUS

## 2017-06-02 MED ORDER — KETOROLAC TROMETHAMINE 30 MG/ML IJ SOLN
30.0000 mg | Freq: Four times a day (QID) | INTRAMUSCULAR | Status: DC
  Administered 2017-06-02: 30 mg via INTRAVENOUS

## 2017-06-02 MED ORDER — SODIUM CHLORIDE 0.9% FLUSH
INTRAVENOUS | Status: AC
  Filled 2017-06-02: qty 30

## 2017-06-02 MED ORDER — PROPOFOL 10 MG/ML IV BOLUS
INTRAVENOUS | Status: AC
  Filled 2017-06-02: qty 40

## 2017-06-02 MED ORDER — SODIUM CHLORIDE 0.9% FLUSH
10.0000 mL | Freq: Two times a day (BID) | INTRAVENOUS | Status: DC
  Administered 2017-06-02 – 2017-06-05 (×5): 10 mL

## 2017-06-02 MED ORDER — DEXAMETHASONE SODIUM PHOSPHATE 4 MG/ML IJ SOLN
INTRAMUSCULAR | Status: AC
  Filled 2017-06-02: qty 2

## 2017-06-02 MED ORDER — HYDROMORPHONE HCL 1 MG/ML IJ SOLN
0.2500 mg | INTRAMUSCULAR | Status: DC | PRN
  Administered 2017-06-02 (×2): 0.5 mg via INTRAVENOUS
  Filled 2017-06-02 (×2): qty 0.5

## 2017-06-02 MED ORDER — SIMETHICONE 80 MG PO CHEW: 40.0000 mg | CHEWABLE_TABLET | Freq: Four times a day (QID) | ORAL | Status: DC | PRN

## 2017-06-02 MED ORDER — SUGAMMADEX SODIUM 500 MG/5ML IV SOLN
INTRAVENOUS | Status: DC | PRN
  Administered 2017-06-02: 400 mg via INTRAVENOUS

## 2017-06-02 MED ORDER — LIDOCAINE HCL (PF) 1 % IJ SOLN
INTRAMUSCULAR | Status: AC
  Filled 2017-06-02: qty 5

## 2017-06-02 MED ORDER — LACTATED RINGERS IV SOLN
INTRAVENOUS | Status: DC
  Administered 2017-06-02: 13:00:00 via INTRAVENOUS

## 2017-06-02 MED ORDER — SODIUM CHLORIDE 0.9% FLUSH
INTRAVENOUS | Status: AC
  Filled 2017-06-02: qty 10

## 2017-06-02 MED ORDER — ONDANSETRON HCL 4 MG/2ML IJ SOLN
INTRAMUSCULAR | Status: DC | PRN
  Administered 2017-06-02: 4 mg via INTRAVENOUS

## 2017-06-02 MED ORDER — MORPHINE SULFATE (PF) 2 MG/ML IV SOLN
2.0000 mg | INTRAVENOUS | Status: DC | PRN
  Administered 2017-06-02 – 2017-06-03 (×8): 2 mg via INTRAVENOUS
  Filled 2017-06-02 (×8): qty 1

## 2017-06-02 MED ORDER — DEXAMETHASONE SODIUM PHOSPHATE 4 MG/ML IJ SOLN
INTRAMUSCULAR | Status: DC | PRN
  Administered 2017-06-02: 8 mg via INTRAVENOUS

## 2017-06-02 MED ORDER — SUGAMMADEX SODIUM 500 MG/5ML IV SOLN
INTRAVENOUS | Status: AC
  Filled 2017-06-02: qty 5

## 2017-06-02 MED ORDER — CEFOTETAN DISODIUM-DEXTROSE 2-2.08 GM-%(50ML) IV SOLR
2.0000 g | INTRAVENOUS | Status: AC
  Administered 2017-06-02: 2 g via INTRAVENOUS
  Filled 2017-06-02: qty 50

## 2017-06-02 MED ORDER — PROPOFOL 10 MG/ML IV BOLUS
INTRAVENOUS | Status: DC | PRN
  Administered 2017-06-02: 200 mg via INTRAVENOUS

## 2017-06-02 SURGICAL SUPPLY — 62 items
BAG HAMPER (MISCELLANEOUS) ×3 IMPLANT
BLADE 10 SAFETY STRL DISP (BLADE) ×3 IMPLANT
BLADE HEX COATED 2.75 (ELECTRODE) ×3 IMPLANT
BLADE SURG SZ10 CARB STEEL (BLADE) ×3 IMPLANT
CHLORAPREP W/TINT 26ML (MISCELLANEOUS) ×6 IMPLANT
CLOTH BEACON ORANGE TIMEOUT ST (SAFETY) ×3 IMPLANT
COVER LIGHT HANDLE STERIS (MISCELLANEOUS) ×6 IMPLANT
COVER MAYO STAND XLG (DRAPE) ×9 IMPLANT
COVER PROBE U/S 5X48 (MISCELLANEOUS) ×3 IMPLANT
DRAPE INCISE IOBAN 66X45 STRL (DRAPES) ×3 IMPLANT
DRAPE UTILITY W/TAPE 26X15 (DRAPES) ×6 IMPLANT
DRAPE WARM FLUID 44X44 (DRAPE) ×3 IMPLANT
DRSG OPSITE POSTOP 4X8 (GAUZE/BANDAGES/DRESSINGS) ×3 IMPLANT
DRSG TEGADERM 4X4.75 (GAUZE/BANDAGES/DRESSINGS) ×3 IMPLANT
ELECT BLADE 6 FLAT ULTRCLN (ELECTRODE) ×3 IMPLANT
ELECT REM PT RETURN 9FT ADLT (ELECTROSURGICAL) ×3
ELECTRODE REM PT RTRN 9FT ADLT (ELECTROSURGICAL) ×2 IMPLANT
GLOVE BIO SURGEON STRL SZ 6.5 (GLOVE) ×6 IMPLANT
GLOVE BIOGEL PI IND STRL 6.5 (GLOVE) ×4 IMPLANT
GLOVE BIOGEL PI IND STRL 7.0 (GLOVE) ×10 IMPLANT
GLOVE BIOGEL PI INDICATOR 6.5 (GLOVE) ×2
GLOVE BIOGEL PI INDICATOR 7.0 (GLOVE) ×5
GLOVE SURG SS PI 7.5 STRL IVOR (GLOVE) ×15 IMPLANT
GOWN STRL REUS W/ TWL XL LVL3 (GOWN DISPOSABLE) ×4 IMPLANT
GOWN STRL REUS W/TWL LRG LVL3 (GOWN DISPOSABLE) ×12 IMPLANT
GOWN STRL REUS W/TWL XL LVL3 (GOWN DISPOSABLE) ×2
HANDLE SUCTION POOLE (INSTRUMENTS) ×2 IMPLANT
INST SET LAPROSCOPIC AP (KITS) ×3 IMPLANT
INST SET MAJOR GENERAL (KITS) ×3 IMPLANT
KIT BLADEGUARD II DBL (SET/KITS/TRAYS/PACK) ×6 IMPLANT
LIGASURE IMPACT 36 18CM CVD LR (INSTRUMENTS) ×3 IMPLANT
LUBRICANT JELLY 4.5OZ STERILE (MISCELLANEOUS) ×3 IMPLANT
MANIFOLD NEPTUNE II (INSTRUMENTS) ×3 IMPLANT
NEEDLE HYPO 18GX1.5 BLUNT FILL (NEEDLE) ×3 IMPLANT
NEEDLE HYPO 21X1.5 SAFETY (NEEDLE) ×3 IMPLANT
NS IRRIG 1000ML POUR BTL (IV SOLUTION) ×9 IMPLANT
PACK LAP CHOLE LZT030E (CUSTOM PROCEDURE TRAY) ×3 IMPLANT
PAD ARMBOARD 7.5X6 YLW CONV (MISCELLANEOUS) ×3 IMPLANT
PENCIL HANDSWITCHING (ELECTRODE) ×6 IMPLANT
RELOAD PROXIMATE 75MM BLUE (ENDOMECHANICALS) ×6 IMPLANT
RETRACTOR WND ALEXIS 25 LRG (MISCELLANEOUS) ×2 IMPLANT
RTRCTR WOUND ALEXIS 25CM LRG (MISCELLANEOUS) ×3
SET BASIN LINEN APH (SET/KITS/TRAYS/PACK) ×3 IMPLANT
SHEET LAVH (DRAPES) ×3 IMPLANT
SPONGE LAP 18X18 X RAY DECT (DISPOSABLE) ×9 IMPLANT
STAPLER GUN LINEAR PROX 60 (STAPLE) ×3 IMPLANT
STAPLER PROXIMATE 75MM BLUE (STAPLE) ×3 IMPLANT
STAPLER VISISTAT (STAPLE) ×3 IMPLANT
SUCTION POOLE HANDLE (INSTRUMENTS) ×3
SUT PDS AB 0 CTX 60 (SUTURE) ×12 IMPLANT
SUT SILK 3 0 SH CR/8 (SUTURE) ×6 IMPLANT
SUT VICRYL 0 UR6 27IN ABS (SUTURE) ×3 IMPLANT
SYR 20CC LL (SYRINGE) ×3 IMPLANT
SYR BULB IRRIGATION 50ML (SYRINGE) ×3 IMPLANT
SYS LAPSCP GELPORT 120MM (MISCELLANEOUS) ×3
SYSTEM LAPSCP GELPORT 120MM (MISCELLANEOUS) ×2 IMPLANT
TRAY CATH 3LUMEN 20C SULFAFREE (CATHETERS) ×3 IMPLANT
TRAY FOLEY W/METER SILVER 16FR (SET/KITS/TRAYS/PACK) ×3 IMPLANT
TROCAR ENDO BLADELESS 11MM (ENDOMECHANICALS) ×3 IMPLANT
TUBING INSUFFLATION (TUBING) ×3 IMPLANT
WARMER LAPAROSCOPE (MISCELLANEOUS) ×3 IMPLANT
YANKAUER SUCT BULB TIP 10FT TU (MISCELLANEOUS) ×12 IMPLANT

## 2017-06-02 NOTE — Anesthesia Procedure Notes (Deleted)
Anesthesia Regional Block: Femoral nerve block   Pre-Anesthetic Checklist: ,, timeout performed, Correct Patient, Correct Site, Correct Laterality, Correct Procedure, Correct Position, site marked, Risks and benefits discussed, at surgeon's request and post-op pain management   Prep: Betadine       Needles:  Injection technique: Single-shot  Needle Type: Stimulator Needle - 80      Needle Gauge: 22   Needle insertion depth: 6 cm   Additional Needles:   Procedures:, nerve stimulator,,,,,,,   Nerve Stimulator or Paresthesia:  Response: Twitch elicited, 0.8 mA,   Additional Responses:   Narrative:   Performed by: Personally  Anesthesiologist: Vena Rua, MD  Additional Notes: BP cuff, EKG monitors applied. Sedation begun. Femoral artery palpated for location of nerve. After nerve location anesthetic injected incrementally, slowly , and after neg aspirations. Tolerated well.

## 2017-06-02 NOTE — Anesthesia Procedure Notes (Signed)
Procedure Name: Intubation Date/Time: 06/02/2017 8:15 AM Performed by: Charmaine Downs, CRNA Pre-anesthesia Checklist: Patient identified, Emergency Drugs available, Suction available and Patient being monitored Patient Re-evaluated:Patient Re-evaluated prior to induction Oxygen Delivery Method: Circle system utilized Preoxygenation: Pre-oxygenation with 100% oxygen Induction Type: IV induction, Cricoid Pressure applied and Rapid sequence Ventilation: Mask ventilation without difficulty Grade View: Grade III Tube size: 7.0 mm Number of attempts: 1 Airway Equipment and Method: Video-laryngoscopy and Rigid stylet Placement Confirmation: ETT inserted through vocal cords under direct vision,  breath sounds checked- equal and bilateral and positive ETCO2 Secured at: 22 cm Tube secured with: Tape Dental Injury: Teeth and Oropharynx as per pre-operative assessment

## 2017-06-02 NOTE — OR Nursing (Signed)
Family updated by Ladona Horns at 1010.

## 2017-06-02 NOTE — Progress Notes (Signed)
[facility]  Difficulty with access. Spoke to patient about central line placement. Risk of bleeding, infection, pneumothorax. She agreed to proceeding with placement.   Will do in OR. Able to get a 20 gauge IV in the left forearm with [place].   Curlene Labrum, MD [facility] [address] [place], [place] [address] [phone] (office)

## 2017-06-02 NOTE — Op Note (Signed)
[facility] Operative Note  06/02/17  Preoperative Diagnosis:  Recurrent Sigmoid diverticulitis; poor vascular access    Postoperative Diagnosis: Same   Procedure(s) Performed:  Partial sigmoid colectomy; right internal jugular central line    Surgeon: Lanell Matar. Constance Haw, MD   Assistants: Aviva Signs, MD    Anesthesia: General endotracheal   Anesthesiologist: Vena Rua, MD    Specimens:  Sigmoid colon    Estimated Blood Loss: 75cc    Blood Replacement: None    Complications: None   Wound Class: Clean Contaminated    Operative Indications: [name] is a 37 yo obese female who has had multiple episodes of diverticulitis in the past few years that have caused her to go to the hospital and are starting to cause significant disruption in her daily life. Her most recent episode required hospitalization for IV antibiotics, and she has continued to have significant pain and nausea weeks later, which is likely from some degree of inflammation and stricture.  After a discussion of the risk and benefits of surgery, including but not limited to bleeding, infection, leak, injury to the ureter or other organs, and need for a colostomy, she opted to proceed.  Due to IV access difficulty, I also discussed with her the risk and benefits of central line placement, including bleeding, infection, and pneumothorax, and she opted to proceed.   Findings: Redundant sigmoid with soft, normal colon proximal and distal without diverticula, short segment thickened and inflamed colon   Procedure: The patient was taken to the operating room and placed supine. General endotracheal anesthesia was induced.  Due to difficulty with IV access prior to the procedure, the patient only had a 20 gauge forearm IV.  A central line was placed due to this poor access.   The right chest and neck was prepped and draped in the usual sterile fashion.  Wearing full gown and gloves, I performed  the procedure.  An ultrasound was utilized to assess the jugular vein, which was large and amenable to placement.  Given there body habitus, I did opted to attempt a subclavian line.  The needle with syringe was advanced under the clavicle but no dark venous return was found. After two attempts, the subclavian approach was aborted, and the right internal jugular was accessed under ultrasound guidance.  The needle was placed into the internal jugular vein and there was dark venous return, and a wire was placed using the Seldinger technique without difficulty.  Ectopia was not noted.  The skin was knicked and a dilator was placed, and the three lumen catheter was placed over the wire with continued control of the wire.  The ultrasound was used to verify that the wire and catheter were in the vein.  There was good draw back of blood from all three lumens and each flushed easily with saline.  The catheter was secured in 4 points with 2-0 silk and a biopatch and dressing was placed. The patient tolerated this portion of the procedure well.  Following central line placement, intravenous antibiotics were administered per protocol.  The patient was placed in lithotomy with all pressure points padded.  An orogastric tube positioned to decompress the stomach, and a foley was placed.   A JACHO approved time out were performed for both the central line and the colectomy.  The abdomen and peritoneum was prepared and draped in the usual sterile fashion.   Plans were for a laparoscopic hand assisted sigmoid colectomy, and a infraumbilical incision was made  to accommodate the hand port.  This was carried down through the subcutaneous tissue with cautery, and the peritoneum was entered with Metzenbaum scissors.  The abdomen was inspected and palpated, and the sigmoid colon was noted to be right under the incision.  There was a short segment of sigmoid colon that was inflamed and thickened with diverticula, and then a redundant  loop distal that was soft and without evidence of diverticula and a soft area just proximal to the segment.   Given this there was limited mobilization that needed to be completed and at that time, I did not think any laparoscopic mobilization was needed, so the laparoscopic portion of the case was aborted.  The small bowel was inspected and retracted to the right with a moist laparotomy pad.  Using electrocautery the colon was freed from the peritoneal attachments along the line of Toldt starting at the softened portion of colon proximally.  There was some inflammatory attachments that were bluntly dissected at the pelvic inlet, and the peritoneal attachments were taken down distally into the pelvic inlet. Due to the redundancy of the sigmoid colon, the short segment of disease colon could essentially be elevated out of the celiotomy with this minimal dissection.  The left ureter was identified deep to the field, and a point on the distal colon that was soft and free of diverticula was chosen and the colon was divided with a linear cutting stapler.  The mesentery was then taken close to the colon with a ligasure device.  The proximal point beyond the thickened segment was identified and cleared off, and again divided with a linear cutting stapler.  Hemostasis of the mesentery was confirmed.  Additional length was obtained proximally by taking the white line of Toldt toward the splenic flexure, and the distal end mesentery was scored.  The two ends overlapped without tension.    At this time, the area was toweled off, and two Alis clamps were used to bring together the lateral edges of the colon just lateral to the tinea of each.  With this configuration, there was no twisting and the bowel could be anastomosed with a side to side anastomosis.  The edges of the staple line were opened and the linear cutting stapler was placed on each end of colon.  The common channel was created, and the common colotomy was then  closed with a TIA bowel load with the staple lines staggered.  The lumen was over 2 finger breadths and the anterior staple line and common colotomy staple line were oversewn with 3-0 silk sutures in a Lembert fashion or by bringing epiploic fat over the staple line.  There was no spillage of contents.  The mesentery was very thickened and collapsed on itself and could not be closed.  The pelvis was irrigated with warm normal saline. The omentum was brought down into the pelvis and the prior laparotomy pads were removed.    All gowns and gloves were changed, and new equipment was utilized. The fascia was closed with a 0 looped PDS suture, and the subcutaneous tissue was irrigated. The skin was closed with staples and a honeycomb dressing was placed.  Final inspection revealed acceptable hemostasis. All counts were correct at the end of the case. The patient was awakened from anesthesia and extubate without complication.  The patient went to the PACU in stable condition. A CXR was obtained in the PACU to confirm CVL placement.    Curlene Labrum, MD [facility] [place]  [place], [place] [address] [phone] (office)

## 2017-06-02 NOTE — Progress Notes (Signed)
[facility]  CVL without pneumothorax. Ok to use. Possibly slightly deep but could be low lung volumes post op that gives that appearance. Will keep for now and not pull back. No ectopia noted during anesthesia.   Curlene Labrum, MD [facility] [address] [place], [place] [address] [phone] (office)

## 2017-06-02 NOTE — Interval H&P Note (Signed)
History and Physical Interval Note:  06/02/2017 7:11 AM  Marilyn Rivera  has presented today for surgery, with the diagnosis of diverticulitis  The various methods of treatment have been discussed with the patient and family. After consideration of risks, benefits and other options for treatment, the patient has consented to  Procedure(s): HAND ASSISTED LAPAROSCOPIC PARTIAL SIGMOID COLECTOMY (POSSIBLE OPEN) (N/A) as a surgical intervention .  The patient's history has been reviewed, patient examined, no change in status, stable for surgery.  I have reviewed the patient's chart and labs.  Questions were answered to the patient's satisfaction.    Went over risk and benefits of procedure again including but not limited to bleeding, infection, leak, injury to ureter or other organ, need for ostomy. Also went over blood being available if needed. Patient agrees.    Virl Cagey

## 2017-06-02 NOTE — Anesthesia Preprocedure Evaluation (Addendum)
Anesthesia Evaluation  Patient identified by MRN, date of birth, ID band Patient awake    Reviewed: Allergy & Precautions, NPO status , Patient's Chart, lab work & pertinent test results  History of Anesthesia Complications (+) PONV and DIFFICULT AIRWAY  Airway Mallampati: III  TM Distance: >3 FB     Dental  (+) Teeth Intact   Pulmonary neg pulmonary ROS, shortness of breath and with exertion, asthma ,     + decreased breath sounds      Cardiovascular negative cardio ROS   Rhythm:Regular Rate:Normal  Possible h/o HTN, not on any antiHTN meds   Neuro/Psych negative neurological ROS  negative psych ROS   GI/Hepatic negative GI ROS, Neg liver ROS, GERD  ,H/o diverticulitis   Endo/Other  negative endocrine ROSMorbid obesity  Renal/GU negative Renal ROS  negative genitourinary   Musculoskeletal negative musculoskeletal ROS (+)   Abdominal (+) + obese,   Peds negative pediatric ROS (+)  Hematology negative hematology ROS (+)   Anesthesia Other Findings   Reproductive/Obstetrics negative OB ROS                            Anesthesia Physical Anesthesia Plan  ASA: III  Anesthesia Plan: General   Post-op Pain Management:    Induction: Intravenous  PONV Risk Score and Plan: 2  Airway Management Planned: Oral ETT  Additional Equipment:   Intra-op Plan:   Post-operative Plan: Extubation in OR  Informed Consent: I have reviewed the patients History and Physical, chart, labs and discussed the procedure including the risks, benefits and alternatives for the proposed anesthesia with the patient or authorized representative who has indicated his/her understanding and acceptance.   Dental advisory given  Plan Discussed with:   Anesthesia Plan Comments:         Anesthesia Quick Evaluation

## 2017-06-02 NOTE — Transfer of Care (Signed)
Immediate Anesthesia Transfer of Care Note  Patient: Marilyn Rivera  Procedure(s) Performed: INSERTION CENTRAL LINE RIGHT INTERNAL JUGULAR (Right ) PARTIAL SIGMOID COLECTOMY (OPEN) (N/A )  Patient Location: PACU  Anesthesia Type:General  Level of Consciousness: drowsy and patient cooperative  Airway & Oxygen Therapy: Patient Spontanous Breathing and Patient connected to face mask oxygen  Post-op Assessment: Report given to RN, Post -op Vital signs reviewed and stable and Patient moving all extremities  Post vital signs: Reviewed and stable  Last Vitals:  Vitals Value Taken Time  BP    Temp    Pulse 89 06/02/2017 11:07 AM  Resp    SpO2 89 % 06/02/2017 11:07 AM  Vitals shown include unvalidated device data.  Last Pain:  Vitals:   06/02/17 0654  TempSrc: Oral  PainSc: 3       Patients Stated Pain Goal: 7 (88/32/54 9826)  Complications: No apparent anesthesia complications

## 2017-06-02 NOTE — Anesthesia Postprocedure Evaluation (Signed)
Anesthesia Post Note  Patient: Marilyn Rivera  Procedure(s) Performed: INSERTION CENTRAL LINE RIGHT INTERNAL JUGULAR (Right ) PARTIAL SIGMOID COLECTOMY (OPEN) (N/A )  Patient location during evaluation: PACU Anesthesia Type: General Level of consciousness: awake and patient cooperative Pain management: pain level controlled Vital Signs Assessment: post-procedure vital signs reviewed and stable Respiratory status: spontaneous breathing, nonlabored ventilation, respiratory function stable and aerosol facemask Cardiovascular status: blood pressure returned to baseline Anesthetic complications: yes Anesthetic complication details: PONVComments: Small amount of vomiting; vss     Last Vitals:  Vitals:   06/02/17 0654 06/02/17 1107  Pulse: 78   Resp: 18   Temp: 36.8 C (P) 36.8 C  SpO2: 94% (P) 98%    Last Pain:  Vitals:   06/02/17 0654  TempSrc: Oral  PainSc: 3                  Aarya Robinson J

## 2017-06-03 ENCOUNTER — Telehealth: Payer: Self-pay | Admitting: Family Medicine

## 2017-06-03 ENCOUNTER — Encounter (HOSPITAL_COMMUNITY): Payer: Self-pay | Admitting: General Surgery

## 2017-06-03 LAB — CBC
HEMATOCRIT: 30 % — AB (ref 36.0–46.0)
HEMOGLOBIN: 8.6 g/dL — AB (ref 12.0–15.0)
MCH: 21.1 pg — ABNORMAL LOW (ref 26.0–34.0)
MCHC: 28.7 g/dL — ABNORMAL LOW (ref 30.0–36.0)
MCV: 73.5 fL — AB (ref 78.0–100.0)
Platelets: 228 10*3/uL (ref 150–400)
RBC: 4.08 MIL/uL (ref 3.87–5.11)
RDW: 20.6 % — ABNORMAL HIGH (ref 11.5–15.5)
WBC: 14.3 10*3/uL — AB (ref 4.0–10.5)

## 2017-06-03 LAB — BASIC METABOLIC PANEL
ANION GAP: 8 (ref 5–15)
BUN: 6 mg/dL (ref 6–20)
CHLORIDE: 107 mmol/L (ref 101–111)
CO2: 23 mmol/L (ref 22–32)
Calcium: 8.4 mg/dL — ABNORMAL LOW (ref 8.9–10.3)
Creatinine, Ser: 0.8 mg/dL (ref 0.44–1.00)
GFR calc Af Amer: >60 mL/min (ref >60)
GLUCOSE: 106 mg/dL — AB (ref 65–99)
POTASSIUM: 3.6 mmol/L (ref 3.5–5.1)
Sodium: 138 mmol/L (ref 135–145)

## 2017-06-03 LAB — MAGNESIUM: Magnesium: 1.7 mg/dL (ref 1.7–2.4)

## 2017-06-03 LAB — PHOSPHORUS: Phosphorus: 3.7 mg/dL (ref 2.5–4.6)

## 2017-06-03 MED ORDER — OXYCODONE HCL 5 MG PO TABS
5.0000 mg | ORAL_TABLET | ORAL | Status: DC | PRN
  Administered 2017-06-03 – 2017-06-05 (×3): 5 mg via ORAL
  Filled 2017-06-03 (×3): qty 1

## 2017-06-03 MED ORDER — ENOXAPARIN SODIUM 100 MG/ML ~~LOC~~ SOLN
85.0000 mg | SUBCUTANEOUS | Status: DC
  Administered 2017-06-04 – 2017-06-06 (×3): 85 mg via SUBCUTANEOUS
  Filled 2017-06-03 (×3): qty 1

## 2017-06-03 MED ORDER — POTASSIUM CHLORIDE 20 MEQ PO PACK
40.0000 meq | PACK | Freq: Once | ORAL | Status: AC
  Administered 2017-06-03: 40 meq via ORAL
  Filled 2017-06-03: qty 2

## 2017-06-03 MED ORDER — MAGNESIUM SULFATE 2 GM/50ML IV SOLN
2.0000 g | Freq: Once | INTRAVENOUS | Status: AC
  Administered 2017-06-03: 2 g via INTRAVENOUS
  Filled 2017-06-03: qty 50

## 2017-06-03 MED ORDER — OXYCODONE HCL 5 MG PO TABS
5.0000 mg | ORAL_TABLET | ORAL | Status: DC
  Administered 2017-06-03 – 2017-06-06 (×19): 5 mg via ORAL
  Filled 2017-06-03 (×19): qty 1

## 2017-06-03 NOTE — Progress Notes (Signed)
[facility] Progress Note  1 Day Post-Op  Subjective: Having significant pain per report. Otherwise no complaints. No BM or flatus.   Objective: Vital signs in last 24 hours: Temp:  [98.1 F (36.7 C)-98.9 F (37.2 C)] 98.4 F (36.9 C) (04/09 1020) Pulse Rate:  [70-94] 70 (04/09 1020) Resp:  [18-29] 19 (04/09 1020) BP: (129-171)/(57-89) 137/62 (04/09 1020) SpO2:  [95 %-98 %] 96 % (04/09 0555)    Intake/Output from previous day: 04/08 0701 - 04/09 0700 In: 5031.3 [P.O.:360; I.V.:4571.3; IV Piggyback:100] Out: 3125 [Urine:3050; Blood:75] Intake/Output this shift: Total I/O In: 120 [P.O.:120] Out: -   General appearance: alert, cooperative and no distress Resp: normal work breathing GI: soft, distended, appropriately tender, incision c/d/i with dressing Extremities: extremities normal, atraumatic, no cyanosis or edema  Lab Results:  Recent Labs    06/03/17 0452  WBC 14.3*  HGB 8.6*  HCT 30.0*  PLT 228   BMET Recent Labs    06/03/17 0452  NA 138  K 3.6  CL 107  CO2 23  GLUCOSE 106*  BUN 6  CREATININE 0.80  CALCIUM 8.4*   PT/INR No results for input(s): LABPROT, INR in the last 72 hours.  Studies/Results: Dg Chest Port 1 View  Result Date: 06/02/2017 CLINICAL DATA:  Status post central line placement EXAM: PORTABLE CHEST 1 VIEW COMPARISON:  05/28/2017 FINDINGS: Cardiac shadow is within normal limits. The right jugular central line is noted with catheter tip in the mid right atrium. No pneumothorax is noted. The overall inspiratory effort is poor. No bony abnormality is noted. IMPRESSION: No pneumothorax following central line placement. Catheter tip is projecting over the right atrium but may be accentuated by the poor inspiratory effort. Electronically Signed   By: Inez Catalina M.D.   On: 06/02/2017 11:25    Anti-infectives: Anti-infectives (From admission, onward)   Start     Dose/Rate Route Frequency Ordered Stop   06/02/17 2200   cefoTEtan (CEFOTAN) 2 g in sodium chloride 0.9 % 100 mL IVPB     2 g 200 mL/hr over 30 Minutes Intravenous Every 12 hours 06/02/17 1350 06/03/17 2159   06/02/17 0624  cefoTEtan in Dextrose 5% (CEFOTAN) IVPB 2 g     2 g Intravenous On call to O.R. 06/02/17 0624 06/02/17 0810      Assessment/Plan: [name] is a 37 yo POD 1 s/p INSERTION CENTRAL LINE RIGHT INTERNAL JUGULAR For poor access and s/p PARTIAL SIGMOID COLECTOMY (OPEN) for chronic diverticulitis. Doing fair.  -Scheduled Roxicodone in addition to tylenol, Toradol scheduled -PRN in addition -IS, needs to ambulate, asked RN to make sure respiratory brings IS to patient room  -Clear diet, awaiting bowel function -Lytes replaced, IVF @75  -D/c foley -SCDs, lovenox    LOS: 1 day    Virl Cagey 06/03/2017

## 2017-06-03 NOTE — Anesthesia Postprocedure Evaluation (Signed)
Anesthesia Post Note  Patient: QUITA MCGRORY  Procedure(s) Performed: INSERTION CENTRAL LINE RIGHT INTERNAL JUGULAR (Right ) PARTIAL SIGMOID COLECTOMY (OPEN) (N/A )  Patient location during evaluation: Nursing Unit Anesthesia Type: General Level of consciousness: awake and patient cooperative Pain management: pain level controlled Vital Signs Assessment: post-procedure vital signs reviewed and stable Respiratory status: spontaneous breathing, nonlabored ventilation and respiratory function stable Cardiovascular status: blood pressure returned to baseline Postop Assessment: no apparent nausea or vomiting and adequate PO intake Anesthetic complications: no     Last Vitals:  Vitals:   06/03/17 0237 06/03/17 0555  BP:  140/60  Pulse:  81  Resp:  18  Temp: 36.9 C 36.7 C  SpO2:  96%    Last Pain:  Vitals:   06/03/17 0632  TempSrc:   PainSc: 8                  Zyrah Wiswell J

## 2017-06-03 NOTE — Addendum Note (Signed)
Addendum  created 06/03/17 1021 by Charmaine Downs, CRNA   Sign clinical note

## 2017-06-03 NOTE — Telephone Encounter (Signed)
Called to check in on patient who is now status post colectomy.  She notes that she continues to have pain but that pain medication is helping somewhat.  She is currently waiting for bowel function to return.  She anticipates discharge in about a week or so.  I encouraged her to follow-up with me in outpatient once she is discharged.  I will continue to follow along with her care and progress while hospitalized.  Deepest appreciation for the excellent care being provided to her at [facility].  Koleen Distance. Lajuana Ripple, [facility]

## 2017-06-03 NOTE — Progress Notes (Signed)
Patient provided incentive spirometer and educated on it's use by this RN. Patient was hesitant to use and states "it's going to hurt too much", advised patient about importance of using IS and patient then accepted. 750 achieved. Patient O2 also low due to shallow breathing, 2L O2 placed with improvement to 96%, will continue to monitor and remove when able. Patient ambulated halfway down [phone] hall with assistance from NT, tolerated well.

## 2017-06-04 LAB — BASIC METABOLIC PANEL
Anion gap: 8 (ref 5–15)
BUN: 7 mg/dL (ref 6–20)
CO2: 24 mmol/L (ref 22–32)
Calcium: 8.2 mg/dL — ABNORMAL LOW (ref 8.9–10.3)
Chloride: 106 mmol/L (ref 101–111)
Creatinine, Ser: 0.9 mg/dL (ref 0.44–1.00)
GFR calc non Af Amer: >60 mL/min (ref >60)
Glucose, Bld: 83 mg/dL (ref 65–99)
POTASSIUM: 3.6 mmol/L (ref 3.5–5.1)
Sodium: 138 mmol/L (ref 135–145)

## 2017-06-04 LAB — CBC
HEMATOCRIT: 28.4 % — AB (ref 36.0–46.0)
HEMOGLOBIN: 8 g/dL — AB (ref 12.0–15.0)
MCH: 20.8 pg — AB (ref 26.0–34.0)
MCHC: 28.2 g/dL — AB (ref 30.0–36.0)
MCV: 74 fL — ABNORMAL LOW (ref 78.0–100.0)
Platelets: 204 10*3/uL (ref 150–400)
RBC: 3.84 MIL/uL — ABNORMAL LOW (ref 3.87–5.11)
RDW: 21 % — ABNORMAL HIGH (ref 11.5–15.5)
WBC: 10.4 10*3/uL (ref 4.0–10.5)

## 2017-06-04 LAB — MAGNESIUM: Magnesium: 1.8 mg/dL (ref 1.7–2.4)

## 2017-06-04 LAB — PHOSPHORUS: PHOSPHORUS: 3.5 mg/dL (ref 2.5–4.6)

## 2017-06-04 MED ORDER — CYCLOBENZAPRINE HCL 10 MG PO TABS: 5.0000 mg | ORAL_TABLET | Freq: Three times a day (TID) | ORAL | Status: DC | PRN

## 2017-06-04 MED ORDER — POTASSIUM CHLORIDE 20 MEQ PO PACK
20.0000 meq | PACK | Freq: Two times a day (BID) | ORAL | Status: AC
  Administered 2017-06-04 (×2): 20 meq via ORAL
  Filled 2017-06-04 (×2): qty 1

## 2017-06-04 MED ORDER — MAGNESIUM SULFATE 2 GM/50ML IV SOLN
2.0000 g | Freq: Once | INTRAVENOUS | Status: AC
  Administered 2017-06-04: 2 g via INTRAVENOUS
  Filled 2017-06-04: qty 50

## 2017-06-04 NOTE — Progress Notes (Signed)
Patient ambulated down [phone] hallway with assistance of NT, tolerated well. Also used incentive spirometer with achievement of 1500. Nasal cannula removed, patient sats between 96-97% on room air. Patient also states that she had second bowel movement around shift change, unseen by RN.

## 2017-06-04 NOTE — Progress Notes (Signed)
Patient had first BM this evening, it was bloody. I paged Dr. Constance Haw to inform her of the situation, she said it is to be expected due to her surgery. I will relay this information to the patient. Will continue to monitor.

## 2017-06-04 NOTE — Progress Notes (Signed)
[facility] Progress Note  2 Days Post-Op  Subjective: Taking in some clears. Feeling some nausea. Only flatus once. No BM. Did ambulate last night and did some IS. Encouraged and explained importance.   Objective: Vital signs in last 24 hours: Temp:  [98 F (36.7 C)-98.6 F (37 C)] 98 F (36.7 C) (04/10 0055) Pulse Rate:  [73-90] 90 (04/10 0055) Resp:  [18-20] 20 (04/10 0055) BP: (130-145)/(69-73) 130/70 (04/10 0055) SpO2:  [86 %-100 %] 100 % (04/10 0055)    Intake/Output from previous day: 04/09 0701 - 04/10 0700 In: 1797.5 [P.O.:600; I.V.:1097.5; IV Piggyback:100] Out: -  Intake/Output this shift: No intake/output data recorded.  General appearance: alert, cooperative and no distress Resp: normal work breathing GI: soft, nondistended, incision site c/d/i with dressing, no erythema or drainage noted Extremities: extremities normal, atraumatic, no cyanosis or edema  Lab Results:  Recent Labs    06/03/17 0452 06/04/17 0528  WBC 14.3* 10.4  HGB 8.6* 8.0*  HCT 30.0* 28.4*  PLT 228 204   BMET Recent Labs    06/03/17 0452 06/04/17 0528  NA 138 138  K 3.6 3.6  CL 107 106  CO2 23 24  GLUCOSE 106* 83  BUN 6 7  CREATININE 0.80 0.90  CALCIUM 8.4* 8.2*    Assessment/Plan: [name] is a 37 yo POD 2 s/p INSERTION CENTRAL LINE RIGHT INTERNAL JUGULAR for poor access and s/p PARTIAL SIGMOID COLECTOMY (OPEN) for chronic diverticulitis.   -Continue scheduled Roxicodone, Tylenol, Toradol -Adding flexeril PRN for possible spasms  -Roxi and Morphine PRN in addition -IS, continue to ambulate -Encouraged IS and importance of IS, on [place] now, but should be able to wean off  -Clear diet, awaiting bowel function -Lytes replaced, stopped IVF, encourage liquid intake  -SCDs, lovenox  -Will need to be on Iron/ Vit C for anemia she had prior to surgery but will await bowel function    LOS: 2 days    Virl Cagey 06/04/2017

## 2017-06-05 LAB — CBC WITH DIFFERENTIAL/PLATELET
BASOS ABS: 0 10*3/uL (ref 0.0–0.1)
Basophils Relative: 1 %
EOS PCT: 4 %
Eosinophils Absolute: 0.3 10*3/uL (ref 0.0–0.7)
HCT: 28.6 % — ABNORMAL LOW (ref 36.0–46.0)
Hemoglobin: 8.3 g/dL — ABNORMAL LOW (ref 12.0–15.0)
LYMPHS PCT: 37 %
Lymphs Abs: 2.9 10*3/uL (ref 0.7–4.0)
MCH: 21.2 pg — ABNORMAL LOW (ref 26.0–34.0)
MCHC: 29 g/dL — ABNORMAL LOW (ref 30.0–36.0)
MCV: 73.1 fL — AB (ref 78.0–100.0)
Monocytes Absolute: 0.8 10*3/uL (ref 0.1–1.0)
Monocytes Relative: 11 %
NEUTROS ABS: 3.7 10*3/uL (ref 1.7–7.7)
NEUTROS PCT: 47 %
PLATELETS: 212 10*3/uL (ref 150–400)
RBC: 3.91 MIL/uL (ref 3.87–5.11)
RDW: 20.7 % — ABNORMAL HIGH (ref 11.5–15.5)
WBC: 7.7 10*3/uL (ref 4.0–10.5)

## 2017-06-05 LAB — BASIC METABOLIC PANEL
ANION GAP: 11 (ref 5–15)
BUN: 6 mg/dL (ref 6–20)
CALCIUM: 8.4 mg/dL — AB (ref 8.9–10.3)
CHLORIDE: 104 mmol/L (ref 101–111)
CO2: 24 mmol/L (ref 22–32)
Creatinine, Ser: 0.76 mg/dL (ref 0.44–1.00)
GFR calc non Af Amer: >60 mL/min (ref >60)
Glucose, Bld: 83 mg/dL (ref 65–99)
POTASSIUM: 3.5 mmol/L (ref 3.5–5.1)
Sodium: 139 mmol/L (ref 135–145)

## 2017-06-05 LAB — MAGNESIUM: Magnesium: 1.8 mg/dL (ref 1.7–2.4)

## 2017-06-05 LAB — PHOSPHORUS: Phosphorus: 3.2 mg/dL (ref 2.5–4.6)

## 2017-06-05 MED ORDER — VITAMIN C 500 MG PO TABS
500.0000 mg | ORAL_TABLET | Freq: Every day | ORAL | Status: DC
  Administered 2017-06-05 – 2017-06-06 (×2): 500 mg via ORAL
  Filled 2017-06-05 (×2): qty 1

## 2017-06-05 MED ORDER — IBUPROFEN 600 MG PO TABS: 600.0000 mg | ORAL_TABLET | Freq: Four times a day (QID) | ORAL | Status: DC | PRN

## 2017-06-05 MED ORDER — FERROUS SULFATE 325 (65 FE) MG PO TABS
325.0000 mg | ORAL_TABLET | Freq: Two times a day (BID) | ORAL | Status: DC
  Administered 2017-06-05 – 2017-06-06 (×2): 325 mg via ORAL
  Filled 2017-06-05 (×2): qty 1

## 2017-06-05 NOTE — Progress Notes (Signed)
[facility] Progress Note  3 Days Post-Op  Subjective: No major issues. Having some nausea this Am but no vomiting. Having BMs. Multiple and stopped having bloody BM.  Objective: Vital signs in last 24 hours: Temp:  [97.8 F (36.6 C)] 97.8 F (36.6 C) (04/11 0632) Pulse Rate:  [84-86] 84 (04/11 0632) Resp:  [16-20] 20 (04/11 2229) BP: (122-145)/(65) 122/65 (04/11 0632) SpO2:  [94 %-96 %] 94 % (04/11 7989) Last BM Date: 06/04/17  Intake/Output from previous day: 04/10 0701 - 04/11 0700 In: 480 [P.O.:480] Out: -  Intake/Output this shift: No intake/output data recorded.  General appearance: alert, cooperative and no distress Resp: normal work breathing GI: soft, nondistended, appropriately tender, honeycomb dressing in place no erythema or drainage Extremities: extremities normal, atraumatic, no cyanosis or edema  Lab Results:  Recent Labs    06/04/17 0528 06/05/17 0644  WBC 10.4 7.7  HGB 8.0* 8.3*  HCT 28.4* 28.6*  PLT 204 212   BMET Recent Labs    06/04/17 0528 06/05/17 0644  NA 138 139  K 3.6 3.5  CL 106 104  CO2 24 24  GLUCOSE 83 83  BUN 7 6  CREATININE 0.90 0.76  CALCIUM 8.2* 8.4*   Anti-infectives Assessment/Plan: [name] is a 37 yo POD 3 s/pINSERTION CENTRAL LINE RIGHT INTERNAL JUGULAR for poor access and s/pPARTIAL SIGMOID COLECTOMY (OPEN)for chronic diverticulitis.   -Continue scheduled Roxicodone, Tylenol and ibuprofen, Roxi and Morphine, Flexeril PRN -IS, ambulate -Reg diet, small frequent meals recommended, having BMs multiple since yesterday  -Lytes ok  -H&H up from 8 to 8.3, bloody BM not causing drop  -SCDs, lovenox -Iron/ Vit C for anemia she had prior to surgery  -CVL day 4, will d/c tomorrow at discharge      LOS: 3 days    Marilyn Rivera 06/05/2017

## 2017-06-06 MED ORDER — CYCLOBENZAPRINE HCL 5 MG PO TABS: 5.0000 mg | ORAL_TABLET | Freq: Three times a day (TID) | ORAL | 0 refills | Status: DC | PRN

## 2017-06-06 MED ORDER — ASCORBIC ACID 500 MG PO TABS: 500.0000 mg | ORAL_TABLET | Freq: Every day | ORAL | 3 refills | Status: DC

## 2017-06-06 MED ORDER — OXYCODONE HCL 5 MG PO TABS: 5.0000 mg | ORAL_TABLET | ORAL | 0 refills | Status: DC | PRN

## 2017-06-06 MED ORDER — ONDANSETRON 4 MG PO TBDP: 4.0000 mg | ORAL_TABLET | Freq: Four times a day (QID) | ORAL | 0 refills | Status: DC | PRN

## 2017-06-06 MED ORDER — DOCUSATE SODIUM 100 MG PO CAPS: 100.0000 mg | ORAL_CAPSULE | Freq: Two times a day (BID) | ORAL | 0 refills | Status: DC

## 2017-06-06 NOTE — Progress Notes (Signed)
Pt's IJ removed and site stable, dry, intact.

## 2017-06-06 NOTE — Progress Notes (Signed)
Patient has discharge papers and letter from Dr. Constance Haw.  She is waiting on her husband to pick her up from hospital.

## 2017-06-06 NOTE — Discharge Instructions (Signed)
Discharge Instructions: Shower per your regular routine. Take tylenol and ibuprofen as needed for pain control, alternating every 4-6 hours.  Take Roxicodone for breakthrough pain. Take colace for constipation related to narcotic pain medication. Do not pick at the staples on your incision sites.  Dry gauze dressing as desired at the site. The dressing on your neck can be removed in 48 hours.    Open Sigmoid Colectomy, Care After This sheet gives you information about how to care for yourself after your procedure. Your health care provider may also give you more specific instructions. If you have problems or questions, contact your health care provider. What can I expect after the procedure? After the procedure, it is common to have:  Pain in your abdomen, especially along your incision.  Tiredness. Your energy level will return to normal over the next several weeks.  Constipation.  Nausea.  Difficulty urinating.  Follow these instructions at home: Activity  You may be able to return to most of your normal activities within 1-2 weeks, such as working, walking up stairs, and sexual activity.  Avoid activities that require a lot of energy for 4-6 weeks after surgery, such as running, climbing, and lifting heavy objects. Ask your health care provider what activities are safe for you.  Take rest breaks during the day as needed.  Do not drive for 1-2 weeks or until your health care provider says that it is safe.  Do not drive or use heavy machinery while taking prescription pain medicines.  Do not lift anything that is heavier than 10 lb (4.3 kg) until your health care provider says that it is safe. Incision care  Follow instructions from your health care provider about how to take care of your incision. Make sure you: ? Wash your hands with soap and water before you change your bandage (dressing). If soap and water are not available, use hand sanitizer. ? Change your dressing  as told by your health care provider. ? Leave stitches (sutures) or staples in place. These skin closures may need to stay in place for 2 weeks or longer.  Avoid wearing tight clothing around your incision.  Protect your incision area from the sun.  Check your incision area every day for signs of infection. Check for: ? More redness, swelling, or pain. ? More fluid or blood. ? Warmth. ? Pus or a bad smell. General instructions  Do not take baths, swim, or use a hot tub until your health care provider approves. Ask your health care provider when you may shower.  Take over-the-counter and prescription medicines, including stool softeners, only as told by your health care provider.  Eat a low-fat and low-fiber diet for the first 4 weeks after surgery.  Keep all follow-up visits as told by your health care provider. This is important. Contact a health care provider if:  You have more redness, swelling, or pain around your incision.  You have more fluid or blood coming from your incision.  Your incision feels warm to the touch.  You have pus or a bad smell coming from your incision.  You have a fever or chills.  You do not have a bowel movement 2-3 days after surgery.  You cannot eat or drink for 24 hours or more.  You have persistent nausea and vomiting.  You have abdominal pain that gets worse and does not get better with medicine. Get help right away if:  You have chest pain.  You have shortness of breath.  You have pain or swelling in your legs.  Your incision breaks open after your sutures or staples have been removed.  You have bleeding from the rectum. This information is not intended to replace advice given to you by your health care provider. Make sure you discuss any questions you have with your health care provider. Document Released: 09/04/2010 Document Revised: 11/13/2015 Document Reviewed: 11/13/2015 Elsevier Interactive Patient Education  [employer].

## 2017-06-06 NOTE — Discharge Summary (Signed)
Physician Discharge Summary  Patient ID: Marilyn Rivera MRN: [id number] DOB/AGE: 02/26/1981 37 y.o.  Admit date: 06/02/2017 Discharge date: 06/06/2017  Admission Diagnoses:  Discharge Diagnoses:  Active Problems:   Diverticulitis of colon   Diverticulitis   Poor venous access   Discharged Condition: good  Hospital Course: [name] is a 37 yo with a history of multiple recurrent episodes of diverticulitis requiring hospitalization who underwent an elective sigmoid colectomy. She did well post operatively, and started on a diet and was advanced without issues. She was ambulating, had adequate pain control, and was feeling well prior to her discharge home.   Consults: None  Significant Diagnostic Studies: None   Treatments: Partial sigmoid colectomy 4/8, R IJ CVL for poor venous access   Discharge Exam: Blood pressure 131/67, pulse 79, temperature 98.3 F (36.8 C), temperature source Oral, resp. rate 20, height 5\' 9"  (1.753 m), weight (!) 385 lb (174.6 kg), SpO2 94 %. General appearance: alert, cooperative and no distress Resp: normal work breathing GI: soft, nondistended, appropriately tender, staples c/d/i with no erythema or drainage Extremities: extremities normal, atraumatic, no cyanosis or edema  Disposition: Discharge disposition: 01-Home or Self Care       Discharge Instructions    Call MD for:  difficulty breathing, headache or visual disturbances   Complete by:  As directed    Call MD for:  extreme fatigue   Complete by:  As directed    Call MD for:  persistant dizziness or light-headedness   Complete by:  As directed    Call MD for:  persistant nausea and vomiting   Complete by:  As directed    Call MD for:  redness, tenderness, or signs of infection (pain, swelling, redness, odor or green/yellow discharge around incision site)   Complete by:  As directed    Call MD for:  severe uncontrolled pain   Complete by:  As directed    Call MD for:  temperature  >100.4   Complete by:  As directed    Diet - low sodium heart healthy   Complete by:  As directed    Discontinue central line   Complete by:  As directed    Increase activity slowly   Complete by:  As directed      Allergies as of 06/06/2017      Reactions   Keflex [cephalexin]    Yeast infections    Coconut Fatty Acids Itching, Rash      Medication List    STOP taking these medications   neomycin 500 MG tablet Commonly known as:  MYCIFRADIN   promethazine 12.5 MG tablet Commonly known as:  PHENERGAN     TAKE these medications   ascorbic acid 500 MG tablet Commonly known as:  VITAMIN C Take 1 tablet (500 mg total) by mouth daily. Start taking on:  06/07/2017   aspirin EC 325 MG tablet Take 325 mg by mouth daily.   cyclobenzaprine 5 MG tablet Commonly known as:  FLEXERIL Take 1 tablet (5 mg total) by mouth 3 (three) times daily as needed for muscle spasms.   docusate sodium 100 MG capsule Commonly known as:  COLACE Take 1 capsule (100 mg total) by mouth 2 (two) times daily.   ferrous sulfate 325 (65 FE) MG tablet Take 1 tablet (325 mg total) by mouth 2 (two) times daily with a meal.   fluticasone 50 MCG/ACT nasal spray Commonly known as:  FLONASE Place 2 sprays into both nostrils daily.  ibuprofen 200 MG tablet Commonly known as:  ADVIL,MOTRIN Take 400 mg by mouth daily as needed for headache or moderate pain.   montelukast 10 MG tablet Commonly known as:  SINGULAIR Take 1 tablet (10 mg total) by mouth at bedtime.   omeprazole 20 MG capsule Commonly known as:  PRILOSEC TAKE (1) CAPSULE DAILY BEFORE BREAKFAST.   ondansetron 4 MG disintegrating tablet Commonly known as:  ZOFRAN-ODT Take 1 tablet (4 mg total) by mouth every 6 (six) hours as needed for nausea.   oxyCODONE 5 MG immediate release tablet Commonly known as:  Oxy IR/ROXICODONE Take 1-2 tablets (5-10 mg total) by mouth every 4 (four) hours as needed for severe pain or breakthrough pain.       Follow-up Information    Virl Cagey, MD Follow up on 06/12/2017.   Specialty:  General Surgery Contact information: [address] [place] [address] [phone]           Signed: Virl Cagey 06/06/2017, 10:41 AM

## 2017-06-11 LAB — BPAM RBC
BLOOD PRODUCT EXPIRATION DATE: 201904162359
BLOOD PRODUCT EXPIRATION DATE: 201905032359
ISSUE DATE / TIME: 201904170842
UNIT TYPE AND RH: 5100
UNIT TYPE AND RH: 5100

## 2017-06-11 LAB — TYPE AND SCREEN
ABO/RH(D): O POS
ANTIBODY SCREEN: NEGATIVE
UNIT DIVISION: 0
Unit division: 0

## 2017-06-12 ENCOUNTER — Encounter: Payer: Self-pay | Admitting: General Surgery

## 2017-06-12 ENCOUNTER — Ambulatory Visit: Payer: BLUE CROSS/BLUE SHIELD | Admitting: General Surgery

## 2017-06-12 ENCOUNTER — Ambulatory Visit (INDEPENDENT_AMBULATORY_CARE_PROVIDER_SITE_OTHER): Payer: Self-pay | Admitting: General Surgery

## 2017-06-12 VITALS — BP 152/95 | HR 83 | Temp 98.4°F | Resp 18 | Ht 69.0 in | Wt 384.0 lb

## 2017-06-12 DIAGNOSIS — K5792 Diverticulitis of intestine, part unspecified, without perforation or abscess without bleeding: Secondary | ICD-10-CM

## 2017-06-12 MED ORDER — OXYCODONE HCL 5 MG PO TABS: 5.0000 mg | ORAL_TABLET | ORAL | 0 refills | Status: DC | PRN

## 2017-06-12 NOTE — Progress Notes (Signed)
[facility] Note   HPI:  37 y.o. Female presents to clinic for post-op follow-up evaluation after a sigmoid colectomy. Patient reports she is doing well. She is still having some soreness and pain, but is ambulating and feeling better. Some pain with BMs as far as cramping in her stomach. Having regular BMs. Did have a dark stool yesterday.   Review of Systems:  No fevers or chills BMs daily Dark BM yesterday/ black All other review of systems: otherwise negative   Pathology: Diagnosis Colon, segmental resection, sigmoid - COLON WITH DIVERTICULITIS - THREE REACTIVE LYMPH NODES (0/3) - VIABLE MARGINS - NO MALIGNANCY IDENTIFIED  Vital Signs:  BP (!) 152/95   Pulse 83   Temp 98.4 F (36.9 C)   Resp 18   Ht 5\' 9"  (1.753 m)   Wt (!) 384 lb (174.2 kg)   BMI 56.71 kg/m    Physical Exam:  Physical Exam  Constitutional: She appears well-developed.  HENT:  Head: Normocephalic.  Eyes: Pupils are equal, round, and reactive to light.  Neck: Normal range of motion. Neck supple.  Cardiovascular: Normal rate.  Pulmonary/Chest: Effort normal.  Abdominal: Soft. She exhibits no distension. There is no tenderness.  Staples removed, incision c/d/i with no erythema or drainage, steri strips applied  Skin: Skin is warm and dry.  Vitals reviewed.   Laboratory studies: None    Imaging:  None    Assessment:  37 y.o. yo Female s/p sigmoid colectomy. Doing fair. Wanting to return to work 06/23/17.   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  Curlene Labrum, MD [facility] [address] [place], [place] [address] [phone] (office)

## 2017-06-12 NOTE — Patient Instructions (Signed)
Steri strips will start to fall off in a few days to week, once peeling you can remove them.  No lifting > 10 lbs, or excessive bending, squatting until 07/28/2017.

## 2017-07-01 ENCOUNTER — Encounter: Payer: Self-pay | Admitting: Family Medicine

## 2017-07-01 ENCOUNTER — Encounter: Payer: Self-pay | Admitting: General Surgery

## 2017-07-01 ENCOUNTER — Ambulatory Visit (INDEPENDENT_AMBULATORY_CARE_PROVIDER_SITE_OTHER): Payer: Self-pay | Admitting: General Surgery

## 2017-07-01 VITALS — BP 168/89 | HR 81 | Temp 98.6°F | Resp 18 | Ht 70.0 in | Wt 387.0 lb

## 2017-07-01 DIAGNOSIS — K5792 Diverticulitis of intestine, part unspecified, without perforation or abscess without bleeding: Secondary | ICD-10-CM

## 2017-07-01 NOTE — Progress Notes (Signed)
[facility] Note   HPI:  37 y.o. Female presents to clinic for follow-up evaluation after sigmoid colectomy. Patient reports she is doing well. Her pain is much better. She is eating and drinking. She is not having any more dark stools.   Review of Systems:  No fevers or chills Regular Bms  All other review of systems: otherwise negative   Vital Signs:  BP (!) 168/89 (BP Location: Left Wrist, Patient Position: Sitting, Cuff Size: Normal)   Pulse 81   Temp 98.6 F (37 C) (Temporal)   Resp 18   Ht 5\' 10"  (1.778 m)   Wt (!) 387 lb (175.5 kg)   BMI 55.53 kg/m    Physical Exam:  Physical Exam  Constitutional: She appears well-developed and well-nourished.  Cardiovascular: Normal rate.  Pulmonary/Chest: Effort normal.  Abdominal: Soft. She exhibits no distension. There is no tenderness.  Well healed incision  Vitals reviewed.   Laboratory studies: None   Imaging:  None   Assessment:  37 y.o. yo Female s/p sigmoid colectomy for diverticulitis. Doing great. Feeling good. Back at work with restrictions.   Plan:  - Will see back 07/22/2017 to fully release for 07/28/2017 which is 8 weeks out  - No lifting > 10 lbs, excessive squatting or bending until 07/28/2017   All of the above recommendations were discussed with the patient, and all of patient's were answered to her expressed satisfaction.  Curlene Labrum, MD [facility] [address] [place], [place] [address] [phone] (office)

## 2017-07-01 NOTE — Patient Instructions (Signed)
No lifting > 10 lbs until 07/28/2017.

## 2017-07-22 ENCOUNTER — Encounter: Payer: Self-pay | Admitting: General Surgery

## 2017-07-22 ENCOUNTER — Ambulatory Visit (INDEPENDENT_AMBULATORY_CARE_PROVIDER_SITE_OTHER): Payer: Self-pay | Admitting: General Surgery

## 2017-07-22 VITALS — BP 164/72 | HR 65 | Temp 98.7°F | Resp 22 | Wt 386.0 lb

## 2017-07-22 DIAGNOSIS — K5792 Diverticulitis of intestine, part unspecified, without perforation or abscess without bleeding: Secondary | ICD-10-CM

## 2017-07-22 NOTE — Progress Notes (Signed)
[facility] Note   HPI:  37 y.o. Female presents to clinic for follow-up evaluation after sigmoid colectomy. Patient reports she is doing well. She is back to work with restrictions. Has some pain with eating nuts. Some numbness around incision.  Review of Systems:  Eating, having BMs Minimal to no pain All other review of systems: otherwise negative   Vital Signs:  BP (!) 164/72 (BP Location: Left Arm, Patient Position: Sitting, Cuff Size: Large)   Pulse 65   Temp 98.7 F (37.1 C) (Oral)   Resp (!) 22   Wt (!) 386 lb (175.1 kg)   BMI 55.39 kg/m    Physical Exam:  Physical Exam  Constitutional: She appears well-developed.  HENT:  Head: Normocephalic.  Eyes: Pupils are equal, round, and reactive to light.  Pulmonary/Chest: Effort normal.  Abdominal: Soft. She exhibits no distension. There is no tenderness. No hernia.  Well healed incision    Laboratory studies: None   Imaging:  None    Assessment:  37 y.o. yo Female s/p sigmoid colectomy 06/01/2017 for chronic diverticulitis. Doing great. Numbness may be chronic or may improve from skin incision.   Plan:  - To work without restrictions 07/28/2017   - Follow up PRN   All of the above recommendations were discussed with the patient, and all of patient's questions were answered to her expressed satisfaction.  Curlene Labrum, MD [facility] [address] [place] [address] [place], [place] [address] [phone] (office)

## 2017-07-30 ENCOUNTER — Encounter: Payer: Self-pay | Admitting: Internal Medicine
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
## 2017-09-10 ENCOUNTER — Emergency Department (HOSPITAL_BASED_OUTPATIENT_CLINIC_OR_DEPARTMENT_OTHER)
Admission: EM | Admit: 2017-09-10 | Discharge: 2017-09-10 | Disposition: A | Discharge status: Home / Self Care | Payer: BLUE CROSS/BLUE SHIELD | Attending: Emergency Medicine | Admitting: Emergency Medicine

## 2017-09-10 ENCOUNTER — Encounter: Payer: Self-pay | Admitting: Family Medicine

## 2017-09-10 ENCOUNTER — Other Ambulatory Visit: Payer: Self-pay

## 2017-09-10 ENCOUNTER — Encounter (HOSPITAL_BASED_OUTPATIENT_CLINIC_OR_DEPARTMENT_OTHER): Payer: Self-pay

## 2017-09-10 ENCOUNTER — Encounter: Payer: Self-pay | Admitting: Gastroenterology

## 2017-09-10 DIAGNOSIS — K0889 Other specified disorders of teeth and supporting structures: Secondary | ICD-10-CM | POA: Insufficient documentation

## 2017-09-10 DIAGNOSIS — Z5321 Procedure and treatment not carried out due to patient leaving prior to being seen by health care provider: Secondary | ICD-10-CM | POA: Diagnosis not present

## 2017-09-10 NOTE — ED Triage Notes (Signed)
C/o left lower toothache x 1 month-worse x 1 week-no dentist f/u-NAD-steady gait

## 2017-09-10 NOTE — ED Notes (Signed)
Called x 2 no answer

## 2017-09-12 ENCOUNTER — Ambulatory Visit: Payer: BLUE CROSS/BLUE SHIELD | Admitting: Family Medicine

## 2017-09-16 ENCOUNTER — Encounter: Payer: Self-pay | Admitting: Family Medicine

## 2017-10-13 ENCOUNTER — Other Ambulatory Visit: Payer: Self-pay | Admitting: Nurse Practitioner

## 2017-11-14 ENCOUNTER — Ambulatory Visit: Payer: BLUE CROSS/BLUE SHIELD | Admitting: Family Medicine

## 2017-12-03 ENCOUNTER — Ambulatory Visit: Payer: BLUE CROSS/BLUE SHIELD | Admitting: Gastroenterology

## 2017-12-11 ENCOUNTER — Encounter: Payer: Self-pay | Admitting: Family Medicine

## 2017-12-11 ENCOUNTER — Ambulatory Visit (INDEPENDENT_AMBULATORY_CARE_PROVIDER_SITE_OTHER): Payer: BLUE CROSS/BLUE SHIELD | Admitting: Family Medicine

## 2017-12-11 VITALS — BP 134/87 | HR 73 | Temp 97.9°F | Ht 69.0 in | Wt 395.0 lb

## 2017-12-11 DIAGNOSIS — Z2821 Immunization not carried out because of patient refusal: Secondary | ICD-10-CM

## 2017-12-11 DIAGNOSIS — M62838 Other muscle spasm: Secondary | ICD-10-CM

## 2017-12-11 MED ORDER — INSULIN PEN NEEDLE 32G X 6 MM MISC: 1 refills | Status: DC

## 2017-12-11 MED ORDER — LIRAGLUTIDE -WEIGHT MANAGEMENT 18 MG/3ML ~~LOC~~ SOPN: PEN_INJECTOR | SUBCUTANEOUS | 1 refills | Status: AC

## 2017-12-11 MED ORDER — DICLOFENAC SODIUM 75 MG PO TBEC: 75.0000 mg | DELAYED_RELEASE_TABLET | Freq: Two times a day (BID) | ORAL | 0 refills | Status: DC | PRN

## 2017-12-11 MED ORDER — CYCLOBENZAPRINE HCL 5 MG PO TABS: 5.0000 mg | ORAL_TABLET | Freq: Three times a day (TID) | ORAL | 0 refills | Status: DC | PRN

## 2017-12-11 NOTE — Addendum Note (Signed)
Addended by: [name] on: 12/11/2017 11:54 AM   Modules accepted: Orders

## 2017-12-11 NOTE — Patient Instructions (Addendum)
You have prescribed a nonsteroidal anti-inflammatory drug (NSAID) today. This will help with your pain and inflammation. Please do not take any other NSAIDs (ibuprofen/Motrin/Advil, naproxen/Aleve, meloxicam/Mobic, Voltaren/diclofenac). Please make sure to eat a meal when taking this medication.   Caution:  If you have a history of acid reflux/indigestion, I recommend that you take an antacid (such as Prilosec, Prevacid) daily while on the NSAID.  If you have a history of bleeding disorder, gastric ulcer, are on a blood thinner (like warfarin/Coumadin, Xarelto, Eliquis, etc) please do not take NSAID.  If you have ever had a heart attack, you should not take NSAIDs.   I am prescribing Saxenda.  Start with 0.6mg  injected into the skin daily x4 weeks.  Plan to see me in 2-4 weeks for weight check up.  In preparation for our upcoming appointment regarding weight loss, I would like you to answer the following questions on a separate piece of paper and bring them with you to your appointment.   Why do you want to lose weight?  Does obesity run in your family?  What has been your highest weight/ lowest weight in the past?  What did you weigh when you were 37 years old?  What is your goal weight?  What diets/ medications have you tried and did they work?  What side effects did they have if any?  What are your "eating/ binging" triggers?  Have you ever seen a therapist regarding your eating habits?  Are you willing to?  Would you be willing to see a dietician?  Are you willing to meet with me monthly?  Do you have any personal history of thyroid disease (including thyroid cancer), pancreatitis, diabetes, high blood pressure, high cholesterol, heart attack, stroke?  Do you have any family history of thyroid disease (including thyroid cancer), pancreatitis, diabetes, high blood pressure, high cholesterol, heart attack, stroke?  If so, please list who.  Have you ever been diagnosed or had  a mental health disorder (addiction, depression, anxiety, bipolar disorder, schizophrenia, etc)?  Is there family history of mental health disorders (addiction, depression, anxiety, bipolar disorder, schizophrenia, etc)?  Would you be interested in bariatric surgery if you were determined to be a candidate?  Please also bring a 3 day food journal to that appointment.  Document EVERYTHING (including snacks/ candies/ food tastings/ drinks), what time of day you ate them and what you were doing and feeling when you ate/ drink (were you driving/ rushing to get somewhere?  Were you seated at the dinner table/ watching tv?  Were you lonely/ upset/ happy/ celebrating?)   Acute Torticollis, Adult Torticollis is a condition in which the muscles of the neck tighten (contract) abnormally, causing the neck to twist and the head to move into an unnatural position. Torticollis that develops suddenly is called acute torticollis. People with acute torticollis may have trouble turning their head. The condition can be painful and may range from mild to severe. What are the causes? This condition may be caused by:  Sleeping in an awkward position (common).  Extending or twisting the neck muscles beyond their normal position.  An injury to the neck muscles.  An infection.  A tumor.  Certain medicines.  Long-lasting spasms of the neck muscles.  In some cases, the cause may not be known. What increases the risk? You are more likely to develop this condition if:  You have a condition associated with loose ligaments, such as Down syndrome.  You have a brain condition that affects  vision, such as strabismus.  What are the signs or symptoms? The main symptom of this condition is tilting of the head to one side. Other symptoms include:  Pain in the neck.  Trouble turning the head from side to side or up and down.  How is this diagnosed? This condition may be diagnosed based on:  A physical  exam.  Your medical history.  Imaging tests, such as: ? An X-ray. ? An ultrasound. ? A CT scan. ? An MRI.  How is this treated? Treatment for this condition depends on what is causing the condition. Mild cases may go away without treatment. Treatment for more serious cases may include:  Medicines or shots to relax the muscles.  Other medicines, such as antibiotics to treat the underlying cause.  Wearing a soft neck collar.  Physical therapy and stretching to improve neck strength and flexibility.  Neck massage.  In severe cases, surgery may be needed to repair dislocated or broken bones or to treat nerves in the neck. Follow these instructions at home:  Take over-the-counter and prescription medicines only as told by your health care provider.  Do stretching exercises and massage your neck as told by your health care provider.  If directed, apply heat to the affected area as often as told by your health care provider. Use the heat source that your health care provider recommends, such as a moist heat pack or a heating pad. ? Place a towel between your skin and the heat source. ? Leave the heat on for 20-30 minutes. ? Remove the heat if your skin turns bright red. This is especially important if you are unable to feel pain, heat, or cold. You may have a greater risk of getting burned.  If you wake up with torticollis after sleeping, check your bed or sleeping area. Look for lumpy pillows or unusual objects. Make sure your bed and sleeping area are comfortable.  Keep all follow-up visits as told by your health care provider. This is important. Contact a health care provider if:  You have a fever.  Your symptoms do not improve or they get worse. Get help right away if:  You have trouble breathing.  You develop noisy breathing (stridor).  You start to drool.  You have trouble swallowing or pain when swallowing.  You develop numbness or weakness in your hands or  feet.  You have changes in your speech, understanding, or vision.  You are in severe pain.  You cannot move your head or neck. Summary  Torticollis is a condition in which the muscles of the neck tighten (contract) abnormally, causing the neck to twist and the head to move into an unnatural position. Torticollis that develops suddenly is called acute torticollis.  Treatment for this condition depends on what is causing the condition. Mild cases may go away without treatment.  Do stretching exercises and massage your neck as told by your health care provider. You may also be instructed to apply heat to the area.  Contact your health care provider if your symptoms do not improve or they get worse. This information is not intended to replace advice given to you by your health care provider. Make sure you discuss any questions you have with your health care provider. Document Released: 02/09/2000 Document Revised: 04/11/2016 Document Reviewed: 04/11/2016 Elsevier Interactive Patient Education  [employer].

## 2017-12-11 NOTE — Progress Notes (Signed)
Subjective: CC: neck pain PCP: [name], DO ZOX:WRUEAV V Marilyn Rivera is a 37 y.o. female presenting to clinic today for:  1. Neck pain Patient reports onset of right-sided neck pain this morning.  She notes it hurts to turn her head to the left.  Denies any preceding injury.  No upper extremity weakness.  No numbness or tingling.  She took 1200 mg of ibuprofen this morning and has been using heat in efforts to relieve it.  Denies history of injury to the neck.  2.  Obesity Patient notes that she has been considering injectable for obesity.  No history of thyroid cancers, pancreatitis or MEN tumors.  No family history of these either.  She has been working out but notes that chronic knee pain does limit her some.  Family history is significant for heart disease in her mother, hypertension in both her mother and father and type 2 diabetes in her maternal grandfather.   ROS: Per HPI  Allergies  Allergen Reactions  . Keflex [Cephalexin]     Yeast infections   . Coconut Fatty Acids Itching and Rash   Past Medical History:  Diagnosis Date  . Arthritis   . Diverticulitis large intestine w/o perforation or abscess w/o bleeding 10/05/2014  . Diverticulosis   . GERD (gastroesophageal reflux disease)    history of H Pylori  . Helicobacter pylori gastritis 09/06/2014  . History of kidney stones   . Iron deficiency anemia   . Kidney stones     Current Outpatient Medications:  .  aspirin EC 325 MG tablet, Take 325 mg by mouth daily., Disp: , Rfl:  .  fluticasone (FLONASE) 50 MCG/ACT nasal spray, Place 2 sprays into both nostrils daily., Disp: 16 g, Rfl: 6 .  ibuprofen (ADVIL,MOTRIN) 200 MG tablet, Take 400 mg by mouth daily as needed for headache or moderate pain., Disp: , Rfl:  .  montelukast (SINGULAIR) 10 MG tablet, Take 1 tablet (10 mg total) by mouth at bedtime., Disp: 30 tablet, Rfl: 3 .  omeprazole (PRILOSEC) 20 MG capsule, TAKE (1) CAPSULE DAILY BEFORE BREAKFAST., Disp: 90  capsule, Rfl: 3 .  ondansetron (ZOFRAN-ODT) 4 MG disintegrating tablet, Take 1 tablet (4 mg total) by mouth every 6 (six) hours as needed for nausea., Disp: 20 tablet, Rfl: 0 Social History   Socioeconomic History  . Marital status: Married    Spouse name: Not on file  . Number of children: 0  . Years of education: Not on file  . Highest education level: Not on file  Occupational History  . Occupation: Engineer, petroleum, Warehouse manager for resource side  Social Needs  . Financial resource strain: Not on file  . Food insecurity:    Worry: Not on file    Inability: Not on file  . Transportation needs:    Medical: Not on file    Non-medical: Not on file  Tobacco Use  . Smoking status: Never Smoker  . Smokeless tobacco: Never Used  Substance and Sexual Activity  . Alcohol use: No  . Drug use: No  . Sexual activity: Not on file  Lifestyle  . Physical activity:    Days per week: Not on file    Minutes per session: Not on file  . Stress: Not on file  Relationships  . Social connections:    Talks on phone: Not on file    Gets together: Not on file    Attends religious service: Not on file    Active member of club  or organization: Not on file    Attends meetings of clubs or organizations: Not on file    Relationship status: Not on file  . Intimate partner violence:    Fear of current or ex partner: Not on file    Emotionally abused: Not on file    Physically abused: Not on file    Forced sexual activity: Not on file  Other Topics Concern  . Not on file  Social History Narrative  . Not on file   Family History  Problem Relation Age of Onset  . Heart failure Mother   . Hypertension Mother   . Heart disease Mother   . Cancer Other        mother's side of family, breast cancer  . Cancer Other        father's side of family, leukemia  . Hypertension Father   . Stroke Father 10  . Diabetes Other   . Hypertension Other   . Colon cancer Cousin   . Healthy Brother   . Breast cancer  Maternal Aunt 70  . Leukemia Paternal Aunt   . Kidney disease Maternal Grandmother   . Hypertension Maternal Grandmother   . Cancer Maternal Grandfather   . Leukemia Paternal Grandmother   . Cancer Paternal Grandfather     Objective: Office vital signs reviewed. BP 134/87   Pulse 73   Temp 97.9 F (36.6 C) (Oral)   Ht 5\' 9"  (1.753 m)   Wt (!) 395 lb (179.2 kg)   BMI 58.33 kg/m   Physical Examination:  General: Awake, alert, morbidly obese, No acute distress C-spine: Patient has reduced rotation in both directions by about 45 degrees.  She has no midline tenderness to palpation.  She has mild paraspinal muscle tenderness palpation along the trapezius on the right.  Negative [place].  Assessment/ Plan: 37 y.o. female   1. Muscle spasms of neck Likely torticollis/muscle spasm.  I prescribed her a muscle relaxer and diclofenac.  She is to start the diclofenac tomorrow.  We discussed that the max dose of ibuprofen is 800 mg.  She is use no other NSAIDs while taking the prescribed NSAID.  Continue heat if needed.  Reasons for return discussed.  2. Morbid obesity ([place]) Saxenda prescribed.  Patient to follow-up in 2 to 4 weeks.  She will complete the questionnaire prior to that visit.  3. Refused influenza vaccine Counseled.   Meds ordered this encounter  Medications  . cyclobenzaprine (FLEXERIL) 5 MG tablet    Sig: Take 1 tablet (5 mg total) by mouth 3 (three) times daily as needed for muscle spasms.    Dispense:  30 tablet    Refill:  0  . diclofenac (VOLTAREN) 75 MG EC tablet    Sig: Take 1 tablet (75 mg total) by mouth 2 (two) times daily as needed for moderate pain (take w/ food).    Dispense:  30 tablet    Refill:  0  . Liraglutide -Weight Management (SAXENDA) 18 MG/3ML SOPN    Sig: Inject 0.6 mg into the skin daily for 28 days, THEN 1.2 mg daily for 28 days.    Dispense:  3 mL    Refill:  [place], DO [place] [phone]

## 2017-12-29 ENCOUNTER — Encounter: Payer: Self-pay | Admitting: Family Medicine

## 2017-12-29 ENCOUNTER — Ambulatory Visit (INDEPENDENT_AMBULATORY_CARE_PROVIDER_SITE_OTHER): Payer: BLUE CROSS/BLUE SHIELD | Admitting: Family Medicine

## 2017-12-29 DIAGNOSIS — N898 Other specified noninflammatory disorders of vagina: Secondary | ICD-10-CM | POA: Diagnosis not present

## 2017-12-29 NOTE — Progress Notes (Signed)
Subjective: CC: Initial weight management appointment HPI: Marilyn Rivera is a 37 y.o. female that presents today for initial weight loss visit.  Patient reports that weight loss is important to them because "I just need to lose weight".  They note that obesity does run in the family; dad was heavy.  Maximum weight: 416#.  High school weight: 180#.  Goal weight: 298#.  Patient wishes to achieve the goal by: going to start back w/ trainer.  Medications tried in the past: none.  Side effects: na.  Diet is tried in the past: Walked and clean eating and lost about 40# in 2015.  In 2017, lost 40# again after regaining.  She was working out.  She reports eating one meal a day usually. Eating triggers identified: scheduling limits her ability to cook for herself.  She often eats out meals or meals prepared by other people. Drinks about 2 cans of soda per day or tea.  Patient has not seen a therapist in the past for eating behaviors.  Patient has not seen a dietitian in the past.  Patient's occupation: customer service (seated).  Patient gets 7 hours of sleep each night.  Denies apnea, frequent nighttime awakenings, poor energy, snoring.   Possible weight altering medications: Not on Atypical antipsychotics/ antidepressents, oral corticosteroids, Depo-Provera, insulin  Past medical history Negative for thyroid disease (including thyroid cancer), MEN, pancreatitis, diabetes, high blood pressure, high cholesterol, heart attack, stroke. Negative Mental health disorders including addiction, depression, anxiety, bipolar disorder, OCD.  Family history Negative for thyroid disease (including thyroid cancer), MEN, pancreatitis, diabetes, high blood pressure, high cholesterol, heart attack, stroke.  Negative for Mental health disorders including addiction, depression, anxiety, bipolar disorder, OCD.  Vaginal irritation Patient reports recent onset and describes an internal burning after use of a douche.  She  reports symptoms seem to be worse with ambulation but do not occur outside of this.  Denies any abnormal vaginal discharge, itching or vaginal odors.  Past Medical History:  Diagnosis Date  . Arthritis   . Diverticulitis large intestine w/o perforation or abscess w/o bleeding 10/05/2014  . Diverticulosis   . GERD (gastroesophageal reflux disease)    history of H Pylori  . Helicobacter pylori gastritis 09/06/2014  . History of kidney stones   . Iron deficiency anemia   . Kidney stones     Current Outpatient Medications:  .  aspirin EC 325 MG tablet, Take 325 mg by mouth daily., Disp: , Rfl:  .  cyclobenzaprine (FLEXERIL) 5 MG tablet, Take 1 tablet (5 mg total) by mouth 3 (three) times daily as needed for muscle spasms., Disp: 30 tablet, Rfl: 0 .  diclofenac (VOLTAREN) 75 MG EC tablet, Take 1 tablet (75 mg total) by mouth 2 (two) times daily as needed for moderate pain (take w/ food)., Disp: 30 tablet, Rfl: 0 .  fluticasone (FLONASE) 50 MCG/ACT nasal spray, Place 2 sprays into both nostrils daily., Disp: 16 g, Rfl: 6 .  ibuprofen (ADVIL,MOTRIN) 200 MG tablet, Take 400 mg by mouth daily as needed for headache or moderate pain., Disp: , Rfl:  .  Insulin Pen Needle (BD PEN NEEDLE MICRO U/F) 32G X 6 MM MISC, UAD to inject Saxenda daily., Disp: 100 each, Rfl: 1 .  Liraglutide -Weight Management (SAXENDA) 18 MG/3ML SOPN, Inject 0.6 mg into the skin daily for 28 days, THEN 1.2 mg daily for 28 days., Disp: 3 mL, Rfl: 1 .  montelukast (SINGULAIR) 10 MG tablet, Take 1 tablet (10 mg  total) by mouth at bedtime., Disp: 30 tablet, Rfl: 3 .  omeprazole (PRILOSEC) 20 MG capsule, TAKE (1) CAPSULE DAILY BEFORE BREAKFAST., Disp: 90 capsule, Rfl: 3 .  ondansetron (ZOFRAN-ODT) 4 MG disintegrating tablet, Take 1 tablet (4 mg total) by mouth every 6 (six) hours as needed for nausea., Disp: 20 tablet, Rfl: 0 Social History   Socioeconomic History  . Marital status: Married    Spouse name: Not on file  . Number of  children: 0  . Years of education: Not on file  . Highest education level: Not on file  Occupational History  . Occupation: Engineer, petroleum, Warehouse manager for resource side  Social Needs  . Financial resource strain: Not on file  . Food insecurity:    Worry: Not on file    Inability: Not on file  . Transportation needs:    Medical: Not on file    Non-medical: Not on file  Tobacco Use  . Smoking status: Never Smoker  . Smokeless tobacco: Never Used  Substance and Sexual Activity  . Alcohol use: No  . Drug use: No  . Sexual activity: Not on file  Lifestyle  . Physical activity:    Days per week: Not on file    Minutes per session: Not on file  . Stress: Not on file  Relationships  . Social connections:    Talks on phone: Not on file    Gets together: Not on file    Attends religious service: Not on file    Active member of club or organization: Not on file    Attends meetings of clubs or organizations: Not on file    Relationship status: Not on file  . Intimate partner violence:    Fear of current or ex partner: Not on file    Emotionally abused: Not on file    Physically abused: Not on file    Forced sexual activity: Not on file  Other Topics Concern  . Not on file  Social History Narrative  . Not on file   Allergies  Allergen Reactions  . Keflex [Cephalexin]     Yeast infections   . Coconut Fatty Acids Itching and Rash   Family History  Problem Relation Age of Onset  . Heart failure Mother   . Hypertension Mother   . Heart disease Mother   . Cancer Other        mother's side of family, breast cancer  . Cancer Other        father's side of family, leukemia  . Hypertension Father   . Stroke Father 76  . Diabetes Other   . Hypertension Other   . Colon cancer Cousin   . Healthy Brother   . Breast cancer Maternal Aunt 53  . Leukemia Paternal Aunt   . Kidney disease Maternal Grandmother   . Hypertension Maternal Grandmother   . Cancer Maternal Grandfather   .  Leukemia Paternal Grandmother   . Cancer Paternal Grandfather     ROS: Per HPI  Objective: Office vital signs reviewed. BP (!) 147/91   Pulse 76   Temp 98.3 F (36.8 C) (Oral)   Ht 5\' 9"  (1.753 m)   Wt (!) 398 lb (180.5 kg)   BMI 58.77 kg/m   Physical Examination:  General: Awake, alert, obese, No acute distress HEENT: Normal    Neck: No masses palpated. No goiter.  Neck girth is enlarged.    Eyes: PERRLA, extraocular movement in tact, sclera white; no exophthalmos  Throat: moist mucus membranes, Airway is patent Cardio: regular rate Pulm:  normal work of breathing on room air MSK: normal gait and station Skin: dry; intact; no rashes or lesions; no acanthosis nigricans observed Psych: Mood stable, speech normal, affect appropriate, good eye contact  Depression screen [facility] 2/9 12/29/2017 12/11/2017 05/14/2017  Decreased Interest 0 0 0  Down, Depressed, Hopeless 0 0 0  PHQ - 2 Score 0 0 0  Altered sleeping 0 - -  Tired, decreased energy 0 - -  Change in appetite 0 - -  Feeling bad or failure about yourself  0 - -  Trouble concentrating 0 - -  Moving slowly or fidgety/restless 0 - -  Suicidal thoughts 0 - -  PHQ-9 Score 0 - -  Difficult doing work/chores Not difficult at all - -    No flowsheet data found.  Assessment/ Plan: 37 y.o. femalehere for initial weight management visit.  1. Morbid obesity ([place]) We spent quite a bit of time discussing lifestyle modification including nutrition during today's visit.  I have advised her to walk every day during lunch time.  She has a sedentary job.  She is currently trying to arrange a trainer.  We identified eating out frequently and lack of meal planning as a main driver of poor dietary choices.  I recommended that she had minimum prepare a healthy breakfast each day since this seems to be what determines how she will do through the rest the day.  I recommended meal planning and to start calorie counting on the lose it app.   Kirke Shaggy was prescribed at last visit but she has yet to pick this up because it needed to be ordered from her pharmacy.  We discussed the side effects of the medication.  She will follow-up in 1 month with me for recheck.  2. Vaginal irritation From her description, unlikely to be a yeast infection or bacterial vaginosis.  She is not experiencing any vaginal discharge and I suspect that the vaginal irritation is likely related to use of douche.  I recommended avoidance of any douche-like products.  I recommended use of lubrication to act as a barrier protectant.  If symptoms persist or worsen, we will plan for pelvic exam.  Total time spent with patient 27 minutes.  Greater than 50% of encounter spent in coordination of care/counseling.    [name], DO [place] [phone]

## 2017-12-29 NOTE — Patient Instructions (Signed)
See me in 1 month for weight recheck.  Use the [employer].  Liraglutide injection (Weight Management) What is this medicine? LIRAGLUTIDE (LIR a GLOO tide) is used with a reduced calorie diet and exercise to help you lose weight. This medicine may be used for other purposes; ask your health care provider or pharmacist if you have questions. COMMON BRAND NAME(S): Saxenda What should I tell my health care provider before I take this medicine? They need to know if you have any of these conditions: -endocrine tumors (MEN 2) or if someone in your family had these tumors -gallbladder disease -high cholesterol -history of alcohol abuse problem -history of pancreatitis -kidney disease or if you are on dialysis -liver disease -previous swelling of the tongue, face, or lips with difficulty breathing, difficulty swallowing, hoarseness, or tightening of the throat -stomach problems -suicidal thoughts, plans, or attempt; a previous suicide attempt by you or a family member -thyroid cancer or if someone in your family had thyroid cancer -an unusual or allergic reaction to liraglutide, other medicines, foods, dyes, or preservatives -pregnant or trying to get pregnant -breast-feeding How should I use this medicine? This medicine is for injection under the skin of your upper leg, stomach area, or upper arm. You will be taught how to prepare and give this medicine. Use exactly as directed. Take your medicine at regular intervals. Do not take it more often than directed. It is important that you put your used needles and syringes in a special sharps container. Do not put them in a trash can. If you do not have a sharps container, call your pharmacist or healthcare provider to get one. A special MedGuide will be given to you by the pharmacist with each prescription and refill. Be sure to read this information carefully each time. Talk to your pediatrician regarding the use of this medicine in children. Special  care may be needed. Overdosage: If you think you have taken too much of this medicine contact a poison control center or emergency room at once. NOTE: This medicine is only for you. Do not share this medicine with others. What if I miss a dose? If you miss a dose, take it as soon as you can. If it is almost time for your next dose, take only that dose. Do not take double or extra doses. If you miss your dose for 3 days or more, call your doctor or health care professional to talk about how to restart this medicine. What may interact with this medicine? -insulin and other medicines for diabetes This list may not describe all possible interactions. Give your health care provider a list of all the medicines, herbs, non-prescription drugs, or dietary supplements you use. Also tell them if you smoke, drink alcohol, or use illegal drugs. Some items may interact with your medicine. What should I watch for while using this medicine? Visit your doctor or health care professional for regular checks on your progress. This medicine is intended to be used in addition to a healthy diet and appropriate exercise. The best results are achieved this way. Do not increase or in any way change your dose without consulting your doctor or health care professional. Drink plenty of fluids while taking this medicine. Check with your doctor or health care professional if you get an attack of severe diarrhea, nausea, and vomiting. The loss of too much body fluid can make it dangerous for you to take this medicine. This medicine may affect blood sugar levels. If you have  diabetes, check with your doctor or health care professional before you change your diet or the dose of your diabetic medicine. Patients and their families should watch out for worsening depression or thoughts of suicide. Also watch out for sudden changes in feelings such as feeling anxious, agitated, panicky, irritable, hostile, aggressive, impulsive, severely  restless, overly excited and hyperactive, or not being able to sleep. If this happens, especially at the beginning of treatment or after a change in dose, call your health care professional. What side effects may I notice from receiving this medicine? Side effects that you should report to your doctor or health care professional as soon as possible: -allergic reactions like skin rash, itching or hives, swelling of the face, lips, or tongue -breathing problems -diarrhea that continues or is severe -lump or swelling on the neck -severe nausea -signs and symptoms of infection like fever or chills; cough; sore throat; pain or trouble passing urine -signs and symptoms of low blood sugar such as feeling anxious, confusion, dizziness, increased hunger, unusually weak or tired, sweating, shakiness, cold, irritable, headache, blurred vision, fast heartbeat, loss of consciousness -signs and symptoms of kidney injury like trouble passing urine or change in the amount of urine -trouble swallowing -unusual stomach upset or pain -vomiting Side effects that usually do not require medical attention (report to your doctor or health care professional if they continue or are bothersome): -constipation -decreased appetite -diarrhea -fatigue -headache -nausea -pain, redness, or irritation at site where injected -stomach upset -stuffy or runny nose This list may not describe all possible side effects. Call your doctor for medical advice about side effects. You may report side effects to FDA at 1-800-FDA-1088. Where should I keep my medicine? Keep out of the reach of children. Store unopened pen in a refrigerator between 2 and 8 degrees C (36 and 46 degrees F). Do not freeze or use if the medicine has been frozen. Protect from light and excessive heat. After you first use the pen, it can be stored at room temperature between 15 and 30 degrees C (59 and 86 degrees F) or in a refrigerator. Throw away your used pen  after 30 days or after the expiration date, whichever comes first. Do not store your pen with the needle attached. If the needle is left on, medicine may leak from the pen. NOTE: This sheet is a summary. It may not cover all possible information. If you have questions about this medicine, talk to your doctor, pharmacist, or health care provider.  2018 Elsevier/Gold Standard (2016-02-29 14:41:37)

## 2017-12-31 ENCOUNTER — Encounter: Payer: Self-pay | Admitting: *Deleted

## 2018-02-02 ENCOUNTER — Encounter: Payer: Self-pay | Admitting: Family Medicine

## 2018-02-02 ENCOUNTER — Ambulatory Visit (INDEPENDENT_AMBULATORY_CARE_PROVIDER_SITE_OTHER): Payer: BLUE CROSS/BLUE SHIELD | Admitting: Family Medicine

## 2018-02-02 VITALS — BP 136/89 | HR 107 | Temp 102.1°F | Ht 69.0 in | Wt 398.0 lb

## 2018-02-02 DIAGNOSIS — J329 Chronic sinusitis, unspecified: Secondary | ICD-10-CM

## 2018-02-02 DIAGNOSIS — R6889 Other general symptoms and signs: Secondary | ICD-10-CM

## 2018-02-02 DIAGNOSIS — R52 Pain, unspecified: Secondary | ICD-10-CM

## 2018-02-02 DIAGNOSIS — J40 Bronchitis, not specified as acute or chronic: Secondary | ICD-10-CM | POA: Diagnosis not present

## 2018-02-02 LAB — VERITOR FLU A/B WAIVED
INFLUENZA B: NEGATIVE
Influenza A: NEGATIVE

## 2018-02-02 MED ORDER — OSELTAMIVIR PHOSPHATE 75 MG PO CAPS: 75.0000 mg | ORAL_CAPSULE | Freq: Two times a day (BID) | ORAL | 0 refills | Status: DC

## 2018-02-02 MED ORDER — KETOROLAC TROMETHAMINE 60 MG/2ML IM SOLN
60.0000 mg | Freq: Once | INTRAMUSCULAR | Status: AC
  Administered 2018-02-02: 60 mg via INTRAMUSCULAR

## 2018-02-02 MED ORDER — AMOXICILLIN-POT CLAVULANATE 875-125 MG PO TABS: 1.0000 | ORAL_TABLET | Freq: Two times a day (BID) | ORAL | 0 refills | Status: DC

## 2018-02-02 NOTE — Progress Notes (Signed)
Chief Complaint  Patient presents with  . flu like    sweats, fever, cough, sneezing x 2 days     HPI  Patient presents today for patient presents with dry cough runny stuffy nose. Diffuse headache of moderate intensity. Patient also has chills and subjective fever. Body aches worst in the back but present in the legs, shoulders, and torso as well. Has sapped the energy to the point that of being unable to perform usual activities other than ADLs. Onset yesterday evening..  Patient is moaning with intense pain rocking back and forth requesting something for the body aches due to the severity of that pain.  Producing green sputum from cough.  Also blowing some green sputum at her nose.  PMH: Smoking status noted ROS: Per HPI  Objective: BP 136/89 (BP Location: Left Wrist)   Pulse (!) 107   Temp (!) 102.1 F (38.9 C) (Oral)   Ht 5\' 9"  (1.753 m)   Wt (!) 398 lb (180.5 kg)   BMI 58.77 kg/m  Gen: NAD, alert, cooperative with exam.  Moaning and swaying back and forth due to pain HEENT: NCAT, EOMI, PERRL CV: RRR, good S1/S2, no murmur Resp: CTABL, no wheezes, non-labored Abd: SNTND, BS present, no guarding or organomegaly Ext: No edema, warm Neuro: Alert and oriented, No gross deficits  Assessment and plan:  1. Flu-like symptoms   2. Generalized body aches   3. Sinobronchitis     Meds ordered this encounter  Medications  . ketorolac (TORADOL) injection 60 mg  . amoxicillin-clavulanate (AUGMENTIN) 875-125 MG tablet    Sig: Take 1 tablet by mouth 2 (two) times daily.    Dispense:  20 tablet    Refill:  0  . oseltamivir (TAMIFLU) 75 MG capsule    Sig: Take 1 capsule (75 mg total) by mouth 2 (two) times daily.    Dispense:  10 capsule    Refill:  0    Orders Placed This Encounter  Procedures  . Veritor Flu A/B Waived    Order Specific Question:   Source    Answer:   nasal    Follow up as needed.  Claretta Fraise, MD

## 2018-02-02 NOTE — Progress Notes (Signed)
toradol 60

## 2018-02-03 ENCOUNTER — Ambulatory Visit: Payer: BLUE CROSS/BLUE SHIELD | Admitting: Family Medicine

## 2018-02-04 ENCOUNTER — Telehealth: Payer: Self-pay | Admitting: Family Medicine

## 2018-02-04 NOTE — Telephone Encounter (Signed)
Pt scheduled with Dr Lajuana Ripple tomorrow at 3.

## 2018-02-04 NOTE — Telephone Encounter (Signed)
Have her follow up in office with Acute provider.

## 2018-02-04 NOTE — Telephone Encounter (Signed)
Pt states she is having stomach cramping at pain scale of 6 every 45 minutes or so lasting 2-3 minutes since Monday. She is taking the Tamiflu and hydrating, with fevers still ranging from 101-102 with the tylenol/ibuprofen. Pt has had some diarrhea and nausea as well. Please advise.

## 2018-02-05 ENCOUNTER — Ambulatory Visit (INDEPENDENT_AMBULATORY_CARE_PROVIDER_SITE_OTHER): Payer: BLUE CROSS/BLUE SHIELD | Admitting: Family Medicine

## 2018-02-05 ENCOUNTER — Encounter: Payer: Self-pay | Admitting: Family Medicine

## 2018-02-05 VITALS — BP 119/74 | HR 93 | Temp 98.0°F | Ht 69.0 in | Wt 399.0 lb

## 2018-02-05 DIAGNOSIS — R6889 Other general symptoms and signs: Secondary | ICD-10-CM | POA: Diagnosis not present

## 2018-02-05 DIAGNOSIS — R110 Nausea: Secondary | ICD-10-CM | POA: Diagnosis not present

## 2018-02-05 DIAGNOSIS — R05 Cough: Secondary | ICD-10-CM | POA: Diagnosis not present

## 2018-02-05 DIAGNOSIS — R059 Cough, unspecified: Secondary | ICD-10-CM

## 2018-02-05 MED ORDER — GUAIFENESIN-CODEINE 100-10 MG/5ML PO SOLN: 5.0000 mL | Freq: Four times a day (QID) | ORAL | 0 refills | Status: DC | PRN

## 2018-02-05 NOTE — Progress Notes (Signed)
Subjective: CC: f/u Flu like symptoms PCP: [name], DO JOI:[id number] Marilyn Rivera is a 37 y.o. female presenting to clinic today for:  1. Flu like symptoms Patient was seen on Monday for flulike symptoms.  She had negative rapid influenza test and was empirically started on Tamiflu.  She was also prescribed Augmentin to cover for any bacterial component.  She notes that since starting the medication she has had significant nausea and diarrhea.  She reports that she has persistent cough, fatigue.  Yesterday was the first day that she was able to get up and out of bed to bathe.  No shortness of breath or wheezing.  She is having difficulty sleeping at night secondary to coughing and congestion.   ROS: Per HPI  Allergies  Allergen Reactions  . Keflex [Cephalexin]     Yeast infections   . Coconut Fatty Acids Itching and Rash   Past Medical History:  Diagnosis Date  . Arthritis   . Diverticulitis large intestine w/o perforation or abscess w/o bleeding 10/05/2014  . Diverticulosis   . GERD (gastroesophageal reflux disease)    history of H Pylori  . Helicobacter pylori gastritis 09/06/2014  . History of kidney stones   . Iron deficiency anemia   . Kidney stones     Current Outpatient Medications:  .  amoxicillin-clavulanate (AUGMENTIN) 875-125 MG tablet, Take 1 tablet by mouth 2 (two) times daily., Disp: 20 tablet, Rfl: 0 .  aspirin EC 325 MG tablet, Take 325 mg by mouth daily., Disp: , Rfl:  .  cyclobenzaprine (FLEXERIL) 5 MG tablet, Take 1 tablet (5 mg total) by mouth 3 (three) times daily as needed for muscle spasms., Disp: 30 tablet, Rfl: 0 .  diclofenac (VOLTAREN) 75 MG EC tablet, Take 1 tablet (75 mg total) by mouth 2 (two) times daily as needed for moderate pain (take w/ food)., Disp: 30 tablet, Rfl: 0 .  fluticasone (FLONASE) 50 MCG/ACT nasal spray, Place 2 sprays into both nostrils daily., Disp: 16 g, Rfl: 6 .  ibuprofen (ADVIL,MOTRIN) 200 MG tablet, Take 400 mg by mouth  daily as needed for headache or moderate pain., Disp: , Rfl:  .  Insulin Pen Needle (BD PEN NEEDLE MICRO U/F) 32G X 6 MM MISC, UAD to inject Saxenda daily., Disp: 100 each, Rfl: 1 .  Liraglutide -Weight Management (SAXENDA) 18 MG/3ML SOPN, Inject 0.6 mg into the skin daily for 28 days, THEN 1.2 mg daily for 28 days., Disp: 3 mL, Rfl: 1 .  montelukast (SINGULAIR) 10 MG tablet, Take 1 tablet (10 mg total) by mouth at bedtime., Disp: 30 tablet, Rfl: 3 .  omeprazole (PRILOSEC) 20 MG capsule, TAKE (1) CAPSULE DAILY BEFORE BREAKFAST., Disp: 90 capsule, Rfl: 3 .  ondansetron (ZOFRAN-ODT) 4 MG disintegrating tablet, Take 1 tablet (4 mg total) by mouth every 6 (six) hours as needed for nausea., Disp: 20 tablet, Rfl: 0 .  oseltamivir (TAMIFLU) 75 MG capsule, Take 1 capsule (75 mg total) by mouth 2 (two) times daily., Disp: 10 capsule, Rfl: 0 Social History   Socioeconomic History  . Marital status: Married    Spouse name: Not on file  . Number of children: 0  . Years of education: Not on file  . Highest education level: Not on file  Occupational History  . Occupation: Engineer, petroleum, Warehouse manager for resource side  Social Needs  . Financial resource strain: Not on file  . Food insecurity:    Worry: Not on file    Inability: Not  on file  . Transportation needs:    Medical: Not on file    Non-medical: Not on file  Tobacco Use  . Smoking status: Never Smoker  . Smokeless tobacco: Never Used  Substance and Sexual Activity  . Alcohol use: No  . Drug use: No  . Sexual activity: Not on file  Lifestyle  . Physical activity:    Days per week: Not on file    Minutes per session: Not on file  . Stress: Not on file  Relationships  . Social connections:    Talks on phone: Not on file    Gets together: Not on file    Attends religious service: Not on file    Active member of club or organization: Not on file    Attends meetings of clubs or organizations: Not on file    Relationship status: Not on file    . Intimate partner violence:    Fear of current or ex partner: Not on file    Emotionally abused: Not on file    Physically abused: Not on file    Forced sexual activity: Not on file  Other Topics Concern  . Not on file  Social History Narrative  . Not on file   Family History  Problem Relation Age of Onset  . Heart failure Mother   . Hypertension Mother   . Heart disease Mother   . Cancer Other        mother's side of family, breast cancer  . Cancer Other        father's side of family, leukemia  . Hypertension Father   . Stroke Father 49  . Diabetes Other   . Hypertension Other   . Colon cancer Cousin   . Healthy Brother   . Breast cancer Maternal Aunt 47  . Leukemia Paternal Aunt   . Kidney disease Maternal Grandmother   . Hypertension Maternal Grandmother   . Cancer Maternal Grandfather   . Leukemia Paternal Grandmother   . Cancer Paternal Grandfather     Objective: Office vital signs reviewed. BP 119/74   Pulse 93   Temp 98 F (36.7 C) (Oral)   Ht 5\' 9"  (1.753 m)   Wt (!) 399 lb (181 kg)   SpO2 98%   BMI 58.92 kg/m   Physical Examination:  General: Awake, alert, nontoxic, No acute distress HEENT: Normal, sclera white, MMM Cardio: regular rate and rhythm, S1S2 heard, no murmurs appreciated Pulm: clear to auscultation bilaterally, no wheezes, rhonchi or rales; normal work of breathing on room air  Assessment/ Plan: 37 y.o. female   1. Flu-like symptoms Patient is afebrile nontoxic-appearing.  We discussed that it is very common to develop nausea and GI symptoms with Tamiflu.  I suspect that the diarrhea she is experiencing is actually related to Augmentin.  Since I see no evidence of bacterial infection on exam, okay to discontinue Augmentin.  I have recommended that she either start a probiotic or eat yogurt in efforts to stabilize GI bacteria.  We also discussed that she could consider using Imodium but if diarrhea is related to viral gastroenteritis  this may prolong course.  Push oral fluids.  Get plenty of rest.  I have extended her note to excuse her through next Thursday.  We discussed reasons for return and emergent evaluation the emergency department.  She voiced good understanding will follow-up PRN.  2. Nausea She has antiemetic at home from previous illness.  Okay to use.  3. Cough Robitussin-AC  prescribed.  We discussed sedative nature of medication.   No orders of the defined types were placed in this encounter.  Meds ordered this encounter  Medications  . guaiFENesin-codeine 100-10 MG/5ML syrup    Sig: Take 5 mLs by mouth every 6 (six) hours as needed for cough.    Dispense:  100 mL    Refill:  0    The Narcotic Database has been reviewed.  There were no red flags.     [name], DO [place] [phone]

## 2018-02-05 NOTE — Patient Instructions (Signed)
STOP the Augmentin.  This is likely causing your diarrhea.  Start a probiotic OR yogurt to stabilize your gut. You may use the nausea medicine if needed.  Make sure that you are getting plenty of fluids. For your cough, I have prescribed you Robitussin with codeine.  This has something to break up the mucus.  The codeine may cause sleepiness so no driving or operating heavy machinery while taking. Use a humidifier to help thin the secretions as well. If you develop any other worrisome symptoms or signs we discussed, please return for reevaluation or seek immediate medical attention emergency department.  It appears that you have a viral upper respiratory infection (cold).  Symptoms can last up to 2 weeks.    - Get plenty of rest and drink plenty of fluids. - Try to breathe moist air. Use a cold mist humidifier. - Consume warm fluids (soup or tea) to provide relief for a stuffy nose and to loosen phlegm. - For nasal stuffiness, try saline nasal spray or a Neti Pot. Afrin nasal spray can also be used but this product should not be used longer than 3 days or it will cause rebound nasal stuffiness (worsening nasal congestion). - For sore throat pain relief: use chloraseptic spray, suck on throat lozenges, hard candy or popsicles; gargle with warm salt water (1/4 tsp. salt per 8 oz. of water); and eat soft, bland foods. - Eat a well-balanced diet. If you cannot, ensure you are getting enough nutrients by taking a daily multivitamin. - Avoid dairy products, as they can thicken phlegm. - Avoid alcohol, as it impairs your body's immune system.  CONTACT YOUR DOCTOR IF YOU EXPERIENCE ANY OF THE FOLLOWING: - High fever - Ear pain - Sinus-type headache - Unusually severe cold symptoms - Cough that gets worse while other cold symptoms improve - Flare up of any chronic lung problem, such as asthma - Your symptoms persist longer than 2 weeks

## 2018-02-09 ENCOUNTER — Other Ambulatory Visit: Payer: Self-pay | Admitting: Family Medicine

## 2018-02-09 ENCOUNTER — Encounter: Payer: Self-pay | Admitting: Family Medicine

## 2018-02-09 MED ORDER — FLUCONAZOLE 150 MG PO TABS: 150.0000 mg | ORAL_TABLET | Freq: Once | ORAL | 0 refills | Status: AC

## 2018-02-10 ENCOUNTER — Encounter: Payer: Self-pay | Admitting: Family Medicine

## 2018-02-28 IMAGING — US US EXTREM LOW VENOUS BILAT
1 series · 13 of 24 positions shown · non-contrast
Comparison: None.

CLINICAL DATA: Bilateral lower extremity edema, left greater than
right. History of morbid obesity. Evaluate for DVT.



[Series 1: us extrem low venous bilat · 0.10mm/px · 13 of 65 slices shown]
[im 1/65]
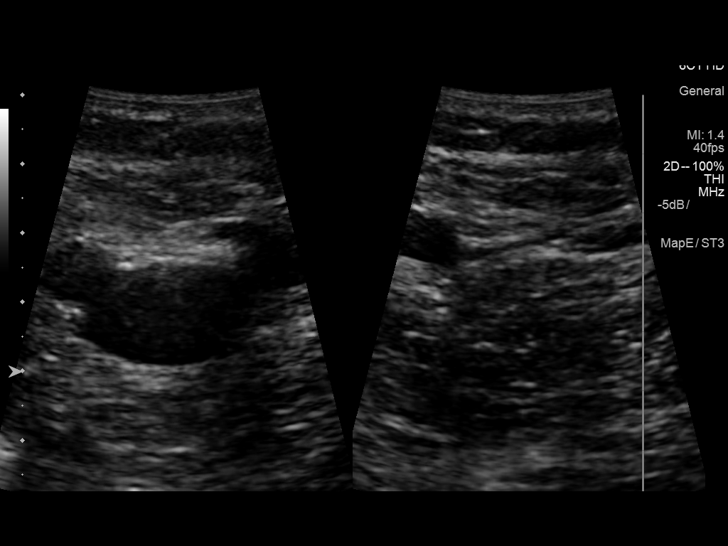
[im 6/65]
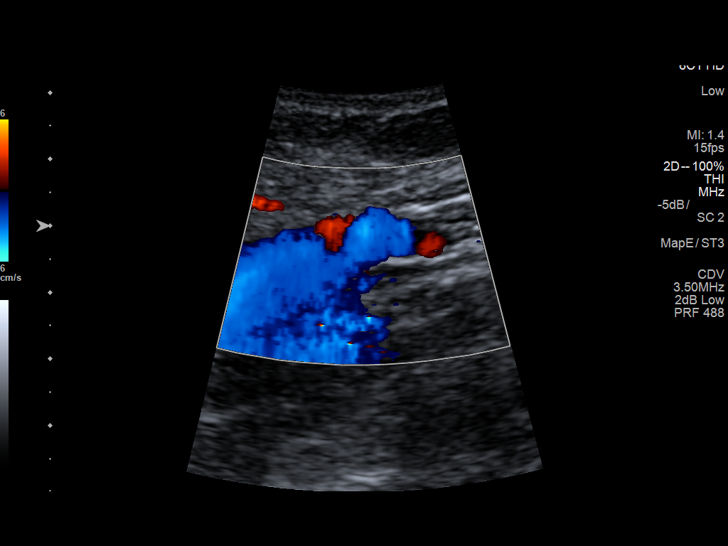
[im 12/65]
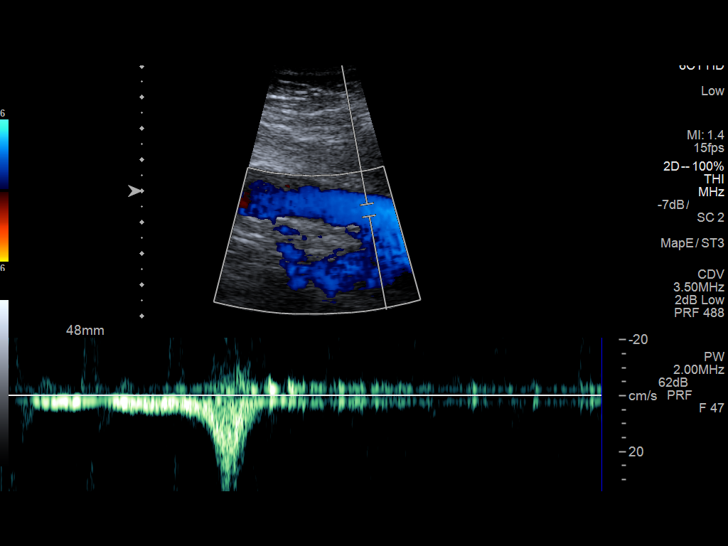
[im 17/65]
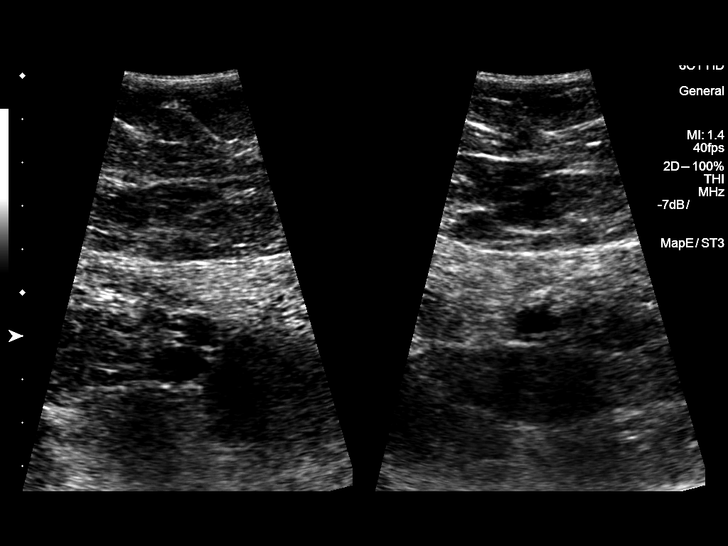
[im 23/65]
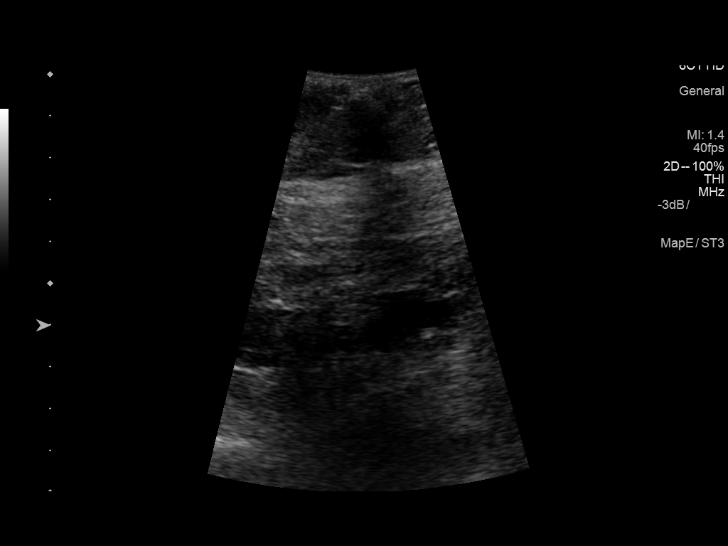
[im 28/65]
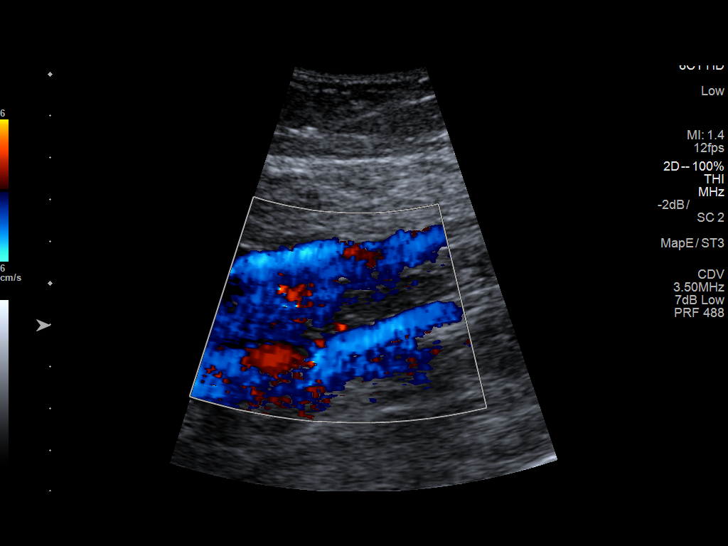
[im 34/65]
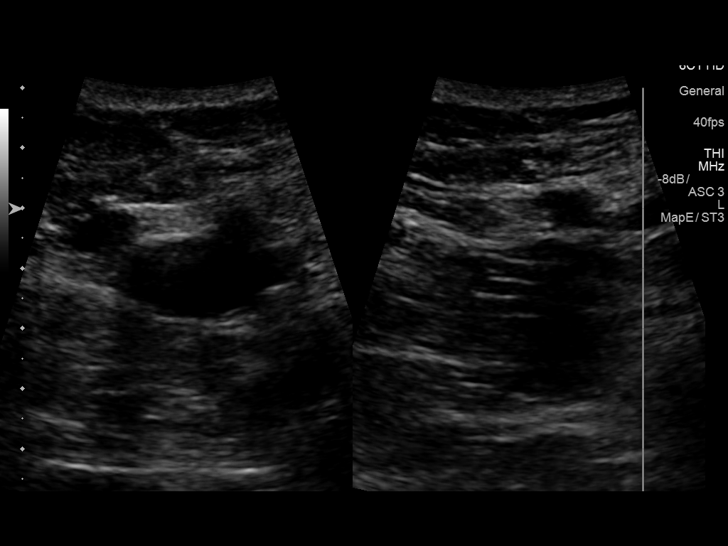
[im 37/65]
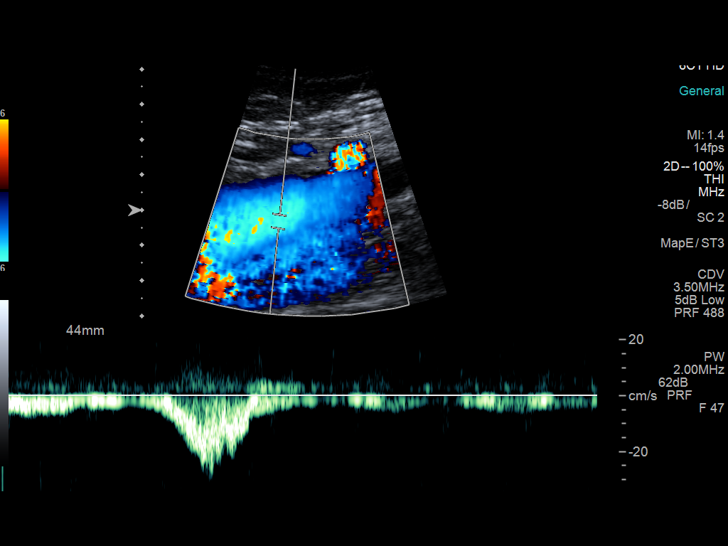
[im 42/65]
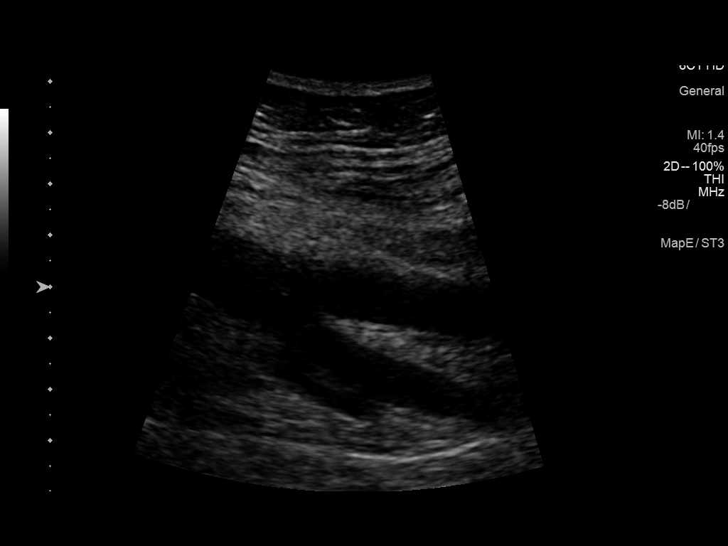
[im 48/65]
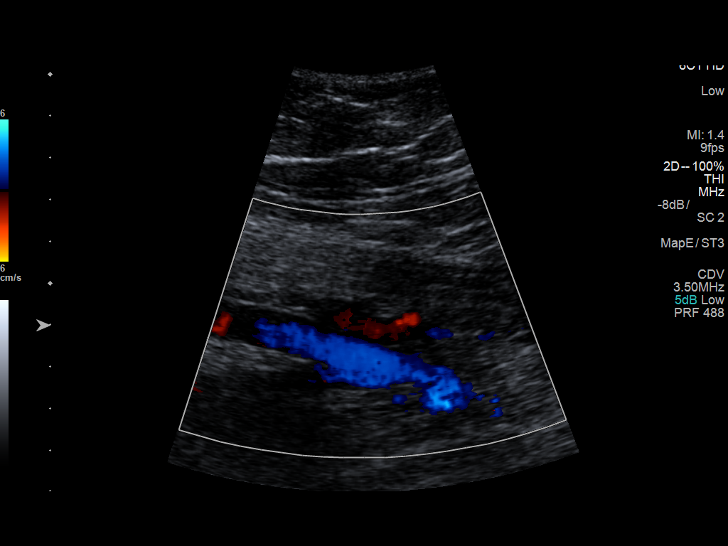
[im 53/65]
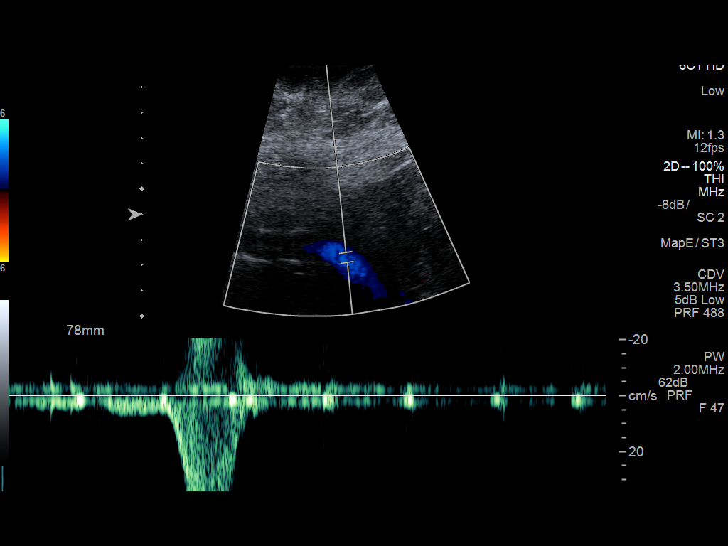
[im 59/65]
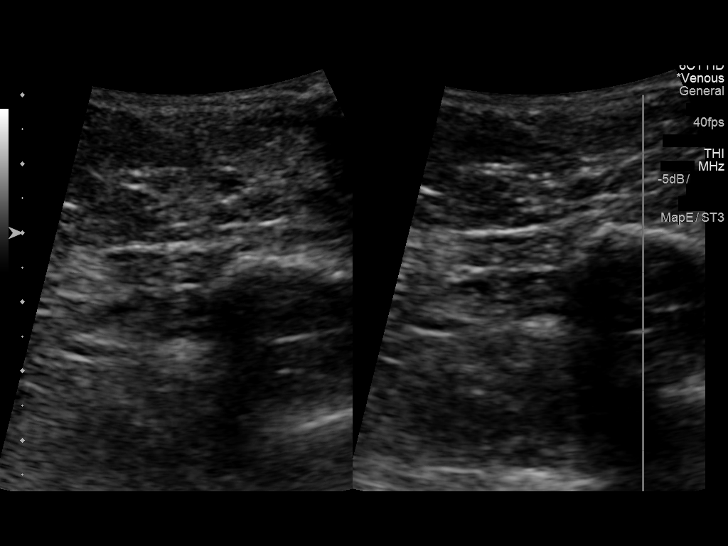
[im 65/65]
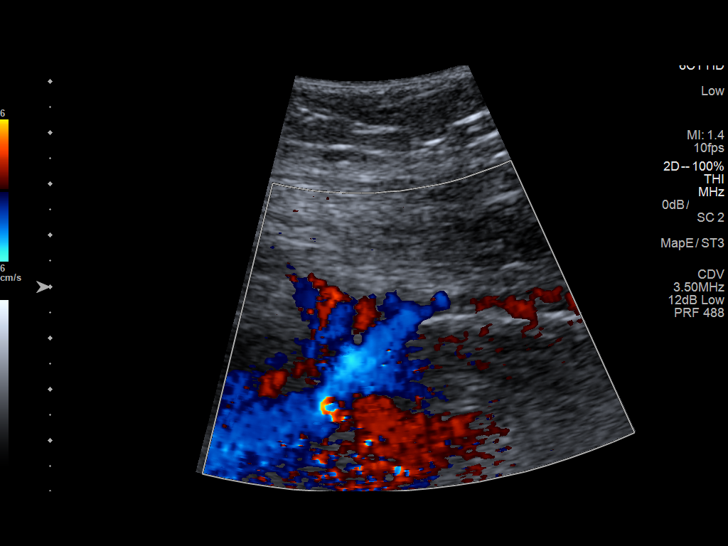

[13 of 24 positions shown; findings below may reference images not displayed]

FINDINGS: RIGHT LOWER EXTREMITY

Common Femoral Vein: No evidence of thrombus. Normal
compressibility, respiratory phasicity and response to augmentation.

Saphenofemoral Junction: No evidence of thrombus. Normal
compressibility and flow on color Doppler imaging.

Profunda Femoral Vein: No evidence of thrombus. Normal
compressibility and flow on color Doppler imaging.

Femoral Vein: No evidence of thrombus. Normal compressibility,
respiratory phasicity and response to augmentation.

Popliteal Vein: No evidence of thrombus. Normal compressibility,
respiratory phasicity and response to augmentation.

Calf Veins: No evidence of thrombus. Normal compressibility and flow
on color Doppler imaging.

Superficial Great Saphenous Vein: No evidence of thrombus. Normal
compressibility.

Venous Reflux:  None.

Other Findings:  None.

LEFT LOWER EXTREMITY

Common Femoral Vein: No evidence of thrombus. Normal
compressibility, respiratory phasicity and response to augmentation.

Saphenofemoral Junction: No evidence of thrombus. Normal
compressibility and flow on color Doppler imaging.

Profunda Femoral Vein: No evidence of thrombus. Normal
compressibility and flow on color Doppler imaging.

Femoral Vein: No evidence of thrombus. Normal compressibility,
respiratory phasicity and response to augmentation.

Popliteal Vein: No evidence of thrombus. Normal compressibility,
respiratory phasicity and response to augmentation.

Calf Veins: No evidence of thrombus. Normal compressibility and flow
on color Doppler imaging.

Superficial Great Saphenous Vein: No evidence of thrombus. Normal
compressibility.

Venous Reflux:  None.

Other Findings:  None.
IMPRESSION: No evidence of DVT within either lower extremity.

## 2018-03-17 ENCOUNTER — Encounter: Payer: Self-pay | Admitting: Family Medicine

## 2018-03-19 ENCOUNTER — Ambulatory Visit (INDEPENDENT_AMBULATORY_CARE_PROVIDER_SITE_OTHER): Payer: BLUE CROSS/BLUE SHIELD | Admitting: Family Medicine

## 2018-03-19 VITALS — BP 157/89 | HR 71 | Temp 97.8°F | Ht 69.0 in | Wt >= 6400 oz

## 2018-03-19 DIAGNOSIS — R1031 Right lower quadrant pain: Secondary | ICD-10-CM | POA: Diagnosis not present

## 2018-03-19 DIAGNOSIS — R3129 Other microscopic hematuria: Secondary | ICD-10-CM

## 2018-03-19 DIAGNOSIS — N912 Amenorrhea, unspecified: Secondary | ICD-10-CM

## 2018-03-19 DIAGNOSIS — Z87442 Personal history of urinary calculi: Secondary | ICD-10-CM

## 2018-03-19 LAB — URINALYSIS
Bilirubin, UA: NEGATIVE
GLUCOSE, UA: NEGATIVE
KETONES UA: NEGATIVE
Leukocytes, UA: NEGATIVE
Nitrite, UA: NEGATIVE
PROTEIN UA: NEGATIVE
Specific Gravity, UA: >1.03 — ABNORMAL HIGH (ref 1.005–1.030)
UUROB: 0.2 mg/dL (ref 0.2–1.0)
pH, UA: 6 (ref 5.0–7.5)

## 2018-03-19 LAB — PREGNANCY, URINE: Preg Test, Ur: NEGATIVE

## 2018-03-19 LAB — BAYER DCA HB A1C WAIVED: HB A1C: 6 % (ref <7.0)

## 2018-03-19 MED ORDER — DICLOFENAC SODIUM 75 MG PO TBEC: 75.0000 mg | DELAYED_RELEASE_TABLET | Freq: Two times a day (BID) | ORAL | 0 refills | Status: DC

## 2018-03-19 MED ORDER — TAMSULOSIN HCL 0.4 MG PO CAPS: 0.4000 mg | ORAL_CAPSULE | Freq: Every day | ORAL | 3 refills | Status: DC

## 2018-03-19 NOTE — Patient Instructions (Signed)
You had labs performed today.  You will be contacted with the results of the labs once they are available, usually in the next 3 business days for routine lab work.   I think this is a stone.  You had blood in your urine. I have ordered a CT scan.  You will be called to schedule.  I ordered Flomax and Diclofenac.  You have prescribed a nonsteroidal anti-inflammatory drug (NSAID) today. This will help with your pain and inflammation. Please do not take any other NSAIDs (ibuprofen/Motrin/Advil, naproxen/Aleve, meloxicam/Mobic, Voltaren/diclofenac). Please make sure to eat a meal when taking this medication.   Caution:  If you have a history of acid reflux/indigestion, I recommend that you take an antacid (such as Prilosec, Prevacid) daily while on the NSAID.  If you have a history of bleeding disorder, gastric ulcer, are on a blood thinner (like warfarin/Coumadin, Xarelto, Eliquis, etc) please do not take NSAID.  If you have ever had a heart attack, you should not take NSAIDs.  Bladder Stone  A bladder stone is a buildup of crystals made from the proteins and minerals found in urine. These substances build up when your urine becomes too concentrated. Bladder stones usually develop when you have another medical condition that prevents your bladder from emptying completely. Crystals can form in the small amount of urine left in your bladder. Bladder stones that grow large can become painful and block the flow of urine. What are the causes? Bladder stones can be caused by:  An enlarged prostate, which prevents the bladder from emptying well.  A urinary tract infection (UTI).  A weak spot in the bladder that creates a small pouch (bladder diverticulum).  Nerve damage that may interfere with the messages from your brain to your bladder muscles (neurogenic bladder). This can result from conditions such as Parkinson disease or spinal cord injuries. What increases the risk? This condition is more  likely to develop in people who:  Get frequent UTIs.  Have another medical condition that affects their bladder.  Have a history of bladder surgery.  Have a spinal cord injury.  Have an abnormally shaped bladder (deformity). What are the signs or symptoms? Small bladder stones do not always cause symptoms. Larger stones can cause symptoms that include:  Abdominal pain.  A frequent need to urinate.  Difficulty urinating.  Painful urination.  Blood in the urine.  Cloudy or dark colored urine.  Pain in the penis or testicles for men. How is this diagnosed? This condition is diagnosed based on your symptoms, medical history, and a physical exam. The exam will include checking for abdominal tenderness. For men, a rectal exam may be done to check the prostate gland. You may also have other tests, such as:  A urine test (urinalysis) to find out more about your condition.  A urine sample test to check for other infections (culture).  Blood tests, including tests to look for a substance called creatinine. A creatinine level that is higher than normal could indicate a blockage.  A procedure to examine the inside of your bladder using a thin scope with a tiny lighted camera (cystoscopy) inserted through the urethra. You may also have imaging studies such as:  A CT scan of your abdomen and pelvis to look for a stone and check whether it is blocking the flow of urine.  An X-ray of your kidneys, ureters, bladder, and urethra after you have a type of dye (contrast material) injected into your veins (intravenous pyelogram or  IVP).  An abdominal and pelvic ultrasound to locate bladder stones and identify areas where urine flow is blocked. How is this treated?  Small bladder stones do not require treatment. They can pass out of your body on their own. You may be instructed to drink extra water to help the stone pass through the bladder. Larger stones may need to be removed with one of  the following procedures:  Cystolitholapaxy. A cystoscope is inserted through the urethra and into the bladder to view the stone. A laser, ultrasound, or other device is used to break the stone into smaller pieces. Fluids are used to flush the small pieces from the area.  Surgical removal. You may need surgery to remove the stone if it is large and causing pain. A small incision is made in the bladder to directly remove the stone.  If the stone blocks the flow of urine, you may have a thin, flexible tube (stent) threaded into your ureter. The stent may be left in place after removal of a stone to ensure flow of urine until healing is complete. Follow these instructions at home:  Drink enough fluid to keep your urine pale yellow.  Report unusual urinary symptoms to your health care provider. Early diagnosis of an enlarged prostate and other bladder conditions may reduce your chance of getting bladder stones.  Avoid smoking and illegal drug use. Contact a health care provider if:  You have a fever.  You feel nauseous or vomit.  You are unable to urinate.  You have a large amount of blood in your urine. Get help right away if:  You have severe back pain or lower abdominal pain.  You are vomiting and cannot keep down any medicines or water. This information is not intended to replace advice given to you by your health care provider. Make sure you discuss any questions you have with your health care provider. Document Released: 02/26/2015 Document Revised: 02/21/2017 Document Reviewed: 02/26/2015 Elsevier Interactive Patient Education  2019 [employer].

## 2018-03-19 NOTE — Progress Notes (Signed)
Subjective: CC: Right lower quadrant PCP: [name], DO WCB:JSE[facility] Marilyn Rivera is a 38 y.o. female presenting to clinic today for:  1.  Pain right lower quadrant pain Patient reports onset of right lower quadrant pain earlier this week.  She notes that she had abrupt onset of pain and thought that perhaps she needed to use the restroom.  She subsequently could not really pass stool despite straining.  When she got up to go to her desk, she felt the same symptom and then return to the bathroom and ultimately had a large bowel movement.  She denies any hematochezia or melena.  No associated nausea, vomiting.  Denies any abnormal color or smell to the urine but does note one episode of dysuria that resolved.  She is not had any urinary frequency, urgency or new onset back pain.  She notes that she took ibuprofen which did seem to take the edge off but has not resolved the pain.  She has not had any fevers, decreased appetite.  She has not had a period in 2 months and is sexually active.  Past medical history is significant for renal stones that required surgical removal in the past.   ROS: Per HPI  Allergies  Allergen Reactions  . Keflex [Cephalexin]     Yeast infections   . Coconut Fatty Acids Itching and Rash   Past Medical History:  Diagnosis Date  . Arthritis   . Diverticulitis large intestine w/o perforation or abscess w/o bleeding 10/05/2014  . Diverticulosis   . GERD (gastroesophageal reflux disease)    history of H Pylori  . Helicobacter pylori gastritis 09/06/2014  . History of kidney stones   . Iron deficiency anemia   . Kidney stones     Current Outpatient Medications:  .  aspirin EC 325 MG tablet, Take 325 mg by mouth daily., Disp: , Rfl:  .  fluticasone (FLONASE) 50 MCG/ACT nasal spray, Place 2 sprays into both nostrils daily., Disp: 16 g, Rfl: 6 .  ibuprofen (ADVIL,MOTRIN) 200 MG tablet, Take 400 mg by mouth daily as needed for headache or moderate pain., Disp: ,  Rfl:  .  Insulin Pen Needle (BD PEN NEEDLE MICRO U/F) 32G X 6 MM MISC, UAD to inject Saxenda daily., Disp: 100 each, Rfl: 1 .  montelukast (SINGULAIR) 10 MG tablet, Take 1 tablet (10 mg total) by mouth at bedtime., Disp: 30 tablet, Rfl: 3 .  omeprazole (PRILOSEC) 20 MG capsule, TAKE (1) CAPSULE DAILY BEFORE BREAKFAST., Disp: 90 capsule, Rfl: 3 .  cyclobenzaprine (FLEXERIL) 5 MG tablet, Take 1 tablet (5 mg total) by mouth 3 (three) times daily as needed for muscle spasms. (Patient not taking: Reported on 03/19/2018), Disp: 30 tablet, Rfl: 0 .  diclofenac (VOLTAREN) 75 MG EC tablet, Take 1 tablet (75 mg total) by mouth 2 (two) times daily as needed for moderate pain (take w/ food). (Patient not taking: Reported on 03/19/2018), Disp: 30 tablet, Rfl: 0 .  guaiFENesin-codeine 100-10 MG/5ML syrup, Take 5 mLs by mouth every 6 (six) hours as needed for cough. (Patient not taking: Reported on 03/19/2018), Disp: 100 mL, Rfl: 0 .  ondansetron (ZOFRAN-ODT) 4 MG disintegrating tablet, Take 1 tablet (4 mg total) by mouth every 6 (six) hours as needed for nausea. (Patient not taking: Reported on 03/19/2018), Disp: 20 tablet, Rfl: 0 .  oseltamivir (TAMIFLU) 75 MG capsule, Take 1 capsule (75 mg total) by mouth 2 (two) times daily. (Patient not taking: Reported on 03/19/2018), Disp: 10  capsule, Rfl: 0 Social History   Socioeconomic History  . Marital status: Married    Spouse name: Not on file  . Number of children: 0  . Years of education: Not on file  . Highest education level: Not on file  Occupational History  . Occupation: Engineer, petroleum, Warehouse manager for resource side  Social Needs  . Financial resource strain: Not on file  . Food insecurity:    Worry: Not on file    Inability: Not on file  . Transportation needs:    Medical: Not on file    Non-medical: Not on file  Tobacco Use  . Smoking status: Never Smoker  . Smokeless tobacco: Never Used  Substance and Sexual Activity  . Alcohol use: No  . Drug use: No    . Sexual activity: Not on file  Lifestyle  . Physical activity:    Days per week: Not on file    Minutes per session: Not on file  . Stress: Not on file  Relationships  . Social connections:    Talks on phone: Not on file    Gets together: Not on file    Attends religious service: Not on file    Active member of club or organization: Not on file    Attends meetings of clubs or organizations: Not on file    Relationship status: Not on file  . Intimate partner violence:    Fear of current or ex partner: Not on file    Emotionally abused: Not on file    Physically abused: Not on file    Forced sexual activity: Not on file  Other Topics Concern  . Not on file  Social History Narrative  . Not on file   Family History  Problem Relation Age of Onset  . Heart failure Mother   . Hypertension Mother   . Heart disease Mother   . Cancer Other        mother's side of family, breast cancer  . Cancer Other        father's side of family, leukemia  . Hypertension Father   . Stroke Father 43  . Diabetes Other   . Hypertension Other   . Colon cancer Cousin   . Healthy Brother   . Breast cancer Maternal Aunt 43  . Leukemia Paternal Aunt   . Kidney disease Maternal Grandmother   . Hypertension Maternal Grandmother   . Cancer Maternal Grandfather   . Leukemia Paternal Grandmother   . Cancer Paternal Grandfather     Objective: Office vital signs reviewed. BP (!) 157/89   Pulse 71   Temp 97.8 F (36.6 C) (Oral)   Ht 5\' 9"  (1.753 m)   Wt (!) 400 lb (181.4 kg)   BMI 59.07 kg/m   Physical Examination:  General: Awake, alert, obese, No acute distress HEENT: Normal, sclera white, MMM GI: obese. soft, mild TTP to RLQ (more within the pelvis).  Negative Murphy's.  Non-distended, bowel sounds present x4, no hepatomegaly, no splenomegaly, no masses GU: no suprapubic TTP, no CVA TTP  Results for orders placed or performed in visit on 03/19/18 (from the past 24 hour(s))  Urinalysis      Status: Abnormal   Collection Time: 03/19/18  6:20 PM  Result Value Ref Range   Specific Gravity, UA >1.030 (H) 1.005 - 1.030   pH, UA 6.0 5.0 - 7.5   Color, UA Yellow Yellow   Appearance Ur Clear Clear   Leukocytes, UA Negative Negative   Protein,  UA Negative Negative/Trace   Glucose, UA Negative Negative   Ketones, UA Negative Negative   RBC, UA 2+ (A) Negative   Bilirubin, UA Negative Negative   Urobilinogen, Ur 0.2 0.2 - 1.0 mg/dL   Nitrite, UA Negative Negative   Narrative   Performed at:  [facility] [address] [place], [place]  [id number] Lab Director: Colletta Maryland [facility], Phone:  [phone]  Pregnancy, urine     Status: None   Collection Time: 03/19/18  6:20 PM  Result Value Ref Range   Preg Test, Ur Negative Negative   Narrative   Performed at:  [facility] [address] [place], [place]  [id number] Lab Director: Colletta Maryland [facility], Phone:  [phone]   Assessment/ Plan: 38 y.o. female   1. Acute right lower quadrant pain I have a high suspicion for renal versus bladder stone given microscopic hematuria and vague association with dysuria and history of renal stone in the past.  Urine pregnancy was negative.  Differentials considered included ectopic pregnancy (unlikely given negative urine pregnancy) and acute appendicitis but I find acute appendicitis to be less likely at this time given the lack of fever, vomiting and improving symptoms with oral NSAID.  I have ordered a BMP, CBC and urine culture.  I have also ordered CT renal stone study.  In the interim, will place on Flomax and diclofenac.  We discussed reasons for emergent evaluation emergency department.  Patient was good understanding. - Urinalysis - Pregnancy, urine - Basic Metabolic Panel - CBC - Urine Culture - CT RENAL STONE STUDY; Future  2. Amenorrhea - Pregnancy, urine  3. Morbid obesity ([place]) - Bayer DCA Hb A1c Waived  4. Other microscopic hematuria - Urine  Culture - CT RENAL STONE STUDY; Future  5. History of renal stone - CT RENAL STONE STUDY; Future   Orders Placed This Encounter  Procedures  . Urine Culture  . CT RENAL STONE STUDY    Standing Status:   Future    Standing Expiration Date:   06/18/2019    Order Specific Question:   ** REASON FOR EXAM (FREE TEXT)    Answer:   1 week history of Right lower quadrant pain w/ hematuria and history of renal stone that required surgical removal.    Order Specific Question:   Is patient pregnant?    Answer:   No    Order Specific Question:   Preferred imaging location?    Answer:   [facility]    Order Specific Question:   Radiology Contrast Protocol - do NOT remove file path    Answer:   \\[email or web address]  . Urinalysis  . Pregnancy, urine  . Bayer DCA Hb A1c Waived  . Basic Metabolic Panel  . CBC   Meds ordered this encounter  Medications  . tamsulosin (FLOMAX) 0.4 MG CAPS capsule    Sig: Take 1 capsule (0.4 mg total) by mouth daily.    Dispense:  30 capsule    Refill:  3  . diclofenac (VOLTAREN) 75 MG EC tablet    Sig: Take 1 tablet (75 mg total) by mouth 2 (two) times daily.    Dispense:  30 tablet    Refill:  [place], DO [place] [phone]

## 2018-03-21 LAB — CBC
Hematocrit: 36.7 % (ref 34.0–46.6)
Hemoglobin: 11.1 g/dL (ref 11.1–15.9)
MCH: 23.2 pg — AB (ref 26.6–33.0)
MCHC: 30.2 g/dL — AB (ref 31.5–35.7)
MCV: 77 fL — ABNORMAL LOW (ref 79–97)
Platelets: 280 10*3/uL (ref 150–450)
RBC: 4.78 x10E6/uL (ref 3.77–5.28)
RDW: 18.7 % — ABNORMAL HIGH (ref 11.7–15.4)
WBC: 8.7 10*3/uL (ref 3.4–10.8)

## 2018-03-21 LAB — BASIC METABOLIC PANEL
BUN/Creatinine Ratio: 11 (ref 9–23)
BUN: 10 mg/dL (ref 6–20)
CO2: 19 mmol/L — AB (ref 20–29)
Calcium: 8.7 mg/dL (ref 8.7–10.2)
Chloride: 109 mmol/L — ABNORMAL HIGH (ref 96–106)
Creatinine, Ser: 0.92 mg/dL (ref 0.57–1.00)
GFR calc Af Amer: 91 mL/min/{1.73_m2} (ref >59)
GFR, EST NON AFRICAN AMERICAN: 79 mL/min/{1.73_m2} (ref >59)
GLUCOSE: 100 mg/dL — AB (ref 65–99)
Potassium: 4.3 mmol/L (ref 3.5–5.2)
Sodium: 144 mmol/L (ref 134–144)

## 2018-03-21 LAB — URINE CULTURE

## 2018-04-17 ENCOUNTER — Ambulatory Visit: Payer: BLUE CROSS/BLUE SHIELD | Admitting: Physician Assistant

## 2018-04-17 VITALS — BP 133/82 | HR 73 | Temp 97.3°F | Ht 69.0 in

## 2018-04-17 DIAGNOSIS — L0291 Cutaneous abscess, unspecified: Secondary | ICD-10-CM | POA: Diagnosis not present

## 2018-04-17 MED ORDER — CLINDAMYCIN HCL 300 MG PO CAPS: 300.0000 mg | ORAL_CAPSULE | Freq: Three times a day (TID) | ORAL | 1 refills | Status: DC

## 2018-04-19 ENCOUNTER — Encounter: Payer: Self-pay | Admitting: Physician Assistant

## 2018-04-19 NOTE — Progress Notes (Signed)
BP 133/82   Pulse 73   Temp (!) 97.3 F (36.3 C) (Oral)   Ht 5\' 9"  (1.753 m)   BMI 59.07 kg/m    Subjective:    Patient ID: Marilyn Rivera, female    DOB: [birth year], 38 y.o.   MRN: [id number]  HPI: Marilyn Rivera is a 38 y.o. female presenting on 04/17/2018 for boil on left side of back  She has 2 areas on her back and one on the lower abdomen. She has been having them for about 1 week. The one on the abdomen has opened and drained, reports that it smells bad.  She had some many years ago. No hidradenitis history.  Past Medical History:  Diagnosis Date  . Arthritis   . Diverticulitis large intestine w/o perforation or abscess w/o bleeding 10/05/2014  . Diverticulosis   . GERD (gastroesophageal reflux disease)    history of H Pylori  . Helicobacter pylori gastritis 09/06/2014  . History of kidney stones   . Iron deficiency anemia   . Kidney stones    Relevant past medical, surgical, family and social history reviewed and updated as indicated. Interim medical history since our last visit reviewed. Allergies and medications reviewed and updated. DATA REVIEWED: CHART IN EPIC  Family History reviewed for pertinent findings.  Review of Systems  Constitutional: Negative.   HENT: Negative.   Eyes: Negative.   Respiratory: Negative.   Gastrointestinal: Negative.   Genitourinary: Negative.   Skin: Positive for color change.    Allergies as of 04/17/2018      Reactions   Keflex [cephalexin]    Yeast infections    Coconut Fatty Acids Itching, Rash      Medication List       Accurate as of April 17, 2018 11:59 PM. Always use your most recent med list.        aspirin EC 325 MG tablet Take 325 mg by mouth daily.   clindamycin 300 MG capsule Commonly known as:  CLEOCIN Take 1 capsule (300 mg total) by mouth 3 (three) times daily.   cyclobenzaprine 5 MG tablet Commonly known as:  FLEXERIL Take 1 tablet (5 mg total) by mouth 3 (three) times daily as needed for  muscle spasms.   diclofenac 75 MG EC tablet Commonly known as:  VOLTAREN Take 1 tablet (75 mg total) by mouth 2 (two) times daily.   fluticasone 50 MCG/ACT nasal spray Commonly known as:  FLONASE Place 2 sprays into both nostrils daily.   guaiFENesin-codeine 100-10 MG/5ML syrup Take 5 mLs by mouth every 6 (six) hours as needed for cough.   ibuprofen 200 MG tablet Commonly known as:  ADVIL,MOTRIN Take 400 mg by mouth daily as needed for headache or moderate pain.   Insulin Pen Needle 32G X 6 MM Misc Commonly known as:  BD PEN NEEDLE MICRO U/F UAD to inject Saxenda daily.   montelukast 10 MG tablet Commonly known as:  SINGULAIR Take 1 tablet (10 mg total) by mouth at bedtime.   omeprazole 20 MG capsule Commonly known as:  PRILOSEC TAKE (1) CAPSULE DAILY BEFORE BREAKFAST.   ondansetron 4 MG disintegrating tablet Commonly known as:  ZOFRAN-ODT Take 1 tablet (4 mg total) by mouth every 6 (six) hours as needed for nausea.   oseltamivir 75 MG capsule Commonly known as:  TAMIFLU Take 1 capsule (75 mg total) by mouth 2 (two) times daily.   tamsulosin 0.4 MG Caps capsule Commonly known as:  FLOMAX Take 1 capsule (  0.4 mg total) by mouth daily.          Objective:    BP 133/82   Pulse 73   Temp (!) 97.3 F (36.3 C) (Oral)   Ht 5\' 9"  (1.753 m)   BMI 59.07 kg/m   Allergies  Allergen Reactions  . Keflex [Cephalexin]     Yeast infections   . Coconut Fatty Acids Itching and Rash    Wt Readings from Last 3 Encounters:  03/19/18 (!) 400 lb (181.4 kg)  02/05/18 (!) 399 lb (181 kg)  02/02/18 (!) 398 lb (180.5 kg)    Physical Exam Constitutional:      Appearance: She is well-developed.  HENT:     Head: Normocephalic and atraumatic.  Eyes:     Conjunctiva/sclera: Conjunctivae normal.     Pupils: Pupils are equal, round, and reactive to light.  Cardiovascular:     Rate and Rhythm: Normal rate and regular rhythm.     Heart sounds: Normal heart sounds.  Pulmonary:       Effort: Pulmonary effort is normal.     Breath sounds: Normal breath sounds.  Abdominal:     General: Bowel sounds are normal.     Palpations: Abdomen is soft.  Skin:    General: Skin is warm and dry.     Findings: Abscess and erythema present. No rash.       Neurological:     Mental Status: She is alert and oriented to person, place, and time.     Deep Tendon Reflexes: Reflexes are normal and symmetric.  Psychiatric:        Behavior: Behavior normal.        Thought Content: Thought content normal.        Judgment: Judgment normal.     Results for orders placed or performed in visit on 03/19/18  Urine Culture  Result Value Ref Range   Urine Culture, Routine Final report    Organism ID, Bacteria Comment   Urinalysis  Result Value Ref Range   Specific Gravity, UA >1.030 (H) 1.005 - 1.030   pH, UA 6.0 5.0 - 7.5   Color, UA Yellow Yellow   Appearance Ur Clear Clear   Leukocytes, UA Negative Negative   Protein, UA Negative Negative/Trace   Glucose, UA Negative Negative   Ketones, UA Negative Negative   RBC, UA 2+ (A) Negative   Bilirubin, UA Negative Negative   Urobilinogen, Ur 0.2 0.2 - 1.0 mg/dL   Nitrite, UA Negative Negative  Pregnancy, urine  Result Value Ref Range   Preg Test, Ur Negative Negative  Bayer DCA Hb A1c Waived  Result Value Ref Range   HB A1C (BAYER DCA - WAIVED) 6.0 <1.6 %  Basic Metabolic Panel  Result Value Ref Range   Glucose 100 (H) 65 - 99 mg/dL   BUN 10 6 - 20 mg/dL   Creatinine, Ser 0.92 0.57 - 1.00 mg/dL   GFR calc non Af Amer 79 >59 mL/min/1.73   GFR calc Af Amer 91 >59 mL/min/1.73   BUN/Creatinine Ratio 11 9 - 23   Sodium 144 134 - 144 mmol/L   Potassium 4.3 3.5 - 5.2 mmol/L   Chloride 109 (H) 96 - 106 mmol/L   CO2 19 (L) 20 - 29 mmol/L   Calcium 8.7 8.7 - 10.2 mg/dL  CBC  Result Value Ref Range   WBC 8.7 3.4 - 10.8 x10E3/uL   RBC 4.78 3.77 - 5.28 x10E6/uL   Hemoglobin 11.1 11.1 - 15.9  g/dL   Hematocrit 36.7 34.0 - 46.6 %    MCV 77 (L) 79 - 97 fL   MCH 23.2 (L) 26.6 - 33.0 pg   MCHC 30.2 (L) 31.5 - 35.7 g/dL   RDW 18.7 (H) 11.7 - 15.4 %   Platelets 280 150 - 450 x10E3/uL      Assessment & Plan:   1. Abscess - clindamycin (CLEOCIN) 300 MG capsule; Take 1 capsule (300 mg total) by mouth 3 (three) times daily.  Dispense: 30 capsule; Refill: 1   Continue all other maintenance medications as listed above.  Follow up plan: No follow-ups on file.  Educational handout given for [place] PA-C [facility] [address]  [place], [place] [address] [phone]   04/19/2018, 9:18 PM

## 2018-04-20 ENCOUNTER — Telehealth: Payer: Self-pay | Admitting: Family Medicine

## 2018-04-21 ENCOUNTER — Other Ambulatory Visit: Payer: Self-pay | Admitting: Physician Assistant

## 2018-04-21 MED ORDER — SULFAMETHOXAZOLE-TRIMETHOPRIM 800-160 MG PO TABS: 1.0000 | ORAL_TABLET | Freq: Two times a day (BID) | ORAL | 0 refills | Status: DC

## 2018-04-21 NOTE — Telephone Encounter (Signed)
Can abx be changed or does patient need to come back in to be seen? Please advise and send to pools

## 2018-04-21 NOTE — Telephone Encounter (Signed)
cc'ing to [place] provider

## 2018-04-21 NOTE — Telephone Encounter (Signed)
Patient aware and verbalizes understanding. 

## 2018-04-21 NOTE — Telephone Encounter (Signed)
I have sent bactrim DS for 10 days to be ADDED to the CLINDAMYCIN. Went to [facility], if no better, needs to be seen.

## 2018-05-14 ENCOUNTER — Encounter: Payer: Self-pay | Admitting: Family Medicine

## 2018-05-14 ENCOUNTER — Ambulatory Visit: Payer: Self-pay | Admitting: Gastroenterology

## 2018-05-18 ENCOUNTER — Encounter: Payer: Self-pay | Admitting: Family Medicine

## 2018-05-18 ENCOUNTER — Other Ambulatory Visit: Payer: Self-pay

## 2018-05-18 ENCOUNTER — Ambulatory Visit: Payer: BLUE CROSS/BLUE SHIELD | Admitting: Family Medicine

## 2018-05-18 VITALS — BP 146/85 | HR 69 | Temp 98.7°F | Ht 69.0 in | Wt >= 6400 oz

## 2018-05-18 DIAGNOSIS — M25561 Pain in right knee: Secondary | ICD-10-CM | POA: Diagnosis not present

## 2018-05-18 DIAGNOSIS — G8929 Other chronic pain: Secondary | ICD-10-CM | POA: Diagnosis not present

## 2018-05-18 DIAGNOSIS — H539 Unspecified visual disturbance: Secondary | ICD-10-CM

## 2018-05-18 MED ORDER — METHYLPREDNISOLONE ACETATE 40 MG/ML IJ SUSP
40.0000 mg | Freq: Once | INTRAMUSCULAR | Status: AC
  Administered 2018-05-18: 40 mg via INTRA_ARTICULAR

## 2018-05-18 NOTE — Progress Notes (Signed)
Subjective: CC: transient vision loss PCP: [name], DO GUR:[facility] Marilyn Rivera is a 38 y.o. female presenting to clinic today for:  1. Vision loss Patient reports a 5-6 month history of intermittent right sided vision loss.  She notes that the first episode started about 6 months ago and lasted only 2 to 3 minutes.  This occurred after waking and resolved after rubbing her eye.  She does report a total loss of vision in that right eye.  About a month and a half ago it happened again but lasted about 5 minutes.  This occurred again recently and lasted for 10 minutes.  She has noticed some changes in vision.  She has had some intermittent headache.  She occasionally gets sharp pains in the right side of her scalp.  Denies any facial sensitivity, pain with eye movement, recent trauma to the eye but she does note history of trauma to the face in the past, or any other neurologic changes.  She tried to see an eye doctor but unfortunately because of the pandemic she is has not been able to secure an appointment with anybody.  2.  Chronic right knee pain Patient with longstanding history of right knee pain.  She had history of arthroscopy performed many years ago.  She was getting interval corticosteroid injections with [facility] orthopedics but started coming to me for interval injections last year.  Last corticosteroid injection was April 2018.  She reports current knee pain flare started over 1 month ago but she was unaware that she could get another corticosteroid injection.  She reports some swelling and increased pain with ambulation.  No sensation changes.   ROS: Per HPI  Allergies  Allergen Reactions  . Keflex [Cephalexin]     Yeast infections   . Coconut Fatty Acids Itching and Rash   Past Medical History:  Diagnosis Date  . Arthritis   . Diverticulitis large intestine w/o perforation or abscess w/o bleeding 10/05/2014  . Diverticulosis   . GERD (gastroesophageal reflux disease)     history of H Pylori  . Helicobacter pylori gastritis 09/06/2014  . History of kidney stones   . Iron deficiency anemia   . Kidney stones     Current Outpatient Medications:  .  aspirin EC 325 MG tablet, Take 325 mg by mouth daily., Disp: , Rfl:  .  cyclobenzaprine (FLEXERIL) 5 MG tablet, Take 1 tablet (5 mg total) by mouth 3 (three) times daily as needed for muscle spasms., Disp: 30 tablet, Rfl: 0 .  diclofenac (VOLTAREN) 75 MG EC tablet, Take 1 tablet (75 mg total) by mouth 2 (two) times daily., Disp: 30 tablet, Rfl: 0 .  fluticasone (FLONASE) 50 MCG/ACT nasal spray, Place 2 sprays into both nostrils daily., Disp: 16 g, Rfl: 6 .  ibuprofen (ADVIL,MOTRIN) 200 MG tablet, Take 400 mg by mouth daily as needed for headache or moderate pain., Disp: , Rfl:  .  Insulin Pen Needle (BD PEN NEEDLE MICRO U/F) 32G X 6 MM MISC, UAD to inject Saxenda daily., Disp: 100 each, Rfl: 1 .  montelukast (SINGULAIR) 10 MG tablet, Take 1 tablet (10 mg total) by mouth at bedtime., Disp: 30 tablet, Rfl: 3 .  omeprazole (PRILOSEC) 20 MG capsule, TAKE (1) CAPSULE DAILY BEFORE BREAKFAST., Disp: 90 capsule, Rfl: 3 .  ondansetron (ZOFRAN-ODT) 4 MG disintegrating tablet, Take 1 tablet (4 mg total) by mouth every 6 (six) hours as needed for nausea., Disp: 20 tablet, Rfl: 0 .  sulfamethoxazole-trimethoprim (BACTRIM DS)  800-160 MG tablet, Take 1 tablet by mouth 2 (two) times daily., Disp: 20 tablet, Rfl: 0 .  tamsulosin (FLOMAX) 0.4 MG CAPS capsule, Take 1 capsule (0.4 mg total) by mouth daily., Disp: 30 capsule, Rfl: 3 Social History   Socioeconomic History  . Marital status: Married    Spouse name: Not on file  . Number of children: 0  . Years of education: Not on file  . Highest education level: Not on file  Occupational History  . Occupation: Engineer, petroleum, Warehouse manager for resource side  Social Needs  . Financial resource strain: Not on file  . Food insecurity:    Worry: Not on file    Inability: Not on file  .  Transportation needs:    Medical: Not on file    Non-medical: Not on file  Tobacco Use  . Smoking status: Never Smoker  . Smokeless tobacco: Never Used  Substance and Sexual Activity  . Alcohol use: No  . Drug use: No  . Sexual activity: Not on file  Lifestyle  . Physical activity:    Days per week: Not on file    Minutes per session: Not on file  . Stress: Not on file  Relationships  . Social connections:    Talks on phone: Not on file    Gets together: Not on file    Attends religious service: Not on file    Active member of club or organization: Not on file    Attends meetings of clubs or organizations: Not on file    Relationship status: Not on file  . Intimate partner violence:    Fear of current or ex partner: Not on file    Emotionally abused: Not on file    Physically abused: Not on file    Forced sexual activity: Not on file  Other Topics Concern  . Not on file  Social History Narrative  . Not on file   Family History  Problem Relation Age of Onset  . Heart failure Mother   . Hypertension Mother   . Heart disease Mother   . Cancer Other        mother's side of family, breast cancer  . Cancer Other        father's side of family, leukemia  . Hypertension Father   . Stroke Father 11  . Diabetes Other   . Hypertension Other   . Colon cancer Cousin   . Healthy Brother   . Breast cancer Maternal Aunt 52  . Leukemia Paternal Aunt   . Kidney disease Maternal Grandmother   . Hypertension Maternal Grandmother   . Cancer Maternal Grandfather   . Leukemia Paternal Grandmother   . Cancer Paternal Grandfather     Objective: Office vital signs reviewed. BP (!) 146/85   Pulse 69   Temp 98.7 F (37.1 C) (Oral)   Ht '5\' 9"'  (1.753 m)   Wt (!) 411 lb (186.4 kg)   BMI 60.69 kg/m   Physical Examination:  General: Awake, alert, obese, No acute distress HEENT: Normal; no facial pain or sensitivity over the temples.  No carotid bruits.    Eyes: PERRLA,  extraocular membranes intact, sclera white Extremities: warm, well perfused, No edema, cyanosis or clubbing; +2 pulses bilaterally MSK: antlagic gait and station  Right knee: Anatomy somewhat obscured by body habitus.  No tenderness to palpation to the patella, patellar tendon or quad tendon.  No ligamentous laxity.  No appreciable joint effusion.  No increased warmth or erythema.  No soft tissue swelling. Neuro: No focal neurologic deficits.  Visual acuity exam was 20/20 bilaterally; 20/20 on the left and 20/25 on the right.  JOINT INJECTION:  Patient denies allergy to antiseptics (including iodine) and anesthetics.  Patient denies h/o diabetes, frequent steroid use, use of blood thinners/ antiplatelets.  Patient was given informed consent and a signed copy has been placed in the chart. Appropriate time out was taken. Area prepped and draped in usual sterile fashion. Anatomic landmarks were identified and injection site was marked.  Ethyl chloride spray was used to numb the area and 1 cc of methylprednisolone 40 mg/ml plus  3 cc of 1% lidocaine without epinephrine was injected into the right knee using a(n) anteriolateral approach. The patient tolerated the procedure well and there were no immediate complications. Estimated blood loss is less than  1 cc.  Post procedure instructions were reviewed.   Assessment/ Plan: 38 y.o. female   1. Transient vision disturbance of right eye Of uncertain etiology.  We discussed multiple differential diagnosis including amaurosis fugax, partial retinal detachment, glaucoma, giant cell arteritis.  Unfortunately, there has been quite a bit of difficulty obtaining a formal ophthalmologic exam to visualize the retina and check pressures.  The soonest that we were able to obtain an appointment is May.  This has been scheduled.  In the interim, I will check CRP, ESR to look for GCA and check carotid ultrasound to look for amaurosis fugax.  We discussed reasons for  emergent evaluation the emergency department and I certainly urged her that if symptoms were to return that she seek immediate medical attention the emergency department.  She was good understanding. - US Carotid Duplex Bilateral; Future - Ambulatory referral to Ophthalmology - Sedimentation Rate - C-reactive protein  2. Chronic pain of right knee Treated with a corticosteroid injection today.  Home care instructions reviewed and reasons for emergent evaluation discussed. - methylPREDNISolone acetate (DEPO-MEDROL) injection 40 mg   No orders of the defined types were placed in this encounter.  No orders of the defined types were placed in this encounter.    [name], DO [place] [phone]

## 2018-05-18 NOTE — Patient Instructions (Signed)
Amaurosis Fugax Amaurosis fugax is a condition in which you temporarily lose sight in one eye. The loss of vision in the affected eye may be total or partial. The vision loss usually lasts for only a few seconds or minutes before sight returns to normal. Occasionally, it may last for several hours. This condition is caused by interruption of blood flow in the artery that supplies blood to the retina. The retina is the part of your eye that contains the nerves needed for sight. This condition can be a warning sign of a stroke. A stroke can result in permanent vision loss or loss of other body functions. What are the causes? This condition is caused by a loss or interruption of blood flow to the retinal artery. Causes for the change in blood flow include:  Atherosclerosis, which is a buildup of cholesterol and fats (plaque). If some of that plaque breaks off and gets into the bloodstream, it can travel (embolize) to other blood vessels, such as the retinal artery.  Diseases of the heart valves.  Certain diseases of the blood, such as sickle cell anemia and leukemia.  Blood clotting (coagulation) disorders.  Inflammation of the arteries (vasculitis).  An irregular heartbeat, such as atrial fibrillation. What increases the risk? The following factors may make you more likely to develop this condition:  Use of any tobacco products, including cigarettes, chewing tobacco, or electronic cigarettes.  Poorly controlled diabetes.  Heart disease.  High blood pressure (hypertension).  High cholesterol.  Excessive alcohol use.  Use of illegal drugs, especially cocaine.  Age. The risk increases with age.  Family history of stroke. What are the signs or symptoms? The main symptom of this condition is painless, sudden loss of vision in one eye. The vision loss often starts at the top and moves down, as if a curtain is being pulled down over your eye. This is usually followed by a quick return of  vision. However, symptoms may last for several hours. It is important to seek medical care right away even if your symptoms go away. How is this diagnosed? This condition is diagnosed by:  Medical history and physical exam.  Eye exam.  Carotid ultrasound. This checks to see if plaque has built up in the carotid arteries in your neck.  Magnetic resonance angiography (MRA). This checks the anatomy of the carotid artery and the branches that supply the brain. It looks for areas of blockage or disease. You may also have blood tests, an electrocardiogram (ECG) to check your heart rhythm, and an echocardiogram (ECHO) to check your heart function. How is this treated? Emergency treatment for this condition may involve massaging the eyeball or using certain breathing techniques to remove or ease the blockage of the retinal artery. Other treatments for this condition focuses on reducing your risk of having a stroke in the future. This may include:  Medicines. You may be given medicines to control high blood pressure, manage diabetes, or manage cholesterol, if needed. Blood thinning medicines may also be prescribed.  Procedures to either remove plaque in your carotid arteries or widen carotid arteries that have become narrow due to plaque. These may include: ? Carotid endarterectomy. This is a surgical procedure that removes plaque from the carotid artery. ? Carotid angioplasty and stenting. In this procedure, a thin tube with a balloon is placed into the artery and is then inflated to open the blocked portion of the artery. A stent can be placed to keep that section of the artery  open.  Lifestyle changes to reduce your risk of having a stroke. These can include changing your diet and getting enough exercise. It is possible that you may not need any immediate treatment. Follow these instructions at home: Eating and drinking  Eat a diet that includes five or more servings of fruits and vegetables each  day. This may reduce the risk of stroke. Certain diets may be recommended to deal with high blood pressure, high cholesterol, diabetes, or obesity.  A diet that is low in sodium is recommended to manage high blood pressure.  A high-fiber diet that is low in saturated fat, trans fat, and cholesterol may control cholesterol levels.  A low-carbohydrate, low-sugar diet is recommended to manage diabetes.  A reduced-calorie diet that is low in sodium, saturated fat, trans fat, and cholesterol is recommended to manage obesity. Lifestyle  Maintain a healthy weight.  Stay physically active. It is recommended that you get at least 30 minutes of activity on most or all days.  Do not use any products that contain nicotine or tobacco, such as cigarettes and e-cigarettes. If you need help quitting, ask your health care provider.  Limit alcohol intake to no more than 1 drink a day for nonpregnant women and 2 drinks a day for men. One drink equals 12 oz of beer, 5 oz of wine, or 1 oz of hard liquor.  Do not abuse drugs. General instructions  Take over-the-counter and prescription medicines only as told by your health care provider. Follow the directions carefully. Make sure that you understand all of your medicine instructions. Medicines may be used to control the risk factors of stroke.  Keep all follow-up visits as told by your health care provider. This is important. Contact a health care provider if:  You lose vision in one eye or both eyes. Get help right away if:  You have chest pain or an irregular heartbeat.  You have any symptoms of stroke. The acronym BEFAST is an easy way to remember the main warning signs of stroke. ? B = Balance problems. Signs include dizziness, sudden trouble walking, or loss of balance. ? E = Eye problems. This includes trouble seeing or a sudden change in vision. ? F = Face changes. This includes sudden weakness or numbness of the face, or the face or eyelid  drooping to one side. ? A = Arm weakness or numbness. This happens suddenly and usually on one side of the body. ? S = Speech problems. This includes trouble speaking or trouble understanding. ? T = Time. Time to call 911 or seek emergency care. Do not wait to see if symptoms will go away. Make note of the time your symptoms started.  Other signs of stroke may include: ? A sudden, severe headache with no known cause. ? Nausea or vomiting. ? Seizure. These symptoms may represent a serious problem that is an emergency. Do not wait to see if the symptoms will go away. Get medical help right away. Call your local emergency services (911 in the [place] Do not drive yourself to the hospital. Summary  Amaurosis fugax is a condition in which you temporarily lose your sight in one eye.  This condition can be a warning sign of a stroke. A stroke can result in permanent vision loss or loss of other body functions.  Seek medical care right away even if your symptoms go away. This information is not intended to replace advice given to you by your health care provider. Make sure  you discuss any questions you have with your health care provider. Document Released: 11/21/2007 Document Revised: 02/15/2016 Document Reviewed: 02/15/2016 Elsevier Interactive Patient Education  2019 [employer].

## 2018-05-19 ENCOUNTER — Encounter: Payer: Self-pay | Admitting: Family Medicine

## 2018-05-19 ENCOUNTER — Other Ambulatory Visit: Payer: Self-pay | Admitting: Family Medicine

## 2018-05-19 DIAGNOSIS — R700 Elevated erythrocyte sedimentation rate: Secondary | ICD-10-CM

## 2018-05-19 DIAGNOSIS — R519 Headache, unspecified: Secondary | ICD-10-CM

## 2018-05-19 DIAGNOSIS — H539 Unspecified visual disturbance: Secondary | ICD-10-CM

## 2018-05-19 DIAGNOSIS — R51 Headache: Secondary | ICD-10-CM

## 2018-05-19 LAB — C-REACTIVE PROTEIN: CRP: 9 mg/L (ref 0–10)

## 2018-05-19 LAB — SEDIMENTATION RATE: Sed Rate: 45 mm/hr — ABNORMAL HIGH (ref 0–32)

## 2018-05-19 MED ORDER — OMEPRAZOLE 40 MG PO CPDR: 40.0000 mg | DELAYED_RELEASE_CAPSULE | Freq: Every day | ORAL | 1 refills | Status: DC

## 2018-05-19 MED ORDER — PREDNISONE 20 MG PO TABS: 60.0000 mg | ORAL_TABLET | Freq: Every day | ORAL | 0 refills | Status: AC

## 2018-05-27 ENCOUNTER — Other Ambulatory Visit: Payer: Self-pay

## 2018-05-27 DIAGNOSIS — I6529 Occlusion and stenosis of unspecified carotid artery: Secondary | ICD-10-CM

## 2018-05-29 ENCOUNTER — Encounter: Payer: Self-pay | Admitting: Vascular Surgery

## 2018-05-29 ENCOUNTER — Encounter (HOSPITAL_COMMUNITY): Payer: Self-pay

## 2018-05-29 ENCOUNTER — Encounter: Payer: Self-pay | Admitting: Family

## 2018-05-29 ENCOUNTER — Inpatient Hospital Stay (HOSPITAL_COMMUNITY): Admission: RE | Admit: 2018-05-29 | Payer: Self-pay | Source: Ambulatory Visit

## 2018-06-16 ENCOUNTER — Encounter: Payer: Self-pay | Admitting: Family Medicine

## 2018-06-16 ENCOUNTER — Other Ambulatory Visit: Payer: Self-pay | Admitting: Family Medicine

## 2018-06-16 MED ORDER — CYCLOBENZAPRINE HCL 5 MG PO TABS: 5.0000 mg | ORAL_TABLET | Freq: Three times a day (TID) | ORAL | 0 refills | Status: DC | PRN

## 2018-06-19 ENCOUNTER — Ambulatory Visit (HOSPITAL_COMMUNITY)
Admission: RE | Admit: 2018-06-19 | Discharge: 2018-06-19 | Disposition: A | Discharge status: Home / Self Care | Payer: Self-pay | Source: Ambulatory Visit | Attending: Family Medicine | Admitting: Family Medicine

## 2018-06-19 ENCOUNTER — Other Ambulatory Visit: Payer: Self-pay

## 2018-06-19 ENCOUNTER — Other Ambulatory Visit: Payer: Self-pay | Admitting: Family Medicine

## 2018-06-19 DIAGNOSIS — I6523 Occlusion and stenosis of bilateral carotid arteries: Secondary | ICD-10-CM | POA: Diagnosis not present

## 2018-06-19 DIAGNOSIS — H539 Unspecified visual disturbance: Secondary | ICD-10-CM | POA: Insufficient documentation

## 2018-06-19 MED ORDER — DICLOFENAC SODIUM 75 MG PO TBEC: 75.0000 mg | DELAYED_RELEASE_TABLET | Freq: Two times a day (BID) | ORAL | 1 refills | Status: DC | PRN

## 2018-07-15 ENCOUNTER — Ambulatory Visit: Payer: Self-pay | Admitting: Gastroenterology

## 2018-07-15 ENCOUNTER — Encounter: Payer: Self-pay | Admitting: Gastroenterology

## 2018-07-15 ENCOUNTER — Other Ambulatory Visit: Payer: Self-pay

## 2018-07-15 DIAGNOSIS — K219 Gastro-esophageal reflux disease without esophagitis: Secondary | ICD-10-CM

## 2018-07-15 DIAGNOSIS — K5732 Diverticulitis of large intestine without perforation or abscess without bleeding: Secondary | ICD-10-CM

## 2018-07-15 DIAGNOSIS — Z1211 Encounter for screening for malignant neoplasm of colon: Secondary | ICD-10-CM | POA: Insufficient documentation

## 2018-07-15 NOTE — Assessment & Plan Note (Signed)
AVERAGE RISK  NEXT TCS IN 2026.

## 2018-07-15 NOTE — Assessment & Plan Note (Signed)
SYMPTOMS FAIRLY WELL CONTROLLED.  CONTINUE OMEPRAZOLE.  TAKE 30 MINUTES PRIOR TO YOUR FIRST MEAL. FOLLOW UP IN 1 YEAR.  

## 2018-07-15 NOTE — Assessment & Plan Note (Signed)
SYMPTOMS CONTROLLED/RESOLVED.  CONTINUE TO MONITOR SYMPTOMS. OUTPATIENT VISIT IF NEEDED.

## 2018-07-15 NOTE — Progress Notes (Signed)
Subjective:    Patient ID: Marilyn Rivera, female    DOB: 1980/07/27, 38 y.o.   MRN: [id number]   Primary Care Physician:  [name], DO  Primary GI:  Barney Drain, MD   Patient Location: home   Provider Location: [facility] office   Reason for Visit: GERD, DIVERTICULITIS.   Persons present on the virtual encounter, with roles: patient, myself (provider), MARTINA BOOTH CMA (update meds/allergies)   Total time (minutes) spent on medical discussion:  10 MINUTES   Due to COVID-19, visit was VIA TELEPHONE VISIT DUE TO COVID 19. VISIT IS CONDUCTED VIRTUALLY AND WAS REQUESTED BY PATIENT.   Virtual Visit via TELEPHONE   I connected with Marilyn Rivera and verified that I am speaking with the correct person using two identifiers.   I discussed the limitations, risks, security and privacy concerns of performing an evaluation and management service by telephone/video and the availability of in person appointments. I also discussed with the patient that there may be a patient responsible charge related to this service. The patient expressed understanding and agreed to proceed.  HPI NO ISSUES SINCE SURGERY. MAY HAVE PAIN ON LEFT SIDE. IT MAY LAST 10 MINS. RARE NAUSEA. TRYING TO LOSE ~100 LBS. 3RD DAY ON THRIVE. WORKED AT [employer] AND GOT LAID OFF. WENT TO [employer]. COLLECTING UNEMPLOYMENT. DOING WELL AFTER SURGERY(SIGMOID COLON RESECTION). BMs: MOVING 2-3/DAY. HEARTBURN CONTROLLED NOW ON OMEPRAZOLE 40 MG ONCE  A DAY.  PT DENIES FEVER, CHILLS, HEMATOCHEZIA, HEMATEMESIS, vomiting, melena, diarrhea, CHEST PAIN, SHORTNESS OF BREATH, CHANGE IN BOWEL IN HABITS, constipation,  problems swallowing, OR heartburn or indigestion.  Past Medical History:  Diagnosis Date  . Arthritis   . Diverticulitis large intestine w/o perforation or abscess w/o bleeding 10/05/2014  . Diverticulosis   . GERD (gastroesophageal reflux disease)    history of H Pylori  . Helicobacter pylori gastritis 09/06/2014  .  History of kidney stones   . Iron deficiency anemia   . Kidney stones    Past Surgical History:  Procedure Laterality Date  . CENTRAL VENOUS CATHETER INSERTION Right 06/02/2017   Procedure: INSERTION CENTRAL LINE RIGHT INTERNAL JUGULAR;  Surgeon: Virl Cagey, MD;  Location: [facility];  Service: General;  Laterality: Right;  . COLONOSCOPY N/A 06/10/2014   Dr. Oneida Alar; redundant sigmoid colon, moderate diverticulosis in the sigmoid and descending colon. small internal hemorrhoids. Next screening at age 35.   Marland Kitchen ESOPHAGOGASTRODUODENOSCOPY N/A 06/10/2014   Dr. Oneida Alar: H.pylori gastritis s/p treatment with Amoxicillin and Biaxin  . KNEE SURGERY Right   . PARTIAL COLECTOMY N/A 06/02/2017   Procedure: PARTIAL SIGMOID COLECTOMY (OPEN);  Surgeon: Virl Cagey, MD;  Location: [facility];  Service: General;  Laterality: N/A;  . renal calculi removal Left 2010  . SKIN LESION EXCISION     over right eyebrow due to wax being left above eye and it seeped down into pore   Allergies  Allergen Reactions  . Keflex [Cephalexin]     Yeast infections   . Coconut Fatty Acids Itching and Rash    Current Outpatient Medications  Medication Sig    . aspirin EC 325 MG tablet Take 325 mg by mouth daily. PRN HEADACHE   . cyclobenzaprine (FLEXERIL) 5 MG tablet Take 1 tablet (5 mg total) by mouth 3 (three) times daily as needed for muscle spasms.    . diclofenac (VOLTAREN) 75 MG EC tablet Take 1 tablet (75 mg total) by mouth 2 (two) times daily as needed for moderate  pain. PRN KNEE PAIN (<2X/WEEK)   . fluticasone (FLONASE) 50 MCG/ACT nasal spray Place 2 sprays into both nostrils as needed. )    .      . montelukast (SINGULAIR) 10 MG tablet Take 1 tablet (10 mg total) by mouth at bedtime.    Marland Kitchen omeprazole (PRILOSEC) 40 MG capsule Take 1 capsule (40 mg total) by mouth daily.    . ondansetron (ZOFRAN-ODT) 4 MG disintegrating tablet Take 1 tablet (4 mg total) by mouth every 6 (six) hours as needed for nausea.    .         Review of Systems PER HPI OTHERWISE ALL SYSTEMS ARE NEGATIVE.    Objective:   Physical Exam  TELEPHONE VISIT DUE TO COVID 19, VISIT IS CONDUCTED VIRTUALLY AND WAS REQUESTED BY PATIENT.     Assessment & Plan:

## 2018-07-16 ENCOUNTER — Ambulatory Visit: Payer: BLUE CROSS/BLUE SHIELD

## 2018-07-23 NOTE — Progress Notes (Signed)
CC'D TO PCP °

## 2018-07-30 ENCOUNTER — Encounter: Payer: Self-pay | Admitting: Family Medicine

## 2018-07-31 ENCOUNTER — Other Ambulatory Visit: Payer: Self-pay

## 2018-08-03 ENCOUNTER — Ambulatory Visit (INDEPENDENT_AMBULATORY_CARE_PROVIDER_SITE_OTHER): Payer: BLUE CROSS/BLUE SHIELD | Admitting: Family Medicine

## 2018-08-03 ENCOUNTER — Other Ambulatory Visit: Payer: Self-pay

## 2018-08-03 DIAGNOSIS — J302 Other seasonal allergic rhinitis: Secondary | ICD-10-CM

## 2018-08-03 DIAGNOSIS — G8929 Other chronic pain: Secondary | ICD-10-CM

## 2018-08-03 DIAGNOSIS — M25561 Pain in right knee: Secondary | ICD-10-CM

## 2018-08-03 MED ORDER — DICLOFENAC SODIUM 75 MG PO TBEC: 75.0000 mg | DELAYED_RELEASE_TABLET | Freq: Two times a day (BID) | ORAL | 1 refills | Status: DC | PRN

## 2018-08-03 MED ORDER — CYCLOBENZAPRINE HCL 5 MG PO TABS: 5.0000 mg | ORAL_TABLET | Freq: Three times a day (TID) | ORAL | 0 refills | Status: DC | PRN

## 2018-08-03 MED ORDER — FLUTICASONE PROPIONATE 50 MCG/ACT NA SUSP: 2.0000 | Freq: Every day | NASAL | 6 refills | Status: DC

## 2018-08-03 NOTE — Progress Notes (Signed)
Telephone visit  Subjective: CC: knee pain PCP: [name], DO Marilyn Rivera is a 38 y.o. female calls for telephone consult today. Patient provides verbal consent for consult held via phone.  Location of patient: work Location of provider: [facility] Others present for call:  None  1. Knee pain Over the last week pain has been worse in right knee.  She has bilateral knee pain but right is worse than left.  She notes that pain is worse with bending, particularly with going down steps. Denies any falls.  She reports swelling.  She is using ice, motrin 800mg  qd.  She has been working out more, doing squats and feels that this exacerbates the knee pain.   ROS: Per HPI  Allergies  Allergen Reactions  . Keflex [Cephalexin]     Yeast infections   . Coconut Fatty Acids Itching and Rash   Past Medical History:  Diagnosis Date  . Arthritis   . Diverticulitis large intestine w/o perforation or abscess w/o bleeding 10/05/2014  . Diverticulosis   . GERD (gastroesophageal reflux disease)    history of H Pylori  . Helicobacter pylori gastritis 09/06/2014  . History of kidney stones   . Iron deficiency anemia   . Kidney stones     Current Outpatient Medications:  .  aspirin EC 325 MG tablet, Take 325 mg by mouth daily., Disp: , Rfl:  .  cyclobenzaprine (FLEXERIL) 5 MG tablet, Take 1 tablet (5 mg total) by mouth 3 (three) times daily as needed for muscle spasms., Disp: 30 tablet, Rfl: 0 .  diclofenac (VOLTAREN) 75 MG EC tablet, Take 1 tablet (75 mg total) by mouth 2 (two) times daily as needed for moderate pain., Disp: 30 tablet, Rfl: 1 .  fluticasone (FLONASE) 50 MCG/ACT nasal spray, Place 2 sprays into both nostrils daily. (Patient taking differently: Place 2 sprays into both nostrils as needed. ), Disp: 16 g, Rfl: 6 .  montelukast (SINGULAIR) 10 MG tablet, Take 1 tablet (10 mg total) by mouth at bedtime., Disp: 30 tablet, Rfl: 3 .  omeprazole (PRILOSEC) 40 MG capsule, Take 1  capsule (40 mg total) by mouth daily., Disp: 90 capsule, Rfl: 1 .  ondansetron (ZOFRAN-ODT) 4 MG disintegrating tablet, Take 1 tablet (4 mg total) by mouth every 6 (six) hours as needed for nausea., Disp: 20 tablet, Rfl: 0 .  tamsulosin (FLOMAX) 0.4 MG CAPS capsule, Take 1 capsule (0.4 mg total) by mouth daily. (Patient not taking: Reported on 07/15/2018), Disp: 30 capsule, Rfl: 3  Assessment/ Plan: 38 y.o. female   1. Chronic pain of right knee Patient with longstanding history of right knee pain.  Last corticosteroid injection was 3/23.  We have scheduled her follow-up visit for repeat knee injection and will plan to do both knees on 623.  Patient aware of date and time.  In the interim, recommended oral NSAID.  Diclofenac has been renewed.  Avoid other NSAIDs.  I have also advised her to take Tylenol along with the diclofenac to help better control pain.  Okay to continue ice.  If persistent and unrelieved pain with above treatments, we will need to plan to consider referring back to orthopedics.  In the meantime, she will perform low impact activities and avoid exacerbating exercises. - diclofenac (VOLTAREN) 75 MG EC tablet; Take 1 tablet (75 mg total) by mouth 2 (two) times daily as needed for moderate pain.  Dispense: 30 tablet; Refill: 1  2. Seasonal allergies Controlled but needs refills on Flonase.  This is been sent - fluticasone (FLONASE) 50 MCG/ACT nasal spray; Place 2 sprays into both nostrils daily.  Dispense: 16 g; Refill: 6   Start time: 12:49pm End time: 12:55pm  Total time spent on patient care (including telephone call/ virtual visit): 12 minutes  [place], [place] [phone]

## 2018-08-18 ENCOUNTER — Ambulatory Visit: Payer: Self-pay | Admitting: Family Medicine

## 2018-08-21 ENCOUNTER — Other Ambulatory Visit: Payer: Self-pay

## 2018-08-24 ENCOUNTER — Encounter: Payer: Self-pay | Admitting: Family Medicine

## 2018-08-24 ENCOUNTER — Ambulatory Visit (INDEPENDENT_AMBULATORY_CARE_PROVIDER_SITE_OTHER): Payer: Self-pay | Admitting: Family Medicine

## 2018-08-24 ENCOUNTER — Other Ambulatory Visit: Payer: Self-pay

## 2018-08-24 VITALS — BP 163/89 | HR 94 | Temp 98.5°F | Ht 69.0 in | Wt >= 6400 oz

## 2018-08-24 DIAGNOSIS — M25561 Pain in right knee: Secondary | ICD-10-CM

## 2018-08-24 DIAGNOSIS — M25562 Pain in left knee: Secondary | ICD-10-CM

## 2018-08-24 DIAGNOSIS — M79605 Pain in left leg: Secondary | ICD-10-CM | POA: Insufficient documentation

## 2018-08-24 DIAGNOSIS — H1013 Acute atopic conjunctivitis, bilateral: Secondary | ICD-10-CM

## 2018-08-24 DIAGNOSIS — R030 Elevated blood-pressure reading, without diagnosis of hypertension: Secondary | ICD-10-CM

## 2018-08-24 DIAGNOSIS — G8929 Other chronic pain: Secondary | ICD-10-CM

## 2018-08-24 MED ORDER — METHYLPREDNISOLONE ACETATE 40 MG/ML IJ SUSP
40.0000 mg | Freq: Once | INTRAMUSCULAR | Status: AC
  Administered 2018-08-24: 40 mg via INTRA_ARTICULAR

## 2018-08-24 MED ORDER — METHYLPREDNISOLONE ACETATE 40 MG/ML IJ SUSP: 40.0000 mg | Freq: Once | INTRAMUSCULAR | Status: DC

## 2018-08-24 MED ORDER — OLOPATADINE HCL 0.1 % OP SOLN: 1.0000 [drp] | Freq: Two times a day (BID) | OPHTHALMIC | 12 refills | Status: DC

## 2018-08-24 NOTE — Progress Notes (Signed)
Subjective: CC: chronic knee pain PCP: [name], DO TGG:YIRSWN V Marilyn Rivera is a 38 y.o. female presenting to clinic today for:  1. Chronic knee pain Patient with ongoing bilateral knee pain right greater than left.  Today pain in the right knee is 7 out of 10 in the left 5 out of 10.  She continues to use PRN Voltaren with last dose on Saturday.  This does help relieve some but not as good as the steroid injections.  She last had a steroid injection in the right knee 3 months ago.  She notes that this worked well until about 2-1/2 weeks ago.  No weakness.  She is trying to be physically active and is currently exercising.  She identifies weight as an ongoing issue and contributing factor to the chronic knee pain.  2.  Elevated blood pressure reading without diagnosis of hypertension Again, no recent NSAID use.  She is in pain from bilateral knees today.  She has had some transient issues with vision.  She has not seen the eye doctor but plans to see them soon.  She did have carotid Dopplers which demonstrated no evidence to suggest amaurosis fugax as etiology. No CP, SOB.  3.  Eye allergy Patient reports that she has since our last visit notes that she has an allergy to cats.  She notes associated eye itching and swelling.  She has not used any medications for symptoms except for Benadryl which does seem to be helping.  She has persistent sensation of foreign body in left eye with mild soft tissue swelling below her eye.  Denies any visual disturbance, fevers, pain with eye movement.   ROS: Per HPI  Allergies  Allergen Reactions   Keflex [Cephalexin]     Yeast infections    Coconut Fatty Acids Itching and Rash   Past Medical History:  Diagnosis Date   Arthritis    Diverticulitis large intestine w/o perforation or abscess w/o bleeding 10/05/2014   Diverticulosis    GERD (gastroesophageal reflux disease)    history of H Pylori   Helicobacter pylori gastritis 09/06/2014     History of kidney stones    Iron deficiency anemia    Kidney stones     Current Outpatient Medications:    aspirin EC 325 MG tablet, Take 325 mg by mouth daily., Disp: , Rfl:    cyclobenzaprine (FLEXERIL) 5 MG tablet, Take 1 tablet (5 mg total) by mouth 3 (three) times daily as needed for muscle spasms., Disp: 30 tablet, Rfl: 0   diclofenac (VOLTAREN) 75 MG EC tablet, Take 1 tablet (75 mg total) by mouth 2 (two) times daily as needed for moderate pain., Disp: 30 tablet, Rfl: 1   fluticasone (FLONASE) 50 MCG/ACT nasal spray, Place 2 sprays into both nostrils daily., Disp: 16 g, Rfl: 6   montelukast (SINGULAIR) 10 MG tablet, Take 1 tablet (10 mg total) by mouth at bedtime., Disp: 30 tablet, Rfl: 3   omeprazole (PRILOSEC) 40 MG capsule, Take 1 capsule (40 mg total) by mouth daily., Disp: 90 capsule, Rfl: 1   ondansetron (ZOFRAN-ODT) 4 MG disintegrating tablet, Take 1 tablet (4 mg total) by mouth every 6 (six) hours as needed for nausea., Disp: 20 tablet, Rfl: 0 Social History   Socioeconomic History   Marital status: Married    Spouse name: Not on file   Number of children: 0   Years of education: Not on file   Highest education level: Not on file  Occupational History  Occupation: Engineer, petroleum, 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, product/process development strain: Not on file   Food insecurity    Worry: Not on file    Inability: Not on file   Transportation needs    Medical: Not on file    Non-medical: Not on file  Tobacco Use   Smoking status: Never Smoker   Smokeless tobacco: Never Used  Substance and Sexual Activity   Alcohol use: No   Drug use: No   Sexual activity: Not on file  Lifestyle   Physical activity    Days per week: Not on file    Minutes per session: Not on file   Stress: Not on file  Relationships   Social connections    Talks on phone: Not on file    Gets together: Not on file    Attends religious service: Not on file     Active member of club or organization: Not on file    Attends meetings of clubs or organizations: Not on file    Relationship status: Not on file   Intimate partner violence    Fear of current or ex partner: Not on file    Emotionally abused: Not on file    Physically abused: Not on file    Forced sexual activity: Not on file  Other Topics Concern   Not on file  Social History Narrative   Not on file   Family History  Problem Relation Age of Onset   Heart failure Mother    Hypertension Mother    Heart disease Mother    Cancer Other        mother's side of family, breast cancer   Cancer Other        father's side of family, leukemia   Hypertension Father    Stroke Father 45   Diabetes Other    Hypertension Other    Colon cancer Cousin    Healthy Brother    Breast cancer Maternal Aunt 41   Leukemia Paternal Aunt    Kidney disease Maternal Grandmother    Hypertension Maternal Grandmother    Cancer Maternal Grandfather    Leukemia Paternal Grandmother    Cancer Paternal Grandfather     Objective: Office vital signs reviewed. BP (!) 163/89    Pulse 94    Temp 98.5 F (36.9 C) (Oral)    Ht 5\' 9"  (1.753 m)    Wt (!) 420 lb 12.8 oz (190.9 kg)    LMP 07/24/2018    BMI 62.14 kg/m   Physical Examination:  General: Awake, alert, obese, No acute distress HEENT: Normal    Eyes: PERRLA, extraocular membranes intact, sclera white, she has mild soft tissue swelling noted inferior to left eye. Extremities: warm, well perfused, No edema, cyanosis or clubbing; +2 pulses bilaterally MSK: antalgic gait and station  Left knee: Has full active range of motion.  Bony landmarks are obscured by adiposity.  No tenderness to palpation to the patella, patellar tendon, quad tendon, medial or lateral joint lines.  No ligamentous laxity.  Right knee: Has full active range of motion.  Bony landmarks are obscured by adiposity.  No tenderness to palpation to the patella, patellar  tendon, quad tendon, medial or lateral joint lines.  No ligamentous laxity. Skin: dry; intact; no rashes or lesions Neuro: Light touch and station grossly intact  JOINT INJECTION:  Patient denies allergy to antiseptics (including iodine) and anesthetics.  Patient denies h/o diabetes, frequent steroid use, use of blood  thinners/ antiplatelets.  Patient was given informed consent and a signed copy has been placed in the chart. Appropriate time out was taken. Area prepped and draped in usual sterile fashion. Anatomic landmarks were identified and injection site was marked.  Ethyl chloride spray was used to numb the area and 1 cc of methylprednisolone 40 mg/ml plus  3 cc of 1% lidocaine without epinephrine was injected into the left knee using a(n) anteriorlateral approach. The patient tolerated the procedure well and there were no immediate complications. Estimated blood loss is less than 1 cc.  Post procedure instructions were reviewed and handout outlining these instructions were provided to patient.  JOINT INJECTION:  Patient denies allergy to antiseptics (including iodine) and anesthetics.  Patient denies h/o diabetes, frequent steroid use, use of blood thinners/ antiplatelets.  Patient was given informed consent and a signed copy has been placed in the chart. Appropriate time out was taken. Area prepped and draped in usual sterile fashion. Anatomic landmarks were identified and injection site was marked.  Ethyl chloride spray was used to numb the area and 1 cc of methylprednisolone 40 mg/ml plus  3 cc of 1% lidocaine without epinephrine was injected into the right knee using a(n) anteriolateral approach. The patient tolerated the procedure well and there were no immediate complications. Estimated blood loss is less than 1 cc.  Post procedure instructions were reviewed and handout outlining these instructions were provided to patient.   Assessment/ Plan: 38 y.o. female   1. Chronic pain of right  knee Treated with corticosteroid injection.  Patient tolerated procedure well.  Handout provided.  Follow-up PRN - methylPREDNISolone acetate (DEPO-MEDROL) injection 40 mg  2. Chronic pain of left knee Treated with corticosteroid injection.  Patient tolerated procedure well.  Handout provided.  Follow-up PRN - methylPREDNISolone acetate (DEPO-MEDROL) injection 40 mg  3. Allergic conjunctivitis of both eyes Treat with Patanol twice daily as needed.  We discussed red flag signs and symptoms.  She is to schedule follow-up visit with her eye doctor. - olopatadine (PATANOL) 0.1 % ophthalmic solution; Place 1 drop into both eyes 2 (two) times daily. If needed for eye allergies  Dispense: 5 mL; Refill: 12  4. Elevated blood pressure reading without diagnosis of hypertension Previously normal blood pressure.  I question if this is related to pain from bilateral knees.  She is to monitor blood pressures with goal of less than 140/90.  If persistently elevated will plan to start antihypertensive.   No orders of the defined types were placed in this encounter.  No orders of the defined types were placed in this encounter.    [name], DO [place] [phone]

## 2018-08-24 NOTE — Patient Instructions (Addendum)
Allergic Conjunctivitis A clear membrane (conjunctiva) covers the white part of your eye and the inner surface of your eyelid. Allergic conjunctivitis happens when this membrane has inflammation. This is caused by allergies. Common causes of allergic reactions (allergens) include:  Outdoor allergens, such as: ? Pollen. ? Grass and weeds. ? Mold spores.  Indoor allergens, such as: ? Dust. ? Smoke. ? Mold. ? Pet dander. ? Animal hair. This condition can make your eye red or pink. It can also make your eye feel itchy. This condition cannot be spread from one person to another person (is not contagious). Follow these instructions at home:  Try not to be around things that you are allergic to.  Take or apply over-the-counter and prescription medicines only as told by your doctor. These include any eye drops.  Place a cool, clean washcloth on your eye for 10-20 minutes. Do this 3-4 times a day.  Do not touch or rub your eyes.  Do not wear contact lenses until the inflammation is gone. Wear glasses instead.  Do not wear eye makeup until the inflammation is gone.  Keep all follow-up visits as told by your doctor. This is important. Contact a doctor if:  Your symptoms get worse.  Your symptoms do not get better with treatment.  You have mild eye pain.  You are sensitive to light,  You have spots or blisters on your eyes.  You have pus coming from your eye.  You have a fever. Get help right away if:  You have redness, swelling, or other symptoms in only one eye.  Your vision is blurry.  You have vision changes.  You have very bad eye pain. Summary  Allergic conjunctivitis is caused by allergies. It can make your eye red or pink, and it can make your eye feel itchy.  This condition cannot be spread from one person to another person (is not contagious).  Try not to be around things that you are allergic to.  Take or apply over-the-counter and prescription medicines  only as told by your doctor. These include any eye drops.  Contact your doctor if your symptoms get worse or they do not get better with treatment. This information is not intended to replace advice given to you by your health care provider. Make sure you discuss any questions you have with your health care provider. Document Released: 08/01/2009 Document Revised: 06/02/2018 Document Reviewed: 10/06/2015 Elsevier Patient Education  2020 [employer].   Knee Injection A knee injection is a procedure to get medicine into your knee joint to relieve the pain, swelling, and stiffness of arthritis. Your health care provider uses a needle to inject medicine, which may also help to lubricate and cushion your knee joint. You may need more than one injection. Tell a health care provider about:  Any allergies you have.  All medicines you are taking, including vitamins, herbs, eye drops, creams, and over-the-counter medicines.  Any problems you or family members have had with anesthetic medicines.  Any blood disorders you have.  Any surgeries you have had.  Any medical conditions you have.  Whether you are pregnant or may be pregnant. What are the risks? Generally, this is a safe procedure. However, problems may occur, including:  Infection.  Bleeding.  Symptoms that get worse.  Damage to the area around your knee.  Allergic reaction to any of the medicines.  Skin reactions from repeated injections. What happens before the procedure?  Ask your health care provider about changing or  stopping your regular medicines. This is especially important if you are taking diabetes medicines or blood thinners.  Plan to have someone take you home from the hospital or clinic. What happens during the procedure?   You will sit or lie down in a position for your knee to be treated.  The skin over your kneecap will be cleaned with a germ-killing soap.  You will be given a medicine that numbs the  area (local anesthetic). You may feel some stinging.  The medicine will be injected into your knee. The needle is carefully placed between your kneecap and your knee. The medicine is injected into the joint space.  The needle will be removed at the end of the procedure.  A bandage (dressing) may be placed over the injection site. The procedure may vary among health care providers and hospitals. What can I expect after the procedure?  Your blood pressure, heart rate, breathing rate, and blood oxygen level will be monitored until you leave the hospital or clinic.  You may have to move your knee through its full range of motion. This helps to get all the medicine into your joint space.  You will be watched to make sure that you do not have a reaction to the injected medicine.  You may feel more pain, swelling, and warmth than you did before the injection. This reaction may last about 1-2 days. Follow these instructions at home: Medicines  Take over-the-counter and prescription medicines only as told by your doctor.  Do not drive or use heavy machinery while taking prescription pain medicine.  Do not take medicines such as aspirin and ibuprofen unless your health care provider tells you to take them. Injection site care  Follow instructions from your health care provider about: ? How to take care of your puncture site. ? When and how you should change your dressing. ? When you should remove your dressing.  Check your injection area every day for signs of infection. Check for: ? More redness, swelling, or pain after 2 days. ? Fluid or blood. ? Pus or a bad smell. ? Warmth. Managing pain, stiffness, and swelling   If directed, put ice on the injection area: ? Put ice in a plastic bag. ? Place a towel between your skin and the bag. ? Leave the ice on for 20 minutes, 2-3 times per day.  Do not apply heat to your knee.  Raise (elevate) the injection area above the level of your  heart while you are sitting or lying down. General instructions  If you were given a dressing, keep it dry until your health care provider says it can be removed. Ask your health care provider when you can start showering or taking a bath.  Avoid strenuous activities for as long as directed by your health care provider. Ask your health care provider when you can return to your normal activities.  Keep all follow-up visits as told by your health care provider. This is important. You may need more injections. Contact a health care provider if you have:  A fever.  Warmth in your injection area.  Fluid, blood, or pus coming from your injection site.  Symptoms at your injection site that last longer than 2 days after your procedure. Get help right away if:  Your knee: ? Turns very red. ? Becomes very swollen. ? Is in severe pain. Summary  A knee injection is a procedure to get medicine into your knee joint to relieve the pain, swelling,  and stiffness of arthritis.  A needle is carefully placed between your kneecap and your knee to inject medicine into the joint space.  Before the procedure, ask your health care provider about changing or stopping your regular medicines, especially if you are taking diabetes medicines or blood thinners.  Contact your health care provider if you have any problems or questions after your procedure. This information is not intended to replace advice given to you by your health care provider. Make sure you discuss any questions you have with your health care provider. Document Released: 05/05/2006 Document Revised: 03/03/2017 Document Reviewed: 03/03/2017 Elsevier Patient Education  2020 [employer].

## 2018-10-22 ENCOUNTER — Encounter: Payer: Self-pay | Admitting: Family Medicine

## 2018-10-22 MED ORDER — FLUCONAZOLE 150 MG PO TABS: 150.0000 mg | ORAL_TABLET | Freq: Once | ORAL | 0 refills | Status: AC

## 2018-10-28 ENCOUNTER — Encounter: Payer: Self-pay | Admitting: Family Medicine

## 2018-10-29 ENCOUNTER — Telehealth: Payer: Self-pay

## 2018-10-29 DIAGNOSIS — G8929 Other chronic pain: Secondary | ICD-10-CM

## 2018-10-29 DIAGNOSIS — M25561 Pain in right knee: Secondary | ICD-10-CM

## 2018-10-29 MED ORDER — DICLOFENAC SODIUM 75 MG PO TBEC: 75.0000 mg | DELAYED_RELEASE_TABLET | Freq: Two times a day (BID) | ORAL | 1 refills | Status: DC | PRN

## 2018-11-13 NOTE — Telephone Encounter (Signed)
Erroneous encounter

## 2018-12-02 ENCOUNTER — Other Ambulatory Visit: Payer: Self-pay | Admitting: Family Medicine

## 2018-12-18 ENCOUNTER — Encounter: Payer: Self-pay | Admitting: Family Medicine

## 2019-01-03 ENCOUNTER — Encounter: Payer: Self-pay | Admitting: Family Medicine

## 2019-01-04 ENCOUNTER — Other Ambulatory Visit: Payer: Self-pay

## 2019-01-04 ENCOUNTER — Ambulatory Visit (INDEPENDENT_AMBULATORY_CARE_PROVIDER_SITE_OTHER): Payer: Self-pay | Admitting: Family Medicine

## 2019-01-04 DIAGNOSIS — L723 Sebaceous cyst: Secondary | ICD-10-CM

## 2019-01-04 DIAGNOSIS — M25562 Pain in left knee: Secondary | ICD-10-CM

## 2019-01-04 DIAGNOSIS — M25561 Pain in right knee: Secondary | ICD-10-CM

## 2019-01-04 DIAGNOSIS — L089 Local infection of the skin and subcutaneous tissue, unspecified: Secondary | ICD-10-CM

## 2019-01-04 DIAGNOSIS — G8929 Other chronic pain: Secondary | ICD-10-CM

## 2019-01-04 MED ORDER — DICLOFENAC SODIUM 75 MG PO TBEC: 75.0000 mg | DELAYED_RELEASE_TABLET | Freq: Two times a day (BID) | ORAL | 1 refills | Status: DC | PRN

## 2019-01-04 MED ORDER — DOXYCYCLINE HYCLATE 100 MG PO TABS: 100.0000 mg | ORAL_TABLET | Freq: Two times a day (BID) | ORAL | 0 refills | Status: DC

## 2019-01-04 NOTE — Progress Notes (Signed)
Telephone visit  Subjective: CC: knee pain PCP: [name], DO [id number] Marilyn Rivera is a 38 y.o. female calls for telephone consult today. Patient provides verbal consent for consult held via phone.  Location of patient: home Location of provider: Working remotely from home Others present for call: none  1. Chronic knee pain Patient reports recent flare of bilateral knee pain yesterday.  She notes during the episode of knee pain it was a 9/10.  With the medication she "does fine".  Does not report any GI upset.  She would like to go ahead and get a knee injection soon if that is okay.  No falls.  2. ?Boil She reports onset 2-3 days ago.  She reports recurrent boil in this area.  She is asking for antibiotics for this lesion.  Not currently using any treatments.  ROS: Per HPI  Allergies  Allergen Reactions  . Keflex [Cephalexin]     Yeast infections   . Coconut Fatty Acids Itching and Rash   Past Medical History:  Diagnosis Date  . Arthritis   . Diverticulitis large intestine w/o perforation or abscess w/o bleeding 10/05/2014  . Diverticulosis   . GERD (gastroesophageal reflux disease)    history of H Pylori  . Helicobacter pylori gastritis 09/06/2014  . History of kidney stones   . Iron deficiency anemia   . Kidney stones     Current Outpatient Medications:  .  aspirin EC 325 MG tablet, Take 325 mg by mouth daily., Disp: , Rfl:  .  cyclobenzaprine (FLEXERIL) 5 MG tablet, Take 1 tablet (5 mg total) by mouth 3 (three) times daily as needed for muscle spasms., Disp: 30 tablet, Rfl: 0 .  diclofenac (VOLTAREN) 75 MG EC tablet, Take 1 tablet (75 mg total) by mouth 2 (two) times daily as needed for moderate pain., Disp: 30 tablet, Rfl: 1 .  fluticasone (FLONASE) 50 MCG/ACT nasal spray, Place 2 sprays into both nostrils daily., Disp: 16 g, Rfl: 6 .  montelukast (SINGULAIR) 10 MG tablet, Take 1 tablet (10 mg total) by mouth at bedtime., Disp: 30 tablet, Rfl: 3 .  olopatadine  (PATANOL) 0.1 % ophthalmic solution, Place 1 drop into both eyes 2 (two) times daily. If needed for eye allergies, Disp: 5 mL, Rfl: 12 .  omeprazole (PRILOSEC) 40 MG capsule, Take 1 capsule (40 mg total) by mouth daily. (Needs to be seen before next refill), Disp: 90 capsule, Rfl: 0 .  ondansetron (ZOFRAN-ODT) 4 MG disintegrating tablet, Take 1 tablet (4 mg total) by mouth every 6 (six) hours as needed for nausea., Disp: 20 tablet, Rfl: 0  Assessment/ Plan: 38 y.o. female   1. Bilateral chronic knee pain Tolerating diclofenac daily without difficulty.  No GI issues.  On PPI.  Voltaren renewed.  We discussed consideration for eval by orthopedics but she declined this today.  She will call to schedule corticosteroid injections for the knees.  Okay to do bilaterally.  Last corticosteroid injection was 08/24/2018 - diclofenac (VOLTAREN) 75 MG EC tablet; Take 1 tablet (75 mg total) by mouth 2 (two) times daily as needed for moderate pain.  Dispense: 90 tablet; Refill: 1  2. Infected sebaceous cyst of skin I suspect this is likely an infected sebaceous cyst of the skin given reports of recurrence.  Will empirically treat with doxycycline.  Encouraged warm compresses to the affected area.  Consider eval by general surgery for excision to prevent recurrence. - doxycycline (VIBRA-TABS) 100 MG tablet; Take 1 tablet (100 mg  total) by mouth 2 (two) times daily.  Dispense: 20 tablet; Refill: 0   Start time: 1:55pm End time: 2:09pm  Total time spent on patient care (including telephone call/ virtual visit): 19 minutes  [place], [place] [phone]

## 2019-01-15 ENCOUNTER — Other Ambulatory Visit: Payer: Self-pay

## 2019-01-18 ENCOUNTER — Encounter: Payer: Self-pay | Admitting: Family Medicine

## 2019-01-18 ENCOUNTER — Ambulatory Visit (INDEPENDENT_AMBULATORY_CARE_PROVIDER_SITE_OTHER): Payer: Self-pay | Admitting: Family Medicine

## 2019-01-18 ENCOUNTER — Other Ambulatory Visit: Payer: Self-pay

## 2019-01-18 VITALS — BP 135/72 | HR 81 | Temp 97.8°F | Ht 69.0 in

## 2019-01-18 DIAGNOSIS — M25562 Pain in left knee: Secondary | ICD-10-CM

## 2019-01-18 DIAGNOSIS — Z23 Encounter for immunization: Secondary | ICD-10-CM

## 2019-01-18 DIAGNOSIS — G8929 Other chronic pain: Secondary | ICD-10-CM

## 2019-01-18 DIAGNOSIS — M25561 Pain in right knee: Secondary | ICD-10-CM

## 2019-01-18 MED ORDER — METHYLPREDNISOLONE ACETATE 40 MG/ML IJ SUSP
40.0000 mg | Freq: Once | INTRAMUSCULAR | Status: AC
  Administered 2019-01-18: 40 mg via INTRA_ARTICULAR

## 2019-01-18 NOTE — Patient Instructions (Signed)
Knee Injection A knee injection is a procedure to get medicine into your knee joint to relieve the pain, swelling, and stiffness of arthritis. Your health care provider uses a needle to inject medicine, which may also help to lubricate and cushion your knee joint. You may need more than one injection. Tell a health care provider about:  Any allergies you have.  All medicines you are taking, including vitamins, herbs, eye drops, creams, and over-the-counter medicines.  Any problems you or family members have had with anesthetic medicines.  Any blood disorders you have.  Any surgeries you have had.  Any medical conditions you have.  Whether you are pregnant or may be pregnant. What are the risks? Generally, this is a safe procedure. However, problems may occur, including:  Infection.  Bleeding.  Symptoms that get worse.  Damage to the area around your knee.  Allergic reaction to any of the medicines.  Skin reactions from repeated injections. What happens before the procedure?  Ask your health care provider about changing or stopping your regular medicines. This is especially important if you are taking diabetes medicines or blood thinners.  Plan to have someone take you home from the hospital or clinic. What happens during the procedure?   You will sit or lie down in a position for your knee to be treated.  The skin over your kneecap will be cleaned with a germ-killing soap.  You will be given a medicine that numbs the area (local anesthetic). You may feel some stinging.  The medicine will be injected into your knee. The needle is carefully placed between your kneecap and your knee. The medicine is injected into the joint space.  The needle will be removed at the end of the procedure.  A bandage (dressing) may be placed over the injection site. The procedure may vary among health care providers and hospitals. What can I expect after the procedure?  Your blood  pressure, heart rate, breathing rate, and blood oxygen level will be monitored until you leave the hospital or clinic.  You may have to move your knee through its full range of motion. This helps to get all the medicine into your joint space.  You will be watched to make sure that you do not have a reaction to the injected medicine.  You may feel more pain, swelling, and warmth than you did before the injection. This reaction may last about 1-2 days. Follow these instructions at home: Medicines  Take over-the-counter and prescription medicines only as told by your doctor.  Do not drive or use heavy machinery while taking prescription pain medicine.  Do not take medicines such as aspirin and ibuprofen unless your health care provider tells you to take them. Injection site care  Follow instructions from your health care provider about: ? How to take care of your puncture site. ? When and how you should change your dressing. ? When you should remove your dressing.  Check your injection area every day for signs of infection. Check for: ? More redness, swelling, or pain after 2 days. ? Fluid or blood. ? Pus or a bad smell. ? Warmth. Managing pain, stiffness, and swelling   If directed, put ice on the injection area: ? Put ice in a plastic bag. ? Place a towel between your skin and the bag. ? Leave the ice on for 20 minutes, 2-3 times per day.  Do not apply heat to your knee.  Raise (elevate) the injection area above the level   of your heart while you are sitting or lying down. General instructions  If you were given a dressing, keep it dry until your health care provider says it can be removed. Ask your health care provider when you can start showering or taking a bath.  Avoid strenuous activities for as long as directed by your health care provider. Ask your health care provider when you can return to your normal activities.  Keep all follow-up visits as told by your health  care provider. This is important. You may need more injections. Contact a health care provider if you have:  A fever.  Warmth in your injection area.  Fluid, blood, or pus coming from your injection site.  Symptoms at your injection site that last longer than 2 days after your procedure. Get help right away if:  Your knee: ? Turns very red. ? Becomes very swollen. ? Is in severe pain. Summary  A knee injection is a procedure to get medicine into your knee joint to relieve the pain, swelling, and stiffness of arthritis.  A needle is carefully placed between your kneecap and your knee to inject medicine into the joint space.  Before the procedure, ask your health care provider about changing or stopping your regular medicines, especially if you are taking diabetes medicines or blood thinners.  Contact your health care provider if you have any problems or questions after your procedure. This information is not intended to replace advice given to you by your health care provider. Make sure you discuss any questions you have with your health care provider. Document Released: 05/05/2006 Document Revised: 03/03/2017 Document Reviewed: 03/03/2017 Elsevier Patient Education  2020 Elsevier Inc.  

## 2019-01-18 NOTE — Addendum Note (Signed)
Addended byCarrolyn Leigh on: 01/18/2019 04:19 PM   Modules accepted: Orders

## 2019-01-18 NOTE — Progress Notes (Signed)
Subjective: CC: chronic knee pain PCP: [name], DO [id number] Marilyn Rivera is a 38 y.o. female presenting to clinic today for:  1. Chronic knee pain Patient with ongoing bilateral knee pain right greater than left.  She continues to use PRN Voltaren 75 mg twice daily.  She does note that this does seem to help but not as much as the corticosteroid injections.  She continues to have predominantly lateral knee pain.  Last corticosteroid injection 08/24/2018.  Does not report any falls or sensation changes.   ROS: Per HPI  Allergies  Allergen Reactions   Keflex [Cephalexin]     Yeast infections    Coconut Fatty Acids Itching and Rash   Past Medical History:  Diagnosis Date   Arthritis    Diverticulitis large intestine w/o perforation or abscess w/o bleeding 10/05/2014   Diverticulosis    GERD (gastroesophageal reflux disease)    history of H Pylori   Helicobacter pylori gastritis 09/06/2014   History of kidney stones    Iron deficiency anemia    Kidney stones     Current Outpatient Medications:    aspirin EC 325 MG tablet, Take 325 mg by mouth daily., Disp: , Rfl:    cyclobenzaprine (FLEXERIL) 5 MG tablet, Take 1 tablet (5 mg total) by mouth 3 (three) times daily as needed for muscle spasms., Disp: 30 tablet, Rfl: 0   diclofenac (VOLTAREN) 75 MG EC tablet, Take 1 tablet (75 mg total) by mouth 2 (two) times daily as needed for moderate pain., Disp: 90 tablet, Rfl: 1   doxycycline (VIBRA-TABS) 100 MG tablet, Take 1 tablet (100 mg total) by mouth 2 (two) times daily., Disp: 20 tablet, Rfl: 0   fluticasone (FLONASE) 50 MCG/ACT nasal spray, Place 2 sprays into both nostrils daily., Disp: 16 g, Rfl: 6   montelukast (SINGULAIR) 10 MG tablet, Take 1 tablet (10 mg total) by mouth at bedtime., Disp: 30 tablet, Rfl: 3   olopatadine (PATANOL) 0.1 % ophthalmic solution, Place 1 drop into both eyes 2 (two) times daily. If needed for eye allergies, Disp: 5 mL, Rfl: 12    omeprazole (PRILOSEC) 40 MG capsule, Take 1 capsule (40 mg total) by mouth daily. (Needs to be seen before next refill), Disp: 90 capsule, Rfl: 0   ondansetron (ZOFRAN-ODT) 4 MG disintegrating tablet, Take 1 tablet (4 mg total) by mouth every 6 (six) hours as needed for nausea., Disp: 20 tablet, Rfl: 0 Social History   Socioeconomic History   Marital status: Married    Spouse name: Not on file   Number of children: 0   Years of education: Not on file   Highest education level: Not on file  Occupational History   Occupation: Engineer, petroleum, Warehouse manager for resource side  Social Needs   Financial resource strain: Not on file   Food insecurity    Worry: Not on file    Inability: Not on file   Transportation needs    Medical: Not on file    Non-medical: Not on file  Tobacco Use   Smoking status: Never Smoker   Smokeless tobacco: Never Used  Substance and Sexual Activity   Alcohol use: No   Drug use: No   Sexual activity: Not on file  Lifestyle   Physical activity    Days per week: Not on file    Minutes per session: Not on file   Stress: Not on file  Relationships   Social connections    Talks on phone: Not  on file    Gets together: Not on file    Attends religious service: Not on file    Active member of club or organization: Not on file    Attends meetings of clubs or organizations: Not on file    Relationship status: Not on file   Intimate partner violence    Fear of current or ex partner: Not on file    Emotionally abused: Not on file    Physically abused: Not on file    Forced sexual activity: Not on file  Other Topics Concern   Not on file  Social History Narrative   Not on file   Family History  Problem Relation Age of Onset   Heart failure Mother    Hypertension Mother    Heart disease Mother    Cancer Other        mother's side of family, breast cancer   Cancer Other        father's side of family, leukemia   Hypertension Father     Stroke Father 33   Diabetes Other    Hypertension Other    Colon cancer Cousin    Healthy Brother    Breast cancer Maternal Aunt 15   Leukemia Paternal Aunt    Kidney disease Maternal Grandmother    Hypertension Maternal Grandmother    Cancer Maternal Grandfather    Leukemia Paternal Grandmother    Cancer Paternal Grandfather     Objective: Office vital signs reviewed. BP 135/72    Pulse 81    Temp 97.8 F (36.6 C) (Temporal)    Ht 5' 9" (1.753 m)    BMI 62.14 kg/m   Physical Examination:  General: Awake, alert, obese, No acute distress HEENT: Normal    Eyes: PERRLA, extraocular membranes intact, sclera white, she has mild soft tissue swelling noted inferior to left eye. Extremities: warm, well perfused, No edema, cyanosis or clubbing; +2 pulses bilaterally MSK: antalgic gait and station  Left knee: Has full active range of motion.  Bony landmarks are obscured by adiposity.  No tenderness to palpation to the patella, patellar tendon, quad tendon, medial joint line.  Mild tenderness palpation along the lateral joint line  Right knee: Has full active range of motion.  Bony landmarks are obscured by adiposity.  No tenderness to palpation to the patella, patellar tendon, quad tendon, medial joint line.  Mild tenderness to palpation along the lateral joint line. Neuro: Light touch and station grossly intact    JOINT INJECTION, left knee:  Patient denies allergy to antiseptics (including iodine) and anesthetics.  Patient denies h/o diabetes, frequent steroid use, use of blood thinners/ antiplatelets.  Patient was given informed consent and a signed copy has been placed in the chart. Appropriate time out was taken. Area prepped and draped in usual sterile fashion. Anatomic landmarks were identified and injection site was marked.  Ethyl chloride spray was used to numb the area and 1 cc of methylprednisolone 40 mg/ml plus  3 cc of 1% lidocaine without epinephrine was injected  into the left knee using a(n) anteriorlateral approach. The patient tolerated the procedure well and there were no immediate complications. Estimated blood loss is less than 1 cc.  Post procedure instructions were reviewed and handout outlining these instructions were provided to patient.  JOINT INJECTION, right knee:  Patient denies allergy to antiseptics (including iodine) and anesthetics.  Patient denies h/o diabetes, frequent steroid use, use of blood thinners/ antiplatelets.  Patient was given informed consent and  signed copy has been placed in the chart. Appropriate time out was taken. Area prepped and draped in usual sterile fashion. Anatomic landmarks were identified and injection site was marked.  Resistance was met and needle was totally withdrawn and a new needle was attached and reinserted about 2 mm below original insertion point.  Injection was then administered without difficulty.  Ethyl chloride spray was used to numb the area and 1 cc of methylprednisolone 40 mg/ml plus  3 cc of 1% lidocaine without epinephrine was injected into the right knee using a(n) anteriolateral approach. The patient tolerated the procedure well and there were no immediate complications. Estimated blood loss is less than 2 cc.  Post procedure instructions were reviewed and handout outlining these instructions were provided to patient. ° ° °Assessment/ Plan: °38 y.o. female  ° °1. Bilateral chronic knee pain °Corticosteroid injection was administered bilaterally.  There was some resistance initially on the right side that required removal of the needle and reinsertion with a new needle.  The subsequent injection was administered without difficulty. ° ° °No orders of the defined types were placed in this encounter. ° °No orders of the defined types were placed in this encounter. ° ° ° °Marilyn M Gottschalk, DO °[facility] °[phone] ° ° °

## 2019-02-15 ENCOUNTER — Other Ambulatory Visit: Payer: Self-pay | Admitting: Family Medicine

## 2019-02-20 ENCOUNTER — Other Ambulatory Visit: Payer: Self-pay | Admitting: Family Medicine

## 2019-02-22 ENCOUNTER — Telehealth: Payer: Self-pay | Admitting: Family Medicine

## 2019-02-22 ENCOUNTER — Other Ambulatory Visit: Payer: Self-pay | Admitting: Family Medicine

## 2019-02-22 DIAGNOSIS — M25362 Other instability, left knee: Secondary | ICD-10-CM

## 2019-02-22 DIAGNOSIS — G8929 Other chronic pain: Secondary | ICD-10-CM

## 2019-02-22 MED ORDER — MONTELUKAST SODIUM 10 MG PO TABS: 10.0000 mg | ORAL_TABLET | Freq: Every day | ORAL | 0 refills | Status: DC

## 2019-02-22 MED ORDER — OMEPRAZOLE 40 MG PO CPDR: 40.0000 mg | DELAYED_RELEASE_CAPSULE | Freq: Every day | ORAL | 0 refills | Status: DC

## 2019-02-22 NOTE — Telephone Encounter (Signed)
Referral placed to [facility]

## 2019-02-22 NOTE — Telephone Encounter (Signed)
What is the name of the medication? omeprazole (PRILOSEC) 40 MG capsule    Have you contacted your pharmacy to request a refill? yes  Which pharmacy would you like this sent to? [facility] pt has one pill left will run out today    Patient notified that their request is being sent to the clinical staff for review and that they should receive a call once it is complete. If they do not receive a call within 24 hours they can check with their pharmacy or our office.

## 2019-02-22 NOTE — Telephone Encounter (Signed)
Pt would like to know who you would refer her to for orthopedic surgeon. Left knee dislocated last week, when she gets up her leg wants to give away, who is the best.

## 2019-03-07 IMAGING — DX DG CHEST 2V
2 series · 2 of 2 positions shown · non-contrast
Comparison: Chest x-ray of 04/14/2017

CLINICAL DATA: Preop for abdominal surgery

EXAM:
CHEST - 2 VIEW

[chest pa]
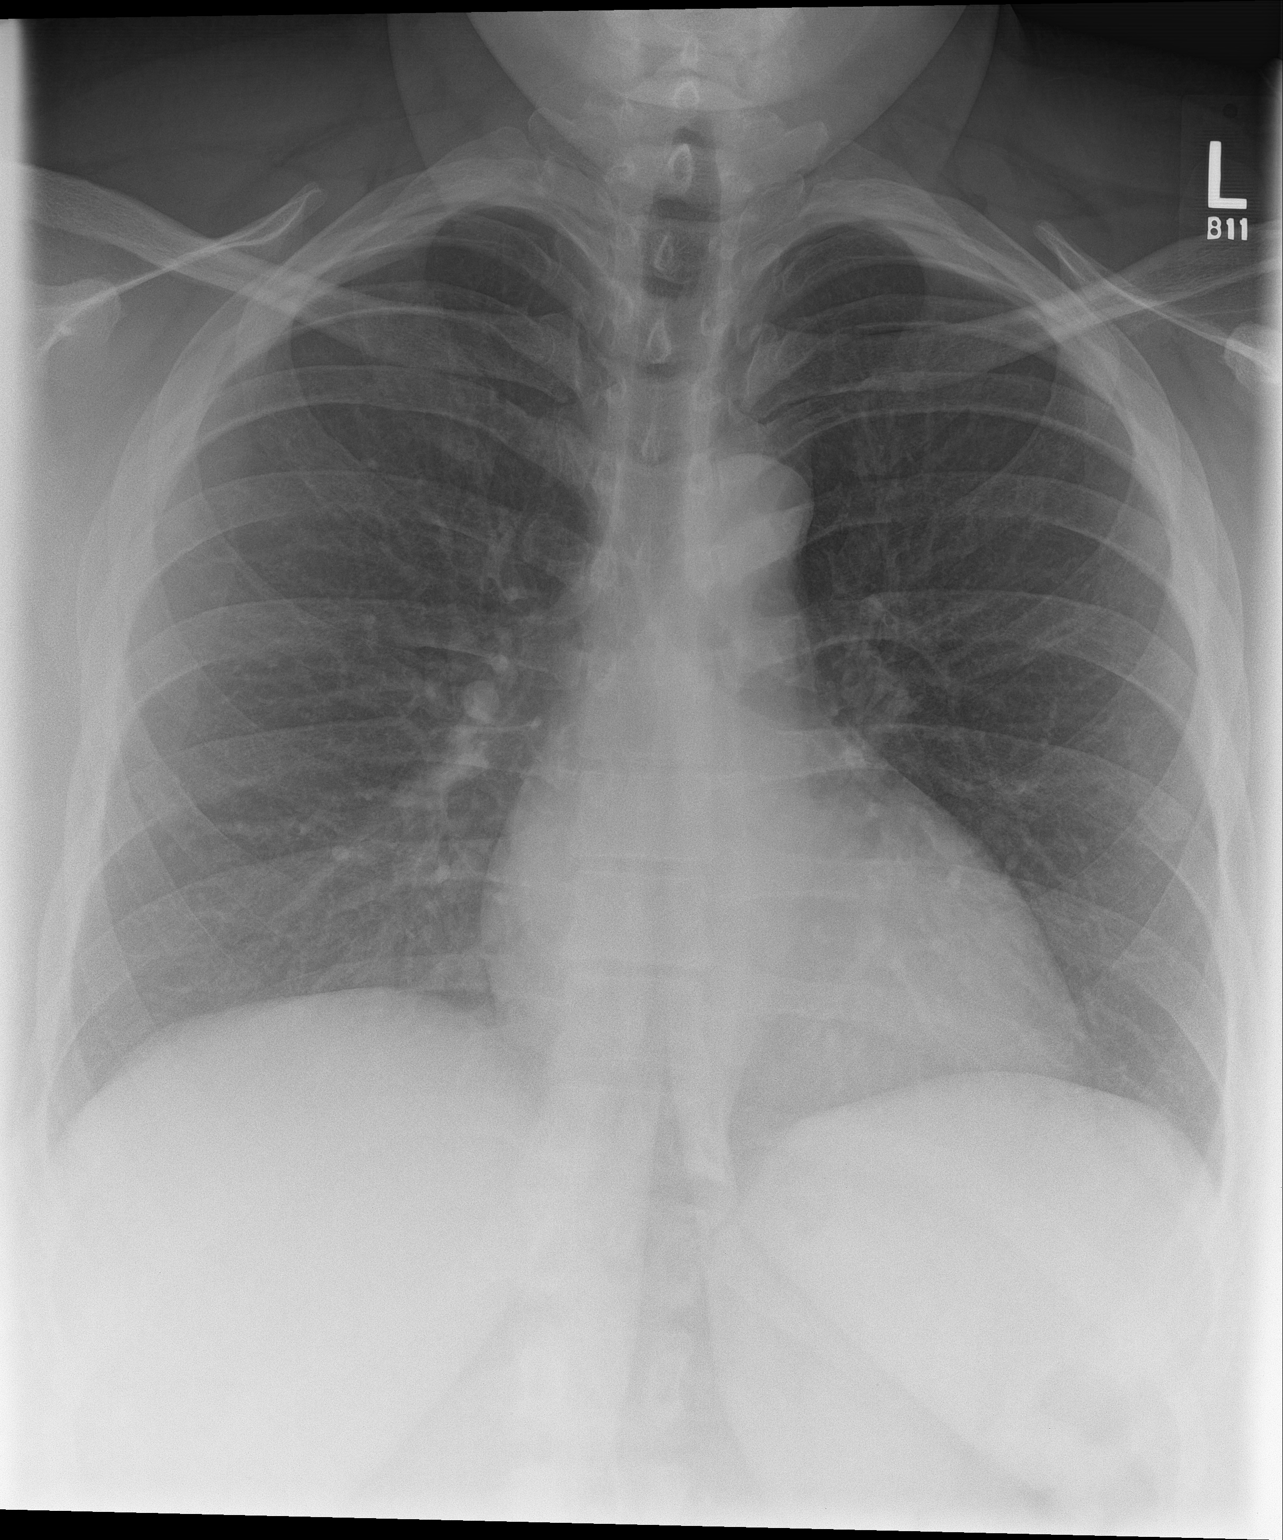

[chest lat]
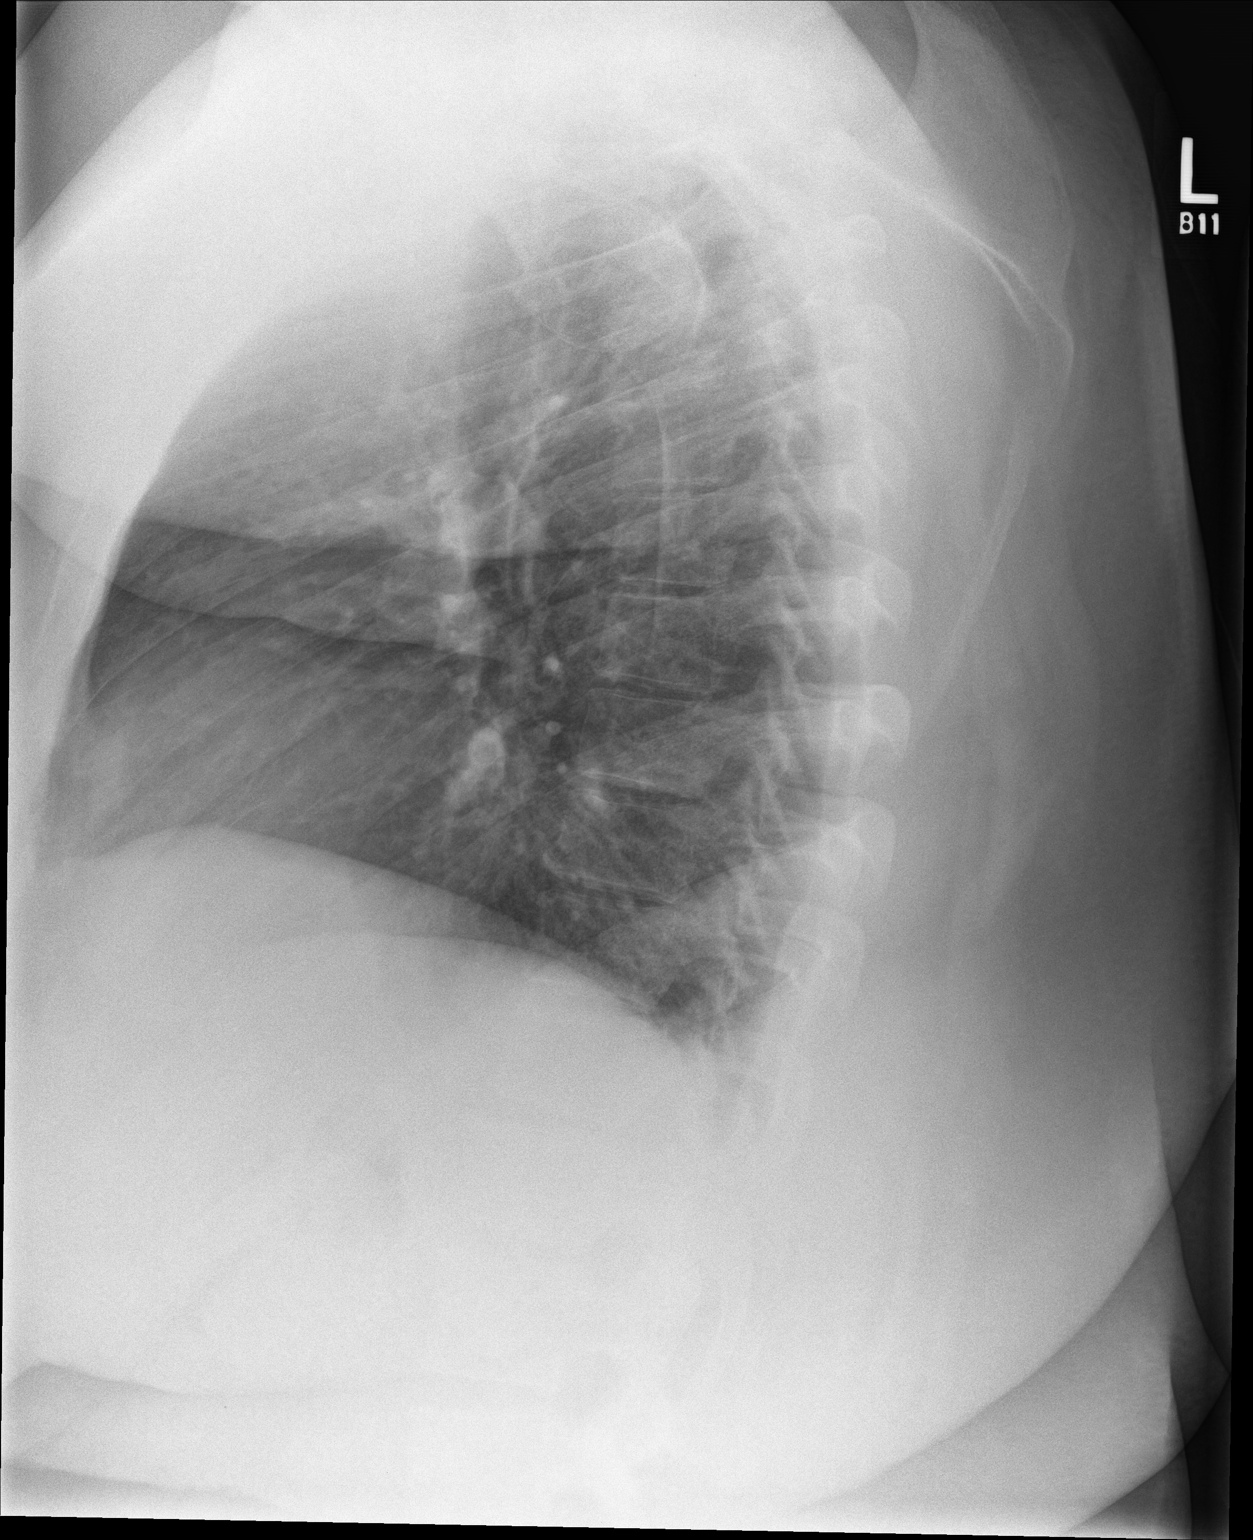

[2 of 2 positions shown; findings below may reference images not displayed]

FINDINGS: No active infiltrate or effusion is seen. Mediastinal and hilar
contours are unremarkable. The heart is within upper limits of
normal. No acute bony abnormality is seen.
IMPRESSION: No active cardiopulmonary disease.

## 2019-03-11 ENCOUNTER — Ambulatory Visit: Payer: Self-pay | Admitting: Orthopaedic Surgery

## 2019-03-12 IMAGING — CR DG CHEST 1V PORT
1 series · 1 of 1 positions shown · non-contrast
Comparison: 05/28/2017

CLINICAL DATA: Status post central line placement

EXAM:
PORTABLE CHEST 1 VIEW

[portable]
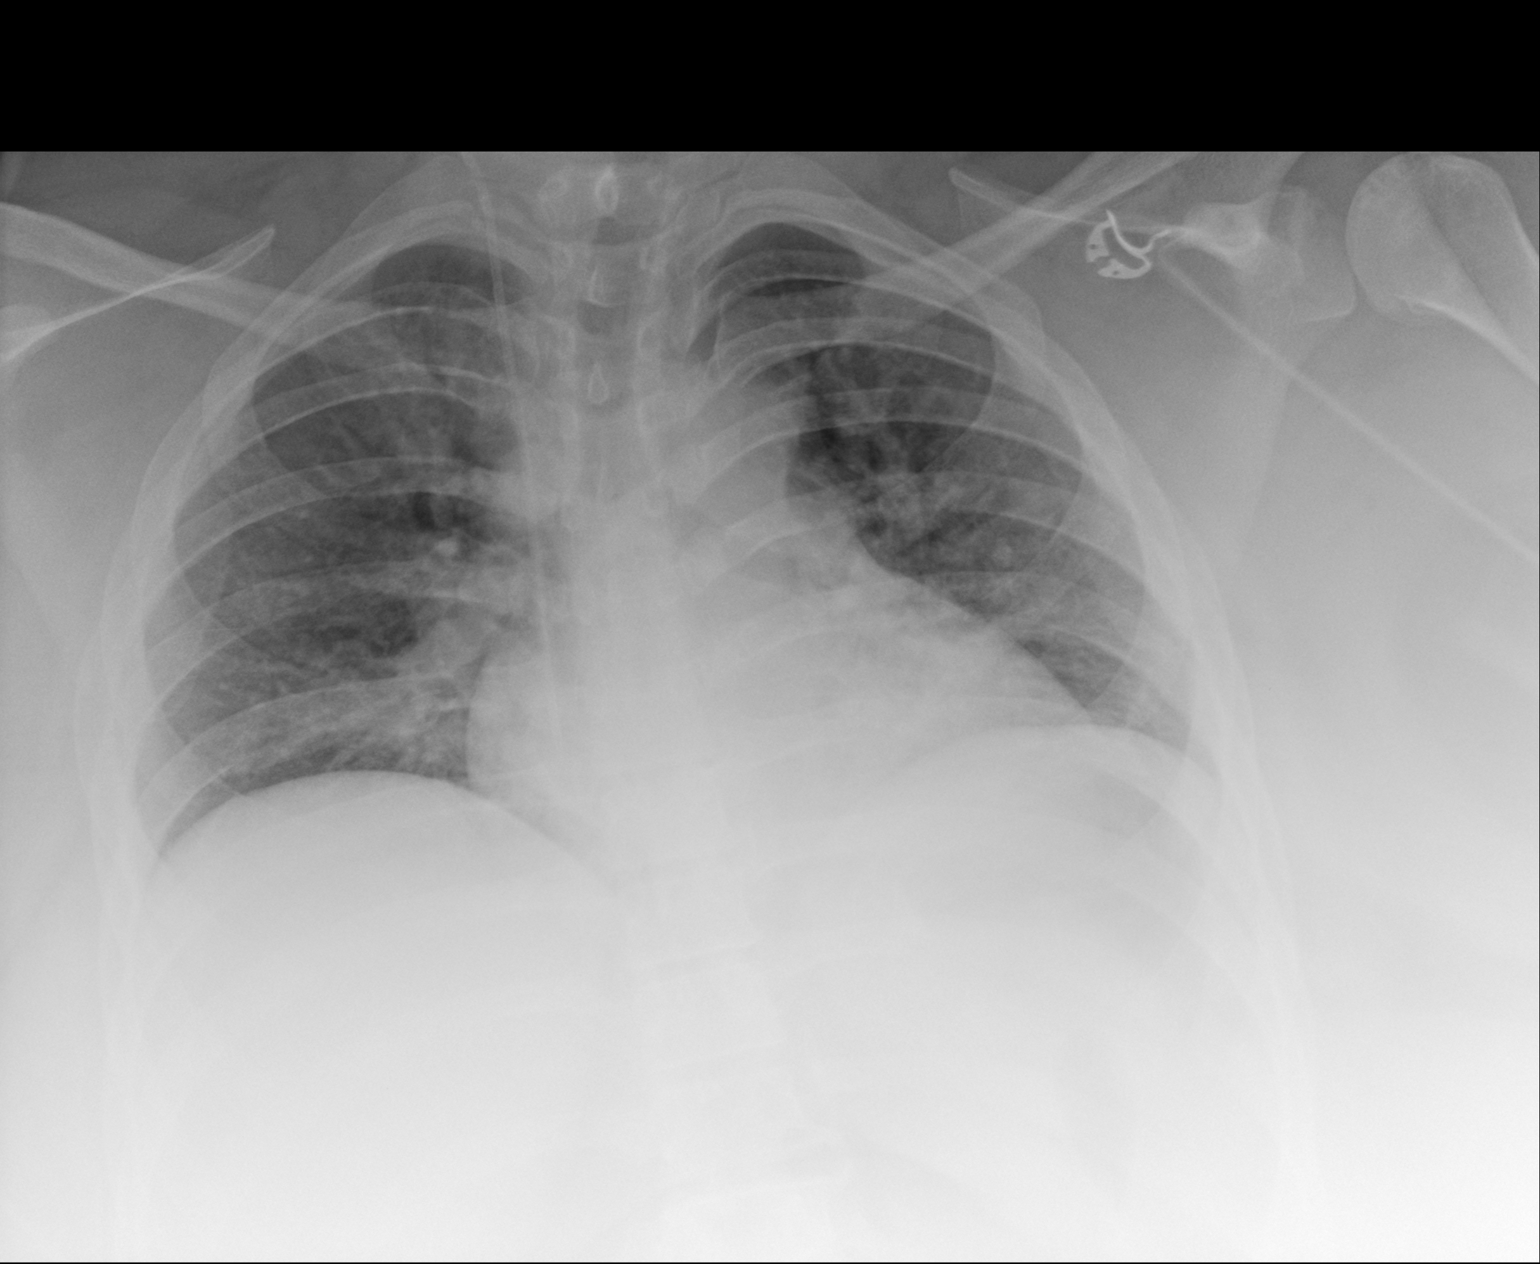

[1 of 1 positions shown; findings below may reference images not displayed]

FINDINGS: Cardiac shadow is within normal limits. The right jugular central
line is noted with catheter tip in the mid right atrium. No
pneumothorax is noted. The overall inspiratory effort is poor. No
bony abnormality is noted.
IMPRESSION: No pneumothorax following central line placement.

Catheter tip is projecting over the right atrium but may be
accentuated by the poor inspiratory effort.

## 2019-03-17 ENCOUNTER — Ambulatory Visit (INDEPENDENT_AMBULATORY_CARE_PROVIDER_SITE_OTHER): Payer: Self-pay | Admitting: Orthopaedic Surgery

## 2019-03-17 ENCOUNTER — Encounter: Payer: Self-pay | Admitting: Orthopaedic Surgery

## 2019-03-17 ENCOUNTER — Other Ambulatory Visit: Payer: Self-pay

## 2019-03-17 ENCOUNTER — Ambulatory Visit (INDEPENDENT_AMBULATORY_CARE_PROVIDER_SITE_OTHER): Payer: Self-pay

## 2019-03-17 VITALS — Ht 70.0 in | Wt >= 6400 oz

## 2019-03-17 DIAGNOSIS — M25562 Pain in left knee: Secondary | ICD-10-CM

## 2019-03-17 DIAGNOSIS — G8929 Other chronic pain: Secondary | ICD-10-CM

## 2019-03-17 NOTE — Progress Notes (Signed)
Office Visit Note   Patient: Marilyn Rivera           Date of Birth: 09-28-1980           MRN: [id number] Visit Date: 03/17/2019              Requested by: [name], DO [place],  [place] [address] PCP: [name], DO   Assessment & Plan: Visit Diagnoses:  1. Chronic pain of left knee     Plan: Chronic pain left knee with positive family history and with morbid obesity.  BMI is 58.  However, having some mechanical symptoms along the lateral compartment of her left knee and there is a possibility of a meniscal tear.  Like to obtain an MRI scan.  Has not had much relief with a recent cortisone injection through her primary care physician.Experiencing mechanical symptoms.  Had a long discussion regarding her weight and how it may be contributing to her knee pain and osteoarthritis  Follow-Up Instructions: Return After MRI scan left knee.   Orders:  Orders Placed This Encounter  Procedures  . XR KNEE 3 VIEW LEFT  . MR Knee Left w/o contrast   No orders of the defined types were placed in this encounter.     Procedures: No procedures performed   Clinical Data: No additional findings.   Subjective: Chief Complaint  Patient presents with  . Left Knee - Pain  Patient presents today for left knee pain. She was standing up at church at the end of December and felt her knee turn, causing immediate pain that took awile to go away. She said that "it comes out of place" on and off since then. She said that it pop and gives way. Swelling, but ice helps. Her pain is located laterally and radiates posteriorly. She takes Diclofenac for her knees. Family history of osteoarthritis of both knees.  Her father is apparently had knee replacements.  Has been trying to lose weight HPI  Review of Systems   Objective: Vital Signs: Ht 5\' 10"  (1.778 m)   Wt (!) 407 lb (184.6 kg)   LMP  (LMP Unknown)   BMI 58.40 kg/m   Physical Exam Constitutional:       Appearance: She is well-developed.  Eyes:     Pupils: Pupils are equal, round, and reactive to light.  Pulmonary:     Effort: Pulmonary effort is normal.  Skin:    General: Skin is warm and dry.  Neurological:     Mental Status: She is alert and oriented to person, place, and time.  Psychiatric:        Behavior: Behavior normal.     Ortho Exam awake alert and oriented x3.  Comfortable sitting.  Very large knees.  BMI 58.  No obvious effusion.  Does experience some pain along the lateral joint line.  No popping clicking.  Positive patella crepitation.  No instability.  Full extension and flexion only to 90 degrees.  No calf pain.  Painless range of motion both hips.  Specialty Comments:  No specialty comments available.  Imaging: XR KNEE 3 VIEW LEFT  Result Date: 03/17/2019 Films of the left knee are obtained in several projections standing.  There are significant tricompartmental degenerative changes consistent with advanced osteoarthritis.  There is partial subluxation of the patella laterally with decrease in the joint space laterally and large peripheral osteophytes on both sides of the joint.  Could have some loose material in  the knee based on the films.  Mild decrease in the medial joint space.  Peripheral osteophytes and mild subchondral sclerosis both tibial surfaces and femoral surfaces medially and laterally.  No ectopic calcification.    PMFS History: Patient Active Problem List   Diagnosis Date Noted  . Elevated blood pressure reading without diagnosis of hypertension 08/24/2018  . Allergic conjunctivitis of both eyes 08/24/2018  . Pain in left knee 08/24/2018  . Colon cancer screening 07/15/2018  . Poor venous access   . Chronic pain of right knee 05/27/2017  . Seasonal allergies 05/14/2017  . Iron deficiency anemia 04/15/2017  . Diverticulitis 04/14/2017  . Rectal bleeding 03/06/2017  . Dysphagia   . Menometrorrhagia 07/24/2016  . GERD (gastroesophageal reflux  disease) 04/24/2016  . Helicobacter pylori gastritis 09/06/2014  . Diverticulitis of colon   . Morbid obesity ([place]) 04/27/2014   Past Medical History:  Diagnosis Date  . Arthritis   . Diverticulitis large intestine w/o perforation or abscess w/o bleeding 10/05/2014  . Diverticulosis   . GERD (gastroesophageal reflux disease)    history of H Pylori  . Helicobacter pylori gastritis 09/06/2014  . History of kidney stones   . Iron deficiency anemia   . Kidney stones     Family History  Problem Relation Age of Onset  . Heart failure Mother   . Hypertension Mother   . Heart disease Mother   . Cancer Other        mother's side of family, breast cancer  . Cancer Other        father's side of family, leukemia  . Hypertension Father   . Stroke Father 24  . Diabetes Other   . Hypertension Other   . Colon cancer Cousin   . Healthy Brother   . Breast cancer Maternal Aunt 28  . Leukemia Paternal Aunt   . Kidney disease Maternal Grandmother   . Hypertension Maternal Grandmother   . Cancer Maternal Grandfather   . Leukemia Paternal Grandmother   . Cancer Paternal Grandfather     Past Surgical History:  Procedure Laterality Date  . CENTRAL VENOUS CATHETER INSERTION Right 06/02/2017   Procedure: INSERTION CENTRAL LINE RIGHT INTERNAL JUGULAR;  Surgeon: Virl Cagey, MD;  Location: [facility];  Service: General;  Laterality: Right;  . COLONOSCOPY N/A 06/10/2014   Dr. Oneida Alar; redundant sigmoid colon, moderate diverticulosis in the sigmoid and descending colon. small internal hemorrhoids. Next screening at age 43.   Marland Kitchen ESOPHAGOGASTRODUODENOSCOPY N/A 06/10/2014   Dr. Oneida Alar: H.pylori gastritis s/p treatment with Amoxicillin and Biaxin  . KNEE SURGERY Right   . PARTIAL COLECTOMY N/A 06/02/2017   Procedure: PARTIAL SIGMOID COLECTOMY (OPEN);  Surgeon: Virl Cagey, MD;  Location: [facility];  Service: General;  Laterality: N/A;  . renal calculi removal Left 2010  . SKIN LESION EXCISION     over  right eyebrow due to wax being left above eye and it seeped down into pore   Social History   Occupational History  . Occupation: Engineer, petroleum, Warehouse manager for resource side  Tobacco Use  . Smoking status: Never Smoker  . Smokeless tobacco: Never Used  Substance and Sexual Activity  . Alcohol use: No  . Drug use: No  . Sexual activity: Not on file

## 2019-04-22 ENCOUNTER — Encounter: Payer: Self-pay | Admitting: Orthopaedic Surgery

## 2019-04-23 ENCOUNTER — Other Ambulatory Visit: Payer: Self-pay

## 2019-04-23 DIAGNOSIS — G8929 Other chronic pain: Secondary | ICD-10-CM

## 2019-04-23 DIAGNOSIS — M25562 Pain in left knee: Secondary | ICD-10-CM

## 2019-04-23 MED ORDER — DICLOFENAC SODIUM 75 MG PO TBEC: 75.0000 mg | DELAYED_RELEASE_TABLET | Freq: Two times a day (BID) | ORAL | 1 refills | Status: DC | PRN

## 2019-05-22 ENCOUNTER — Ambulatory Visit: Payer: Self-pay | Attending: Internal Medicine

## 2019-05-22 DIAGNOSIS — Z23 Encounter for immunization: Secondary | ICD-10-CM

## 2019-05-22 NOTE — Progress Notes (Signed)
   Covid-19 Vaccination Clinic  Name:  Marilyn Rivera    MRN: [id number] DOB: Sep 06, 1980  05/22/2019  [name] was observed post Covid-19 immunization for 15 minutes without incident. She was provided with Vaccine Information Sheet and instruction to access the V-Safe system.   [name] was instructed to call 911 with any severe reactions post vaccine: Marland Kitchen Difficulty breathing  . Swelling of face and throat  . A fast heartbeat  . A bad rash all over body  . Dizziness and weakness   Immunizations Administered    Name Date Dose VIS Date Route   Pfizer COVID-19 Vaccine 05/22/2019  3:18 PM 0.3 mL 02/05/2019 Intramuscular   Manufacturer: [place]   Lot: [id number]   [place]: [id number]

## 2019-05-25 ENCOUNTER — Other Ambulatory Visit: Payer: Self-pay | Admitting: Family Medicine

## 2019-06-15 ENCOUNTER — Ambulatory Visit: Payer: Self-pay | Attending: Internal Medicine

## 2019-06-15 DIAGNOSIS — Z23 Encounter for immunization: Secondary | ICD-10-CM

## 2019-06-15 NOTE — Progress Notes (Signed)
   Covid-19 Vaccination Clinic  Name:  Marilyn Rivera    MRN: [id number] DOB: August 06, 1980  06/15/2019  [name] was observed post Covid-19 immunization for 15 minutes without incident. She was provided with Vaccine Information Sheet and instruction to access the V-Safe system.   [name] was instructed to call 911 with any severe reactions post vaccine: Marland Kitchen Difficulty breathing  . Swelling of face and throat  . A fast heartbeat  . A bad rash all over body  . Dizziness and weakness   Immunizations Administered    Name Date Dose VIS Date Route   Pfizer COVID-19 Vaccine 06/15/2019  4:21 PM 0.3 mL 04/21/2018 Intramuscular   Manufacturer: [place]   Lot: [id number]   [place]: [id number]

## 2019-07-23 ENCOUNTER — Telehealth: Payer: Self-pay

## 2019-07-23 ENCOUNTER — Encounter: Payer: Self-pay | Admitting: Orthopaedic Surgery

## 2019-07-23 ENCOUNTER — Other Ambulatory Visit: Payer: Self-pay

## 2019-07-23 DIAGNOSIS — M25561 Pain in right knee: Secondary | ICD-10-CM

## 2019-07-23 DIAGNOSIS — G8929 Other chronic pain: Secondary | ICD-10-CM

## 2019-07-23 DIAGNOSIS — M25562 Pain in left knee: Secondary | ICD-10-CM

## 2019-07-23 NOTE — Telephone Encounter (Signed)
Tried to call patient regarding mychart message. No answer. Left message. Ask patient to call us back to schedule a nurse visit to have labwork done. She is wanting a refill of Diclofenac but Dr.Whitfield states that she needs labwork done first. I also mentioned that she could call her PCP and see if they would prescribe it since she is wanting the medication today.

## 2019-07-27 ENCOUNTER — Other Ambulatory Visit: Payer: Self-pay

## 2019-07-27 ENCOUNTER — Ambulatory Visit (INDEPENDENT_AMBULATORY_CARE_PROVIDER_SITE_OTHER): Payer: Self-pay

## 2019-07-27 DIAGNOSIS — G8929 Other chronic pain: Secondary | ICD-10-CM

## 2019-07-27 DIAGNOSIS — M25561 Pain in right knee: Secondary | ICD-10-CM

## 2019-07-27 DIAGNOSIS — M25562 Pain in left knee: Secondary | ICD-10-CM

## 2019-07-27 LAB — BASIC METABOLIC PANEL WITH GFR
BUN: 12 mg/dL (ref 7–25)
CO2: 27 mmol/L (ref 20–32)
Calcium: 8.8 mg/dL (ref 8.6–10.2)
Chloride: 105 mmol/L (ref 98–110)
Creat: 0.79 mg/dL (ref 0.50–1.10)
GFR, Est African American: 109 mL/min/{1.73_m2} (ref >=60)
GFR, Est Non African American: 94 mL/min/{1.73_m2} (ref >=60)
Glucose, Bld: 100 mg/dL — ABNORMAL HIGH (ref 65–99)
Potassium: 4.2 mmol/L (ref 3.5–5.3)
Sodium: 140 mmol/L (ref 135–146)

## 2019-07-27 NOTE — Progress Notes (Signed)
Patient came in for BMET labwork today.

## 2019-07-28 ENCOUNTER — Telehealth: Payer: Self-pay | Admitting: Orthopaedic Surgery

## 2019-07-28 ENCOUNTER — Other Ambulatory Visit: Payer: Self-pay

## 2019-07-28 DIAGNOSIS — G8929 Other chronic pain: Secondary | ICD-10-CM

## 2019-07-28 MED ORDER — DICLOFENAC SODIUM 75 MG PO TBEC: 75.0000 mg | DELAYED_RELEASE_TABLET | Freq: Two times a day (BID) | ORAL | 1 refills | Status: DC | PRN

## 2019-07-28 NOTE — Telephone Encounter (Signed)
Patient called.   She wants to know if her test results are in so she can get her medicine.   Call back: [phone]

## 2019-07-28 NOTE — Telephone Encounter (Signed)
Spoke with patient and let her know that it has been refilled.

## 2019-07-28 NOTE — Telephone Encounter (Signed)
Labs ok--ok to renew diclofenac-thanks

## 2019-07-28 NOTE — Telephone Encounter (Signed)
This is the patient last week that called requesting a refill of Diclofenac. She came in yesterday and had a BMET. Please review and let me know if we can refill. Thanks!

## 2019-09-27 ENCOUNTER — Encounter: Payer: Self-pay | Admitting: Orthopaedic Surgery

## 2019-09-29 ENCOUNTER — Other Ambulatory Visit: Payer: Self-pay

## 2019-09-29 ENCOUNTER — Ambulatory Visit (INDEPENDENT_AMBULATORY_CARE_PROVIDER_SITE_OTHER): Payer: Self-pay

## 2019-09-29 ENCOUNTER — Encounter: Payer: Self-pay | Admitting: Orthopaedic Surgery

## 2019-09-29 ENCOUNTER — Ambulatory Visit (INDEPENDENT_AMBULATORY_CARE_PROVIDER_SITE_OTHER): Payer: Self-pay | Admitting: Orthopaedic Surgery

## 2019-09-29 VITALS — Ht 70.0 in | Wt >= 6400 oz

## 2019-09-29 DIAGNOSIS — M79605 Pain in left leg: Secondary | ICD-10-CM

## 2019-09-29 DIAGNOSIS — G8929 Other chronic pain: Secondary | ICD-10-CM

## 2019-09-29 DIAGNOSIS — M25561 Pain in right knee: Secondary | ICD-10-CM

## 2019-09-29 DIAGNOSIS — M25562 Pain in left knee: Secondary | ICD-10-CM

## 2019-09-29 MED ORDER — LIDOCAINE HCL 1 % IJ SOLN
2.0000 mL | INTRAMUSCULAR | Status: AC | PRN
  Administered 2019-09-29: 2 mL

## 2019-09-29 MED ORDER — METHYLPREDNISOLONE ACETATE 40 MG/ML IJ SUSP
80.0000 mg | INTRAMUSCULAR | Status: AC | PRN
  Administered 2019-09-29: 80 mg via INTRA_ARTICULAR

## 2019-09-29 MED ORDER — BUPIVACAINE HCL 0.5 % IJ SOLN
2.0000 mL | INTRAMUSCULAR | Status: AC | PRN
  Administered 2019-09-29: 2 mL via INTRA_ARTICULAR

## 2019-09-29 NOTE — Progress Notes (Signed)
Office Visit Note   Patient: Marilyn Rivera           Date of Birth: [birth year]           MRN: [id number] Visit Date: 09/29/2019              Requested by: [name], DO [address] [place],  [place] [id number] PCP: [name], DO   Assessment & Plan: Visit Diagnoses:  1. Acute pain of left knee   2. Pain in left leg   3. Morbid obesity ([place])     Plan: Recurrent symptoms of osteoarthritis right knee.  Injected her knee last year with cortisone and she like another.  About 2 and half weeks ago she fell through a "rotten deck".  Her left leg went through the rotten wood up to her thigh.  She had considerable bruising which she is demonstrated from her cell phone images.  X-rays are negative for fracture.  She does have significant arthritis of her left knee.  I suspect that she has had soft tissue bruising but no evidence of knee instability.  We will try Voltaren gel and ice.  Office 3 weeks if no improvement.  Hopefully this will resolve without further treatment  Follow-Up Instructions: Return in about 3 weeks (around 10/20/2019).   Orders:  Orders Placed This Encounter  Procedures  . Large Joint Inj: R knee  . XR KNEE 3 VIEW LEFT  . XR Tibia/Fibula Left   No orders of the defined types were placed in this encounter.     Procedures: Large Joint Inj: R knee on 09/29/2019 4:37 PM Indications: pain and diagnostic evaluation Details: 25 G 1.5 in needle, anteromedial approach  Arthrogram: No  Medications: 2 mL lidocaine 1 %; 2 mL bupivacaine 0.5 %; 80 mg methylPREDNISolone acetate 40 MG/ML Procedure, treatment alternatives, risks and benefits explained, specific risks discussed. Consent was given by the patient. Immediately prior to procedure a time out was called to verify the correct patient, procedure, equipment, support staff and site/side marked as required. Patient was prepped and draped in the usual sterile fashion.       Clinical Data: No  additional findings.   Subjective: Chief Complaint  Patient presents with  . Left Leg - Pain  Patient presents today with left knee pain.She states that she fell through a deck over a week ago. Her pain is located at the medial aspect of her knee. She states that it burns and swells. She has numbness in her mid shin and up to her knee. She has soreness all throughout her shin and was bruised after the accident. She is taking Diclofenac and tylenol for pain. It is very sensitive to the touch.  Mrs. Foster obtained cell phone images of her leg after the injury.  There was considerable ecchymosis and swelling about the left knee and proximal tibia.  Those areas have resolved but she still having considerable burning along the medial aspect of her left knee and along the proximal lateral tibia.  Also notes that she is having recurrent symptoms of arthritis in her right knee.  Not diabetic  HPI  Review of Systems   Objective: Vital Signs: Ht 5\' 10"  (1.778 m)   Wt (!) 412 lb (186.9 kg)   BMI 59.12 kg/m   Physical Exam Constitutional:      Appearance: She is well-developed.  Eyes:     Pupils: Pupils are equal, round, and reactive to light.  Pulmonary:  Effort: Pulmonary effort is normal.  Skin:    General: Skin is warm and dry.  Neurological:     Mental Status: She is alert and oriented to person, place, and time.  Psychiatric:        Behavior: Behavior normal.     Ortho Exam large knees.  BMI 59.  No evidence of knee instability.  I do not think she has any effusion.  Does have some burning and pain along the medial aspect of her knee but no instability.  No popping or clicking.  Also has an area of "burning" along the proximal tibia laterally in the peroneal compartments.  No edema.  Does have some swelling of both ankles but no calf pain.  Full extension about 90 degrees of flexion of her left knee when calf touches thigh.  No hip pain.  Straight leg raise negative  Specialty  Comments:  No specialty comments available.  Imaging: XR Tibia/Fibula Left  Result Date: 09/29/2019 Films of the left tibia and fibular were obtained in 2 projections.  No evidence of any fracture  XR KNEE 3 VIEW LEFT  Result Date: 09/29/2019 Films of the left knee were obtained in 3 projections.  There is tricompartmental degenerative changes with subchondral sclerosis and peripheral osteophytes particularly in the medial lateral compartment.  Joint spaces are still maintained.  No malalignment or ectopic calcification    PMFS History: Patient Active Problem List   Diagnosis Date Noted  . Elevated blood pressure reading without diagnosis of hypertension 08/24/2018  . Allergic conjunctivitis of both eyes 08/24/2018  . Pain in left leg 08/24/2018  . Colon cancer screening 07/15/2018  . Poor venous access   . Chronic pain of right knee 05/27/2017  . Seasonal allergies 05/14/2017  . Iron deficiency anemia 04/15/2017  . Diverticulitis 04/14/2017  . Rectal bleeding 03/06/2017  . Dysphagia   . Menometrorrhagia 07/24/2016  . GERD (gastroesophageal reflux disease) 04/24/2016  . Helicobacter pylori gastritis 09/06/2014  . Diverticulitis of colon   . Morbid obesity ([place]) 04/27/2014   Past Medical History:  Diagnosis Date  . Arthritis   . Diverticulitis large intestine w/o perforation or abscess w/o bleeding 10/05/2014  . Diverticulosis   . GERD (gastroesophageal reflux disease)    history of H Pylori  . Helicobacter pylori gastritis 09/06/2014  . History of kidney stones   . Iron deficiency anemia   . Kidney stones     Family History  Problem Relation Age of Onset  . Heart failure Mother   . Hypertension Mother   . Heart disease Mother   . Cancer Other        mother's side of family, breast cancer  . Cancer Other        father's side of family, leukemia  . Hypertension Father   . Stroke Father 50  . Diabetes Other   . Hypertension Other   . Colon cancer Cousin   .  Healthy Brother   . Breast cancer Maternal Aunt 25  . Leukemia Paternal Aunt   . Kidney disease Maternal Grandmother   . Hypertension Maternal Grandmother   . Cancer Maternal Grandfather   . Leukemia Paternal Grandmother   . Cancer Paternal Grandfather     Past Surgical History:  Procedure Laterality Date  . CENTRAL VENOUS CATHETER INSERTION Right 06/02/2017   Procedure: INSERTION CENTRAL LINE RIGHT INTERNAL JUGULAR;  Surgeon: Virl Cagey, MD;  Location: [facility];  Service: General;  Laterality: Right;  . COLONOSCOPY N/A 06/10/2014  Dr. Oneida Alar; redundant sigmoid colon, moderate diverticulosis in the sigmoid and descending colon. small internal hemorrhoids. Next screening at age 11.   Marland Kitchen ESOPHAGOGASTRODUODENOSCOPY N/A 06/10/2014   Dr. Oneida Alar: H.pylori gastritis s/p treatment with Amoxicillin and Biaxin  . KNEE SURGERY Right   . PARTIAL COLECTOMY N/A 06/02/2017   Procedure: PARTIAL SIGMOID COLECTOMY (OPEN);  Surgeon: Virl Cagey, MD;  Location: [facility];  Service: General;  Laterality: N/A;  . renal calculi removal Left 2010  . SKIN LESION EXCISION     over right eyebrow due to wax being left above eye and it seeped down into pore   Social History   Occupational History  . Occupation: Engineer, petroleum, Warehouse manager for resource side  Tobacco Use  . Smoking status: Never Smoker  . Smokeless tobacco: Never Used  Vaping Use  . Vaping Use: Never used  Substance and Sexual Activity  . Alcohol use: No  . Drug use: No  . Sexual activity: Not on file

## 2019-09-30 ENCOUNTER — Ambulatory Visit: Payer: Self-pay | Admitting: Orthopaedic Surgery

## 2019-10-21 ENCOUNTER — Ambulatory Visit (INDEPENDENT_AMBULATORY_CARE_PROVIDER_SITE_OTHER): Payer: Self-pay | Admitting: Orthopaedic Surgery

## 2019-10-21 ENCOUNTER — Other Ambulatory Visit: Payer: Self-pay

## 2019-10-21 ENCOUNTER — Encounter: Payer: Self-pay | Admitting: Orthopaedic Surgery

## 2019-10-21 VITALS — Ht 70.0 in | Wt >= 6400 oz

## 2019-10-21 DIAGNOSIS — M1711 Unilateral primary osteoarthritis, right knee: Secondary | ICD-10-CM | POA: Insufficient documentation

## 2019-10-21 DIAGNOSIS — M79605 Pain in left leg: Secondary | ICD-10-CM

## 2019-10-21 DIAGNOSIS — S8012XD Contusion of left lower leg, subsequent encounter: Secondary | ICD-10-CM | POA: Insufficient documentation

## 2019-10-21 NOTE — Progress Notes (Signed)
Office Visit Note   Patient: Marilyn Rivera           Date of Birth: 1981/01/25           MRN: [id number] Visit Date: 10/21/2019              Requested by: [name], DO [place],  [place] [address] PCP: [name], DO   Assessment & Plan: Visit Diagnoses:  1. Pain in left leg   2. Contusion of calf, left, subsequent encounter   3. Osteoarthritis of right knee, unspecified osteoarthritis type     Plan:  #1: At this time are going to obtain a Doppler of the left leg to rule out a DVT #2: No further treatment of the right knee at this time.  Follow-Up Instructions: Return in about 4 weeks (around 11/18/2019).   Orders:  Orders Placed This Encounter  Procedures  . VAS [place] LOWER EXTREMITY VENOUS (DVT)   No orders of the defined types were placed in this encounter.     Procedures: No procedures performed   Clinical Data: No additional findings.   Subjective: Chief Complaint  Patient presents with  . Right Knee - Follow-up  . Left Leg - Follow-up   HPI Patient presents today for follow up on her left leg and right knee. She was here three weeks ago and received a right knee cortisone injection. She states that her right knee has improved some, but still not what it use to feel like. She is now 5 weeks out from her left leg injury. She states that she still has numbness in her leg. She feels a knot on the medial side of her left knee. She is not taking anything for pain.  Continue with numbness in the left calf area.  This is diffuse and not in any particular dermatome.   Review of Systems No change.    Objective: Vital Signs: Ht 5\' 10"  (1.778 m)   Wt (!) 412 lb (186.9 kg)   BMI 59.12 kg/m   Physical Exam Constitutional:      Appearance: Normal appearance. She is well-developed. She is obese.  HENT:     Head: Normocephalic.  Eyes:     Pupils: Pupils are equal, round, and reactive to light.  Pulmonary:     Effort: Pulmonary  effort is normal.  Skin:    General: Skin is warm and dry.  Neurological:     Mental Status: She is alert and oriented to person, place, and time.  Psychiatric:        Mood and Affect: Mood normal.        Behavior: Behavior normal.        Thought Content: Thought content normal.        Judgment: Judgment normal.     Ortho Exam  Exam today reveals the left calf to have a circumference at 20 cm distal to the inferior pole of the patella at 56.5 on the right 55.0.  She does have some pitting edema noted.  Motor is intact.  Sensation appears intact to light touch.  She has full motion of the ankle and foot.  Good strength also.  Still having some tenderness in the calf no.   Specialty Comments:  No specialty comments available.  Imaging: No results found.   PMFS History: Current Outpatient Medications  Medication Sig Dispense Refill  . aspirin EC 325 MG tablet Take 325 mg by mouth daily.    Marland Kitchen  cyclobenzaprine (FLEXERIL) 5 MG tablet Take 1 tablet (5 mg total) by mouth 3 (three) times daily as needed for muscle spasms. 30 tablet 0  . diclofenac (VOLTAREN) 75 MG EC tablet Take 1 tablet (75 mg total) by mouth 2 (two) times daily as needed for moderate pain. 90 tablet 1  . doxycycline (VIBRA-TABS) 100 MG tablet Take 1 tablet (100 mg total) by mouth 2 (two) times daily. 20 tablet 0  . fluticasone (FLONASE) 50 MCG/ACT nasal spray Place 2 sprays into both nostrils daily. 16 g 6  . montelukast (SINGULAIR) 10 MG tablet Take 1 tablet (10 mg total) by mouth at bedtime. 30 tablet 0  . omeprazole (PRILOSEC) 40 MG capsule Take 1 capsule (40 mg total) by mouth daily. (Needs to be seen before next refill) 30 capsule 0  . ondansetron (ZOFRAN-ODT) 4 MG disintegrating tablet Take 1 tablet (4 mg total) by mouth every 6 (six) hours as needed for nausea. 20 tablet 0   No current facility-administered medications for this visit.    Patient Active Problem List   Diagnosis Date Noted  . Contusion of calf,  left, subsequent encounter 10/21/2019  . Degenerative arthritis of right knee 10/21/2019  . Elevated blood pressure reading without diagnosis of hypertension 08/24/2018  . Allergic conjunctivitis of both eyes 08/24/2018  . Pain in left leg 08/24/2018  . Colon cancer screening 07/15/2018  . Poor venous access   . Chronic pain of right knee 05/27/2017  . Seasonal allergies 05/14/2017  . Iron deficiency anemia 04/15/2017  . Diverticulitis 04/14/2017  . Rectal bleeding 03/06/2017  . Dysphagia   . Menometrorrhagia 07/24/2016  . GERD (gastroesophageal reflux disease) 04/24/2016  . Helicobacter pylori gastritis 09/06/2014  . Diverticulitis of colon   . Morbid obesity ([place]) 04/27/2014   Past Medical History:  Diagnosis Date  . Arthritis   . Diverticulitis large intestine w/o perforation or abscess w/o bleeding 10/05/2014  . Diverticulosis   . GERD (gastroesophageal reflux disease)    history of H Pylori  . Helicobacter pylori gastritis 09/06/2014  . History of kidney stones   . Iron deficiency anemia   . Kidney stones     Family History  Problem Relation Age of Onset  . Heart failure Mother   . Hypertension Mother   . Heart disease Mother   . Cancer Other        mother's side of family, breast cancer  . Cancer Other        father's side of family, leukemia  . Hypertension Father   . Stroke Father 69  . Diabetes Other   . Hypertension Other   . Colon cancer Cousin   . Healthy Brother   . Breast cancer Maternal Aunt 74  . Leukemia Paternal Aunt   . Kidney disease Maternal Grandmother   . Hypertension Maternal Grandmother   . Cancer Maternal Grandfather   . Leukemia Paternal Grandmother   . Cancer Paternal Grandfather     Past Surgical History:  Procedure Laterality Date  . CENTRAL VENOUS CATHETER INSERTION Right 06/02/2017   Procedure: INSERTION CENTRAL LINE RIGHT INTERNAL JUGULAR;  Surgeon: Virl Cagey, MD;  Location: [facility];  Service: General;  Laterality: Right;    . COLONOSCOPY N/A 06/10/2014   Dr. Oneida Alar; redundant sigmoid colon, moderate diverticulosis in the sigmoid and descending colon. small internal hemorrhoids. Next screening at age 83.   Marland Kitchen ESOPHAGOGASTRODUODENOSCOPY N/A 06/10/2014   Dr. Oneida Alar: H.pylori gastritis s/p treatment with Amoxicillin and Biaxin  . KNEE SURGERY  Right   . PARTIAL COLECTOMY N/A 06/02/2017   Procedure: PARTIAL SIGMOID COLECTOMY (OPEN);  Surgeon: Virl Cagey, MD;  Location: [facility];  Service: General;  Laterality: N/A;  . renal calculi removal Left 2010  . SKIN LESION EXCISION     over right eyebrow due to wax being left above eye and it seeped down into pore   Social History   Occupational History  . Occupation: Engineer, petroleum, Warehouse manager for resource side  Tobacco Use  . Smoking status: Never Smoker  . Smokeless tobacco: Never Used  Vaping Use  . Vaping Use: Never used  Substance and Sexual Activity  . Alcohol use: No  . Drug use: No  . Sexual activity: Not on file

## 2019-10-22 ENCOUNTER — Encounter: Payer: Self-pay | Admitting: *Deleted

## 2019-10-22 ENCOUNTER — Encounter (HOSPITAL_COMMUNITY): Payer: Self-pay

## 2019-10-22 ENCOUNTER — Telehealth: Payer: Self-pay | Admitting: *Deleted

## 2019-10-22 NOTE — Telephone Encounter (Signed)
Pt is scheduled today for Ultrasound for DVT at [address] today 10/22/19 at 2:00p, I called and left vm for pt to return my call for appt information.

## 2019-10-22 NOTE — Telephone Encounter (Signed)
Pt called back and she is going to see if she can get off work to get the exam done and will give me a call to let me know.

## 2019-10-25 ENCOUNTER — Other Ambulatory Visit: Payer: Self-pay

## 2019-10-25 ENCOUNTER — Ambulatory Visit (HOSPITAL_COMMUNITY)
Admission: RE | Admit: 2019-10-25 | Discharge: 2019-10-25 | Disposition: A | Discharge status: Home / Self Care | Payer: Self-pay | Source: Ambulatory Visit | Attending: Orthopaedic Surgery | Admitting: Orthopaedic Surgery

## 2019-10-25 DIAGNOSIS — M79605 Pain in left leg: Secondary | ICD-10-CM | POA: Insufficient documentation

## 2019-10-25 NOTE — Progress Notes (Signed)
Venous duplex completed.   Called results to Dr. Deliah Boston, BS, RDMS, RVT

## 2019-11-15 ENCOUNTER — Telehealth: Payer: Self-pay

## 2019-11-15 ENCOUNTER — Encounter: Payer: Self-pay | Admitting: Orthopaedic Surgery

## 2019-11-15 NOTE — Telephone Encounter (Signed)
Tried to call patient about scheduling an appointment for her left leg. She messaged through the portal that something still wasn't right. No answer.  Left message for her to call back so that we can schedule her for tomorrow with Aaron Edelman.

## 2019-11-16 ENCOUNTER — Encounter: Payer: Self-pay | Admitting: Orthopaedic Surgery

## 2019-11-23 ENCOUNTER — Ambulatory Visit (INDEPENDENT_AMBULATORY_CARE_PROVIDER_SITE_OTHER): Payer: Self-pay | Admitting: Orthopaedic Surgery

## 2019-11-23 ENCOUNTER — Encounter: Payer: Self-pay | Admitting: Orthopaedic Surgery

## 2019-11-23 ENCOUNTER — Other Ambulatory Visit: Payer: Self-pay

## 2019-11-23 VITALS — Ht 70.0 in | Wt >= 6400 oz

## 2019-11-23 DIAGNOSIS — G8929 Other chronic pain: Secondary | ICD-10-CM

## 2019-11-23 DIAGNOSIS — S8002XA Contusion of left knee, initial encounter: Secondary | ICD-10-CM | POA: Insufficient documentation

## 2019-11-23 DIAGNOSIS — S8002XD Contusion of left knee, subsequent encounter: Secondary | ICD-10-CM

## 2019-11-23 DIAGNOSIS — M79605 Pain in left leg: Secondary | ICD-10-CM

## 2019-11-23 DIAGNOSIS — M25562 Pain in left knee: Secondary | ICD-10-CM

## 2019-11-23 NOTE — Progress Notes (Signed)
Office Visit Note   Patient: Marilyn Rivera           Date of Birth: 1981-01-19           MRN: [id number] Visit Date: 11/23/2019              Requested by: [name], DO [place],  [place] [address] PCP: [name], DO   Assessment & Plan: Visit Diagnoses:  1. Pain in left leg   2. Chronic pain of left knee   3. Contusion of left knee, subsequent encounter     Plan: Mrs. Marilyn Rivera had an acute injury to her left leg 2 months ago after she fell through a wooden deck.  She had considerable bruising in her left thigh knee and left leg.  She is localizing her discomfort along the medial aspect of her left knee.  Films have demonstrated significant arthritis of both knees.  She has had a cortisone injection on the right that helped.  Clinically the left knee is stable but there is induration medially which is probably a hematoma.  Because of the chronicity of her problem and the localized pain I like to obtain an MRI scan.  She has had a Doppler study that did not demonstrate any evidence of DVT.  She is also had some swelling of the ankle and foot since the injury neurologically intact  Follow-Up Instructions: Return After MRI scan left knee.   Orders:  Orders Placed This Encounter  Procedures  . MR Knee Left w/o contrast   No orders of the defined types were placed in this encounter.     Procedures: No procedures performed   Clinical Data: No additional findings.   Subjective: Chief Complaint  Patient presents with  . Left Leg - Pain, Follow-up  Patient presents today for left leg follow up. She injured her leg almost two months ago when she fell through a deck. She states that she has hard knot on the medial aspect of her knee. She still has numbness and tingling. Her right leg is still swollen. She takes Tylenol and Voltaren tablets.  She is frustrated and states that she is not getting better.  Has had a Doppler study that was negative for DVT.   Her pain is localized along the medial aspect of her knee.  Films have demonstrated degenerative changes in all 3 compartments.  No complaints of instability.  Has had some swelling of her ankle and foot since the injury  HPI  Review of Systems   Objective: Vital Signs: Ht 5\' 10"  (1.778 m)   Wt (!) 412 lb (186.9 kg)   BMI 59.12 kg/m   Physical Exam Constitutional:      Appearance: She is well-developed.  Eyes:     Pupils: Pupils are equal, round, and reactive to light.  Pulmonary:     Effort: Pulmonary effort is normal.  Skin:    General: Skin is warm and dry.  Neurological:     Mental Status: She is alert and oriented to person, place, and time.  Psychiatric:        Behavior: Behavior normal.     Ortho Exam large legs.  No obvious effusion of her left knee.  Full extension about 90 degrees of flexion when thigh touches calf.  No opening with varus valgus stress.  Negative anterior drawer sign.  There is some induration along the medial aspect of her knee that I believe is a hematoma.  Skin is intact.  No calf pain.  Mild ankle and foot nonpitting edema.  Neurologically intact Specialty Comments:  No specialty comments available.  Imaging: No results found.   PMFS History: Patient Active Problem List   Diagnosis Date Noted  . Contusion of knee, left 11/23/2019  . Contusion of calf, left, subsequent encounter 10/21/2019  . Degenerative arthritis of right knee 10/21/2019  . Elevated blood pressure reading without diagnosis of hypertension 08/24/2018  . Allergic conjunctivitis of both eyes 08/24/2018  . Pain in left leg 08/24/2018  . Colon cancer screening 07/15/2018  . Poor venous access   . Chronic pain of right knee 05/27/2017  . Seasonal allergies 05/14/2017  . Iron deficiency anemia 04/15/2017  . Diverticulitis 04/14/2017  . Rectal bleeding 03/06/2017  . Dysphagia   . Menometrorrhagia 07/24/2016  . GERD (gastroesophageal reflux disease) 04/24/2016  .  Helicobacter pylori gastritis 09/06/2014  . Diverticulitis of colon   . Morbid obesity ([place]) 04/27/2014   Past Medical History:  Diagnosis Date  . Arthritis   . Diverticulitis large intestine w/o perforation or abscess w/o bleeding 10/05/2014  . Diverticulosis   . GERD (gastroesophageal reflux disease)    history of H Pylori  . Helicobacter pylori gastritis 09/06/2014  . History of kidney stones   . Iron deficiency anemia   . Kidney stones     Family History  Problem Relation Age of Onset  . Heart failure Mother   . Hypertension Mother   . Heart disease Mother   . Cancer Other        mother's side of family, breast cancer  . Cancer Other        father's side of family, leukemia  . Hypertension Father   . Stroke Father 72  . Diabetes Other   . Hypertension Other   . Colon cancer Cousin   . Healthy Brother   . Breast cancer Maternal Aunt 97  . Leukemia Paternal Aunt   . Kidney disease Maternal Grandmother   . Hypertension Maternal Grandmother   . Cancer Maternal Grandfather   . Leukemia Paternal Grandmother   . Cancer Paternal Grandfather     Past Surgical History:  Procedure Laterality Date  . CENTRAL VENOUS CATHETER INSERTION Right 06/02/2017   Procedure: INSERTION CENTRAL LINE RIGHT INTERNAL JUGULAR;  Surgeon: Virl Cagey, MD;  Location: [facility];  Service: General;  Laterality: Right;  . COLONOSCOPY N/A 06/10/2014   Dr. Oneida Alar; redundant sigmoid colon, moderate diverticulosis in the sigmoid and descending colon. small internal hemorrhoids. Next screening at age 7.   Marland Kitchen ESOPHAGOGASTRODUODENOSCOPY N/A 06/10/2014   Dr. Oneida Alar: H.pylori gastritis s/p treatment with Amoxicillin and Biaxin  . KNEE SURGERY Right   . PARTIAL COLECTOMY N/A 06/02/2017   Procedure: PARTIAL SIGMOID COLECTOMY (OPEN);  Surgeon: Virl Cagey, MD;  Location: [facility];  Service: General;  Laterality: N/A;  . renal calculi removal Left 2010  . SKIN LESION EXCISION     over right eyebrow due to  wax being left above eye and it seeped down into pore   Social History   Occupational History  . Occupation: Engineer, petroleum, Warehouse manager for resource side  Tobacco Use  . Smoking status: Never Smoker  . Smokeless tobacco: Never Used  Vaping Use  . Vaping Use: Never used  Substance and Sexual Activity  . Alcohol use: No  . Drug use: No  . Sexual activity: Not on file

## 2019-11-24 ENCOUNTER — Ambulatory Visit: Payer: Self-pay | Admitting: Orthopaedic Surgery

## 2019-11-26 ENCOUNTER — Telehealth: Payer: Self-pay | Admitting: Orthopaedic Surgery

## 2019-11-26 NOTE — Telephone Encounter (Signed)
Called 1x and left a message for patient to schedule MRI review for Dr. Durward Fortes.

## 2019-12-24 ENCOUNTER — Ambulatory Visit
Admission: RE | Admit: 2019-12-24 | Discharge: 2019-12-24 | Disposition: A | Discharge status: Home / Self Care | Payer: Self-pay | Source: Ambulatory Visit | Attending: Orthopaedic Surgery | Admitting: Orthopaedic Surgery

## 2019-12-24 ENCOUNTER — Other Ambulatory Visit: Payer: Self-pay

## 2019-12-24 DIAGNOSIS — M25562 Pain in left knee: Secondary | ICD-10-CM

## 2019-12-24 DIAGNOSIS — G8929 Other chronic pain: Secondary | ICD-10-CM

## 2019-12-24 DIAGNOSIS — M79605 Pain in left leg: Secondary | ICD-10-CM

## 2019-12-25 ENCOUNTER — Encounter: Payer: Self-pay | Admitting: Orthopaedic Surgery

## 2019-12-28 ENCOUNTER — Encounter: Payer: Self-pay | Admitting: Orthopaedic Surgery

## 2019-12-28 ENCOUNTER — Ambulatory Visit: Payer: Self-pay | Admitting: Orthopaedic Surgery

## 2020-01-11 ENCOUNTER — Other Ambulatory Visit: Payer: Self-pay

## 2020-01-11 DIAGNOSIS — G8929 Other chronic pain: Secondary | ICD-10-CM

## 2020-01-11 MED ORDER — DICLOFENAC SODIUM 75 MG PO TBEC: 75.0000 mg | DELAYED_RELEASE_TABLET | Freq: Two times a day (BID) | ORAL | 1 refills | Status: DC | PRN

## 2020-04-25 ENCOUNTER — Ambulatory Visit: Payer: Managed Care, Other (non HMO) | Admitting: Family Medicine

## 2020-04-25 ENCOUNTER — Encounter: Payer: Self-pay | Admitting: Family Medicine

## 2020-04-25 DIAGNOSIS — J0110 Acute frontal sinusitis, unspecified: Secondary | ICD-10-CM

## 2020-04-25 DIAGNOSIS — J302 Other seasonal allergic rhinitis: Secondary | ICD-10-CM | POA: Diagnosis not present

## 2020-04-25 MED ORDER — AMOXICILLIN-POT CLAVULANATE 875-125 MG PO TABS: 1.0000 | ORAL_TABLET | Freq: Two times a day (BID) | ORAL | 0 refills | Status: DC

## 2020-04-25 MED ORDER — FLUTICASONE PROPIONATE 50 MCG/ACT NA SUSP: 2.0000 | Freq: Every day | NASAL | 6 refills | Status: DC

## 2020-04-25 NOTE — Progress Notes (Signed)
Virtual Visit via telephone Note  I connected with Marilyn Rivera on 04/25/20 at 1314 by telephone and verified that I am speaking with the correct person using two identifiers. Marilyn Rivera is currently located at home and patient are currently with her during visit. The provider, Fransisca Kaufmann Alberta Cairns, MD is located in their office at time of visit.  Call ended at 1323  I discussed the limitations, risks, security and privacy concerns of performing an evaluation and management service by telephone and the availability of in person appointments. I also discussed with the patient that there may be a patient responsible charge related to this service. The patient expressed understanding and agreed to proceed.   History and Present Illness: Patient is calling in for sinus issues.  She is taking alka seltzer and tylenol sinus.  She gets allergies but this time it is not clearing.  She has drainage and pressure in head and ear. Her left side is plugged up.  She denies any fevers or chills or SOB or wheezing.  She had covid 1 month ago. She is coughing some at night but not too much.  This has been going on for 5 days.   1. Acute non-recurrent frontal sinusitis   2. Seasonal allergies     Outpatient Encounter Medications as of 04/25/2020  Medication Sig  . amoxicillin-clavulanate (AUGMENTIN) 875-125 MG tablet Take 1 tablet by mouth 2 (two) times daily.  Marland Kitchen aspirin EC 325 MG tablet Take 325 mg by mouth daily.  . cyclobenzaprine (FLEXERIL) 5 MG tablet Take 1 tablet (5 mg total) by mouth 3 (three) times daily as needed for muscle spasms.  . diclofenac (VOLTAREN) 75 MG EC tablet Take 1 tablet (75 mg total) by mouth 2 (two) times daily as needed for moderate pain.  . fluticasone (FLONASE) 50 MCG/ACT nasal spray Place 2 sprays into both nostrils daily.  . montelukast (SINGULAIR) 10 MG tablet Take 1 tablet (10 mg total) by mouth at bedtime.  Marland Kitchen omeprazole (PRILOSEC) 40 MG capsule Take 1 capsule (40 mg  total) by mouth daily. (Needs to be seen before next refill)  . ondansetron (ZOFRAN-ODT) 4 MG disintegrating tablet Take 1 tablet (4 mg total) by mouth every 6 (six) hours as needed for nausea.  . [DISCONTINUED] doxycycline (VIBRA-TABS) 100 MG tablet Take 1 tablet (100 mg total) by mouth 2 (two) times daily.  . [DISCONTINUED] fluticasone (FLONASE) 50 MCG/ACT nasal spray Place 2 sprays into both nostrils daily.   No facility-administered encounter medications on file as of 04/25/2020.    Review of Systems  Constitutional: Negative for chills and fever.  HENT: Positive for congestion, postnasal drip, rhinorrhea and sinus pressure. Negative for ear discharge, ear pain, sneezing and sore throat.   Eyes: Negative for pain, redness and visual disturbance.  Respiratory: Positive for cough. Negative for chest tightness and shortness of breath.   Cardiovascular: Negative for chest pain and leg swelling.  Genitourinary: Negative for difficulty urinating and dysuria.  Musculoskeletal: Negative for back pain and gait problem.  Skin: Negative for rash.  Neurological: Negative for light-headedness and headaches.  Psychiatric/Behavioral: Negative for agitation and behavioral problems.  All other systems reviewed and are negative.   Observations/Objective: Patient sounds comfortable and in no acute distress  Assessment and Plan: Problem List Items Addressed This Visit      Other   Seasonal allergies   Relevant Medications   fluticasone (FLONASE) 50 MCG/ACT nasal spray    Other Visit Diagnoses  Acute non-recurrent frontal sinusitis    -  Primary   Relevant Medications   amoxicillin-clavulanate (AUGMENTIN) 875-125 MG tablet   fluticasone (FLONASE) 50 MCG/ACT nasal spray       Follow up plan: No follow-ups on file.     I discussed the assessment and treatment plan with the patient. The patient was provided an opportunity to ask questions and all were answered. The patient agreed with the  plan and demonstrated an understanding of the instructions.   The patient was advised to call back or seek an in-person evaluation if the symptoms worsen or if the condition fails to improve as anticipated.  The above assessment and management plan was discussed with the patient. The patient verbalized understanding of and has agreed to the management plan. Patient is aware to call the clinic if symptoms persist or worsen. Patient is aware when to return to the clinic for a follow-up visit. Patient educated on when it is appropriate to go to the emergency department.    I provided 9 minutes of non-face-to-face time during this encounter.    Worthy Rancher, MD

## 2020-05-24 ENCOUNTER — Ambulatory Visit: Payer: Managed Care, Other (non HMO) | Admitting: Family Medicine

## 2020-05-24 ENCOUNTER — Other Ambulatory Visit: Payer: Self-pay

## 2020-05-24 ENCOUNTER — Telehealth: Payer: Self-pay

## 2020-05-24 ENCOUNTER — Encounter: Payer: Self-pay | Admitting: Family Medicine

## 2020-05-24 VITALS — BP 158/91 | HR 87 | Temp 97.9°F | Ht 70.0 in | Wt >= 6400 oz

## 2020-05-24 DIAGNOSIS — G8929 Other chronic pain: Secondary | ICD-10-CM | POA: Diagnosis not present

## 2020-05-24 DIAGNOSIS — M25562 Pain in left knee: Secondary | ICD-10-CM

## 2020-05-24 DIAGNOSIS — M25561 Pain in right knee: Secondary | ICD-10-CM | POA: Diagnosis not present

## 2020-05-24 DIAGNOSIS — Z23 Encounter for immunization: Secondary | ICD-10-CM | POA: Diagnosis not present

## 2020-05-24 MED ORDER — PREDNISONE 20 MG PO TABS: 20.0000 mg | ORAL_TABLET | Freq: Every day | ORAL | 0 refills | Status: AC

## 2020-05-24 NOTE — Progress Notes (Signed)
Acute Office Visit  Subjective:    Patient ID: Marilyn Rivera, female    DOB: 05/23/1980, 40 y.o.   MRN: [id number]  Chief Complaint  Patient presents with  . Knee Pain    HPI Patient is in today for chronic bilateral knee pain. She has arthritis. Pain is worse with activity and is 7-9/10 at its worse. Pain is mild at rest. The pain is also worse with stairs. She has been more active lately and so her pain has been worse for the las 2 months. She takes diclofenac with little improvement. She used to see ortho but has not recently. She gets knee injections when she is abe with good relief. She plans to do this next week with her PCP. She denies injury.    Past Medical History:  Diagnosis Date  . Arthritis   . Diverticulitis large intestine w/o perforation or abscess w/o bleeding 10/05/2014  . Diverticulosis   . GERD (gastroesophageal reflux disease)    history of H Pylori  . Helicobacter pylori gastritis 09/06/2014  . History of kidney stones   . Iron deficiency anemia   . Kidney stones     Past Surgical History:  Procedure Laterality Date  . CENTRAL VENOUS CATHETER INSERTION Right 06/02/2017   Procedure: INSERTION CENTRAL LINE RIGHT INTERNAL JUGULAR;  Surgeon: Virl Cagey, MD;  Location: [facility];  Service: General;  Laterality: Right;  . COLONOSCOPY N/A 06/10/2014   Dr. Oneida Alar; redundant sigmoid colon, moderate diverticulosis in the sigmoid and descending colon. small internal hemorrhoids. Next screening at age 19.   Marland Kitchen ESOPHAGOGASTRODUODENOSCOPY N/A 06/10/2014   Dr. Oneida Alar: H.pylori gastritis s/p treatment with Amoxicillin and Biaxin  . KNEE SURGERY Right   . PARTIAL COLECTOMY N/A 06/02/2017   Procedure: PARTIAL SIGMOID COLECTOMY (OPEN);  Surgeon: Virl Cagey, MD;  Location: [facility];  Service: General;  Laterality: N/A;  . renal calculi removal Left 2010  . SKIN LESION EXCISION     over right eyebrow due to wax being left above eye and it seeped down into pore     Family History  Problem Relation Age of Onset  . Heart failure Mother   . Hypertension Mother   . Heart disease Mother   . Cancer Other        mother's side of family, breast cancer  . Cancer Other        father's side of family, leukemia  . Hypertension Father   . Stroke Father 44  . Diabetes Other   . Hypertension Other   . Colon cancer Cousin   . Healthy Brother   . Breast cancer Maternal Aunt 78  . Leukemia Paternal Aunt   . Kidney disease Maternal Grandmother   . Hypertension Maternal Grandmother   . Cancer Maternal Grandfather   . Leukemia Paternal Grandmother   . Cancer Paternal Grandfather     Social History   Socioeconomic History  . Marital status: Legally Separated    Spouse name: Not on file  . Number of children: 0  . Years of education: Not on file  . Highest education level: Not on file  Occupational History  . Occupation: Engineer, petroleum, Warehouse manager for resource side  Tobacco Use  . Smoking status: Never Smoker  . Smokeless tobacco: Never Used  Vaping Use  . Vaping Use: Never used  Substance and Sexual Activity  . Alcohol use: No  . Drug use: No  . Sexual activity: Not on file  Other Topics Concern  .  Not on file  Social History Narrative  . Not on file   Social Determinants of Health   Financial Resource Strain: Not on file  Food Insecurity: Not on file  Transportation Needs: Not on file  Physical Activity: Not on file  Stress: Not on file  Social Connections: Not on file  Intimate Partner Violence: Not on file    Outpatient Medications Prior to Visit  Medication Sig Dispense Refill  . fluticasone (FLONASE) 50 MCG/ACT nasal spray Place 2 sprays into both nostrils daily. 16 g 6  . omeprazole (PRILOSEC) 40 MG capsule Take 1 capsule (40 mg total) by mouth daily. (Needs to be seen before next refill) 30 capsule 0  . amoxicillin-clavulanate (AUGMENTIN) 875-125 MG tablet Take 1 tablet by mouth 2 (two) times daily. 20 tablet 0  . aspirin EC  325 MG tablet Take 325 mg by mouth daily.    . cyclobenzaprine (FLEXERIL) 5 MG tablet Take 1 tablet (5 mg total) by mouth 3 (three) times daily as needed for muscle spasms. 30 tablet 0  . diclofenac (VOLTAREN) 75 MG EC tablet Take 1 tablet (75 mg total) by mouth 2 (two) times daily as needed for moderate pain. 90 tablet 1  . montelukast (SINGULAIR) 10 MG tablet Take 1 tablet (10 mg total) by mouth at bedtime. 30 tablet 0  . ondansetron (ZOFRAN-ODT) 4 MG disintegrating tablet Take 1 tablet (4 mg total) by mouth every 6 (six) hours as needed for nausea. 20 tablet 0   No facility-administered medications prior to visit.    Allergies  Allergen Reactions  . Keflex [Cephalexin]     Yeast infections   . Coconut Fatty Acids Itching and Rash    Review of Systems As per HPI.     Objective:    Physical Exam Vitals and nursing note reviewed.  Constitutional:      General: She is not in acute distress.    Appearance: She is not ill-appearing, toxic-appearing or diaphoretic.  Cardiovascular:     Rate and Rhythm: Normal rate and regular rhythm.     Heart sounds: Normal heart sounds. No murmur heard.   Pulmonary:     Effort: Pulmonary effort is normal. No respiratory distress.     Breath sounds: Normal breath sounds.  Musculoskeletal:     Right knee: No swelling, deformity, effusion, erythema or bony tenderness. Tenderness (generalized) present.     Left knee: No swelling, deformity, effusion, erythema or bony tenderness. Tenderness (generalized) present.     Right lower leg: No edema.     Left lower leg: No edema.  Neurological:     General: No focal deficit present.     Mental Status: She is alert and oriented to person, place, and time.     BP (!) 158/91   Pulse 87   Temp 97.9 F (36.6 C) (Temporal)   Ht 5\' 10"  (1.778 m)   Wt (!) 433 lb 8 oz (196.6 kg)   BMI 62.20 kg/m  Wt Readings from Last 3 Encounters:  05/24/20 (!) 433 lb 8 oz (196.6 kg)  11/23/19 (!) 412 lb (186.9 kg)   10/21/19 (!) 412 lb (186.9 kg)    Health Maintenance Due  Topic Date Due  . Hepatitis C Screening  Never done  . TETANUS/TDAP  Never done  . PAP SMEAR-Modifier  07/25/2019    There are no preventive care reminders to display for this patient.   Lab Results  Component Value Date   TSH 2.010 05/14/2017  Lab Results  Component Value Date   WBC 8.7 03/19/2018   HGB 11.1 03/19/2018   HCT 36.7 03/19/2018   MCV 77 (L) 03/19/2018   PLT 280 03/19/2018   Lab Results  Component Value Date   NA 140 07/27/2019   K 4.2 07/27/2019   CO2 27 07/27/2019   GLUCOSE 100 (H) 07/27/2019   BUN 12 07/27/2019   CREATININE 0.79 07/27/2019   BILITOT <0.2 05/14/2017   ALKPHOS 62 05/14/2017   AST 18 05/14/2017   ALT 15 05/14/2017   PROT 7.1 05/14/2017   ALBUMIN 3.6 05/14/2017   CALCIUM 8.8 07/27/2019   ANIONGAP 11 06/05/2017   Lab Results  Component Value Date   CHOL 125 05/14/2017   Lab Results  Component Value Date   HDL 36 (L) 05/14/2017   Lab Results  Component Value Date   LDLCALC 75 05/14/2017   Lab Results  Component Value Date   TRIG 72 05/14/2017   Lab Results  Component Value Date   CHOLHDL 3.5 05/14/2017   Lab Results  Component Value Date   HGBA1C 6.0 03/19/2018       Assessment & Plan:   Kaidan was seen today for knee pain.  Diagnoses and all orders for this visit:  Bilateral chronic knee pain Prednisone burst as below. Discussed weight loss to help with knee pain. She plans to start working with a Physiological scientist again. She is also working to cut down on her soda intake.  -     predniSONE (DELTASONE) 20 MG tablet; Take 1 tablet (20 mg total) by mouth daily with breakfast for 5 days.  Tdap vaccine today in office.   Return to office for new or worsening symptoms. Keep scheduled appointment with PCP.   The patient indicates understanding of these issues and agrees with the plan.  Gwenlyn Perking, FNP

## 2020-05-24 NOTE — Patient Instructions (Signed)
Chronic Knee Pain, Adult Chronic knee pain is pain in one or both knees that lasts longer than 3 months. Symptoms of chronic knee pain may include swelling, stiffness, and discomfort. Age-related wear and tear (osteoarthritis) of the knee joint is the most common cause of chronic knee pain. Other possible causes include:  A long-term immune-related disease that causes inflammation of the knee (rheumatoid arthritis). This usually affects both knees.  Inflammatory arthritis, such as gout or pseudogout.  An injury to the knee that causes arthritis.  An injury to the knee that damages the ligaments. Ligaments are strong tissues that connect bones to each other.  Runner's knee or pain behind the kneecap. Treatment for chronic knee pain depends on the cause. The main treatments for chronic knee pain are physical therapy and weight loss. This condition may also be treated with medicines, injections, a knee sleeve or brace, and by using crutches. Rest, ice, pressure (compression), and elevation, also known as RICE therapy, may also be recommended. Follow these instructions at home: If you have a knee sleeve or brace:  Wear the knee sleeve or brace as told by your health care provider. Remove it only as told by your health care provider.  Loosen it if your toes tingle, become numb, or turn cold and blue.  Keep it clean.  If the sleeve or brace is not waterproof: ? Do not let it get wet. ? Remove it if allowed by your health care provider, or cover it with a watertight covering when you take a bath or a shower.   Managing pain, stiffness, and swelling  If directed, apply heat to the affected area as often as told by your health care provider. Use the heat source that your health care provider recommends, such as a moist heat pack or a heating pad. ? If you have a removable knee sleeve or brace, remove it as told by your health care provider. ? Place a towel between your skin and the heat  source. ? Leave the heat on for 20-30 minutes. ? Remove the heat if your skin turns bright red. This is especially important if you are unable to feel pain, heat, or cold. You may have a greater risk of getting burned.  If directed, put ice on the affected area. To do this: ? If you have a removable knee sleeve or brace, remove it as told by your health care provider. ? Put ice in a plastic bag. ? Place a towel between your skin and the bag. ? Leave the ice on for 20 minutes, 2-3 times a day. ? Remove the ice if your skin turns bright red. This is very important. If you cannot feel pain, heat, or cold, you have a greater risk of damage to the area.  Move your toes often to reduce stiffness and swelling.  Raise (elevate) the injured area above the level of your heart while you are sitting or lying down.      Activity  Avoid high-impact activities or exercises, such as running, jumping rope, or doing jumping jacks.  Follow the exercise plan that your health care provider designed for you. Your health care provider may suggest that you: ? Avoid activities that make knee pain worse. This may require you to change your exercise routines, sport participation, or job duties. ? Wear shoes with cushioned soles. ? Avoid sports that require running and sudden changes in direction. ? Do physical therapy. Physical therapy is planned to match your needs and abilities.   It may include exercises for strength, flexibility, stability, and endurance. ? Do exercises that increase balance and strength, such as tai chi and yoga.  Do not use the injured limb to support your body weight until your health care provider says that you can. Use crutches as told by your health care provider.  Return to your normal activities as told by your health care provider. Ask your health care provider what activities are safe for you. General instructions  Take over-the-counter and prescription medicines only as told by  your health care provider.  Lose weight if you are overweight. Losing even a little weight can reduce knee pain. Ask your health care provider what your ideal weight is, and how to safely lose extra weight. A dietitian may be able to help you plan your meals.  Do not use any products that contain nicotine or tobacco, such as cigarettes, e-cigarettes, and chewing tobacco. These can delay healing. If you need help quitting, ask your health care provider.  Keep all follow-up visits. This is important. Contact a health care provider if:  You have knee pain that is not getting better or gets worse.  You are unable to do your physical therapy exercises due to knee pain. Get help right away if:  Your knee swells and the swelling becomes worse.  You cannot move your knee.  You have severe knee pain. Summary  Knee pain that lasts more than 3 months is considered chronic knee pain.  The main treatments for chronic knee pain are physical therapy and weight loss. You may also need to take medicines, wear a knee sleeve or brace, use crutches, and apply ice or heat.  Losing even a little weight can reduce knee pain. Ask your health care provider what your ideal weight is, and how to safely lose extra weight. A dietitian may be able to help you plan your meals.  Follow the exercise plan that your health care provider designed for you. This information is not intended to replace advice given to you by your health care provider. Make sure you discuss any questions you have with your health care provider. Document Revised: 07/28/2019 Document Reviewed: 07/28/2019 Elsevier Patient Education  2021 [employer]  

## 2020-05-24 NOTE — Telephone Encounter (Signed)
Spoke with patient and made her an appt with Dr Darnell Level next week. She wants to keep the appt today with Tiffany for an acute visit for knee pain

## 2020-05-24 NOTE — Addendum Note (Signed)
Addended by: Milas Hock on: 05/24/2020 05:03 PM   Modules accepted: Orders

## 2020-05-31 ENCOUNTER — Ambulatory Visit: Payer: Managed Care, Other (non HMO) | Admitting: Family Medicine

## 2020-05-31 ENCOUNTER — Other Ambulatory Visit: Payer: Self-pay | Admitting: *Deleted

## 2020-05-31 DIAGNOSIS — G8929 Other chronic pain: Secondary | ICD-10-CM

## 2020-05-31 MED ORDER — DICLOFENAC SODIUM 75 MG PO TBEC: 75.0000 mg | DELAYED_RELEASE_TABLET | Freq: Two times a day (BID) | ORAL | 0 refills | Status: DC | PRN

## 2020-06-08 ENCOUNTER — Telehealth: Payer: Self-pay

## 2020-06-08 ENCOUNTER — Encounter: Payer: Self-pay | Admitting: Family Medicine

## 2020-06-08 ENCOUNTER — Ambulatory Visit: Payer: Managed Care, Other (non HMO) | Admitting: Family Medicine

## 2020-06-08 ENCOUNTER — Other Ambulatory Visit: Payer: Self-pay | Admitting: Family Medicine

## 2020-06-08 DIAGNOSIS — K5792 Diverticulitis of intestine, part unspecified, without perforation or abscess without bleeding: Secondary | ICD-10-CM | POA: Diagnosis not present

## 2020-06-08 MED ORDER — METRONIDAZOLE 500 MG PO TABS: 500.0000 mg | ORAL_TABLET | Freq: Two times a day (BID) | ORAL | 0 refills | Status: DC

## 2020-06-08 MED ORDER — CIPROFLOXACIN HCL 500 MG PO TABS: 500.0000 mg | ORAL_TABLET | Freq: Two times a day (BID) | ORAL | 0 refills | Status: DC

## 2020-06-08 NOTE — Progress Notes (Signed)
Virtual Visit via telephone Note  I connected with Marilyn Rivera on 06/08/20 at 1247 by telephone and verified that I am speaking with the correct person using two identifiers. Marilyn Rivera is currently located at work and patient are currently with her during visit. The provider, Fransisca Kaufmann Illana Nolting, MD is located in their office at time of visit.  Call ended at 1252  I discussed the limitations, risks, security and privacy concerns of performing an evaluation and management service by telephone and the availability of in person appointments. I also discussed with the patient that there may be a patient responsible charge related to this service. The patient expressed understanding and agreed to proceed.   History and Present Illness: Patient is calling in for right lower abdominal pain and diarrhea.  She denies fevers or chills.  Nausea is constant since yesterday since pain started.  She denies radiation or urinary symptoms.    1. Diverticulitis     Outpatient Encounter Medications as of 06/08/2020  Medication Sig  . ciprofloxacin (CIPRO) 500 MG tablet Take 1 tablet (500 mg total) by mouth 2 (two) times daily.  . metroNIDAZOLE (FLAGYL) 500 MG tablet Take 1 tablet (500 mg total) by mouth 2 (two) times daily.  . diclofenac (VOLTAREN) 75 MG EC tablet Take 1 tablet (75 mg total) by mouth 2 (two) times daily as needed for moderate pain.  . fluticasone (FLONASE) 50 MCG/ACT nasal spray Place 2 sprays into both nostrils daily.  Marland Kitchen omeprazole (PRILOSEC) 40 MG capsule TAKE (1) CAPSULE DAILY. NEED TO BE SEEN FOR APPOINTMENT   No facility-administered encounter medications on file as of 06/08/2020.    Review of Systems  Constitutional: Negative for chills and fever.  Eyes: Negative for visual disturbance.  Respiratory: Negative for chest tightness and shortness of breath.   Cardiovascular: Negative for chest pain and leg swelling.  Gastrointestinal: Positive for abdominal pain, diarrhea  and nausea. Negative for blood in stool, constipation and vomiting.  Genitourinary: Negative for difficulty urinating, dysuria, flank pain, frequency, urgency, vaginal bleeding, vaginal discharge and vaginal pain.  Musculoskeletal: Negative for back pain and gait problem.  Skin: Negative for rash.  Neurological: Negative for light-headedness and headaches.  Psychiatric/Behavioral: Negative for agitation and behavioral problems.  All other systems reviewed and are negative.   Observations/Objective: Patient sounds comfortable and in no acute distress  Assessment and Plan: Problem List Items Addressed This Visit      Other   Diverticulitis - Primary   Relevant Medications   ciprofloxacin (CIPRO) 500 MG tablet   metroNIDAZOLE (FLAGYL) 500 MG tablet      Sounds like diverticulitis based on what she described except for his right lower, gave her warning that if anything worsens that she needs to go to the emergency department but sent in the antibiotics for treatment for possible diverticulitis. Follow up plan: Return if symptoms worsen or fail to improve.     I discussed the assessment and treatment plan with the patient. The patient was provided an opportunity to ask questions and all were answered. The patient agreed with the plan and demonstrated an understanding of the instructions.   The patient was advised to call back or seek an in-person evaluation if the symptoms worsen or if the condition fails to improve as anticipated.  The above assessment and management plan was discussed with the patient. The patient verbalized understanding of and has agreed to the management plan. Patient is aware to call the clinic if symptoms  persist or worsen. Patient is aware when to return to the clinic for a follow-up visit. Patient educated on when it is appropriate to go to the emergency department.    I provided 5 minutes of non-face-to-face time during this encounter.    Worthy Rancher, MD

## 2020-06-08 NOTE — Telephone Encounter (Signed)
appt changed from MON to to the following week for knee injections.  Pt also c/o diverticulitis  - would like [facility] called in - aware needs appt   appt made

## 2020-06-12 ENCOUNTER — Encounter: Payer: Self-pay | Admitting: Family Medicine

## 2020-06-12 ENCOUNTER — Ambulatory Visit (INDEPENDENT_AMBULATORY_CARE_PROVIDER_SITE_OTHER): Payer: Managed Care, Other (non HMO) | Admitting: Family Medicine

## 2020-06-12 DIAGNOSIS — M25562 Pain in left knee: Secondary | ICD-10-CM

## 2020-06-12 DIAGNOSIS — G8929 Other chronic pain: Secondary | ICD-10-CM

## 2020-06-12 DIAGNOSIS — M25561 Pain in right knee: Secondary | ICD-10-CM

## 2020-06-12 NOTE — Progress Notes (Signed)
Erroneous encounter

## 2020-06-12 NOTE — Patient Instructions (Signed)
Schedule your yearly physical with fasting labs.  You are due a pap smear as well.  Knee Injection A knee injection is a procedure to get medicine into your knee joint to relieve the pain, swelling, and stiffness of arthritis. Your health care provider uses a needle to inject medicine, which may also help to lubricate and cushion your knee joint. You may need more than one injection. Tell a health care provider about:  Any allergies you have.  All medicines you are taking, including vitamins, herbs, eye drops, creams, and over-the-counter medicines.  Any problems you or family members have had with anesthetic medicines.  Any blood disorders you have.  Any surgeries you have had.  Any medical conditions you have.  Whether you are pregnant or may be pregnant. What are the risks? Generally, this is a safe procedure. However, problems may occur, including:  Infection.  Bleeding.  Symptoms that get worse.  Damage to the area around your knee.  Allergic reaction to any of the medicines.  Skin reactions from repeated injections. What happens before the procedure?  Ask your health care provider about: ? Changing or stopping your regular medicines. This is especially important if you are taking diabetes medicines or blood thinners. ? Taking medicines such as aspirin and ibuprofen. These medicines can thin your blood. Do not take these medicines unless your health care provider tells you to take them. ? Taking over-the-counter medicines, vitamins, herbs, and supplements.  Plan to have a responsible adult take you home from the hospital or clinic. What happens during the procedure?  You will sit or lie down in a position for your knee to be treated.  The skin over your kneecap will be cleaned with a germ-killing soap.  You will be given a medicine that numbs the area (local anesthetic). You may feel some stinging.  The medicine will be injected into your knee. The needle is  carefully placed between your kneecap and your knee. The medicine is injected into the joint space.  The needle will be removed at the end of the procedure.  A bandage (dressing) may be placed over the injection site. The procedure may vary among health care providers and hospitals.   What can I expect after the procedure?  Your blood pressure, heart rate, breathing rate, and blood oxygen level will be monitored until you leave the hospital or clinic.  You may have to move your knee through its full range of motion. This helps to get all the medicine into your joint space.  You will be watched to make sure that you do not have a reaction to the injected medicine.  You may feel more pain, swelling, and warmth than you did before the injection. This reaction may last about 1-2 days. Follow these instructions at home: Medicines  Take over-the-counter and prescription medicines only as told by your health care provider.  Ask your health care provider if the medicine prescribed to you requires you to avoid driving or using machinery.  Do not take medicines such as aspirin and ibuprofen unless your health care provider tells you to take them. Injection site care  Follow instructions from your health care provider about: ? How to take care of your puncture site. ? When and how you should change your dressing. ? When you should remove your dressing.  Check your injection area every day for signs of infection. Check for: ? More redness, swelling, or pain after 2 days. ? Fluid or blood. ? Pus  or a bad smell. ? Warmth. Managing pain, stiffness, and swelling  If directed, put ice on the injection area. To do this: ? Put ice in a plastic bag. ? Place a towel between your skin and the bag. ? Leave the ice on for 20 minutes, 2-3 times per day. ? Remove the ice if your skin turns bright red. This is very important. If you cannot feel pain, heat, or cold, you have a greater risk of damage to  the area.  Do not apply heat to your knee.  Raise (elevate) the injection area above the level of your heart while you are sitting or lying down.   General instructions  If you were given a dressing, keep it dry until your health care provider says it can be removed. Ask your health care provider when you can start showering or bathing.  Avoid strenuous activities for as long as directed by your health care provider. Ask your health care provider when you can return to your normal activities.  Keep all follow-up visits. This is important. You may need more injections. Contact a health care provider if you have:  A fever.  Warmth in your injection area.  Fluid, blood, or pus coming from your injection site.  Symptoms at your injection site that last longer than 2 days after your procedure. Get help right away if:  Your knee turns very red.  Your knee becomes very swollen.  Your knee is in severe pain. Summary  A knee injection is a procedure to get medicine into your knee joint to relieve the pain, swelling, and stiffness of arthritis.  A needle is carefully placed between your kneecap and your knee to inject medicine into the joint space.  Before the procedure, ask your health care provider about changing or stopping your regular medicines, especially if you are taking diabetes medicines or blood thinners.  Contact your health care provider if you have any problems or questions after your procedure. This information is not intended to replace advice given to you by your health care provider. Make sure you discuss any questions you have with your health care provider. Document Revised: 07/28/2019 Document Reviewed: 07/28/2019 Elsevier Patient Education  2021 [employer].

## 2020-06-21 ENCOUNTER — Encounter: Payer: Self-pay | Admitting: Family Medicine

## 2020-06-21 ENCOUNTER — Ambulatory Visit: Payer: Managed Care, Other (non HMO) | Admitting: Family Medicine

## 2020-06-21 ENCOUNTER — Other Ambulatory Visit: Payer: Self-pay

## 2020-06-21 VITALS — BP 140/86 | HR 96 | Temp 98.1°F | Ht 70.0 in | Wt >= 6400 oz

## 2020-06-21 DIAGNOSIS — M25562 Pain in left knee: Secondary | ICD-10-CM | POA: Diagnosis not present

## 2020-06-21 DIAGNOSIS — G8929 Other chronic pain: Secondary | ICD-10-CM

## 2020-06-21 DIAGNOSIS — I10 Essential (primary) hypertension: Secondary | ICD-10-CM | POA: Diagnosis not present

## 2020-06-21 DIAGNOSIS — M25561 Pain in right knee: Secondary | ICD-10-CM

## 2020-06-21 MED ORDER — METHYLPREDNISOLONE ACETATE 40 MG/ML IJ SUSP
40.0000 mg | Freq: Once | INTRAMUSCULAR | Status: AC
Start: 2020-06-21 — End: 2020-06-21
  Administered 2020-06-21: 40 mg via INTRA_ARTICULAR

## 2020-06-21 MED ORDER — METHYLPREDNISOLONE ACETATE 40 MG/ML IJ SUSP
40.0000 mg | Freq: Once | INTRAMUSCULAR | Status: AC
  Administered 2020-06-21: 40 mg via INTRA_ARTICULAR

## 2020-06-21 MED ORDER — AMLODIPINE BESYLATE 5 MG PO TABS
5.0000 mg | ORAL_TABLET | Freq: Every day | ORAL | 3 refills | Status: DC
Start: 2020-06-21 — End: 2021-03-29

## 2020-06-21 MED ORDER — MONTELUKAST SODIUM 10 MG PO TABS: 10.0000 mg | ORAL_TABLET | Freq: Every day | ORAL | 3 refills | Status: DC

## 2020-06-21 NOTE — Patient Instructions (Signed)
We talked about Marilyn Rivera (once weekly injectable); the once daily is called [place]  We talked about Phentermine and Vyvanse (can affect blood pressure and are appetite suppressants)  Wellbutrin has had some weight loss associated but can help with depression.  Can use with the above if needed.  Start Vitamin D supplement daily over the counter.  This helps with weight loss.  Drink WATER.  Come in for fasting labs

## 2020-06-21 NOTE — Progress Notes (Signed)
Subjective: CC: Chronic bilateral knee pain PCP: [name], DO [id number] Marilyn Rivera is a 40 y.o. female presenting to clinic today for:  1.  Chronic bilateral knee pain/ elevated BP/ morbid obesity Patient was last seen for this issue by me in November 2020.  She had a telephone visit March 30 and was put on a short course of steroid.  She was unaware that this would impact her ability to get corticosteroid injection so has since waited for about 2 weeks so that she could have this done today.  She has used Voltaren twice daily as needed.   She does have ongoing bilateral knee pain.  She admits to weight gain since her divorce.  She wants to talk to me about weight as well today.  She continues to work a sedentary job at home but is motivated to become more active.  She notes that she really does not eat foods that are "bad for her".  She does not consume sugary beverages.  She did have an episode where she had some chest tightness recently but that has since resolved.  No nausea, vomiting, chest pain, shortness of breath, headache or visual disturbance.   ROS: Per HPI  Allergies  Allergen Reactions  . Keflex [Cephalexin]     Yeast infections   . Coconut Fatty Acids Itching and Rash   Past Medical History:  Diagnosis Date  . Arthritis   . Diverticulitis large intestine w/o perforation or abscess w/o bleeding 10/05/2014  . Diverticulosis   . GERD (gastroesophageal reflux disease)    history of H Pylori  . Helicobacter pylori gastritis 09/06/2014  . History of kidney stones   . Iron deficiency anemia   . Kidney stones     Current Outpatient Medications:  .  ciprofloxacin (CIPRO) 500 MG tablet, Take 1 tablet (500 mg total) by mouth 2 (two) times daily., Disp: 14 tablet, Rfl: 0 .  diclofenac (VOLTAREN) 75 MG EC tablet, Take 1 tablet (75 mg total) by mouth 2 (two) times daily as needed for moderate pain., Disp: 90 tablet, Rfl: 0 .  fluticasone (FLONASE) 50 MCG/ACT nasal  spray, Place 2 sprays into both nostrils daily., Disp: 16 g, Rfl: 6 .  metroNIDAZOLE (FLAGYL) 500 MG tablet, Take 1 tablet (500 mg total) by mouth 2 (two) times daily., Disp: 14 tablet, Rfl: 0 .  omeprazole (PRILOSEC) 40 MG capsule, TAKE (1) CAPSULE DAILY. NEED TO BE SEEN FOR APPOINTMENT, Disp: 30 capsule, Rfl: 5 Social History   Socioeconomic History  . Marital status: Legally Separated    Spouse name: Not on file  . Number of children: 0  . Years of education: Not on file  . Highest education level: Not on file  Occupational History  . Occupation: Engineer, petroleum, Warehouse manager for resource side  Tobacco Use  . Smoking status: Never Smoker  . Smokeless tobacco: Never Used  Vaping Use  . Vaping Use: Never used  Substance and Sexual Activity  . Alcohol use: No  . Drug use: No  . Sexual activity: Not on file  Other Topics Concern  . Not on file  Social History Narrative  . Not on file   Social Determinants of Health   Financial Resource Strain: Not on file  Food Insecurity: Not on file  Transportation Needs: Not on file  Physical Activity: Not on file  Stress: Not on file  Social Connections: Not on file  Intimate Partner Violence: Not on file   Family History  Problem Relation  Age of Onset  . Heart failure Mother   . Hypertension Mother   . Heart disease Mother   . Cancer Other        mother's side of family, breast cancer  . Cancer Other        father's side of family, leukemia  . Hypertension Father   . Stroke Father 76  . Diabetes Other   . Hypertension Other   . Colon cancer Cousin   . Healthy Brother   . Breast cancer Maternal Aunt 37  . Leukemia Paternal Aunt   . Kidney disease Maternal Grandmother   . Hypertension Maternal Grandmother   . Cancer Maternal Grandfather   . Leukemia Paternal Grandmother   . Cancer Paternal Grandfather     Objective: Office vital signs reviewed. BP 140/86   Pulse 96   Temp 98.1 F (36.7 C)   Ht '5\' 10"'  (1.778 m)   Wt (!)  433 lb (196.4 kg)   SpO2 97%   BMI 62.13 kg/m   Physical Examination:  General: Awake, alert, morbidly obese, No acute distress Cardio: regular rate and rhythm, S1S2 heard, no murmurs appreciated Pulm: clear to auscultation bilaterally, no wheezes, rhonchi or rales; normal work of breathing on room air Extremities: warm, well perfused, No edema, cyanosis or clubbing; +2 pulses bilaterally MSK: Antalgic gait; Anatomic landmarks are somewhat obscured by adiposity/body habitus of bilateral knees  JOINT INJECTION:  Patient denies allergy to antiseptics (including iodine) and anesthetics.  Patient denies h/o diabetes, frequent steroid use, use of blood thinners/ antiplatelets.  Patient was given informed consent and a signed copy has been placed in the chart. Appropriate time out was taken. Area prepped and draped in usual sterile fashion. Anatomic landmarks were identified and injection site was marked.  Ethyl chloride spray was used to numb the area and 1 cc of methylprednisolone 40 mg/ml plus  3 cc of 1% lidocaine without epinephrine was injected into the right knee  using a(n) anteriolateral approach. The patient tolerated the procedure well and there were no immediate complications. Estimated blood loss is less than 1 cc.  Post procedure instructions were reviewed and handout outlining these instructions were provided to patient.  JOINT INJECTION:  Patient was given informed consent and a signed copy has been placed in the chart. Appropriate time out was taken. Area prepped and draped in usual sterile fashion. Anatomic landmarks were identified and injection site was marked.  Ethyl chloride spray was used to numb the area and 1 cc of methylprednisolone 40 mg/ml plus  3 cc of 1% lidocaine without epinephrine was injected into the left knee using a(n) anteriolateral approach. The patient tolerated the procedure well and there were no immediate complications. Estimated blood loss is less than 1 cc.   Post procedure instructions were reviewed and handout outlining these instructions were provided to patient.   Assessment/ Plan: 40 y.o. female   Bilateral chronic knee pain - Plan: methylPREDNISolone acetate (DEPO-MEDROL) injection 40 mg, methylPREDNISolone acetate (DEPO-MEDROL) injection 40 mg  Morbid obesity (HCC) - Plan: CMP14+EGFR, Lipid panel, Bayer DCA Hb A1c Waived, TSH  Accelerated hypertension - Plan: amLODipine (NORVASC) 5 MG tablet, CMP14+EGFR  Corticosteroid injections were administered bilaterally.  No immediate complications.  Home care instructions reviewed and reasons to return discussed  We discussed options for weight loss fairly extensively during this visit.  I think she would be a good candidate for [facility] or [facility].  I have also given her information for the [facility]  clinic with Dr. Leafy Ro.  This may be a very good fit given accountability that she needs.  Blood pressure was initially noted to be very elevated 189/103.  Upon recheck it was within more acceptable range but still very borderline.  However I am going to go ahead and start her on a low-dose Norvasc in efforts to keep her blood pressure in normal range.  No orders of the defined types were placed in this encounter.  No orders of the defined types were placed in this encounter.    [name], DO [place] [phone]

## 2020-06-23 ENCOUNTER — Other Ambulatory Visit: Payer: Self-pay

## 2020-06-23 ENCOUNTER — Other Ambulatory Visit: Payer: Managed Care, Other (non HMO)

## 2020-06-23 DIAGNOSIS — I10 Essential (primary) hypertension: Secondary | ICD-10-CM

## 2020-06-23 LAB — BAYER DCA HB A1C WAIVED: HB A1C (BAYER DCA - WAIVED): 7.3 % — ABNORMAL HIGH (ref <7.0)

## 2020-06-24 LAB — LIPID PANEL
Chol/HDL Ratio: 3.8 ratio (ref 0.0–4.4)
Cholesterol, Total: 165 mg/dL (ref 100–199)
HDL: 43 mg/dL (ref >39)
LDL Chol Calc (NIH): 102 mg/dL — ABNORMAL HIGH (ref 0–99)
Triglycerides: 107 mg/dL (ref 0–149)
VLDL Cholesterol Cal: 20 mg/dL (ref 5–40)

## 2020-06-24 LAB — CMP14+EGFR
ALT: 34 IU/L — ABNORMAL HIGH (ref 0–32)
AST: 20 IU/L (ref 0–40)
Albumin/Globulin Ratio: 1.3 (ref 1.2–2.2)
Albumin: 3.9 g/dL (ref 3.8–4.8)
Alkaline Phosphatase: 90 IU/L (ref 44–121)
BUN/Creatinine Ratio: 16 (ref 9–23)
BUN: 13 mg/dL (ref 6–24)
Bilirubin Total: 0.2 mg/dL (ref 0.0–1.2)
CO2: 21 mmol/L (ref 20–29)
Calcium: 9 mg/dL (ref 8.7–10.2)
Chloride: 103 mmol/L (ref 96–106)
Creatinine, Ser: 0.81 mg/dL (ref 0.57–1.00)
Globulin, Total: 3 g/dL (ref 1.5–4.5)
Glucose: 151 mg/dL — ABNORMAL HIGH (ref 65–99)
Potassium: 4.1 mmol/L (ref 3.5–5.2)
Sodium: 139 mmol/L (ref 134–144)
Total Protein: 6.9 g/dL (ref 6.0–8.5)
eGFR: 94 mL/min/{1.73_m2} (ref >59)

## 2020-06-24 LAB — TSH: TSH: 4.63 u[IU]/mL — ABNORMAL HIGH (ref 0.450–4.500)

## 2020-07-07 ENCOUNTER — Encounter: Payer: Self-pay | Admitting: Family Medicine

## 2020-07-17 ENCOUNTER — Ambulatory Visit (INDEPENDENT_AMBULATORY_CARE_PROVIDER_SITE_OTHER): Payer: Managed Care, Other (non HMO) | Admitting: Family Medicine

## 2020-07-17 ENCOUNTER — Other Ambulatory Visit: Payer: Self-pay

## 2020-07-17 ENCOUNTER — Encounter: Payer: Self-pay | Admitting: Family Medicine

## 2020-07-17 VITALS — BP 141/89 | HR 84 | Temp 98.2°F | Ht 70.0 in | Wt >= 6400 oz

## 2020-07-17 DIAGNOSIS — E119 Type 2 diabetes mellitus without complications: Secondary | ICD-10-CM | POA: Diagnosis not present

## 2020-07-17 MED ORDER — METFORMIN HCL 500 MG PO TABS: 500.0000 mg | ORAL_TABLET | Freq: Every day | ORAL | 3 refills | Status: DC

## 2020-07-17 MED ORDER — RYBELSUS 3 MG PO TABS: 3.0000 mg | ORAL_TABLET | Freq: Every day | ORAL | 0 refills | Status: AC

## 2020-07-17 NOTE — Progress Notes (Signed)
Subjective: CC:new onset DM PCP: Marilyn Norlander, DO VOJ:[id number] V Marilyn Rivera is a 40 y.o. female presenting to clinic today for:  1. Type 2 Diabetes, new onset Patient suspects that her consuming 3 sugary beverages per day, energy drinks, is likely contributory.  She also has had quite a bit of weight gain since last year due to change in activity level.  She just started coordinating with a new trainer to get physically active again as her work does involve her sitting for about 40 hours/week.  She admits that rice is a weakness but she does not often consume sugar otherwise.  Last eye exam: Needs Last foot exam: Needs Last A1c:  Lab Results  Component Value Date   HGBA1C 7.3 (H) 06/23/2020   Nephropathy screen indicated?:  Needs Last flu, zoster and/or pneumovax:  Immunization History  Administered Date(s) Administered  . Influenza,inj,Quad PF,6+ Mos 01/18/2019  . MMR 04/07/1997  . Moderna Sars-Covid-2 Vaccination 01/18/2020  . PFIZER(Purple Top)SARS-COV-2 Vaccination 05/22/2019, 06/15/2019  . Tdap 05/24/2020    ROS: Reports thirst, nocturia.  No chest pain, shortness of breath.  Suffers from chronic bilateral knee pain that is better since corticosteroid injection administered last visit.  ROS: Per HPI  Allergies  Allergen Reactions  . Keflex [Cephalexin]     Yeast infections   . Coconut Fatty Acids Itching and Rash   Past Medical History:  Diagnosis Date  . Arthritis   . Diverticulitis large intestine w/o perforation or abscess w/o bleeding 10/05/2014  . Diverticulosis   . GERD (gastroesophageal reflux disease)    history of H Pylori  . Helicobacter pylori gastritis 09/06/2014  . History of kidney stones   . Iron deficiency anemia   . Kidney stones     Current Outpatient Medications:  .  amLODipine (NORVASC) 5 MG tablet, Take 1 tablet (5 mg total) by mouth daily., Disp: 90 tablet, Rfl: 3 .  ciprofloxacin (CIPRO) 500 MG tablet, Take 1 tablet (500 mg total) by  mouth 2 (two) times daily., Disp: 14 tablet, Rfl: 0 .  diclofenac (VOLTAREN) 75 MG EC tablet, Take 1 tablet (75 mg total) by mouth 2 (two) times daily as needed for moderate pain., Disp: 90 tablet, Rfl: 0 .  fluticasone (FLONASE) 50 MCG/ACT nasal spray, Place 2 sprays into both nostrils daily., Disp: 16 g, Rfl: 6 .  metroNIDAZOLE (FLAGYL) 500 MG tablet, Take 1 tablet (500 mg total) by mouth 2 (two) times daily., Disp: 14 tablet, Rfl: 0 .  montelukast (SINGULAIR) 10 MG tablet, Take 1 tablet (10 mg total) by mouth at bedtime., Disp: 90 tablet, Rfl: 3 .  omeprazole (PRILOSEC) 40 MG capsule, TAKE (1) CAPSULE DAILY. NEED TO BE SEEN FOR APPOINTMENT, Disp: 30 capsule, Rfl: 5 Social History   Socioeconomic History  . Marital status: Divorced    Spouse name: Not on file  . Number of children: 0  . Years of education: Not on file  . Highest education level: Not on file  Occupational History  . Occupation: Engineer, petroleum, Warehouse manager for resource side  Tobacco Use  . Smoking status: Never Smoker  . Smokeless tobacco: Never Used  Vaping Use  . Vaping Use: Never used  Substance and Sexual Activity  . Alcohol use: No  . Drug use: No  . Sexual activity: Not on file  Other Topics Concern  . Not on file  Social History Narrative  . Not on file   Social Determinants of Health   Financial Resource Strain: Not on file  Food Insecurity: Not on file  Transportation Needs: Not on file  Physical Activity: Not on file  Stress: Not on file  Social Connections: Not on file  Intimate Partner Violence: Not on file   Family History  Problem Relation Age of Onset  . Heart failure Mother   . Hypertension Mother   . Heart disease Mother   . Cancer Other        mother's side of family, breast cancer  . Cancer Other        father's side of family, leukemia  . Hypertension Father   . Stroke Father 108  . Diabetes Other   . Hypertension Other   . Colon cancer Cousin   . Healthy Brother   . Breast cancer  Maternal Aunt 79  . Leukemia Paternal Aunt   . Kidney disease Maternal Grandmother   . Hypertension Maternal Grandmother   . Cancer Maternal Grandfather   . Leukemia Paternal Grandmother   . Cancer Paternal Grandfather     Objective: Office vital signs reviewed. BP (!) 145/87   Pulse 84   Temp 98.2 F (36.8 C)   Ht '5\' 10"'  (1.778 m)   Wt (!) 433 lb (196.4 kg)   SpO2 97%   BMI 62.13 kg/m   Physical Examination:  General: Awake, alert, obese, No acute distress MSK: Ambulating independently  Assessment/ Plan: 40 y.o. female   New onset type 2 diabetes mellitus ([name]) - Plan: Microalbumin / creatinine urine ratio, Amb ref to Medical Nutrition Therapy-MNT, metFORMIN (GLUCOPHAGE) 500 MG tablet, Semaglutide (RYBELSUS) 3 MG TABS  Morbid obesity ([name]) - Plan: Amb ref to Medical Nutrition Therapy-MNT, metFORMIN (GLUCOPHAGE) 500 MG tablet, Semaglutide (RYBELSUS) 3 MG TABS  Sugar noted to be elevated.  Start metformin, trial of Rybelsus.  Would like her to see Almyra Free in 2 weeks for recheck.  If doing well on Rybelsus want to advance her to 7 mg daily.  Accu-Chek meter provided.  Discussed indications for blood sugar checks.  Follow-up with me in 3 months for repeat lab testing, sooner if needed  Handout provided with low carbohydrate snack options.  Referral to nutrition therapy also placed.  Encouraged to start her exercise regimen.  No orders of the defined types were placed in this encounter.  No orders of the defined types were placed in this encounter.    Marilyn Norlander, DO [place] [phone]

## 2020-08-02 ENCOUNTER — Encounter: Payer: Self-pay | Admitting: Family Medicine

## 2020-08-14 ENCOUNTER — Encounter: Payer: Self-pay | Admitting: Family Medicine

## 2020-08-25 ENCOUNTER — Encounter: Payer: Self-pay | Admitting: Family Medicine

## 2020-08-29 ENCOUNTER — Ambulatory Visit (INDEPENDENT_AMBULATORY_CARE_PROVIDER_SITE_OTHER): Payer: Managed Care, Other (non HMO) | Admitting: Pharmacist

## 2020-08-29 DIAGNOSIS — E119 Type 2 diabetes mellitus without complications: Secondary | ICD-10-CM | POA: Diagnosis not present

## 2020-08-29 MED ORDER — RYBELSUS 7 MG PO TABS: 7.0000 mg | ORAL_TABLET | Freq: Every day | ORAL | 1 refills | Status: DC

## 2020-08-29 NOTE — Progress Notes (Signed)
     08/29/2020 Name: Marilyn Rivera MRN: [id number] DOB: [birth year]   S:  40 yoF Presents for diabetes evaluation, education, and management Patient was referred and last seen by Primary Care Provider on 07/17/20. Patient reports Diabetes was diagnosed in 07/17/20.  Insurance coverage/medication affordability: cigna  Patient reports adherence with medications. Current diabetes medications include: rybelsus 3mg , metformin Current hypertension medications include: norvasc Goal 130/80 Current hyperlipidemia medications include: n/a    Patient denies hypoglycemic events.   Patient reported dietary habits: Eats 3 meals/day Discussed meal planning options and Plate method for healthy eating Avoid sugary drinks and desserts Incorporate balanced protein, non starchy veggies, 1 serving of carbohydrate with each meal Increase water intake Increase physical activity as able  Patient-reported exercise habits: gets on elliptical in between visits/working from home    O:  Lab Results  Component Value Date   HGBA1C 7.3 (H) 06/23/2020     Lipid Panel     Component Value Date/Time   CHOL 165 06/23/2020 0808   TRIG 107 06/23/2020 0808   HDL 43 06/23/2020 0808   CHOLHDL 3.8 06/23/2020 0808   CHOLHDL 2.9 04/28/2014 0556   VLDL 12 04/28/2014 0556   LDLCALC 102 (H) 06/23/2020 0808     Home fasting blood sugars: n/a  2 hour post-meal/random blood sugars: n/a.    Clinical Atherosclerotic Cardiovascular Disease (ASCVD): No   The 10-year ASCVD risk score Mikey Bussing DC Jr., et al., 2013) is: 8.8%   Values used to calculate the score:     Age: 40 years     Sex: Female     Is Non-Hispanic African American: Yes     Diabetic: Yes     Tobacco smoker: No     Systolic Blood Pressure: 503 mmHg     Is BP treated: Yes     HDL Cholesterol: 43 mg/dL     Total Cholesterol: 165 mg/dL    A/P:  Diabetes t2dm new onset, A1c is 7.3%.  Patient is motivated to start medication for diabetes and  weight loss.  She reports she is eating less after using Rybelsus 3mg  daily samples.  We will increase Rybelsus to 7mg  daily.  Denies personal and family history of Medullary thyroid cancer (MTC). Counseled patient to eat smaller meals, low fat/low sugar, avoid carbonated drinks, and increase water to avoid potential side effects.  Copay cards given.  Continue metformin.  Start checking BG at home/2-3 times weekly (fasting).    -Extensively discussed pathophysiology of diabetes, recommended lifestyle interventions, dietary effects on blood sugar control  -Counseled on s/sx of and management of hypoglycemia  -Next A1C anticipated 3 months.   Written patient instructions provided.  Total time in face to face counseling 25 minutes.   Regina Eck, PharmD, BCPS Clinical Pharmacist, [place]  [facility] Phone [phone]

## 2020-08-31 ENCOUNTER — Telehealth: Payer: Self-pay | Admitting: Family Medicine

## 2020-08-31 ENCOUNTER — Other Ambulatory Visit: Payer: Self-pay | Admitting: Family Medicine

## 2020-08-31 DIAGNOSIS — M25561 Pain in right knee: Secondary | ICD-10-CM

## 2020-08-31 DIAGNOSIS — G8929 Other chronic pain: Secondary | ICD-10-CM

## 2020-08-31 NOTE — Telephone Encounter (Signed)
Please text saving card for rx Semaglutide (RYBELSUS) 7 MG TABS to patient. She accidentally erased it.

## 2020-08-31 NOTE — Telephone Encounter (Signed)
SENT PATIENT ANOTHER TEXT MESSAGE WITH COPAY CARDS--SHE CAN ALWAYS GO ONLINE AND GOOGLE A COUPON AS WLL

## 2020-09-14 ENCOUNTER — Ambulatory Visit: Payer: Self-pay | Admitting: Registered"

## 2020-09-15 ENCOUNTER — Telehealth: Payer: Self-pay | Admitting: *Deleted

## 2020-09-15 DIAGNOSIS — E119 Type 2 diabetes mellitus without complications: Secondary | ICD-10-CM

## 2020-09-15 NOTE — Telephone Encounter (Signed)
Rybelsus '7MG'$  tablets PA started a few days ago - waiting on response from pt regaurding insurance issue.   Will attempt again today  Per billing she has Christella Scheuermann - will try that way  Marilyn Rivera (Key: [id number]) [id number] Rybelsus '7MG'$  tablets     Status: Sent to Plan

## 2020-09-18 NOTE — Telephone Encounter (Signed)
Got a fax that pt was not found in the ins system for them.  Put in new ins ID and re-sent.

## 2020-09-19 NOTE — Telephone Encounter (Signed)
Fax form came in that PA was not needed for [facility] - encounter to be closed

## 2020-10-03 ENCOUNTER — Telehealth: Payer: Self-pay | Admitting: Family Medicine

## 2020-10-03 DIAGNOSIS — N63 Unspecified lump in unspecified breast: Secondary | ICD-10-CM

## 2020-10-03 NOTE — Telephone Encounter (Signed)
Pt called requesting to speak directly with Dr Lajuana Ripple. Says she has some questions about her medication.

## 2020-10-03 NOTE — Telephone Encounter (Signed)
Patient reporting some right-sided breast tenderness with palpable lump.  There is a strong family history of cancer and she worries about this.  She had a mammogram performed in March but was told that her breast tissue was dense and this could not totally rule out malignancy.  Fibroglandular tissue was noted.  She is always had irregular menstrual cycles so unsure if this is a fibroadenoma that is becoming inflamed in the setting of hormone fluctuations.  Additionally, she notes great response to Rybelsus as far as appetite goes.  However, she is had quite a bit of difficulty securing the 7 mg Rybelsus with her pharmacy.  The coupon that she was provided has not been working.  Asking for assistance with that as well today  Orders Placed This Encounter  Procedures   MM Digital Diagnostic Bilat   US BREAST COMPLETE UNI RIGHT INC AXILLA

## 2020-10-04 ENCOUNTER — Telehealth: Payer: Self-pay | Admitting: Pharmacist

## 2020-10-04 ENCOUNTER — Other Ambulatory Visit: Payer: Self-pay

## 2020-10-04 DIAGNOSIS — N63 Unspecified lump in unspecified breast: Secondary | ICD-10-CM

## 2020-10-04 NOTE — Telephone Encounter (Signed)
Left VM, no answer Please see why patient unable to get rybelsus I gave her a coupon card to pick up rybelsus '7mg'$  She needs to text READY to [phone] to get copay card

## 2020-10-04 NOTE — Telephone Encounter (Signed)
Call placed to pharmacy They didn't have patient's insurance Called in insurance and copay card If still too expensive--requested patient call me

## 2020-10-13 ENCOUNTER — Encounter: Payer: Self-pay | Admitting: Family Medicine

## 2020-10-13 NOTE — Telephone Encounter (Signed)
That's fine but PLEASE make sure she is here on time and that she has a 50mn slot

## 2020-10-18 ENCOUNTER — Ambulatory Visit: Payer: Managed Care, Other (non HMO) | Admitting: Family

## 2020-10-18 ENCOUNTER — Encounter: Payer: Self-pay | Admitting: Family

## 2020-10-18 ENCOUNTER — Encounter: Payer: Self-pay | Admitting: Family Medicine

## 2020-10-18 ENCOUNTER — Other Ambulatory Visit: Payer: Self-pay | Admitting: Family Medicine

## 2020-10-18 DIAGNOSIS — G8929 Other chronic pain: Secondary | ICD-10-CM

## 2020-10-18 DIAGNOSIS — J302 Other seasonal allergic rhinitis: Secondary | ICD-10-CM | POA: Diagnosis not present

## 2020-10-18 DIAGNOSIS — J209 Acute bronchitis, unspecified: Secondary | ICD-10-CM | POA: Diagnosis not present

## 2020-10-18 MED ORDER — PREDNISONE 10 MG (21) PO TBPK: ORAL_TABLET | ORAL | 0 refills | Status: DC

## 2020-10-18 MED ORDER — FLUTICASONE PROPIONATE 50 MCG/ACT NA SUSP
2.0000 | Freq: Every day | NASAL | 6 refills | Status: DC
Start: 2020-10-18 — End: 2022-11-15

## 2020-10-18 MED ORDER — MONTELUKAST SODIUM 10 MG PO TABS: 10.0000 mg | ORAL_TABLET | Freq: Every day | ORAL | 3 refills | Status: DC

## 2020-10-18 NOTE — Progress Notes (Signed)
Virtual Visit  Note Due to COVID-19 pandemic this visit was conducted virtually. This visit type was conducted due to national recommendations for restrictions regarding the COVID-19 Pandemic (e.g. social distancing, sheltering in place) in an effort to limit this patient's exposure and mitigate transmission in our community. All issues noted in this document were discussed and addressed.  A physical exam was not performed with this format.  I connected with Marilyn Rivera on 10/18/20 at 10:42 AM  by telephone and verified that I am speaking with the correct person using two identifiers. Marilyn Rivera is currently located at home/work and no one is currently with her during visit. The provider, Evelina Dun, FNP is located in their office at time of visit.  I discussed the limitations, risks, security and privacy concerns of performing an evaluation and management service by telephone and the availability of in person appointments. I also discussed with the patient that there may be a patient responsible charge related to this service. The patient expressed understanding and agreed to proceed.   History and Present Illness:  PT calls the office today with cough that started four days ago and has worsen. She reports she was COVID negative.  Cough This is a new problem. The current episode started in the past 7 days. The problem has been waxing and waning. The problem occurs every few minutes. The cough is Productive of sputum. Associated symptoms include headaches (slight), nasal congestion and postnasal drip. Pertinent negatives include no chills, ear congestion, ear pain, fever, myalgias, rhinorrhea, sore throat, shortness of breath or wheezing. She has tried rest for the symptoms. The treatment provided moderate relief.     Review of Systems  Constitutional:  Negative for chills and fever.  HENT:  Positive for postnasal drip. Negative for ear pain, rhinorrhea and sore throat.    Respiratory:  Positive for cough. Negative for shortness of breath and wheezing.   Musculoskeletal:  Negative for myalgias.  Neurological:  Positive for headaches (slight).    Observations/Objective: No SOB or distress noted, intermittent dry cough, hoarse voice.   Assessment and Plan: 1. Acute bronchitis, unspecified organism - Take meds as prescribed - Use a cool mist humidifier  -Use saline nose sprays frequently -Force fluids -For any cough or congestion  Use plain Mucinex- regular strength or max strength is fine -For fever or aces or pains- take tylenol or ibuprofen. -Throat lozenges if help -RTO if symptoms worsen or do not improve  - predniSONE (STERAPRED UNI-PAK 21 TAB) 10 MG (21) TBPK tablet; Use as directed  Dispense: 21 tablet; Refill: 0  2. Seasonal allergies - fluticasone (FLONASE) 50 MCG/ACT nasal spray; Place 2 sprays into both nostrils daily.  Dispense: 16 g; Refill: 6      I discussed the assessment and treatment plan with the patient. The patient was provided an opportunity to ask questions and all were answered. The patient agreed with the plan and demonstrated an understanding of the instructions.   The patient was advised to call back or seek an in-person evaluation if the symptoms worsen or if the condition fails to improve as anticipated.  The above assessment and management plan was discussed with the patient. The patient verbalized understanding of and has agreed to the management plan. Patient is aware to call the clinic if symptoms persist or worsen. Patient is aware when to return to the clinic for a follow-up visit. Patient educated on when it is appropriate to go to the emergency department.  Time call ended:  10:54 AM   I provided 12 minutes of  non face-to-face time during this encounter.    Evelina Dun, FNP

## 2020-10-23 ENCOUNTER — Ambulatory Visit: Payer: Managed Care, Other (non HMO) | Admitting: Family Medicine

## 2020-10-23 ENCOUNTER — Encounter: Payer: Self-pay | Admitting: Family Medicine

## 2020-10-23 ENCOUNTER — Other Ambulatory Visit: Payer: Self-pay

## 2020-10-23 VITALS — BP 139/77 | HR 66 | Temp 97.1°F | Ht 70.0 in | Wt >= 6400 oz

## 2020-10-23 DIAGNOSIS — N644 Mastodynia: Secondary | ICD-10-CM | POA: Diagnosis not present

## 2020-10-23 DIAGNOSIS — M25561 Pain in right knee: Secondary | ICD-10-CM | POA: Diagnosis not present

## 2020-10-23 DIAGNOSIS — G8929 Other chronic pain: Secondary | ICD-10-CM | POA: Diagnosis not present

## 2020-10-23 DIAGNOSIS — F4321 Adjustment disorder with depressed mood: Secondary | ICD-10-CM | POA: Diagnosis not present

## 2020-10-23 DIAGNOSIS — F4320 Adjustment disorder, unspecified: Secondary | ICD-10-CM

## 2020-10-23 DIAGNOSIS — M25562 Pain in left knee: Secondary | ICD-10-CM | POA: Diagnosis not present

## 2020-10-23 MED ORDER — METHYLPREDNISOLONE ACETATE 40 MG/ML IJ SUSP
40.0000 mg | Freq: Once | INTRAMUSCULAR | Status: AC
  Administered 2020-10-23: 40 mg via INTRAMUSCULAR

## 2020-10-23 NOTE — Addendum Note (Signed)
Addended by: [name] on: 10/23/2020 01:26 PM   Modules accepted: Orders

## 2020-10-23 NOTE — Progress Notes (Addendum)
Subjective: CC: Knee pain PCP: [name], DO [id number] Marilyn Rivera is a 40 y.o. female presenting to clinic today for:  1.  Knee pain Patient reports recurrent knee pain bilaterally with right worse than left.  She reports instability and difficulty with gait.  2.  Grief Patient reports that her grandmother died recently.  Whilst her grandmother was of advanced age, she was not especially ill and therefore this was a surprise to the patient.  She had a very close relationship with her grandmother and has been trying to work through her grief  3.  Right-sided breast pain Patient has ongoing right-sided breast pain that is intermittent but is fairly pronounced when it occurs.  She has an appointment coming up for repeat mammogram.  She needs to establish with a new GYN and is asking for referral today.  ROS: Per HPI  Allergies  Allergen Reactions   Keflex [Cephalexin]     Yeast infections    Coconut Fatty Acids Itching and Rash   Past Medical History:  Diagnosis Date   Arthritis    Diverticulitis large intestine w/o perforation or abscess w/o bleeding 10/05/2014   Diverticulosis    GERD (gastroesophageal reflux disease)    history of H Pylori   Helicobacter pylori gastritis 09/06/2014   History of kidney stones    Iron deficiency anemia    Kidney stones     Current Outpatient Medications:    amLODipine (NORVASC) 5 MG tablet, Take 1 tablet (5 mg total) by mouth daily., Disp: 90 tablet, Rfl: 3   diclofenac (VOLTAREN) 75 MG EC tablet, TAKE 1 TABLET 2 TABLETS DAILY AS NEEDED FOR MODERATE PAIN, Disp: 90 tablet, Rfl: 0   fluticasone (FLONASE) 50 MCG/ACT nasal spray, Place 2 sprays into both nostrils daily., Disp: 16 g, Rfl: 6   metFORMIN (GLUCOPHAGE) 500 MG tablet, Take 1 tablet (500 mg total) by mouth daily with breakfast., Disp: 90 tablet, Rfl: 3   montelukast (SINGULAIR) 10 MG tablet, Take 1 tablet (10 mg total) by mouth at bedtime., Disp: 90 tablet, Rfl: 3   omeprazole  (PRILOSEC) 40 MG capsule, TAKE (1) CAPSULE DAILY. NEED TO BE SEEN FOR APPOINTMENT, Disp: 30 capsule, Rfl: 5   predniSONE (STERAPRED UNI-PAK 21 TAB) 10 MG (21) TBPK tablet, Use as directed, Disp: 21 tablet, Rfl: 0   Semaglutide (RYBELSUS) 7 MG TABS, Take 7 mg by mouth daily., Disp: 30 tablet, Rfl: 1 Social History   Socioeconomic History   Marital status: Divorced    Spouse name: Not on file   Number of children: 0   Years of education: Not on file   Highest education level: Not on file  Occupational History   Occupation: [employer], Warehouse manager for resource side  Tobacco Use   Smoking status: Never   Smokeless tobacco: Never  Vaping Use   Vaping Use: Never used  Substance and Sexual Activity   Alcohol use: No   Drug use: No   Sexual activity: Not on file  Other Topics Concern   Not on file  Social History Narrative   Not on file   Social Determinants of Health   Financial Resource Strain: Not on file  Food Insecurity: Not on file  Transportation Needs: Not on file  Physical Activity: Not on file  Stress: Not on file  Social Connections: Not on file  Intimate Partner Violence: Not on file   Family History  Problem Relation Age of Onset   Heart failure Mother    Hypertension  Mother    Heart disease Mother    Cancer Other        mother's side of family, breast cancer   Cancer Other        father's side of family, leukemia   Hypertension Father    Stroke Father 56   Diabetes Other    Hypertension Other    Colon cancer Cousin    Healthy Brother    Breast cancer Maternal Aunt 14   Leukemia Paternal Aunt    Kidney disease Maternal Grandmother    Hypertension Maternal Grandmother    Cancer Maternal Grandfather    Leukemia Paternal Grandmother    Cancer Paternal Grandfather     Objective: Office vital signs reviewed. BP 139/77   Pulse 66   Temp (!) 97.1 F (36.2 C)   Ht '5\' 10"'$  (1.778 m)   Wt (!) 433 lb (196.4 kg)   SpO2 93%   BMI 62.13 kg/m   Physical  Examination:  General: Awake, alert, morbidly obese, No acute distress MSK: Antalgic gait and station  Knees: No gross joint effusions appreciated.  She has a clearly antalgic gait.  No palpable crepitus Psych: Mood somewhat depressed.  JOINT INJECTION:  Patient denies allergy to antiseptics (including iodine) and anesthetics.  Patient DOES have a new dx diabetes, No frequent steroid use, use of blood thinners/ antiplatelets.  Patient was given informed consent and a signed copy has been placed in the chart. Appropriate time out was taken. Area prepped and draped in usual sterile fashion. Anatomic landmarks were identified and injection site was marked.  Ethyl chloride spray was used to numb the area and 1 cc of methylprednisolone 40 mg/ml plus  3 cc of 1% lidocaine without epinephrine was injected into the right knee using a(n) anteriolateral approach. The patient tolerated the procedure well and there were no immediate complications. Estimated blood loss is less than 2 cc.  Post procedure instructions were reviewed and handout outlining these instructions were provided to patient.  JOINT INJECTION:  Patient was given informed consent and a signed copy has been placed in the chart. Appropriate time out was taken. Area prepped and draped in usual sterile fashion. Anatomic landmarks were identified and injection site was marked.  Ethyl chloride spray was used to numb the area and 1 cc of methylprednisolone 40 mg/ml plus  3 cc of 1% lidocaine without epinephrine was injected into the left knee using a(n) anteriolateral approach. The patient tolerated the procedure well and there were no immediate complications. Estimated blood loss is less than 1 cc.  Post procedure instructions were reviewed and handout outlining these instructions were provided to patient.    Assessment/ Plan: 39 y.o. female   Bilateral chronic knee pain - Plan: methylPREDNISolone acetate (DEPO-MEDROL) injection 40 mg,  methylPREDNISolone acetate (DEPO-MEDROL) injection 40 mg  Grief reaction  Breast pain, right - Plan: Ambulatory referral to Obstetrics / Gynecology  Trigger corticosteroid injections bilaterally today.  Discussed the impact of steroids on her blood sugar.  Reinforced need to be compliant with Rybelsus.  Would like to see her back in the next 2 to 3 months for A1c recheck, weight recheck.  Currently experiencing grief after the loss of her grandmother.  She seems to be working through this on her own but low threshold for her to contact me if she feels that she needs additional interventions, counseling.  Keep mammogram appointment.  Referral to GYN placed  No orders of the defined types were placed in this encounter.  No orders of the defined types were placed in this encounter.    [name], DO [place] [phone]

## 2020-10-23 NOTE — Patient Instructions (Signed)

## 2020-10-25 ENCOUNTER — Encounter: Payer: Self-pay | Admitting: Family Medicine

## 2020-10-26 ENCOUNTER — Encounter: Payer: Self-pay | Admitting: Family Medicine

## 2020-10-26 ENCOUNTER — Ambulatory Visit (INDEPENDENT_AMBULATORY_CARE_PROVIDER_SITE_OTHER): Payer: Managed Care, Other (non HMO) | Admitting: Family Medicine

## 2020-10-26 DIAGNOSIS — L0292 Furuncle, unspecified: Secondary | ICD-10-CM

## 2020-10-26 MED ORDER — SULFAMETHOXAZOLE-TRIMETHOPRIM 800-160 MG PO TABS: 1.0000 | ORAL_TABLET | Freq: Two times a day (BID) | ORAL | 0 refills | Status: AC

## 2020-10-26 NOTE — Progress Notes (Signed)
   Virtual Visit  Note Due to COVID-19 pandemic this visit was conducted virtually. This visit type was conducted due to national recommendations for restrictions regarding the COVID-19 Pandemic (e.g. social distancing, sheltering in place) in an effort to limit this patient's exposure and mitigate transmission in our community. All issues noted in this document were discussed and addressed.  A physical exam was not performed with this format.  I connected with Marilyn Rivera on 10/26/20 at 1115 by telephone and verified that I am speaking with the correct person using two identifiers. Marilyn Rivera is currently located at work and no one is currently with her during visit. The provider, Gwenlyn Perking, FNP is located in their office at time of visit.  I discussed the limitations, risks, security and privacy concerns of performing an evaluation and management service by telephone and the availability of in person appointments. I also discussed with the patient that there may be a patient responsible charge related to this service. The patient expressed understanding and agreed to proceed.  CC: boils  History and Present Illness:  HPI Marilyn Rivera reports boils at multiples sites that started 1-2 days ago. She has one on the left middle of her back, one in her right axilla, and one her right lower sie, and one in her navel. They feel like warm, tender knots under her skin. The one of her right side has started to drain serosanguinous fluid. She denies fever or chills. Denies erythema. She reports that she just doesn't feel well. She has been using warm compresses.     ROS As per HPI.   Observations/Objective: Alert and oriented x 3. Able to speak in full sentences without difficulty.   Assessment and Plan: Palyn was seen today for recurrent skin infections.  Diagnoses and all orders for this visit:  Boils of multiple sites Bactrim as below. Discussed warm compress. Return to office for  new or worsening symptoms, or if symptoms persist.  -     sulfamethoxazole-trimethoprim (BACTRIM DS) 800-160 MG tablet; Take 1 tablet by mouth 2 (two) times daily for 7 days.    Follow Up Instructions: As needed.     I discussed the assessment and treatment plan with the patient. The patient was provided an opportunity to ask questions and all were answered. The patient agreed with the plan and demonstrated an understanding of the instructions.   The patient was advised to call back or seek an in-person evaluation if the symptoms worsen or if the condition fails to improve as anticipated.  The above assessment and management plan was discussed with the patient. The patient verbalized understanding of and has agreed to the management plan. Patient is aware to call the clinic if symptoms persist or worsen. Patient is aware when to return to the clinic for a follow-up visit. Patient educated on when it is appropriate to go to the emergency department.   Time call ended:  1127  I provided 12 minutes of  non face-to-face time during this encounter.    Gwenlyn Perking, FNP

## 2020-11-08 ENCOUNTER — Other Ambulatory Visit: Payer: Self-pay | Admitting: Family Medicine

## 2020-11-08 ENCOUNTER — Ambulatory Visit
Admission: RE | Admit: 2020-11-08 | Discharge: 2020-11-08 | Disposition: A | Discharge status: Home / Self Care | Payer: Managed Care, Other (non HMO) | Source: Ambulatory Visit | Attending: Family Medicine | Admitting: Family Medicine

## 2020-11-08 ENCOUNTER — Other Ambulatory Visit: Payer: Self-pay

## 2020-11-08 DIAGNOSIS — N63 Unspecified lump in unspecified breast: Secondary | ICD-10-CM

## 2020-12-04 ENCOUNTER — Encounter: Payer: Self-pay | Admitting: Family Medicine

## 2020-12-05 ENCOUNTER — Ambulatory Visit (INDEPENDENT_AMBULATORY_CARE_PROVIDER_SITE_OTHER): Payer: Managed Care, Other (non HMO) | Admitting: Family Medicine

## 2020-12-05 ENCOUNTER — Ambulatory Visit: Payer: Managed Care, Other (non HMO) | Admitting: Family Medicine

## 2020-12-05 DIAGNOSIS — B3731 Acute candidiasis of vulva and vagina: Secondary | ICD-10-CM | POA: Diagnosis not present

## 2020-12-05 DIAGNOSIS — L739 Follicular disorder, unspecified: Secondary | ICD-10-CM

## 2020-12-05 MED ORDER — FLUCONAZOLE 150 MG PO TABS
150.0000 mg | ORAL_TABLET | Freq: Once | ORAL | 0 refills | Status: AC
Start: 2020-12-05 — End: 2020-12-05

## 2020-12-05 MED ORDER — CLINDAMYCIN PHOSPHATE 1 % EX SWAB: CUTANEOUS | 12 refills | Status: DC

## 2020-12-05 NOTE — Telephone Encounter (Signed)
Agree requires appt.  Ok to put her in for a video/ phone visit with me.

## 2020-12-05 NOTE — Progress Notes (Signed)
Telephone visit  Subjective: CC: yeast vaginitis PCP: [name], DO [id number] Marilyn Rivera is a 40 y.o. female calls for telephone consult today. Patient provides verbal consent for consult held via phone.  Due to COVID-19 pandemic this visit was conducted virtually. This visit type was conducted due to national recommendations for restrictions regarding the COVID-19 Pandemic (e.g. social distancing, sheltering in place) in an effort to limit this patient's exposure and mitigate transmission in our community. All issues noted in this document were discussed and addressed.  A physical exam was not performed with this format.   Location of patient: home Location of provider: [facility] Others present for call: none  1. Yeast vaginitis Patient reports that she had recurrence of the boils that she was experiencing under her axilla and in the abdominal region.  She had some leftover antibiotics and therefore resumed use of these antibiotics.  She is not sure why these are starting to happen but wonders if it has anything to do with her menstrual cycle which has been absent.  She describes itching and irritation of the vagina.  Denies any vaginal discharge or dysuria.  No pelvic pain reported.  Not sexually active.   ROS: Per HPI  Allergies  Allergen Reactions   Keflex [Cephalexin]     Yeast infections    Coconut Fatty Acids Itching and Rash   Past Medical History:  Diagnosis Date   Arthritis    Diverticulitis large intestine w/o perforation or abscess w/o bleeding 10/05/2014   Diverticulosis    GERD (gastroesophageal reflux disease)    history of H Pylori   Helicobacter pylori gastritis 09/06/2014   History of kidney stones    Iron deficiency anemia    Kidney stones     Current Outpatient Medications:    amLODipine (NORVASC) 5 MG tablet, Take 1 tablet (5 mg total) by mouth daily., Disp: 90 tablet, Rfl: 3   diclofenac (VOLTAREN) 75 MG EC tablet, TAKE 1 TABLET 2 TABLETS DAILY AS  NEEDED FOR MODERATE PAIN, Disp: 90 tablet, Rfl: 0   fluticasone (FLONASE) 50 MCG/ACT nasal spray, Place 2 sprays into both nostrils daily., Disp: 16 g, Rfl: 6   metFORMIN (GLUCOPHAGE) 500 MG tablet, Take 1 tablet (500 mg total) by mouth daily with breakfast., Disp: 90 tablet, Rfl: 3   montelukast (SINGULAIR) 10 MG tablet, Take 1 tablet (10 mg total) by mouth at bedtime., Disp: 90 tablet, Rfl: 3   omeprazole (PRILOSEC) 40 MG capsule, TAKE (1) CAPSULE DAILY. NEED TO BE SEEN FOR APPOINTMENT, Disp: 30 capsule, Rfl: 5   predniSONE (STERAPRED UNI-PAK 21 TAB) 10 MG (21) TBPK tablet, Use as directed, Disp: 21 tablet, Rfl: 0   Semaglutide (RYBELSUS) 7 MG TABS, Take 7 mg by mouth daily., Disp: 30 tablet, Rfl: 1  Assessment/ Plan: 40 y.o. female   Folliculitis - Plan: clindamycin (CLEOCIN T) 1 % SWAB  Yeast vaginitis - Plan: fluconazole (DIFLUCAN) 150 MG tablet  Suspect that the folliculitis may be manifestations of hidradenitis suppurativa in the setting of morbid obesity, insulin resistance and new onset type 2 diabetes.  We discussed use of topical clindamycin to prevent future recurrences.  For the yeast vaginitis, which is likely resulted to recent antibiotic use in efforts to clear up after mentioned folliculitis/HS, Diflucan has been sent.  Discussed reasons for reevaluation.  Follow-up as scheduled for interval sugar check  Start time: 11:51am End time: 11:56am  Total time spent on patient care (including telephone call/ virtual visit): 5 minutes  [name]  [place], [place] [phone]

## 2020-12-14 ENCOUNTER — Other Ambulatory Visit: Payer: Self-pay | Admitting: Family Medicine

## 2020-12-18 ENCOUNTER — Telehealth: Payer: Self-pay | Admitting: Family Medicine

## 2020-12-18 NOTE — Telephone Encounter (Signed)
Spoke with patient and explained to her we do not have any appointments available with Dr. Darnell Level tomorrow or the rest of the week.  Patient has been experiencing urinary frequency, abdominal pain, and nausea for the last two weeks.  Appointment scheduled tomorrow at 9:30 with Marilyn Rivera.

## 2020-12-19 ENCOUNTER — Ambulatory Visit: Payer: Managed Care, Other (non HMO) | Admitting: Family Medicine

## 2020-12-19 ENCOUNTER — Encounter: Payer: Self-pay | Admitting: Nurse Practitioner

## 2020-12-19 ENCOUNTER — Ambulatory Visit: Payer: Managed Care, Other (non HMO) | Admitting: Nurse Practitioner

## 2020-12-19 ENCOUNTER — Other Ambulatory Visit: Payer: Self-pay

## 2020-12-19 VITALS — BP 137/83 | HR 96 | Temp 97.5°F

## 2020-12-19 DIAGNOSIS — R110 Nausea: Secondary | ICD-10-CM | POA: Diagnosis not present

## 2020-12-19 DIAGNOSIS — R300 Dysuria: Secondary | ICD-10-CM | POA: Diagnosis not present

## 2020-12-19 DIAGNOSIS — R350 Frequency of micturition: Secondary | ICD-10-CM

## 2020-12-19 LAB — URINALYSIS, ROUTINE W REFLEX MICROSCOPIC
Bilirubin, UA: NEGATIVE
Leukocytes,UA: NEGATIVE
Nitrite, UA: NEGATIVE
Protein,UA: NEGATIVE
Specific Gravity, UA: 1.015 (ref 1.005–1.030)
Urobilinogen, Ur: 0.2 mg/dL (ref 0.2–1.0)
pH, UA: 5.5 (ref 5.0–7.5)

## 2020-12-19 LAB — MICROSCOPIC EXAMINATION: Renal Epithel, UA: NONE SEEN /hpf

## 2020-12-19 MED ORDER — ONDANSETRON HCL 4 MG PO TABS: 4.0000 mg | ORAL_TABLET | Freq: Three times a day (TID) | ORAL | 0 refills | Status: DC | PRN

## 2020-12-19 MED ORDER — ACETAMINOPHEN 500 MG PO TABS
500.0000 mg | ORAL_TABLET | Freq: Four times a day (QID) | ORAL | 0 refills | Status: DC | PRN
Start: 2020-12-19 — End: 2022-11-15

## 2020-12-19 NOTE — Patient Instructions (Signed)
Nausea, Adult Nausea is the feeling that you have an upset stomach or that you are about to vomit. Nausea on its own is not usually a serious concern, but it may be an early sign of a more serious medical problem. As nausea gets worse, it can lead to vomiting. If vomiting develops, or if you are not able to drink enough fluids, you are at risk of becoming dehydrated. Dehydration can make you tired and thirsty, cause you to have a dry mouth, and decrease how often you urinate. Older adults and people with other diseases or a weak disease-fighting system (immune system) are at higher risk for dehydration. The main goals of treating your nausea are: To relieve your nausea. To limit repeated nausea episodes. To prevent vomiting and dehydration. Follow these instructions at home: Watch your symptoms for any changes. Tell your health care provider about them. Follow these instructions as told by your health care provider. Eating and drinking   Take an oral rehydration solution (ORS). This is a drink that is sold at pharmacies and retail stores. Drink clear fluids slowly and in small amounts as you are able. Clear fluids include water, ice chips, low-calorie sports drinks, and fruit juice that has water added (diluted fruit juice). Eat bland, easy-to-digest foods in small amounts as you are able. These foods include bananas, applesauce, rice, lean meats, toast, and crackers. Avoid drinking fluids that contain a lot of sugar or caffeine, such as energy drinks, sports drinks, and soda. Avoid alcohol. Avoid spicy or fatty foods. General instructions Take over-the-counter and prescription medicines only as told by your health care provider. Rest at home while you recover. Drink enough fluid to keep your urine pale yellow. Breathe slowly and deeply when you feel nauseous. Avoid smelling things that have strong odors. Wash your hands often using soap and water. If soap and water are not available, use hand  sanitizer. Make sure that all people in your household wash their hands well and often. Keep all follow-up visits as told by your health care provider. This is important. Contact a health care provider if: Your nausea gets worse. Your nausea does not go away after two days. You vomit. You cannot drink fluids without vomiting. You have any of the following: New symptoms. A fever. A headache. Muscle cramps. A rash. Pain while urinating. You feel light-headed or dizzy. Get help right away if: You have pain in your chest, neck, arm, or jaw. You feel extremely weak or you faint. You have vomit that is bright red or looks like coffee grounds. You have bloody or black stools or stools that look like tar. You have a severe headache, a stiff neck, or both. You have severe pain, cramping, or bloating in your abdomen. You have difficulty breathing or are breathing very quickly. Your heart is beating very quickly. Your skin feels cold and clammy. You feel confused. You have signs of dehydration, such as: Dark urine, very little urine, or no urine. Cracked lips. Dry mouth. Sunken eyes. Sleepiness. Weakness. These symptoms may represent a serious problem that is an emergency. Do not wait to see if the symptoms will go away. Get medical help right away. Call your local emergency services (911 in the [place] Do not drive yourself to the hospital. Summary Nausea is the feeling that you have an upset stomach or that you are about to vomit. Nausea on its own is not usually a serious concern, but it may be an early sign of a more  serious medical problem. If vomiting develops, or if you are not able to drink enough fluids, you are at risk of becoming dehydrated. Follow recommendations for eating and drinking and take over-the-counter and prescription medicines only as told by your health care provider. Contact a health care provider right away if your symptoms worsen or you have new symptoms. Keep  all follow-up visits as told by your health care provider. This is important. This information is not intended to replace advice given to you by your health care provider. Make sure you discuss any questions you have with your health care provider. Document Revised: 05/03/2020 Document Reviewed: 07/22/2017 Elsevier Patient Education  2022 [place]. Dysuria Dysuria is pain or discomfort during urination. The pain or discomfort may be felt in the part of the body that drains urine from the bladder (urethra) or in the surrounding tissue of the genitals. The pain may also be felt in the groin area, lower abdomen, or lower back. You may have to urinate frequently or have the sudden feeling that you have to urinate (urgency). Dysuria can affect anyone, but it is more common in females. Dysuria can be caused by many different things, including: Urinary tract infection. Kidney stones or bladder stones. Certain STIs (sexually transmitted infections), such as chlamydia. Dehydration. Inflammation of the tissues of the vagina. Use of certain medicines. Use of certain soaps or scented products that cause irritation. Follow these instructions at home: Medicines Take over-the-counter and prescription medicines only as told by your health care provider. If you were prescribed an antibiotic medicine, take it as told by your health care provider. Do not stop taking the antibiotic even if you start to feel better. Eating and drinking  Drink enough fluid to keep your urine pale yellow. Avoid caffeinated beverages, tea, and alcohol. These beverages can irritate the bladder and make dysuria worse. In males, alcohol may irritate the prostate. General instructions Watch your condition for any changes. Urinate often. Avoid holding urine for long periods of time. If you are female, you should wipe from front to back after urinating or having a bowel movement. Use each piece of toilet paper only once. Empty your  bladder after sex. Keep all follow-up visits. This is important. If you had any tests done to find the cause of dysuria, it is up to you to get your test results. Ask your health care provider, or the department that is doing the test, when your results will be ready. Contact a health care provider if: You have a fever. You develop pain in your back or sides. You have nausea or vomiting. You have blood in your urine. You are not urinating as often as you usually do. Get help right away if: Your pain is severe and not relieved with medicines. You cannot eat or drink without vomiting. You are confused. You have a rapid heartbeat while resting. You have shaking or chills. You feel extremely weak. Summary Dysuria is pain or discomfort while urinating. Many different conditions can lead to dysuria. If you have dysuria, you may have to urinate frequently or have the sudden feeling that you have to urinate (urgency). Watch your condition for any changes. Keep all follow-up visits. Make sure that you urinate often and drink enough fluid to keep your urine pale yellow. This information is not intended to replace advice given to you by your health care provider. Make sure you discuss any questions you have with your health care provider. Document Revised: 09/24/2019 Document Reviewed: 09/24/2019  Elsevier Patient Education  2022 Elsevier Inc.  

## 2020-12-19 NOTE — Assessment & Plan Note (Signed)
Nausea uncontrolled, Zofran 4 mg tablet by mouth, follow up with worsening symptoms, education provided to patient with printed hand out given   RX sent to pharmacy

## 2020-12-19 NOTE — Progress Notes (Signed)
Acute Office Visit  Subjective:    Patient ID: Marilyn Rivera, female    DOB: 01-Sep-1980, 40 y.o.   MRN: [id number]  Chief Complaint  Patient presents with   Urinary Frequency    Urinary Frequency  This is a recurrent problem. The current episode started in the past 7 days. The problem occurs every urination. The problem has been unchanged. The quality of the pain is described as aching. The pain is mild. There has been no fever. Associated symptoms include frequency, nausea, urgency and vomiting. Pertinent negatives include no chills or flank pain. She has tried nothing for the symptoms.    Past Medical History:  Diagnosis Date   Arthritis    Diverticulitis large intestine w/o perforation or abscess w/o bleeding 10/05/2014   Diverticulosis    GERD (gastroesophageal reflux disease)    history of H Pylori   Helicobacter pylori gastritis 09/06/2014   History of kidney stones    Iron deficiency anemia    Kidney stones     Past Surgical History:  Procedure Laterality Date   CENTRAL VENOUS CATHETER INSERTION Right 06/02/2017   Procedure: INSERTION CENTRAL LINE RIGHT INTERNAL JUGULAR;  Surgeon: Virl Cagey, MD;  Location: [facility];  Service: General;  Laterality: Right;   COLONOSCOPY N/A 06/10/2014   Dr. Oneida Alar; redundant sigmoid colon, moderate diverticulosis in the sigmoid and descending colon. small internal hemorrhoids. Next screening at age 41.    ESOPHAGOGASTRODUODENOSCOPY N/A 06/10/2014   Dr. Oneida Alar: H.pylori gastritis s/p treatment with Amoxicillin and Biaxin   KNEE SURGERY Right    PARTIAL COLECTOMY N/A 06/02/2017   Procedure: PARTIAL SIGMOID COLECTOMY (OPEN);  Surgeon: Virl Cagey, MD;  Location: [facility];  Service: General;  Laterality: N/A;   renal calculi removal Left 2010   SKIN LESION EXCISION     over right eyebrow due to wax being left above eye and it seeped down into pore    Family History  Problem Relation Age of Onset   Heart failure Mother     Hypertension Mother    Heart disease Mother    Cancer Other        mother's side of family, breast cancer   Cancer Other        father's side of family, leukemia   Hypertension Father    Stroke Father 69   Diabetes Other    Hypertension Other    Colon cancer Cousin    Healthy Brother    Breast cancer Maternal Aunt 9   Leukemia Paternal Aunt    Kidney disease Maternal Grandmother    Hypertension Maternal Grandmother    Cancer Maternal Grandfather    Leukemia Paternal Grandmother    Cancer Paternal Grandfather     Social History   Socioeconomic History   Marital status: Divorced    Spouse name: Not on file   Number of children: 0   Years of education: Not on file   Highest education level: Not on file  Occupational History   Occupation: [employer], Warehouse manager for resource side  Tobacco Use   Smoking status: Never   Smokeless tobacco: Never  Vaping Use   Vaping Use: Never used  Substance and Sexual Activity   Alcohol use: No   Drug use: No   Sexual activity: Not on file  Other Topics Concern   Not on file  Social History Narrative   Not on file   Social Determinants of Health   Financial Resource Strain: Not on file  Food  Insecurity: Not on file  Transportation Needs: Not on file  Physical Activity: Not on file  Stress: Not on file  Social Connections: Not on file  Intimate Partner Violence: Not on file    Outpatient Medications Prior to Visit  Medication Sig Dispense Refill   fluticasone (FLONASE) 50 MCG/ACT nasal spray Place 2 sprays into both nostrils daily. 16 g 6   montelukast (SINGULAIR) 10 MG tablet Take 1 tablet (10 mg total) by mouth at bedtime. 90 tablet 3   omeprazole (PRILOSEC) 40 MG capsule TAKE (1) CAPSULE DAILY. NEED TO BE SEEN FOR APPOINTMENT 30 capsule 5   amLODipine (NORVASC) 5 MG tablet Take 1 tablet (5 mg total) by mouth daily. (Patient not taking: Reported on 12/19/2020) 90 tablet 3   diclofenac (VOLTAREN) 75 MG EC tablet TAKE 1 TABLET 2  TABLETS DAILY AS NEEDED FOR MODERATE PAIN (Patient not taking: Reported on 12/19/2020) 90 tablet 0   metFORMIN (GLUCOPHAGE) 500 MG tablet Take 1 tablet (500 mg total) by mouth daily with breakfast. (Patient not taking: Reported on 12/19/2020) 90 tablet 3   Semaglutide (RYBELSUS) 7 MG TABS Take 1 tablet by mouth daily. (NEEDS TO BE SEEN BEFORE NEXT REFILL) (Patient not taking: Reported on 12/19/2020) 30 tablet 0   clindamycin (CLEOCIN T) 1 % SWAB Apply to affected areas twice daily 60 each 12   No facility-administered medications prior to visit.    Allergies  Allergen Reactions   Keflex [Cephalexin]     Yeast infections    Coconut Fatty Acids Itching and Rash    Review of Systems  Constitutional:  Negative for chills.  Gastrointestinal:  Positive for nausea and vomiting.  Genitourinary:  Positive for frequency and urgency. Negative for flank pain.  Musculoskeletal: Negative.   Skin:  Negative for rash.  All other systems reviewed and are negative.     Objective:    Physical Exam Vitals reviewed.  Constitutional:      Appearance: Normal appearance. She is obese.  HENT:     Head: Normocephalic.     Nose: Nose normal.  Eyes:     Conjunctiva/sclera: Conjunctivae normal.  Cardiovascular:     Rate and Rhythm: Normal rate and regular rhythm.     Pulses: Normal pulses.     Heart sounds: Normal heart sounds.  Pulmonary:     Effort: Pulmonary effort is normal.     Breath sounds: Normal breath sounds.  Abdominal:     General: Bowel sounds are normal.     Tenderness: There is abdominal tenderness. There is no right CVA tenderness or left CVA tenderness.  Skin:    General: Skin is warm.     Findings: No rash.  Neurological:     Mental Status: She is alert and oriented to person, place, and time.  Psychiatric:        Behavior: Behavior normal.    BP 137/83   Pulse 96   Temp (!) 97.5 F (36.4 C) (Temporal)   SpO2 94%  Wt Readings from Last 3 Encounters:  10/23/20 (!) 433  lb (196.4 kg)  07/17/20 (!) 433 lb (196.4 kg)  06/21/20 (!) 433 lb (196.4 kg)    Health Maintenance Due  Topic Date Due   Pneumococcal Vaccine 17-60 Years old (1 - PCV) Never done   URINE MICROALBUMIN  Never done   PAP SMEAR-Modifier  07/25/2019   COVID-19 Vaccine (4 - Booster for Pfizer series) 03/14/2020    There are no preventive care reminders to display for  this patient.   Lab Results  Component Value Date   TSH 4.630 (H) 06/23/2020   Lab Results  Component Value Date   WBC 8.7 03/19/2018   HGB 11.1 03/19/2018   HCT 36.7 03/19/2018   MCV 77 (L) 03/19/2018   PLT 280 03/19/2018   Lab Results  Component Value Date   NA 139 06/23/2020   K 4.1 06/23/2020   CO2 21 06/23/2020   GLUCOSE 151 (H) 06/23/2020   BUN 13 06/23/2020   CREATININE 0.81 06/23/2020   BILITOT 0.2 06/23/2020   ALKPHOS 90 06/23/2020   AST 20 06/23/2020   ALT 34 (H) 06/23/2020   PROT 6.9 06/23/2020   ALBUMIN 3.9 06/23/2020   CALCIUM 9.0 06/23/2020   ANIONGAP 11 06/05/2017   EGFR 94 06/23/2020   Lab Results  Component Value Date   CHOL 165 06/23/2020   Lab Results  Component Value Date   HDL 43 06/23/2020   Lab Results  Component Value Date   LDLCALC 102 (H) 06/23/2020   Lab Results  Component Value Date   TRIG 107 06/23/2020   Lab Results  Component Value Date   CHOLHDL 3.8 06/23/2020   Lab Results  Component Value Date   HGBA1C 7.3 (H) 06/23/2020       Assessment & Plan:   Problem List Items Addressed This Visit       Other   Nausea    Nausea uncontrolled, Zofran 4 mg tablet by mouth, follow up with worsening symptoms, education provided to patient with printed hand out given   RX sent to pharmacy      Relevant Medications   ondansetron (ZOFRAN) 4 MG tablet   Dysuria - Primary    Completed urinalysis, results pending. Increase hydration Education provided with printed hand out given, Patient knows to follow up with unresolved symptoms      Relevant  Medications   acetaminophen (TYLENOL) 500 MG tablet   Other Relevant Orders   Urinalysis, Routine w reflex microscopic   CULTURE, URINE COMPREHENSIVE   Other Visit Diagnoses     Urinary frequency       Relevant Orders   Urinalysis, Routine w reflex microscopic   CULTURE, URINE COMPREHENSIVE        Meds ordered this encounter  Medications   ondansetron (ZOFRAN) 4 MG tablet    Sig: Take 1 tablet (4 mg total) by mouth every 8 (eight) hours as needed for nausea or vomiting.    Dispense:  20 tablet    Refill:  0    Order Specific Question:   Supervising Provider    Answer:   Jeneen Rinks   acetaminophen (TYLENOL) 500 MG tablet    Sig: Take 1 tablet (500 mg total) by mouth every 6 (six) hours as needed.    Dispense:  30 tablet    Refill:  0    Order Specific Question:   Supervising Provider    Answer:   Claretta Fraise [[id number]]     Ivy Lynn, NP

## 2020-12-19 NOTE — Assessment & Plan Note (Signed)
Completed urinalysis, results pending. Increase hydration Education provided with printed hand out given, Patient knows to follow up with unresolved symptoms

## 2020-12-20 ENCOUNTER — Encounter: Payer: Self-pay | Admitting: Family Medicine

## 2020-12-21 ENCOUNTER — Other Ambulatory Visit: Payer: Self-pay | Admitting: Nurse Practitioner

## 2020-12-21 DIAGNOSIS — R300 Dysuria: Secondary | ICD-10-CM

## 2020-12-21 MED ORDER — NITROFURANTOIN MONOHYD MACRO 100 MG PO CAPS: 100.0000 mg | ORAL_CAPSULE | Freq: Two times a day (BID) | ORAL | 0 refills | Status: DC

## 2020-12-24 ENCOUNTER — Encounter: Payer: Self-pay | Admitting: Family Medicine

## 2020-12-24 LAB — CULTURE, URINE COMPREHENSIVE

## 2020-12-25 ENCOUNTER — Emergency Department (HOSPITAL_COMMUNITY)
Admission: EM | Admit: 2020-12-25 | Discharge: 2020-12-25 | Disposition: A | Discharge status: Home / Self Care | Payer: Managed Care, Other (non HMO) | Attending: Emergency Medicine | Admitting: Emergency Medicine

## 2020-12-25 ENCOUNTER — Encounter (HOSPITAL_COMMUNITY): Payer: Self-pay | Admitting: *Deleted

## 2020-12-25 DIAGNOSIS — D509 Iron deficiency anemia, unspecified: Secondary | ICD-10-CM | POA: Insufficient documentation

## 2020-12-25 DIAGNOSIS — R739 Hyperglycemia, unspecified: Secondary | ICD-10-CM | POA: Insufficient documentation

## 2020-12-25 DIAGNOSIS — L0231 Cutaneous abscess of buttock: Secondary | ICD-10-CM | POA: Insufficient documentation

## 2020-12-25 DIAGNOSIS — R21 Rash and other nonspecific skin eruption: Secondary | ICD-10-CM | POA: Insufficient documentation

## 2020-12-25 DIAGNOSIS — L089 Local infection of the skin and subcutaneous tissue, unspecified: Secondary | ICD-10-CM

## 2020-12-25 LAB — CBC
HCT: 50.5 % — ABNORMAL HIGH (ref 36.0–46.0)
Hemoglobin: 17 g/dL — ABNORMAL HIGH (ref 12.0–15.0)
MCH: 31.7 pg (ref 26.0–34.0)
MCHC: 33.7 g/dL (ref 30.0–36.0)
MCV: 94.2 fL (ref 80.0–100.0)
Platelets: 104 10*3/uL — ABNORMAL LOW (ref 150–400)
RBC: 5.36 MIL/uL — ABNORMAL HIGH (ref 3.87–5.11)
RDW: 14.6 % (ref 11.5–15.5)
WBC: 7.9 10*3/uL (ref 4.0–10.5)
nRBC: 0 % (ref 0.0–0.2)

## 2020-12-25 LAB — BASIC METABOLIC PANEL
Anion gap: 13 (ref 5–15)
BUN: 10 mg/dL (ref 6–20)
CO2: 20 mmol/L — ABNORMAL LOW (ref 22–32)
Calcium: 8.8 mg/dL — ABNORMAL LOW (ref 8.9–10.3)
Chloride: 101 mmol/L (ref 98–111)
Creatinine, Ser: 0.91 mg/dL (ref 0.44–1.00)
GFR, Estimated: >60 mL/min (ref >60)
Glucose, Bld: 439 mg/dL — ABNORMAL HIGH (ref 70–99)
Potassium: 4.7 mmol/L (ref 3.5–5.1)
Sodium: 134 mmol/L — ABNORMAL LOW (ref 135–145)

## 2020-12-25 LAB — CBG MONITORING, ED
Glucose-Capillary: 480 mg/dL — ABNORMAL HIGH (ref 70–99)
Glucose-Capillary: 425 mg/dL — ABNORMAL HIGH (ref 70–99)
Glucose-Capillary: 392 mg/dL — ABNORMAL HIGH (ref 70–99)

## 2020-12-25 MED ORDER — SODIUM CHLORIDE 0.9 % IV BOLUS
1000.0000 mL | Freq: Once | INTRAVENOUS | Status: AC
  Administered 2020-12-25: 1000 mL via INTRAVENOUS

## 2020-12-25 MED ORDER — VANCOMYCIN HCL IN DEXTROSE 1-5 GM/200ML-% IV SOLN
1000.0000 mg | Freq: Once | INTRAVENOUS | Status: AC
  Administered 2020-12-25: 1000 mg via INTRAVENOUS
  Filled 2020-12-25: qty 200

## 2020-12-25 MED ORDER — DOXYCYCLINE HYCLATE 100 MG PO CAPS: ORAL_CAPSULE | ORAL | 0 refills | Status: DC

## 2020-12-25 MED ORDER — METFORMIN HCL 500 MG PO TABS: 500.0000 mg | ORAL_TABLET | Freq: Two times a day (BID) | ORAL | 0 refills | Status: DC

## 2020-12-25 MED ORDER — INSULIN ASPART 100 UNIT/ML IJ SOLN
10.0000 [IU] | Freq: Once | INTRAMUSCULAR | Status: AC
  Administered 2020-12-25: 10 [IU] via SUBCUTANEOUS
  Filled 2020-12-25: qty 1

## 2020-12-25 NOTE — Telephone Encounter (Signed)
Please get her in with the same day provider.

## 2020-12-25 NOTE — ED Provider Notes (Signed)
[facility] EMERGENCY DEPARTMENT Provider Note   CSN: [id number] Arrival date & time: 12/25/20  1144     History Chief Complaint  Patient presents with   Abscess   Hyperglycemia    Marilyn Rivera is a 40 y.o. female.  Patient with history of diabetes.  She has not been taking her metformin for quite some time.  She complains of small infections in her back and her buttocks.  The history is provided by the patient and a friend. No language interpreter was used.  Rash Location: Patient has small tender red areas around her buttocks and back. Quality: blistering   Severity:  Mild Onset quality:  Sudden Timing:  Constant Progression:  Worsening Chronicity:  New Context: not animal contact   Associated symptoms: no abdominal pain, no diarrhea, no fatigue and no headaches       Past Medical History:  Diagnosis Date   Arthritis    Diverticulitis large intestine w/o perforation or abscess w/o bleeding 10/05/2014   Diverticulosis    GERD (gastroesophageal reflux disease)    history of H Pylori   Helicobacter pylori gastritis 09/06/2014   History of kidney stones    Iron deficiency anemia    Kidney stones     Patient Active Problem List   Diagnosis Date Noted   Dysuria 12/19/2020   Contusion of knee, left 11/23/2019   Contusion of calf, left, subsequent encounter 10/21/2019   Degenerative arthritis of right knee 10/21/2019   Elevated blood pressure reading without diagnosis of hypertension 08/24/2018   Allergic conjunctivitis of both eyes 08/24/2018   Pain in left leg 08/24/2018   Colon cancer screening 07/15/2018   Poor venous access    Chronic pain of right knee 05/27/2017   Seasonal allergies 05/14/2017   Iron deficiency anemia 04/15/2017   Diverticulitis 04/14/2017   Rectal bleeding 03/06/2017   Dysphagia    Menometrorrhagia 07/24/2016   GERD (gastroesophageal reflux disease) 60/73/7106   Helicobacter pylori gastritis 09/06/2014   Diverticulitis of  colon    Nausea 05/26/2014   Morbid obesity ([place]) 04/27/2014    Past Surgical History:  Procedure Laterality Date   CENTRAL VENOUS CATHETER INSERTION Right 06/02/2017   Procedure: INSERTION CENTRAL LINE RIGHT INTERNAL JUGULAR;  Surgeon: Virl Cagey, MD;  Location: [facility];  Service: General;  Laterality: Right;   COLONOSCOPY N/A 06/10/2014   Dr. Oneida Alar; redundant sigmoid colon, moderate diverticulosis in the sigmoid and descending colon. small internal hemorrhoids. Next screening at age 35.    ESOPHAGOGASTRODUODENOSCOPY N/A 06/10/2014   Dr. Oneida Alar: H.pylori gastritis s/p treatment with Amoxicillin and Biaxin   KNEE SURGERY Right    PARTIAL COLECTOMY N/A 06/02/2017   Procedure: PARTIAL SIGMOID COLECTOMY (OPEN);  Surgeon: Virl Cagey, MD;  Location: [facility];  Service: General;  Laterality: N/A;   renal calculi removal Left 2010   SKIN LESION EXCISION     over right eyebrow due to wax being left above eye and it seeped down into pore     OB History   No obstetric history on file.     Family History  Problem Relation Age of Onset   Heart failure Mother    Hypertension Mother    Heart disease Mother    Cancer Other        mother's side of family, breast cancer   Cancer Other        father's side of family, leukemia   Hypertension Father    Stroke Father 65   Diabetes  Other    Hypertension Other    Colon cancer Cousin    Healthy Brother    Breast cancer Maternal Aunt 57   Leukemia Paternal Aunt    Kidney disease Maternal Grandmother    Hypertension Maternal Grandmother    Cancer Maternal Grandfather    Leukemia Paternal Grandmother    Cancer Paternal Grandfather     Social History   Tobacco Use   Smoking status: Never   Smokeless tobacco: Never  Vaping Use   Vaping Use: Never used  Substance Use Topics   Alcohol use: No   Drug use: No    Home Medications Prior to Admission medications   Medication Sig Start Date End Date Taking? Authorizing Provider   acetaminophen (TYLENOL) 500 MG tablet Take 1 tablet (500 mg total) by mouth every 6 (six) hours as needed. 12/19/20  Yes Ivy Lynn, NP  doxycycline (VIBRAMYCIN) 100 MG capsule One po bid x 10 days 12/25/20  Yes Milton Ferguson, MD  fluconazole (DIFLUCAN) 150 MG tablet Take by mouth. 12/05/20  Yes [provider]  fluticasone (FLONASE) 50 MCG/ACT nasal spray Place 2 sprays into both nostrils daily. 10/18/20  Yes Hawks, Christy A, FNP  metFORMIN (GLUCOPHAGE) 500 MG tablet Take 1 tablet (500 mg total) by mouth 2 (two) times daily with a meal. 12/25/20  Yes Milton Ferguson, MD  montelukast (SINGULAIR) 10 MG tablet Take 1 tablet (10 mg total) by mouth at bedtime. 10/18/20  Yes Hawks, Christy A, FNP  nitrofurantoin, macrocrystal-monohydrate, (MACROBID) 100 MG capsule Take 1 capsule (100 mg total) by mouth 2 (two) times daily. 1 po BId 12/21/20  Yes Ivy Lynn, NP  omeprazole (PRILOSEC) 40 MG capsule TAKE (1) CAPSULE DAILY. NEED TO BE SEEN FOR APPOINTMENT Patient taking differently: Take 40 mg by mouth daily. 06/08/20  Yes Gottschalk, Ashly M, DO  ondansetron (ZOFRAN) 4 MG tablet Take 1 tablet (4 mg total) by mouth every 8 (eight) hours as needed for nausea or vomiting. 12/19/20  Yes Ivy Lynn, NP  amLODipine (NORVASC) 5 MG tablet Take 1 tablet (5 mg total) by mouth daily. Patient not taking: No sig reported 06/21/20   [name], DO  diclofenac (VOLTAREN) 75 MG EC tablet TAKE 1 TABLET 2 TABLETS DAILY AS NEEDED FOR MODERATE PAIN Patient not taking: No sig reported 10/18/20   Ronnie Doss M, DO  Semaglutide (RYBELSUS) 7 MG TABS Take 1 tablet by mouth daily. (NEEDS TO BE SEEN BEFORE NEXT REFILL) Patient not taking: No sig reported 12/14/20   Ronnie Doss M, DO    Allergies    Keflex [cephalexin] and Coconut fatty acids  Review of Systems   Review of Systems  Constitutional:  Negative for appetite change and fatigue.  HENT:  Negative for congestion, ear  discharge and sinus pressure.   Eyes:  Negative for discharge.  Respiratory:  Negative for cough.   Cardiovascular:  Negative for chest pain.  Gastrointestinal:  Negative for abdominal pain and diarrhea.  Genitourinary:  Negative for frequency and hematuria.  Musculoskeletal:  Negative for back pain.  Skin:  Positive for rash.  Neurological:  Negative for seizures and headaches.  Psychiatric/Behavioral:  Negative for hallucinations.    Physical Exam Updated Vital Signs BP (!) 160/78   Pulse 88   Temp 98.4 F (36.9 C) (Oral)   Resp 19   SpO2 100%   Physical Exam Vitals and nursing note reviewed.  Constitutional:      Appearance: She is well-developed.  HENT:  Head: Normocephalic.     Nose: Nose normal.  Eyes:     General: No scleral icterus.    Conjunctiva/sclera: Conjunctivae normal.  Neck:     Thyroid: No thyromegaly.  Cardiovascular:     Rate and Rhythm: Normal rate and regular rhythm.     Heart sounds: No murmur heard.   No friction rub. No gallop.  Pulmonary:     Breath sounds: No stridor. No wheezing or rales.  Chest:     Chest wall: No tenderness.  Abdominal:     General: There is no distension.     Tenderness: There is no abdominal tenderness. There is no rebound.  Musculoskeletal:        General: Normal range of motion.     Cervical back: Neck supple.  Lymphadenopathy:     Cervical: No cervical adenopathy.  Skin:    Findings: Rash present. No erythema.  Neurological:     Mental Status: She is alert and oriented to person, place, and time.     Motor: No abnormal muscle tone.     Coordination: Coordination normal.  Psychiatric:        Behavior: Behavior normal.    ED Results / Procedures / Treatments   Labs (all labs ordered are listed, but only abnormal results are displayed) Labs Reviewed  BASIC METABOLIC PANEL - Abnormal; Notable for the following components:      Result Value   Sodium 134 (*)    CO2 20 (*)    Glucose, Bld 439 (*)     Calcium 8.8 (*)    All other components within normal limits  CBC - Abnormal; Notable for the following components:   RBC 5.36 (*)    Hemoglobin 17.0 (*)    HCT 50.5 (*)    Platelets 104 (*)    All other components within normal limits  CBG MONITORING, ED - Abnormal; Notable for the following components:   Glucose-Capillary 480 (*)    All other components within normal limits  CBG MONITORING, ED - Abnormal; Notable for the following components:   Glucose-Capillary 425 (*)    All other components within normal limits  CBG MONITORING, ED  CBG MONITORING, ED    EKG None  Radiology No results found.  Procedures Procedures   Medications Ordered in ED Medications  sodium chloride 0.9 % bolus 1,000 mL (0 mLs Intravenous Stopped 12/25/20 1802)  vancomycin (VANCOCIN) IVPB 1000 mg/200 mL premix (0 mg Intravenous Stopped 12/25/20 1802)  insulin aspart (novoLOG) injection 10 Units (10 Units Subcutaneous Given 12/25/20 1625)    ED Course  I have reviewed the triage vital signs and the nursing notes.  Pertinent labs & imaging results that were available during my care of the patient were reviewed by me and considered in my medical decision making (see chart for details).    MDM Rules/Calculators/A&P                           Patient with poor control diabetes.  She will be placed back on metformin.  Patient with small skin infection she is placed on Doxy and will follow up with her PCP Final Clinical Impression(s) / ED Diagnoses Final diagnoses:  Hyperglycemia  Skin infection    Rx / DC Orders ED Discharge Orders          Ordered    metFORMIN (GLUCOPHAGE) 500 MG tablet  2 times daily with meals  12/25/20 1821    doxycycline (VIBRAMYCIN) 100 MG capsule        12/25/20 1821             Milton Ferguson, MD 12/29/20 [phone]

## 2020-12-25 NOTE — ED Notes (Signed)
Wheeled to lobby to car.

## 2020-12-25 NOTE — Discharge Instructions (Signed)
Follow-up with your doctor later this week for recheck. °

## 2020-12-25 NOTE — ED Triage Notes (Signed)
States she has boils over her body for the past 3 weeks, has been going to her doctor and has been on 2 different kinds of antibiotics

## 2020-12-26 ENCOUNTER — Encounter: Payer: Self-pay | Admitting: Family Medicine

## 2020-12-26 ENCOUNTER — Other Ambulatory Visit: Payer: Self-pay | Admitting: Family Medicine

## 2020-12-26 DIAGNOSIS — G8929 Other chronic pain: Secondary | ICD-10-CM

## 2020-12-27 ENCOUNTER — Other Ambulatory Visit: Payer: Self-pay

## 2020-12-27 ENCOUNTER — Ambulatory Visit: Payer: Managed Care, Other (non HMO) | Admitting: Family Medicine

## 2020-12-27 VITALS — BP 145/93 | HR 89 | Temp 97.6°F | Ht 70.0 in | Wt >= 6400 oz

## 2020-12-27 DIAGNOSIS — L089 Local infection of the skin and subcutaneous tissue, unspecified: Secondary | ICD-10-CM

## 2020-12-27 DIAGNOSIS — E11649 Type 2 diabetes mellitus with hypoglycemia without coma: Secondary | ICD-10-CM | POA: Diagnosis not present

## 2020-12-27 LAB — BAYER DCA HB A1C WAIVED: HB A1C (BAYER DCA - WAIVED): 13.6 % — ABNORMAL HIGH (ref 4.8–5.6)

## 2020-12-27 NOTE — Patient Instructions (Signed)
Marilyn Rivera is going to call you to check in on sugars in about 1-2 weeks.  Please keep a daily log of ATLEAST the first morning sugars.  Goal is sugar <150.

## 2020-12-27 NOTE — Progress Notes (Signed)
Subjective: CC:DM, skin lesions PCP: [name], DO ESL:PNPYYF Marilyn Rivera is a 40 y.o. female presenting to clinic today for:  1. Type 2 Diabetes, uncontrolled Patient reports today that her blood sugar was noted to be markedly elevated in the ER when she was evaluated for some skin lesions that have been more prevalent over the last month.  She admits that she has not been compliant with her metformin nor her Rybelsus for over a month.  She has not really monitored blood sugars either.  She has however followed a fairly carb restricted diet and drinks exclusively water.  She was seen in the ER recently for being extremely fatigued, having generalized weakness and they subsequently found that her sugars were well over 400s.  This was a wake-up call to her and she has gone back on her metformin.  She plans to restart her Rybelsus.  Last eye exam: UTD Last foot exam: UTD Last A1c:  Lab Results  Component Value Date   HGBA1C 13.6 (H) 12/27/2020   Nephropathy screen indicated?: UTD Last flu, zoster and/or pneumovax:  Immunization History  Administered Date(s) Administered   Influenza,inj,Quad PF,6+ Mos 01/18/2019   MMR 04/07/1997   Moderna Sars-Covid-2 Vaccination 01/18/2020   PFIZER(Purple Top)SARS-COV-2 Vaccination 05/22/2019, 06/15/2019   Tdap 05/24/2020    Continues to struggle with skin lesions but they do seem to be getting slightly better with the doxycycline.  She has the clindamycin pledgets on hand as well.    ROS: Per HPI  Allergies  Allergen Reactions   Keflex [Cephalexin]     Yeast infections    Coconut Fatty Acids Itching and Rash   Past Medical History:  Diagnosis Date   Arthritis    Diverticulitis large intestine w/o perforation or abscess w/o bleeding 10/05/2014   Diverticulosis    GERD (gastroesophageal reflux disease)    history of H Pylori   Helicobacter pylori gastritis 09/06/2014   History of kidney stones    Iron deficiency anemia     Kidney stones     Current Outpatient Medications:    acetaminophen (TYLENOL) 500 MG tablet, Take 1 tablet (500 mg total) by mouth every 6 (six) hours as needed., Disp: 30 tablet, Rfl: 0   amLODipine (NORVASC) 5 MG tablet, Take 1 tablet (5 mg total) by mouth daily. (Patient not taking: No sig reported), Disp: 90 tablet, Rfl: 3   diclofenac (VOLTAREN) 75 MG EC tablet, TAKE 1 TABLET 2 TABLETS DAILY AS NEEDED FOR MODERATE PAIN (Patient not taking: No sig reported), Disp: 90 tablet, Rfl: 0   doxycycline (VIBRAMYCIN) 100 MG capsule, One po bid x 10 days, Disp: 20 capsule, Rfl: 0   fluconazole (DIFLUCAN) 150 MG tablet, Take by mouth., Disp: , Rfl:    fluticasone (FLONASE) 50 MCG/ACT nasal spray, Place 2 sprays into both nostrils daily., Disp: 16 g, Rfl: 6   metFORMIN (GLUCOPHAGE) 500 MG tablet, Take 1 tablet (500 mg total) by mouth 2 (two) times daily with a meal., Disp: 60 tablet, Rfl: 0   montelukast (SINGULAIR) 10 MG tablet, Take 1 tablet (10 mg total) by mouth at bedtime., Disp: 90 tablet, Rfl: 3   nitrofurantoin, macrocrystal-monohydrate, (MACROBID) 100 MG capsule, Take 1 capsule (100 mg total) by mouth 2 (two) times daily. 1 po BId, Disp: 14 capsule, Rfl: 0   omeprazole (PRILOSEC) 40 MG capsule, TAKE (1) CAPSULE DAILY. NEED TO BE SEEN FOR APPOINTMENT (Patient taking differently: Take 40 mg by mouth daily.), Disp: 30 capsule, Rfl: 5  ondansetron (ZOFRAN) 4 MG tablet, Take 1 tablet (4 mg total) by mouth every 8 (eight) hours as needed for nausea or vomiting., Disp: 20 tablet, Rfl: 0   Semaglutide (RYBELSUS) 7 MG TABS, Take 1 tablet by mouth daily. (NEEDS TO BE SEEN BEFORE NEXT REFILL) (Patient not taking: No sig reported), Disp: 30 tablet, Rfl: 0 Social History   Socioeconomic History   Marital status: Divorced    Spouse name: Not on file   Number of children: 0   Years of education: Not on file   Highest education level: Not on file  Occupational History   Occupation: [employer], Warehouse manager  for resource side  Tobacco Use   Smoking status: Never   Smokeless tobacco: Never  Vaping Use   Vaping Use: Never used  Substance and Sexual Activity   Alcohol use: No   Drug use: No   Sexual activity: Not on file  Other Topics Concern   Not on file  Social History Narrative   Not on file   Social Determinants of Health   Financial Resource Strain: Not on file  Food Insecurity: Not on file  Transportation Needs: Not on file  Physical Activity: Not on file  Stress: Not on file  Social Connections: Not on file  Intimate Partner Violence: Not on file   Family History  Problem Relation Age of Onset   Heart failure Mother    Hypertension Mother    Heart disease Mother    Cancer Other        mother's side of family, breast cancer   Cancer Other        father's side of family, leukemia   Hypertension Father    Stroke Father 19   Diabetes Other    Hypertension Other    Colon cancer Cousin    Healthy Brother    Breast cancer Maternal Aunt 2   Leukemia Paternal Aunt    Kidney disease Maternal Grandmother    Hypertension Maternal Grandmother    Cancer Maternal Grandfather    Leukemia Paternal Grandmother    Cancer Paternal Grandfather     Objective: Office vital signs reviewed. BP (!) 145/93   Pulse 89   Temp 97.6 F (36.4 C)   Ht '5\' 10"'  (1.778 m)   Wt (!) 433 lb (196.4 kg)   SpO2 98%   BMI 62.13 kg/m   Physical Examination:  General: Awake, alert, morbidly obese, No acute distress Skin: Small but draining skin lesion noted in the fold at the left flank and in the inner right gluteal cleft.  It feels like she has a another abscess that is forming on the left upper gluteal cleft but this is not yet draining  Assessment/ Plan: 40 y.o. female   Uncontrolled type 2 diabetes mellitus with hypoglycemia without coma ([place]) - Plan: Bayer DCA Hb A1c Waived  Skin infection  I reviewed her ER notes.  I discussed with her today that her skin lesions are likely  manifestations of hyperglycemia and we discussed that opportunistic infections are very typical of uncontrolled blood sugars.  I stressed the importance of gaining control of her blood sugar.  Her A1c was over 13 today this is a rapid rise from her previous of about 7.  It sounds like noncompliance is largely to do with this rise but I do worry that her pancreas may be starting to fail.  Metformin is being given twice daily.  She is to resume use of Rybelsus and I would like her  to follow-up in the next couple of weeks with Almyra Free to review her blood sugars, which she promises to measure daily.  She will contact me with what meter she has so that we can make sure she has appropriate testing supplies ordered.  I asked that she at least check blood sugars fasting each day and record.  Goal blood sugar is less than 150.  Anticipate need to advance therapy to 14 mg of Rybelsus and likely add additional medicine.  I would certainly like her to strongly consider Mounjaro, specifically for the degree of weight loss that has been found with this particular product.  She is somewhat reluctant to inject medication however.  She asked trial of Rybelsus first and if were not seeing any improvement she would be willing to switch  Orders Placed This Encounter  Procedures   Bayer DCA Hb A1c Waived   No orders of the defined types were placed in this encounter.    [name], DO [place] [phone]

## 2020-12-28 ENCOUNTER — Encounter: Payer: Self-pay | Admitting: Family Medicine

## 2021-01-01 ENCOUNTER — Other Ambulatory Visit: Payer: Self-pay | Admitting: Family Medicine

## 2021-01-01 ENCOUNTER — Encounter: Payer: Self-pay | Admitting: Family Medicine

## 2021-01-01 DIAGNOSIS — E11649 Type 2 diabetes mellitus with hypoglycemia without coma: Secondary | ICD-10-CM

## 2021-01-01 MED ORDER — GLUCOSE BLOOD VI STRP: ORAL_STRIP | 12 refills | Status: AC

## 2021-01-01 MED ORDER — LANCET DEVICE MISC: 12 refills | Status: AC

## 2021-01-03 ENCOUNTER — Ambulatory Visit (INDEPENDENT_AMBULATORY_CARE_PROVIDER_SITE_OTHER): Payer: Managed Care, Other (non HMO) | Admitting: Pharmacist

## 2021-01-03 DIAGNOSIS — E11649 Type 2 diabetes mellitus with hypoglycemia without coma: Secondary | ICD-10-CM

## 2021-01-05 ENCOUNTER — Ambulatory Visit (INDEPENDENT_AMBULATORY_CARE_PROVIDER_SITE_OTHER): Payer: Managed Care, Other (non HMO) | Admitting: *Deleted

## 2021-01-05 ENCOUNTER — Other Ambulatory Visit: Payer: Self-pay

## 2021-01-05 DIAGNOSIS — E11649 Type 2 diabetes mellitus with hypoglycemia without coma: Secondary | ICD-10-CM

## 2021-01-05 MED ORDER — SULFAMETHOXAZOLE-TRIMETHOPRIM 800-160 MG PO TABS: 1.0000 | ORAL_TABLET | Freq: Two times a day (BID) | ORAL | 0 refills | Status: DC

## 2021-01-05 NOTE — Progress Notes (Signed)
Went over instructions for [employer] and pt understands and gave herself the 1st injection

## 2021-01-11 MED ORDER — TIRZEPATIDE 5 MG/0.5ML ~~LOC~~ SOAJ: 5.0000 mg | SUBCUTANEOUS | 3 refills | Status: DC

## 2021-01-11 NOTE — Progress Notes (Signed)
    01/03/2021 Name: Marilyn Rivera MRN: [id number] DOB: Jul 17, 1980   S:  40 yoF Presents for diabetes evaluation, education, and management Patient was referred and last seen by Primary Care Provider last week. Patient reports Diabetes was diagnosed in 07/17/20.  She has struggled with staying on medication and we have also had to work through Teaching laboratory technician affordability: [employer]   Patient reports adherence with medications. Current diabetes medications include: rybelsus 3mg , metformin-->Mounjaro, METFORMING Current hypertension medications include: norvasc Goal 130/80 Current hyperlipidemia medications include: n/a   Patient denies hypoglycemic events.   Patient reported dietary habits: Eats 3 meals/day Discussed meal planning options and Plate method for healthy eating Avoid sugary drinks and desserts Incorporate balanced protein, non starchy veggies, 1 serving of carbohydrate with each meal Increase water intake Increase physical activity as able   Patient-reported exercise habits: gets on elliptical in between visits/working from home      O:  Lab Results  Component Value Date   HGBA1C 13.6 (H) 12/27/2020    Lipid Panel     Component Value Date/Time   CHOL 165 06/23/2020 0808   TRIG 107 06/23/2020 0808   HDL 43 06/23/2020 0808   CHOLHDL 3.8 06/23/2020 0808   CHOLHDL 2.9 04/28/2014 0556   VLDL 12 04/28/2014 0556   LDLCALC 102 (H) 06/23/2020 0808     Home fasting blood sugars: >200  2 hour post-meal/random blood sugars: 200-300.    Clinical Atherosclerotic Cardiovascular Disease (ASCVD): No   The 10-year ASCVD risk score (Arnett DK, et al., 2019) is: 10.7%   Values used to calculate the score:     Age: 40 years     Sex: Female     Is Non-Hispanic African American: Yes     Diabetic: Yes     Tobacco smoker: No     Systolic Blood Pressure: 416 mmHg     Is BP treated: Yes     HDL Cholesterol: 43 mg/dL     Total  Cholesterol: 165 mg/dL    A/P:  Diabetes T2DM currently uncontrolled--A1C 13.6%. Patient is not adherent with medication. Control is suboptimal due to dietary/lifestyle/non-compliance.  -STOP RYBELSUS  -START MOUNJARO 2.5MG  SQ WEEKLY X4 WEEKS  COUNSELED ON PRECAUTIONS NEEDED IF SHE WERE TO START OR BE ON ORAL CONTRACEPTIVES  Denies personal and family history of Medullary thyroid cancer (MTC)  TITRATE DOSE EVERY 4 WEEKS AS TOLERATED (INCREASE BY 2.5MG  EVERY 4 WEEKS, MAX WEEKLY DOSE OF 15MG  WEEKLY)  Called in 5mg  dose so we can work on PA in advance  If not approved on insurance by prior auth--must send to [facility] or [facility] to be covered by new $25 copay card  -Patient scheduled for triage on 01/05/21 for 1st MOUNJARO injection  -CONTINUE METFORMIN  -NEEDS STATIN BUT HESITANT TO START--CONTINUE TO READDRESS   -Extensively discussed pathophysiology of diabetes, recommended lifestyle interventions, dietary effects on blood sugar control  -Counseled on s/sx of and management of hypoglycemia  -Next A1C anticipated 3 months.      Written patient instructions provided.  Total time in counseling 24 minutes.   Regina Eck, PharmD, BCPS Clinical Pharmacist, [place]  [facility] Phone [phone]

## 2021-01-12 MED ORDER — TIRZEPATIDE 5 MG/0.5ML ~~LOC~~ SOAJ: 5.0000 mg | SUBCUTANEOUS | 3 refills | Status: DC

## 2021-01-12 NOTE — Addendum Note (Signed)
Addended by: Thana Ates on: 01/12/2021 08:36 AM   Modules accepted: Orders

## 2021-01-12 NOTE — Progress Notes (Signed)
Mounjaro called into pharmacy.

## 2021-01-24 ENCOUNTER — Encounter: Payer: Self-pay | Admitting: Family Medicine

## 2021-01-26 ENCOUNTER — Encounter: Payer: Self-pay | Admitting: Family Medicine

## 2021-01-26 ENCOUNTER — Ambulatory Visit: Payer: Managed Care, Other (non HMO) | Admitting: Family Medicine

## 2021-01-26 VITALS — BP 153/93 | HR 78 | Temp 97.1°F | Ht 70.0 in | Wt >= 6400 oz

## 2021-01-26 DIAGNOSIS — E11649 Type 2 diabetes mellitus with hypoglycemia without coma: Secondary | ICD-10-CM | POA: Diagnosis not present

## 2021-01-26 DIAGNOSIS — G8929 Other chronic pain: Secondary | ICD-10-CM

## 2021-01-26 DIAGNOSIS — M25561 Pain in right knee: Secondary | ICD-10-CM

## 2021-01-26 DIAGNOSIS — R030 Elevated blood-pressure reading, without diagnosis of hypertension: Secondary | ICD-10-CM | POA: Diagnosis not present

## 2021-01-26 MED ORDER — METHYLPREDNISOLONE ACETATE 40 MG/ML IJ SUSP
40.0000 mg | Freq: Once | INTRAMUSCULAR | Status: AC
  Administered 2021-01-26: 40 mg via INTRAMUSCULAR

## 2021-01-29 NOTE — Progress Notes (Signed)
Subjective: CC: Knee pain, blood sugars PCP: [name], DO XNA:[id number] Marilyn Rivera is a 40 y.o. female presenting to clinic today for:  1.  Right knee pain Patient reports recent, insidious exacerbation of chronic right knee pain.  Last corticosteroid injection was back in August.  The pain causes instability in the right knee but she denies any falls.  She is ambulating independently.  2.  Type 2 diabetes, morbid obesity Patient's A1c noted to be over 13 last visit.  She was transitioned over to [facility] and is currently injecting 5 mg.  She is tolerating the medication without difficulty and is quite satisfied with the impact it had on her appetite and weight.  She is down at least 15 pounds since her last visit.  She is feeling more energetic since blood sugars have been under better control at home and she reports them running somewhere in the upper 90s.  No reports of nausea, vomiting, abdominal pain.   ROS: Per HPI  Allergies  Allergen Reactions   Keflex [Cephalexin]     Yeast infections    Coconut Fatty Acids Itching and Rash   Past Medical History:  Diagnosis Date   Arthritis    Diverticulitis large intestine w/o perforation or abscess w/o bleeding 10/05/2014   Diverticulosis    GERD (gastroesophageal reflux disease)    history of H Pylori   Helicobacter pylori gastritis 09/06/2014   History of kidney stones    Iron deficiency anemia    Kidney stones     Current Outpatient Medications:    acetaminophen (TYLENOL) 500 MG tablet, Take 1 tablet (500 mg total) by mouth every 6 (six) hours as needed., Disp: 30 tablet, Rfl: 0   amLODipine (NORVASC) 5 MG tablet, Take 1 tablet (5 mg total) by mouth daily., Disp: 90 tablet, Rfl: 3   diclofenac (VOLTAREN) 75 MG EC tablet, TAKE 1 TABLET 2 TABLETS DAILY AS NEEDED FOR MODERATE PAIN, Disp: 90 tablet, Rfl: 0   doxycycline (VIBRAMYCIN) 100 MG capsule, One po bid x 10 days, Disp: 20 capsule, Rfl: 0   fluconazole (DIFLUCAN)  150 MG tablet, Take by mouth., Disp: , Rfl:    fluticasone (FLONASE) 50 MCG/ACT nasal spray, Place 2 sprays into both nostrils daily., Disp: 16 g, Rfl: 6   glucose blood test strip, UAD to check sugar twice daily E11.649 (using accuchek), Disp: 100 each, Rfl: 12   Lancet Device MISC, UAD to check sugar twice daily E11.649 (using Accucheck machine), Disp: 100 each, Rfl: 12   metFORMIN (GLUCOPHAGE) 500 MG tablet, Take 1 tablet (500 mg total) by mouth 2 (two) times daily with a meal., Disp: 60 tablet, Rfl: 0   montelukast (SINGULAIR) 10 MG tablet, Take 1 tablet (10 mg total) by mouth at bedtime., Disp: 90 tablet, Rfl: 3   omeprazole (PRILOSEC) 40 MG capsule, TAKE (1) CAPSULE DAILY. NEED TO BE SEEN FOR APPOINTMENT (Patient taking differently: Take 40 mg by mouth daily.), Disp: 30 capsule, Rfl: 5   ondansetron (ZOFRAN) 4 MG tablet, Take 1 tablet (4 mg total) by mouth every 8 (eight) hours as needed for nausea or vomiting., Disp: 20 tablet, Rfl: 0   sulfamethoxazole-trimethoprim (BACTRIM DS) 800-160 MG tablet, Take 1 tablet by mouth 2 (two) times daily., Disp: 14 tablet, Rfl: 0   tirzepatide (MOUNJARO) 2.5 MG/0.5ML Pen, Inject 2.5 mg into the skin once a week., Disp: , Rfl:    tirzepatide (MOUNJARO) 5 MG/0.5ML Pen, Inject 5 mg into the skin once a week. STOP  rybelsus, Disp: 2 mL, Rfl: 3 Social History   Socioeconomic History   Marital status: Divorced    Spouse name: Not on file   Number of children: 0   Years of education: Not on file   Highest education level: Not on file  Occupational History   Occupation: [employer], Warehouse manager for resource side  Tobacco Use   Smoking status: Never   Smokeless tobacco: Never  Vaping Use   Vaping Use: Never used  Substance and Sexual Activity   Alcohol use: No   Drug use: No   Sexual activity: Not on file  Other Topics Concern   Not on file  Social History Narrative   Not on file   Social Determinants of Health   Financial Resource Strain: Not on  file  Food Insecurity: Not on file  Transportation Needs: Not on file  Physical Activity: Not on file  Stress: Not on file  Social Connections: Not on file  Intimate Partner Violence: Not on file   Family History  Problem Relation Age of Onset   Heart failure Mother    Hypertension Mother    Heart disease Mother    Cancer Other        mother's side of family, breast cancer   Cancer Other        father's side of family, leukemia   Hypertension Father    Stroke Father 47   Diabetes Other    Hypertension Other    Colon cancer Cousin    Healthy Brother    Breast cancer Maternal Aunt 40   Leukemia Paternal Aunt    Kidney disease Maternal Grandmother    Hypertension Maternal Grandmother    Cancer Maternal Grandfather    Leukemia Paternal Grandmother    Cancer Paternal Grandfather     Objective: Office vital signs reviewed. BP (!) 153/93   Pulse 78   Temp (!) 97.1 F (36.2 C)   Ht 5\' 10"  (1.778 m)   Wt (!) 416 lb 9.6 oz (189 kg)   SpO2 95%   BMI 59.78 kg/m   Physical Examination:  General: Awake, alert, morbidly obese, No acute distress HEENT: Sclera white Cardio: regular rate and rhythm  Pulm: Normal work of breathing on room air MSK: Gait is antalgic.  No tenderness palpation to the patella, patellar tendon, quad tendon.  No ligamentous laxity.  No gross joint swelling, erythema or warmth appreciated at the knee  JOINT INJECTION:  Patient denies allergy to antiseptics (including iodine) and anesthetics.  Patient HAS a h/o diabetes.  Last Joint injection >3 months ago. NO use of blood thinners/ antiplatelets.  Patient was given informed consent and a signed copy has been placed in the chart. Appropriate time out was taken. Area prepped and draped in usual sterile fashion. Anatomic landmarks were identified and injection site was marked.  Ethyl chloride spray was used to numb the area and 1 cc of methylprednisolone 40 mg/ml plus  3 cc of 1% lidocaine without  epinephrine was injected into the right knee using a(n) anteriolateral approach. The patient tolerated the procedure well and there were no immediate complications. Estimated blood loss is less than 1 cc.  Post procedure instructions were reviewed and handout outlining these instructions were provided to patient.  Assessment/ Plan: 40 y.o. female   Chronic pain of right knee - Plan: methylPREDNISolone acetate (DEPO-MEDROL) injection 40 mg  Uncontrolled type 2 diabetes mellitus with hypoglycemia without coma (HCC)  Morbid obesity (HCC)  Elevated blood-pressure reading without diagnosis  of hypertension  Corticosteroid injection administered to the knee.  Cautioned possible impact on blood sugars.  It sounds like her blood sugars are under much better control with the Mounjaro.  She is had some pretty good weight loss over the last month but this and is down at least 15 pounds.  She seems to be tolerating the medication without difficulty at this time and we will continue to advance her dose as tolerated.  She will contact me in 1 month via MyChart.  Plan to see her back in 2 months for A1c recheck  Suspect blood pressure elevation is due to pain in the knee.  We will recheck this along with her sugar in 2 months.  No orders of the defined types were placed in this encounter.  Meds ordered this encounter  Medications   methylPREDNISolone acetate (DEPO-MEDROL) injection 40 mg     [name], DO [place] [phone]

## 2021-02-12 ENCOUNTER — Telehealth: Payer: Self-pay | Admitting: Family Medicine

## 2021-02-12 NOTE — Telephone Encounter (Signed)
Pt aware to wait the full wake and take her next injection 02/19/2021.

## 2021-02-27 ENCOUNTER — Other Ambulatory Visit: Payer: Self-pay | Admitting: Family Medicine

## 2021-02-27 DIAGNOSIS — G8929 Other chronic pain: Secondary | ICD-10-CM

## 2021-03-23 ENCOUNTER — Other Ambulatory Visit: Payer: Self-pay | Admitting: Family Medicine

## 2021-03-23 DIAGNOSIS — Z1231 Encounter for screening mammogram for malignant neoplasm of breast: Secondary | ICD-10-CM

## 2021-03-28 ENCOUNTER — Other Ambulatory Visit: Payer: Self-pay | Admitting: Family Medicine

## 2021-03-29 ENCOUNTER — Ambulatory Visit: Payer: Managed Care, Other (non HMO) | Admitting: Family Medicine

## 2021-03-29 ENCOUNTER — Encounter: Payer: Self-pay | Admitting: Family Medicine

## 2021-03-29 VITALS — BP 154/96 | HR 85 | Temp 97.3°F | Ht 70.0 in | Wt >= 6400 oz

## 2021-03-29 DIAGNOSIS — L732 Hidradenitis suppurativa: Secondary | ICD-10-CM | POA: Diagnosis not present

## 2021-03-29 DIAGNOSIS — I10 Essential (primary) hypertension: Secondary | ICD-10-CM | POA: Diagnosis not present

## 2021-03-29 DIAGNOSIS — E11628 Type 2 diabetes mellitus with other skin complications: Secondary | ICD-10-CM

## 2021-03-29 MED ORDER — SULFAMETHOXAZOLE-TRIMETHOPRIM 800-160 MG PO TABS: 1.0000 | ORAL_TABLET | Freq: Two times a day (BID) | ORAL | 0 refills | Status: DC

## 2021-03-29 MED ORDER — CLINDAMYCIN PHOSPHATE 1 % EX SWAB: CUTANEOUS | 3 refills | Status: DC

## 2021-03-29 MED ORDER — TIRZEPATIDE 7.5 MG/0.5ML ~~LOC~~ SOAJ: 7.5000 mg | SUBCUTANEOUS | 0 refills | Status: DC

## 2021-03-29 MED ORDER — AMLODIPINE BESYLATE 5 MG PO TABS: 5.0000 mg | ORAL_TABLET | Freq: Every day | ORAL | 3 refills | Status: DC

## 2021-03-29 NOTE — Progress Notes (Signed)
Subjective: CC:DM PCP: [name], DO UVO:[id number] Marilyn Rivera is a 41 y.o. female presenting to clinic today for:  1. Type 2 Diabetes with hypertension, hyperlipidemia:  She has been compliant with the Mounjaro 5 mg weekly.  Next shot due next week.  She continues to try and stay active and has modified her diet quite a bit by limiting sugar.  She reports resolution of fatigue, polydipsia and polyuria.  She continues to have some skin lesions pop up, particularly at the apex of her buttocks and along her right flank but these are much less severe and less frequent than previous.  Denies any blurred vision, chest pain, shortness of breath, vaginitis.  Rare mild nausea, but no vomiting or abdominal pain.  Last eye exam: Needs Last foot exam: Needs Last A1c:  Lab Results  Component Value Date   HGBA1C 13.6 (H) 12/27/2020   Nephropathy screen indicated?:  Needs Last flu, zoster and/or pneumovax:  Immunization History  Administered Date(s) Administered   Influenza, Seasonal, Injecte, Preservative Fre 12/23/2019, 12/07/2020   Influenza,inj,Quad PF,6+ Mos 01/18/2019   MMR 04/07/1997   Moderna Covid-19 Vaccine Bivalent Booster 14yr & up 01/09/2021   Moderna Sars-Covid-2 Vaccination 01/18/2020   PFIZER(Purple Top)SARS-COV-2 Vaccination 05/22/2019, 06/15/2019   Tdap 05/24/2020   2.  Boils Patient reports as above that she continues to get lesions but not in the frequency that they were occurring before she started losing weight and getting her sugar under control.  She is only been utilizing her clindamycin pledgets as needed and has only been using her oral antibiotic as needed.  She did not realize that she should be utilizing her clindamycin pledgets religiously  ROS: Per HPI  Allergies  Allergen Reactions   Keflex [Cephalexin]     Yeast infections    Coconut Fatty Acids Itching and Rash   Past Medical History:  Diagnosis Date   Arthritis    Diverticulitis large  intestine w/o perforation or abscess w/o bleeding 10/05/2014   Diverticulosis    GERD (gastroesophageal reflux disease)    history of H Pylori   Helicobacter pylori gastritis 09/06/2014   History of kidney stones    Iron deficiency anemia    Kidney stones     Current Outpatient Medications:    acetaminophen (TYLENOL) 500 MG tablet, Take 1 tablet (500 mg total) by mouth every 6 (six) hours as needed., Disp: 30 tablet, Rfl: 0   diclofenac (VOLTAREN) 75 MG EC tablet, TAKE 1 TABLET 2 TABLETS DAILY AS NEEDED FOR MODERATE PAIN, Disp: 90 tablet, Rfl: 0   fluconazole (DIFLUCAN) 150 MG tablet, Take by mouth., Disp: , Rfl:    fluticasone (FLONASE) 50 MCG/ACT nasal spray, Place 2 sprays into both nostrils daily., Disp: 16 g, Rfl: 6   glucose blood test strip, UAD to check sugar twice daily E11.649 (using accuchek), Disp: 100 each, Rfl: 12   Lancet Device MISC, UAD to check sugar twice daily E11.649 (using Accucheck machine), Disp: 100 each, Rfl: 12   montelukast (SINGULAIR) 10 MG tablet, Take 1 tablet (10 mg total) by mouth at bedtime., Disp: 90 tablet, Rfl: 3   omeprazole (PRILOSEC) 40 MG capsule, Take 1 capsule (40 mg total) by mouth daily., Disp: 30 capsule, Rfl: 5   ondansetron (ZOFRAN) 4 MG tablet, Take 1 tablet (4 mg total) by mouth every 8 (eight) hours as needed for nausea or vomiting., Disp: 20 tablet, Rfl: 0   sulfamethoxazole-trimethoprim (BACTRIM DS) 800-160 MG tablet, Take 1 tablet by mouth 2 (  two) times daily., Disp: 14 tablet, Rfl: 0   tirzepatide (MOUNJARO) 5 MG/0.5ML Pen, Inject 5 mg into the skin once a week. STOP rybelsus, Disp: 2 mL, Rfl: 3   amLODipine (NORVASC) 5 MG tablet, Take 1 tablet (5 mg total) by mouth daily., Disp: 90 tablet, Rfl: 3   doxycycline (VIBRAMYCIN) 100 MG capsule, One po bid x 10 days (Patient not taking: Reported on 03/29/2021), Disp: 20 capsule, Rfl: 0 Social History   Socioeconomic History   Marital status: Divorced    Spouse name: Not on file   Number of  children: 0   Years of education: Not on file   Highest education level: Not on file  Occupational History   Occupation: Engineer, petroleum, Warehouse manager for resource side  Tobacco Use   Smoking status: Never   Smokeless tobacco: Never  Vaping Use   Vaping Use: Never used  Substance and Sexual Activity   Alcohol use: No   Drug use: No   Sexual activity: Not on file  Other Topics Concern   Not on file  Social History Narrative   Not on file   Social Determinants of Health   Financial Resource Strain: Not on file  Food Insecurity: Not on file  Transportation Needs: Not on file  Physical Activity: Not on file  Stress: Not on file  Social Connections: Not on file  Intimate Partner Violence: Not on file   Family History  Problem Relation Age of Onset   Heart failure Mother    Hypertension Mother    Heart disease Mother    Cancer Other        mother's side of family, breast cancer   Cancer Other        father's side of family, leukemia   Hypertension Father    Stroke Father 62   Diabetes Other    Hypertension Other    Colon cancer Cousin    Healthy Brother    Breast cancer Maternal Aunt 65   Leukemia Paternal Aunt    Kidney disease Maternal Grandmother    Hypertension Maternal Grandmother    Cancer Maternal Grandfather    Leukemia Paternal Grandmother    Cancer Paternal Grandfather     Objective: Office vital signs reviewed. BP (!) 154/96    Pulse 85    Temp (!) 97.3 F (36.3 C)    Ht _0  (1.778 m)    Wt (!) 407 lb 6.4 oz (184.8 kg)    BMI 58.46 kg/m   Physical Examination:  General: Awake, alert, morbidly obese, No acute distress HEENT: MMM, sclera white Cardio: regular rate and rhythm  Pulm:  normal work of breathing on room air Skin: She has a healing area of skin breakdown along the right posterior axillary region that is not actively draining but does have a pore.  Assessment/ Plan: 41 y.o. female   Controlled type 2 diabetes mellitus with other skin  complication, without long-term current use of insulin (HCC) - Plan: Bayer DCA Hb A1c Waived, tirzepatide (MOUNJARO) 7.5 MG/0.5ML Pen  Accelerated hypertension - Plan: amLODipine (NORVASC) 5 MG tablet  Morbid obesity (HCC) - Plan: tirzepatide (MOUNJARO) 7.5 MG/0.5ML Pen  Hidradenitis suppurativa - Plan: sulfamethoxazole-trimethoprim (BACTRIM DS) 800-160 MG tablet, clindamycin (CLEOCIN T) 1 % SWAB  Sugar now controlled.  She had an excellent response to Mounjaro 5 mg with A1c down to 6.7 from over 13.  I congratulated her on her progress.  She is down 30 pounds.  I am going to advance her  to the 7.5 mg to continue promoting weight loss as I do think that will ultimately improve her overall health as well as her joint pain  Has not been compliant with amlodipine for unknown reasons but we will have her go back on that medication.  New Rx sent.  We will see her back in about 1 month for blood pressure recheck  For what seems to be hidradenitis suppurativa, likely precipitated by the insulin resistance and hyper glycemia in the setting of morbid obesity, I have recommended that she proceed with twice daily use of her clindamycin pledgets on the affected areas.  I have renewed the Bactrim DS to have on hand should she need it for flareups.  That we may need to consider switching over to doxycycline for as needed use.  No orders of the defined types were placed in this encounter.  Meds ordered this encounter  Medications   amLODipine (NORVASC) 5 MG tablet    Sig: Take 1 tablet (5 mg total) by mouth daily.    Dispense:  90 tablet    Refill:  3   sulfamethoxazole-trimethoprim (BACTRIM DS) 800-160 MG tablet    Sig: Take 1 tablet by mouth 2 (two) times daily.    Dispense:  14 tablet    Refill:  0   tirzepatide (MOUNJARO) 7.5 MG/0.5ML Pen    Sig: Inject 7.5 mg into the skin once a week.    Dispense:  6 mL    Refill:  0   clindamycin (CLEOCIN T) 1 % SWAB    Sig: Use twice daily to affected areas.  For boils    Dispense:  180 each    Refill:  [place], [place] [phone]

## 2021-03-29 NOTE — Patient Instructions (Signed)
Use the amlodipine every day.  This is for your blood pressure.  I have increased your dose of Mounjaro to 7-1/2 mg.  Your sugar is controlled but we will continue driving the dose as tolerated to help with your weight loss goals  Bring that urine sample back at your next visit.  We will test your kidneys off of that  You should be using this clindamycin wipes twice daily every day to reduce recurrence of infection.  The oral antibiotic is only there for flareups that are not controlled by the clindamycin.

## 2021-03-30 LAB — BAYER DCA HB A1C WAIVED: HB A1C (BAYER DCA - WAIVED): 6.7 % — ABNORMAL HIGH (ref 4.8–5.6)

## 2021-05-01 ENCOUNTER — Encounter: Payer: Self-pay | Admitting: Family Medicine

## 2021-05-01 ENCOUNTER — Ambulatory Visit: Payer: Managed Care, Other (non HMO) | Admitting: Family Medicine

## 2021-05-01 VITALS — BP 131/69 | HR 71 | Temp 98.2°F | Ht 70.0 in | Wt >= 6400 oz

## 2021-05-01 DIAGNOSIS — E1159 Type 2 diabetes mellitus with other circulatory complications: Secondary | ICD-10-CM

## 2021-05-01 DIAGNOSIS — Z91018 Allergy to other foods: Secondary | ICD-10-CM

## 2021-05-01 DIAGNOSIS — E1169 Type 2 diabetes mellitus with other specified complication: Secondary | ICD-10-CM

## 2021-05-01 DIAGNOSIS — I152 Hypertension secondary to endocrine disorders: Secondary | ICD-10-CM

## 2021-05-01 DIAGNOSIS — R110 Nausea: Secondary | ICD-10-CM | POA: Diagnosis not present

## 2021-05-01 DIAGNOSIS — E11628 Type 2 diabetes mellitus with other skin complications: Secondary | ICD-10-CM

## 2021-05-01 MED ORDER — TIRZEPATIDE 10 MG/0.5ML ~~LOC~~ SOAJ: 10.0000 mg | SUBCUTANEOUS | 0 refills | Status: DC

## 2021-05-01 MED ORDER — ONDANSETRON 4 MG PO TBDP: 4.0000 mg | ORAL_TABLET | Freq: Three times a day (TID) | ORAL | 0 refills | Status: DC | PRN

## 2021-05-01 MED ORDER — MECLIZINE HCL 25 MG PO TABS: 25.0000 mg | ORAL_TABLET | Freq: Three times a day (TID) | ORAL | 0 refills | Status: DC | PRN

## 2021-05-01 MED ORDER — EPINEPHRINE 0.3 MG/0.3ML IJ SOAJ: 0.3000 mg | INTRAMUSCULAR | 0 refills | Status: AC | PRN

## 2021-05-02 DIAGNOSIS — E1159 Type 2 diabetes mellitus with other circulatory complications: Secondary | ICD-10-CM | POA: Insufficient documentation

## 2021-05-02 DIAGNOSIS — I152 Hypertension secondary to endocrine disorders: Secondary | ICD-10-CM | POA: Insufficient documentation

## 2021-05-02 NOTE — Progress Notes (Signed)
? ?Subjective: ?CC: Follow-up diabetes, blood pressure ?PCP: Marilyn Norlander, DO ?WNI:[id number] Marilyn Rivera is a 41 y.o. female presenting to clinic today for: ? ?1.  Hypertension with type 2 diabetes ?Patient was reinitiated on her Norvasc 5 mg last visit.  She is compliant with all of her medications including her Mounjaro.  She does note that when she very first started the [facility] 7-1/2 mg she felt kind of poorly but that has since totally resolved and she is tolerating the medication without difficulty.  She is down about 40 pounds since starting the medication.  She is still working towards her 100 pound weight loss goal.  Continues to have bilateral knee pain but not as severe as previous.  Overall she is feeling better than she has in the past.  No reports of abdominal pain, nausea, vomiting, inability to stay hydrated.  No chest pain or shortness of breath reported ? ?2.  Cruise ?Patient is going on a cruise in June to the [place].  Would like to have some medications on hand for seasickness if needed.  She is never been on a cruise ship so is not sure if she will actually be sick but wants to be prepared just in case.  Also worried about potentially being exposed to coconut.  She is had a pretty severe rash and itching in the past with coconut.  No history of anaphylaxis. ? ? ?ROS: Per HPI ? ?Allergies  ?Allergen Reactions  ? Keflex [Cephalexin]   ?  Yeast infections   ? Coconut Fatty Acids Itching and Rash  ? ?Past Medical History:  ?Diagnosis Date  ? Arthritis   ? Diverticulitis large intestine w/o perforation or abscess w/o bleeding 10/05/2014  ? Diverticulosis   ? GERD (gastroesophageal reflux disease)   ? history of H Pylori  ? Helicobacter pylori gastritis 09/06/2014  ? History of kidney stones   ? Iron deficiency anemia   ? Kidney stones   ? ? ?Current Outpatient Medications:  ?  acetaminophen (TYLENOL) 500 MG tablet, Take 1 tablet (500 mg total) by mouth every 6 (six) hours as needed., Disp: 30  tablet, Rfl: 0 ?  amLODipine (NORVASC) 5 MG tablet, Take 1 tablet (5 mg total) by mouth daily., Disp: 90 tablet, Rfl: 3 ?  clindamycin (CLEOCIN T) 1 % SWAB, Use twice daily to affected areas. For boils, Disp: 180 each, Rfl: 3 ?  diclofenac (VOLTAREN) 75 MG EC tablet, TAKE 1 TABLET 2 TABLETS DAILY AS NEEDED FOR MODERATE PAIN, Disp: 90 tablet, Rfl: 0 ?  EPINEPHrine 0.3 mg/0.3 mL IJ SOAJ injection, Inject 0.3 mg into the muscle as needed for anaphylaxis (severe allergic reaction, then call 911)., Disp: 1 each, Rfl: 0 ?  fluconazole (DIFLUCAN) 150 MG tablet, Take by mouth., Disp: , Rfl:  ?  fluticasone (FLONASE) 50 MCG/ACT nasal spray, Place 2 sprays into both nostrils daily., Disp: 16 g, Rfl: 6 ?  glucose blood test strip, UAD to check sugar twice daily E11.649 (using accuchek), Disp: 100 each, Rfl: 12 ?  Lancet Device MISC, UAD to check sugar twice daily E11.649 (using Accucheck machine), Disp: 100 each, Rfl: 12 ?  meclizine (ANTIVERT) 25 MG tablet, Take 1 tablet (25 mg total) by mouth 3 (three) times daily as needed for dizziness., Disp: 30 tablet, Rfl: 0 ?  montelukast (SINGULAIR) 10 MG tablet, Take 1 tablet (10 mg total) by mouth at bedtime., Disp: 90 tablet, Rfl: 3 ?  omeprazole (PRILOSEC) 40 MG capsule, Take 1 capsule (40  mg total) by mouth daily., Disp: 30 capsule, Rfl: 5 ?  ondansetron (ZOFRAN-ODT) 4 MG disintegrating tablet, Take 1 tablet (4 mg total) by mouth every 8 (eight) hours as needed for nausea or vomiting., Disp: 20 tablet, Rfl: 0 ?  sulfamethoxazole-trimethoprim (BACTRIM DS) 800-160 MG tablet, Take 1 tablet by mouth 2 (two) times daily., Disp: 14 tablet, Rfl: 0 ?  tirzepatide (MOUNJARO) 10 MG/0.5ML Pen, Inject 10 mg into the skin once a week. Put on hold. Going to pick up the 7.'5mg'$  this month., Disp: 6 mL, Rfl: 0 ?Social History  ? ?Socioeconomic History  ? Marital status: Divorced  ?  Spouse name: Not on file  ? Number of children: 0  ? Years of education: Not on file  ? Highest education level: Not  on file  ?Occupational History  ? Occupation: Engineer, petroleum, Warehouse manager for resource side  ?Tobacco Use  ? Smoking status: Never  ? Smokeless tobacco: Never  ?Vaping Use  ? Vaping Use: Never used  ?Substance and Sexual Activity  ? Alcohol use: No  ? Drug use: No  ? Sexual activity: Not on file  ?Other Topics Concern  ? Not on file  ?Social History Narrative  ? Not on file  ? ?Social Determinants of Health  ? ?Financial Resource Strain: Not on file  ?Food Insecurity: Not on file  ?Transportation Needs: Not on file  ?Physical Activity: Not on file  ?Stress: Not on file  ?Social Connections: Not on file  ?Intimate Partner Violence: Not on file  ? ?Family History  ?Problem Relation Age of Onset  ? Heart failure Mother   ? Hypertension Mother   ? Heart disease Mother   ? Cancer Other   ?     mother's side of family, breast cancer  ? Cancer Other   ?     father's side of family, leukemia  ? Hypertension Father   ? Stroke Father 17  ? Diabetes Other   ? Hypertension Other   ? Colon cancer Cousin   ? Healthy Brother   ? Breast cancer Maternal Aunt 28  ? Leukemia Paternal Aunt   ? Kidney disease Maternal Grandmother   ? Hypertension Maternal Grandmother   ? Cancer Maternal Grandfather   ? Leukemia Paternal Grandmother   ? Cancer Paternal Grandfather   ? ? ?Objective: ?Office vital signs reviewed. ?BP 131/69   Pulse 71   Temp 98.2 ?F (36.8 ?C) (Temporal)   Ht '5\' 10"'$  (1.778 m)   Wt (!) 401 lb 9.6 oz (182.2 kg)   SpO2 95%   BMI 57.62 kg/m?  ? ?Physical Examination:  ?General: Awake, alert,obese, No acute distress ?Cardio: RRR ?Pulm: normal WOB on room air ?MSK: antalgic, stiff gait. Ambulating independently ? ?Assessment/ Plan: ?41 y.o. female  ? ?Type 2 diabetes mellitus with other specified complication, without long-term current use of insulin ([place]) ? ?Hypertension associated with diabetes ([place]) ? ?Morbid obesity ([place]) - Plan: tirzepatide (MOUNJARO) 10 MG/0.5ML Pen ? ?Nausea - Plan: ondansetron (ZOFRAN-ODT) 4 MG  disintegrating tablet, meclizine (ANTIVERT) 25 MG tablet ? ?Food allergy - Plan: EPINEPHrine 0.3 mg/0.3 mL IJ SOAJ injection ? ?Continue Mounjaro at 7.5 mg this month and then she will escalate to 10 mg next month.  We will plan to see each other back the beginning of June for bilateral knee injections and recheck A1c ? ?Blood pressure now controlled.  No changes ? ?For her trip I am gone ahead and sent over Zofran and Antivert for motion sickness.  We discussed how to use this.  Scopolamine was considered but she was too worried it might make her sleepy ? 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  We discussed how to use this and I have advised her to have the pharmacist demonstrate the pen if needed ? ?No orders of the defined types were placed in this encounter. ? ?Meds ordered this encounter  ?Medications  ? tirzepatide [facility]) 10 MG/0.5ML Pen  ?  Sig: Inject 10 mg into the skin once a week. Put on hold. Going to pick up the 7.'5mg'$  this month.  ?  Dispense:  6 mL  ?  Refill:  0  ? ondansetron (ZOFRAN-ODT) 4 MG disintegrating tablet  ?  Sig: Take 1 tablet (4 mg total) by mouth every 8 (eight) hours as needed for nausea or vomiting.  ?  Dispense:  20 tablet  ?  Refill:  0  ? meclizine (ANTIVERT) 25 MG tablet  ?  Sig: Take 1 tablet (25 mg total) by mouth 3 (three) times daily as needed for dizziness.  ?  Dispense:  30 tablet  ?  Refill:  0  ? EPINEPHrine 0.3 mg/0.3 mL IJ SOAJ injection  ?  Sig: Inject 0.3 mg into the muscle as needed for anaphylaxis (severe allergic reaction, then call 911).  ?  Dispense:  1 each  ?  Refill:  0  ? ? ? ?Marilyn Norlander, DO ?[place] ?[phone] ? ?

## 2021-05-07 ENCOUNTER — Other Ambulatory Visit: Payer: Self-pay | Admitting: Family Medicine

## 2021-05-07 DIAGNOSIS — G8929 Other chronic pain: Secondary | ICD-10-CM

## 2021-05-16 ENCOUNTER — Ambulatory Visit: Payer: Managed Care, Other (non HMO)

## 2021-05-22 ENCOUNTER — Other Ambulatory Visit: Payer: Self-pay

## 2021-05-22 ENCOUNTER — Ambulatory Visit
Admission: RE | Admit: 2021-05-22 | Discharge: 2021-05-22 | Disposition: A | Discharge status: Home / Self Care | Payer: Managed Care, Other (non HMO) | Source: Ambulatory Visit | Attending: Family Medicine | Admitting: Family Medicine

## 2021-05-22 DIAGNOSIS — Z1231 Encounter for screening mammogram for malignant neoplasm of breast: Secondary | ICD-10-CM

## 2021-05-24 ENCOUNTER — Encounter: Payer: Self-pay | Admitting: Family Medicine

## 2021-06-04 ENCOUNTER — Other Ambulatory Visit: Payer: Self-pay | Admitting: Family Medicine

## 2021-06-04 ENCOUNTER — Encounter: Payer: Self-pay | Admitting: Family Medicine

## 2021-06-04 DIAGNOSIS — E1169 Type 2 diabetes mellitus with other specified complication: Secondary | ICD-10-CM

## 2021-06-04 MED ORDER — TIRZEPATIDE 12.5 MG/0.5ML ~~LOC~~ SOAJ: 12.5000 mg | SUBCUTANEOUS | 0 refills | Status: DC

## 2021-06-05 ENCOUNTER — Telehealth: Payer: Self-pay

## 2021-06-05 NOTE — Telephone Encounter (Signed)
No I think it's because the old coupon we were all using has been discontinued.  She can try seeing if there is a new coupon online that might help offset the cost. ?

## 2021-06-05 NOTE — Telephone Encounter (Signed)
Pt called stating mounjaro is costing 150$ at pharmacy. I talked to [facility] and they are saying this is the co pay with insurance, there are thinking because dosage that was sent in?? ?

## 2021-06-13 ENCOUNTER — Ambulatory Visit (INDEPENDENT_AMBULATORY_CARE_PROVIDER_SITE_OTHER): Payer: Managed Care, Other (non HMO) | Admitting: Family Medicine

## 2021-06-13 ENCOUNTER — Telehealth: Payer: Self-pay | Admitting: Family Medicine

## 2021-06-13 ENCOUNTER — Encounter: Payer: Self-pay | Admitting: Family Medicine

## 2021-06-13 DIAGNOSIS — K5904 Chronic idiopathic constipation: Secondary | ICD-10-CM | POA: Diagnosis not present

## 2021-06-13 DIAGNOSIS — R112 Nausea with vomiting, unspecified: Secondary | ICD-10-CM | POA: Diagnosis not present

## 2021-06-13 MED ORDER — TRULANCE 3 MG PO TABS: 3.0000 mg | ORAL_TABLET | Freq: Every day | ORAL | 0 refills | Status: AC

## 2021-06-13 MED ORDER — LINACLOTIDE 72 MCG PO CAPS: 72.0000 ug | ORAL_CAPSULE | Freq: Every day | ORAL | 0 refills | Status: DC

## 2021-06-13 NOTE — Progress Notes (Signed)
Telephone visit ? ?Subjective: ?CC: Abdominal pain, nausea and vomiting ?PCP: [name], DO ?[id number] Marilyn Rivera is a 41 y.o. female calls for telephone consult today. Patient provides verbal consent for consult held via phone. ? ?Due to COVID-19 pandemic this visit was conducted virtually. This visit type was conducted due to national recommendations for restrictions regarding the COVID-19 Pandemic (e.g. social distancing, sheltering in place) in an effort to limit this patient's exposure and mitigate transmission in our community. All issues noted in this document were discussed and addressed.  A physical exam was not performed with this format.  ? ?Location of patient: Home ?Location of provider: [facility] ?Others present for call: None ? ?1. Stomach pain ?Patient reports that she has been constipated for the last 2 days and she has really been straining.  She took stool softeners last night x4. Today, she started having really bad abdominal pain this am.  No bowel movements until this am and she was experiencing nausea and vomiting at the same time.  She took ibuprofen to help with the abdominal pain.  She has not vomiting or had any BM in 2 hours now.  No reports of rectal bleeding.  She has struggled with chronic constipation her entire life and in fact needed enemas and various treatments as a child.  Symptoms seemed obviously be back since advancing Mounjaro.  She reports that she drinks a liter of water per day and eats plenty of fiber. ? ? ?ROS: Per HPI ? ?Allergies  ?Allergen Reactions  ? Keflex [Cephalexin]   ?  Yeast infections   ? Coconut Fatty Acids Itching and Rash  ? ?Past Medical History:  ?Diagnosis Date  ? Arthritis   ? Diverticulitis large intestine w/o perforation or abscess w/o bleeding 10/05/2014  ? Diverticulosis   ? GERD (gastroesophageal reflux disease)   ? history of H Pylori  ? Helicobacter pylori gastritis 09/06/2014  ? History of kidney stones   ? Iron deficiency anemia   ?  Kidney stones   ? ? ?Current Outpatient Medications:  ?  acetaminophen (TYLENOL) 500 MG tablet, Take 1 tablet (500 mg total) by mouth every 6 (six) hours as needed., Disp: 30 tablet, Rfl: 0 ?  amLODipine (NORVASC) 5 MG tablet, Take 1 tablet (5 mg total) by mouth daily., Disp: 90 tablet, Rfl: 3 ?  clindamycin (CLEOCIN T) 1 % SWAB, Use twice daily to affected areas. For boils, Disp: 180 each, Rfl: 3 ?  diclofenac (VOLTAREN) 75 MG EC tablet, TAKE 1 TABLET 2 TABLETS DAILY AS NEEDED FOR MODERATE PAIN, Disp: 90 tablet, Rfl: 0 ?  EPINEPHrine 0.3 mg/0.3 mL IJ SOAJ injection, Inject 0.3 mg into the muscle as needed for anaphylaxis (severe allergic reaction, then call 911)., Disp: 1 each, Rfl: 0 ?  fluconazole (DIFLUCAN) 150 MG tablet, Take by mouth., Disp: , Rfl:  ?  fluticasone (FLONASE) 50 MCG/ACT nasal spray, Place 2 sprays into both nostrils daily., Disp: 16 g, Rfl: 6 ?  glucose blood test strip, UAD to check sugar twice daily E11.649 (using accuchek), Disp: 100 each, Rfl: 12 ?  Lancet Device MISC, UAD to check sugar twice daily E11.649 (using Accucheck machine), Disp: 100 each, Rfl: 12 ?  meclizine (ANTIVERT) 25 MG tablet, Take 1 tablet (25 mg total) by mouth 3 (three) times daily as needed for dizziness., Disp: 30 tablet, Rfl: 0 ?  montelukast (SINGULAIR) 10 MG tablet, Take 1 tablet (10 mg total) by mouth at bedtime., Disp: 90 tablet, Rfl: 3 ?  omeprazole (PRILOSEC) 40 MG capsule, Take 1 capsule (40 mg total) by mouth daily., Disp: 30 capsule, Rfl: 5 ?  ondansetron (ZOFRAN-ODT) 4 MG disintegrating tablet, Take 1 tablet (4 mg total) by mouth every 8 (eight) hours as needed for nausea or vomiting., Disp: 20 tablet, Rfl: 0 ?  sulfamethoxazole-trimethoprim (BACTRIM DS) 800-160 MG tablet, Take 1 tablet by mouth 2 (two) times daily., Disp: 14 tablet, Rfl: 0 ?  tirzepatide (MOUNJARO) 12.5 MG/0.5ML Pen, Inject 12.5 mg into the skin once a week., Disp: 6 mL, Rfl: 0 ? ?Assessment/ Plan: ?41 y.o. female  ? ?Chronic idiopathic  constipation - Plan: Plecanatide (TRULANCE) 3 MG TABS, linaclotide (LINZESS) 72 MCG capsule ? ?Nausea and vomiting, unspecified vomiting type ? ?She has had a chronic constipation I think this is compounded by use of Mounjaro.  It sounds like she had quite a reaction to taking more than what is recommended of the Colace at 1 time.  It sounds like this essentially caused her to have a big blowout.  Since things are settling down and she has Zofran, I have simply recommended that she hydrate very well and monitor for any concerning symptoms or signs that would suggest acute diverticulitis or other intra-abdominal complication.  I have recommended that she allow her belly to settle down.  Continue adequate fiber and hydration.  I am going to place a couple of samples upfront to see if perhaps this might gently allow her for normal bowel movements.  She is to take either Linzess or Trulance only once daily.  She will contact me and let me know which works better for her and I will gladly prescribe it to her. ? ?Start time: 4:36pm ?End time: 4:46pm ? ?Total time spent on patient care (including telephone call/ virtual visit): 9 minutes ? ?[name], DO ?[place] ?[phone] ? ?

## 2021-06-13 NOTE — Telephone Encounter (Signed)
Televist done ?

## 2021-06-13 NOTE — Telephone Encounter (Signed)
Patient calling because she has vomiting and having stomach pain. Refused appt because she only wanted to see Dr Lajuana Ripple. Please call back.  ?

## 2021-06-14 ENCOUNTER — Encounter: Payer: Self-pay | Admitting: Family Medicine

## 2021-06-15 ENCOUNTER — Ambulatory Visit (INDEPENDENT_AMBULATORY_CARE_PROVIDER_SITE_OTHER): Payer: Managed Care, Other (non HMO)

## 2021-06-15 ENCOUNTER — Ambulatory Visit: Payer: Managed Care, Other (non HMO)

## 2021-06-15 ENCOUNTER — Other Ambulatory Visit: Payer: Self-pay | Admitting: Family Medicine

## 2021-06-15 ENCOUNTER — Encounter: Payer: Self-pay | Admitting: Family Medicine

## 2021-06-15 DIAGNOSIS — R112 Nausea with vomiting, unspecified: Secondary | ICD-10-CM | POA: Diagnosis not present

## 2021-06-15 NOTE — Addendum Note (Signed)
Addended by: [name] on: 06/15/2021 04:32 PM ? ? Modules accepted: Orders ? ?

## 2021-06-26 ENCOUNTER — Encounter: Payer: Self-pay | Admitting: Family Medicine

## 2021-07-02 ENCOUNTER — Other Ambulatory Visit: Payer: Self-pay | Admitting: Family Medicine

## 2021-07-02 DIAGNOSIS — G8929 Other chronic pain: Secondary | ICD-10-CM

## 2021-07-10 ENCOUNTER — Ambulatory Visit: Payer: Managed Care, Other (non HMO) | Admitting: Family Medicine

## 2021-07-25 ENCOUNTER — Encounter: Payer: Self-pay | Admitting: Family Medicine

## 2021-07-25 ENCOUNTER — Ambulatory Visit: Payer: Managed Care, Other (non HMO) | Admitting: Family Medicine

## 2021-07-25 VITALS — BP 123/81 | HR 62 | Temp 97.7°F | Resp 20

## 2021-07-25 DIAGNOSIS — M25562 Pain in left knee: Secondary | ICD-10-CM

## 2021-07-25 DIAGNOSIS — F4321 Adjustment disorder with depressed mood: Secondary | ICD-10-CM | POA: Diagnosis not present

## 2021-07-25 DIAGNOSIS — M25561 Pain in right knee: Secondary | ICD-10-CM | POA: Diagnosis not present

## 2021-07-25 DIAGNOSIS — G8929 Other chronic pain: Secondary | ICD-10-CM | POA: Diagnosis not present

## 2021-07-25 MED ORDER — LIDOCAINE-EPINEPHRINE 1 %-1:100000 IJ SOLN
3.0000 mL | Freq: Once | INTRAMUSCULAR | Status: AC
  Administered 2021-07-25: 3 mL

## 2021-07-25 MED ORDER — LORAZEPAM 0.5 MG PO TABS: 0.2500 mg | ORAL_TABLET | Freq: Every evening | ORAL | 0 refills | Status: DC | PRN

## 2021-07-25 MED ORDER — METHYLPREDNISOLONE ACETATE 40 MG/ML IJ SUSP
40.0000 mg | Freq: Once | INTRAMUSCULAR | Status: AC
  Administered 2021-07-25: 40 mg via INTRAMUSCULAR

## 2021-07-25 MED ORDER — LIDOCAINE-EPINEPHRINE (PF) 1 %-1:200000 IJ SOLN: 10.0000 mL | Freq: Once | INTRAMUSCULAR | Status: DC

## 2021-07-25 NOTE — Progress Notes (Signed)
Subjective: CC: Bilateral knee pain PCP: [name], DO WOE:[id number] Marilyn Rivera is a 41 y.o. female presenting to clinic today for:  1.  Chronic bilateral knee pain Patient with ongoing chronic bilateral knee pain.  Last injection was greater than 5 months ago.  Is here for repeat injections  2.  Grief reaction Patient reports that she lost her sister, best friend, mother figure last week to recurrent breast cancer.  She is devastated over this as this was somebody that was extremely important to her.  She "does not know how to live life without her".  She has visited her grave on multiple occasions for several hours at a time and is trying to work through her grief.  She has tried to work with her pastor but felt like her pain was essentially being dismissed because they were not blood relatives.  She has not considered formal grief counseling yet.  She admits to feeling alone.  Is been difficult to continue with work due to her grief but she has continued to work.   ROS: Per HPI  Allergies  Allergen Reactions   Keflex [Cephalexin]     Yeast infections    Coconut Fatty Acids Itching and Rash   Past Medical History:  Diagnosis Date   Arthritis    Diverticulitis large intestine w/o perforation or abscess w/o bleeding 10/05/2014   Diverticulosis    GERD (gastroesophageal reflux disease)    history of H Pylori   Helicobacter pylori gastritis 09/06/2014   History of kidney stones    Iron deficiency anemia    Kidney stones     Current Outpatient Medications:    acetaminophen (TYLENOL) 500 MG tablet, Take 1 tablet (500 mg total) by mouth every 6 (six) hours as needed., Disp: 30 tablet, Rfl: 0   amLODipine (NORVASC) 5 MG tablet, Take 1 tablet (5 mg total) by mouth daily., Disp: 90 tablet, Rfl: 3   clindamycin (CLEOCIN T) 1 % SWAB, Use twice daily to affected areas. For boils, Disp: 180 each, Rfl: 3   diclofenac (VOLTAREN) 75 MG EC tablet, TAKE 1 TABLET 2 TIMES DAILY AS  NEEDED FOR MODERATE PAIN, Disp: 180 tablet, Rfl: 3   EPINEPHrine 0.3 mg/0.3 mL IJ SOAJ injection, Inject 0.3 mg into the muscle as needed for anaphylaxis (severe allergic reaction, then call 911)., Disp: 1 each, Rfl: 0   fluconazole (DIFLUCAN) 150 MG tablet, Take by mouth., Disp: , Rfl:    fluticasone (FLONASE) 50 MCG/ACT nasal spray, Place 2 sprays into both nostrils daily., Disp: 16 g, Rfl: 6   glucose blood test strip, UAD to check sugar twice daily E11.649 (using accuchek), Disp: 100 each, Rfl: 12   Lancet Device MISC, UAD to check sugar twice daily E11.649 (using Accucheck machine), Disp: 100 each, Rfl: 12   LORazepam (ATIVAN) 0.5 MG tablet, Take 0.5-1 tablets (0.25-0.5 mg total) by mouth at bedtime as needed for anxiety or sleep., Disp: 15 tablet, Rfl: 0   meclizine (ANTIVERT) 25 MG tablet, Take 1 tablet (25 mg total) by mouth 3 (three) times daily as needed for dizziness., Disp: 30 tablet, Rfl: 0   montelukast (SINGULAIR) 10 MG tablet, Take 1 tablet (10 mg total) by mouth at bedtime., Disp: 90 tablet, Rfl: 3   omeprazole (PRILOSEC) 40 MG capsule, Take 1 capsule (40 mg total) by mouth daily., Disp: 30 capsule, Rfl: 5   ondansetron (ZOFRAN-ODT) 4 MG disintegrating tablet, Take 1 tablet (4 mg total) by mouth every 8 (eight) hours as  needed for nausea or vomiting., Disp: 20 tablet, Rfl: 0   tirzepatide (MOUNJARO) 12.5 MG/0.5ML Pen, Inject 12.5 mg into the skin once a week., Disp: 6 mL, Rfl: 0   linaclotide (LINZESS) 72 MCG capsule, Take 1 capsule (72 mcg total) by mouth daily before breakfast for 4 days., Disp: 4 capsule, Rfl: 0 Social History   Socioeconomic History   Marital status: Divorced    Spouse name: Not on file   Number of children: 0   Years of education: Not on file   Highest education level: Not on file  Occupational History   Occupation: [employer], Warehouse manager for resource side  Tobacco Use   Smoking status: Never   Smokeless tobacco: Never  Vaping Use   Vaping Use: Never  used  Substance and Sexual Activity   Alcohol use: No   Drug use: No   Sexual activity: Not on file  Other Topics Concern   Not on file  Social History Narrative   Not on file   Social Determinants of Health   Financial Resource Strain: Not on file  Food Insecurity: Not on file  Transportation Needs: Not on file  Physical Activity: Not on file  Stress: Not on file  Social Connections: Not on file  Intimate Partner Violence: Not on file   Family History  Problem Relation Age of Onset   Heart failure Mother    Hypertension Mother    Heart disease Mother    Cancer Other        mother's side of family, breast cancer   Cancer Other        father's side of family, leukemia   Hypertension Father    Stroke Father 54   Diabetes Other    Hypertension Other    Colon cancer Cousin    Healthy Brother    Breast cancer Maternal Aunt 1   Leukemia Paternal Aunt    Kidney disease Maternal Grandmother    Hypertension Maternal Grandmother    Cancer Maternal Grandfather    Leukemia Paternal Grandmother    Cancer Paternal Grandfather     Objective: Office vital signs reviewed. BP 123/81   Pulse 62   Temp 97.7 F (36.5 C) (Temporal)   Resp 20   SpO2 98%   Physical Examination:  General: Awake, alert, morbidly obese, No acute distress MSK: Ambulating independently.  Gait is antalgic.  No tenderness to palpation to the patella, patellar tendon, joint line.  No gross joint effusion or warmth appreciated. Psych: Very tearful.  Patient is sad.  Eye contact poor   JOINT INJECTION:  Patient denies allergy to antiseptics (including iodine) and anesthetics.  Patient has a h/o CONTROLLED type 2 diabetes.  No frequent steroid use, use of blood thinners/ antiplatelets.  Patient was given informed consent and a signed copy has been placed in the chart. Appropriate time out was taken. Area prepped and draped in usual sterile fashion. Anatomic landmarks were identified and injection site  was marked.  Ethyl chloride spray was used to numb the area and 1 cc of methylprednisolone 40 mg/ml plus  3 cc of 1% lidocaine without epinephrine was injected into the right knee using a(n) anteriolateral approach. The patient tolerated the procedure well and there were no immediate complications. Estimated blood loss is less than 5 cc.  Post procedure instructions were reviewed and handout outlining these instructions were provided to patient.  Patient was given informed consent and a signed copy has been placed in the chart. Appropriate time out  was taken. Area prepped and draped in usual sterile fashion. Anatomic landmarks were identified and injection site was marked.  Ethyl chloride spray was used to numb the area and 1 cc of methylprednisolone 40 mg/ml plus  3 cc of 1% lidocaine without epinephrine was injected into the left knee using a(n) anteriorlateral approach. The patient tolerated the procedure well and there were no immediate complications. Estimated blood loss is less than 1 cc.  Post procedure instructions were reviewed and handout outlining these instructions were provided to patient.   Assessment/ Plan: 41 y.o. female   Bilateral chronic knee pain - Plan: methylPREDNISolone acetate (DEPO-MEDROL) injection 40 mg, lidocaine-EPINEPHrine (XYLOCAINE W/EPI) 1 %-1:100000 (with pres) injection 3 mL, methylPREDNISolone acetate (DEPO-MEDROL) injection 40 mg, lidocaine-EPINEPHrine (XYLOCAINE W/EPI) 1 %-1:100000 (with pres) injection 3 mL, DISCONTINUED: lidocaine-EPINEPHrine (PF) (XYLOCAINE-EPINEPHrine) 1 %-1:200000 (PF) injection 10 mL  Morbid obesity (HCC)  Grief reaction - Plan: LORazepam (ATIVAN) 0.5 MG tablet  Bilateral corticosteroid injections administered.  No immediate complications.  She certainly is going through a very understandable grief reaction.  I did recommend she consider formal grief counseling as I do think this would be beneficial.  She certainly needs some rest have  given her some Ativan to utilize sparingly for sleep and to calm her nerves.  We discussed the addictive nature and sedating nature of this medication.  National narcotic database was reviewed and there were no red flags.  I am glad to complete FMLA and/or short-term disability as I do think that she needs at least a couple of weeks off of work to gather herself.  No orders of the defined types were placed in this encounter.  Meds ordered this encounter  Medications   LORazepam (ATIVAN) 0.5 MG tablet    Sig: Take 0.5-1 tablets (0.25-0.5 mg total) by mouth at bedtime as needed for anxiety or sleep.    Dispense:  15 tablet    Refill:  0   methylPREDNISolone acetate (DEPO-MEDROL) injection 40 mg   DISCONTD: lidocaine-EPINEPHrine (PF) (XYLOCAINE-EPINEPHrine) 1 %-1:200000 (PF) injection 10 mL   lidocaine-EPINEPHrine (XYLOCAINE W/EPI) 1 %-1:100000 (with pres) injection 3 mL   methylPREDNISolone acetate (DEPO-MEDROL) injection 40 mg   lidocaine-EPINEPHrine (XYLOCAINE W/EPI) 1 %-1:100000 (with pres) injection 3 mL     Marilyn Mcclenny Windell Moulding, DO [place] [phone]

## 2021-08-07 ENCOUNTER — Encounter: Payer: Self-pay | Admitting: Family Medicine

## 2021-08-09 ENCOUNTER — Ambulatory Visit: Payer: Managed Care, Other (non HMO) | Admitting: Family Medicine

## 2021-08-31 ENCOUNTER — Encounter: Payer: Self-pay | Admitting: Family Medicine

## 2021-09-04 ENCOUNTER — Encounter: Payer: Self-pay | Admitting: Family Medicine

## 2021-09-05 ENCOUNTER — Ambulatory Visit: Payer: Managed Care, Other (non HMO) | Admitting: Family Medicine

## 2021-09-05 ENCOUNTER — Encounter: Payer: Self-pay | Admitting: Family Medicine

## 2021-09-05 DIAGNOSIS — E1169 Type 2 diabetes mellitus with other specified complication: Secondary | ICD-10-CM | POA: Diagnosis not present

## 2021-09-05 DIAGNOSIS — L732 Hidradenitis suppurativa: Secondary | ICD-10-CM | POA: Diagnosis not present

## 2021-09-05 DIAGNOSIS — G479 Sleep disorder, unspecified: Secondary | ICD-10-CM | POA: Diagnosis not present

## 2021-09-05 MED ORDER — FLUCONAZOLE 150 MG PO TABS: 150.0000 mg | ORAL_TABLET | Freq: Once | ORAL | 0 refills | Status: AC

## 2021-09-05 MED ORDER — TRAZODONE HCL 50 MG PO TABS: 25.0000 mg | ORAL_TABLET | Freq: Every evening | ORAL | 3 refills | Status: DC | PRN

## 2021-09-05 MED ORDER — TIRZEPATIDE 15 MG/0.5ML ~~LOC~~ SOAJ: 15.0000 mg | SUBCUTANEOUS | 3 refills | Status: DC

## 2021-09-05 MED ORDER — DOXYCYCLINE HYCLATE 100 MG PO TABS: 100.0000 mg | ORAL_TABLET | Freq: Two times a day (BID) | ORAL | 0 refills | Status: AC

## 2021-09-05 MED ORDER — TIRZEPATIDE 12.5 MG/0.5ML ~~LOC~~ SOAJ: 12.5000 mg | SUBCUTANEOUS | 0 refills | Status: DC

## 2021-09-05 MED ORDER — SULFAMETHOXAZOLE-TRIMETHOPRIM 800-160 MG PO TABS: 1.0000 | ORAL_TABLET | Freq: Two times a day (BID) | ORAL | 0 refills | Status: DC

## 2021-09-05 NOTE — Addendum Note (Signed)
Addended by: [name] on: 09/05/2021 03:24 PM   Modules accepted: Orders

## 2021-09-05 NOTE — Progress Notes (Signed)
Telephone visit  Subjective: CC: Infection PCP: [name], DO [id number] Marilyn Rivera is a 41 y.o. female calls for telephone consult today. Patient provides verbal consent for consult held via phone.  Due to COVID-19 pandemic this visit was conducted virtually. This visit type was conducted due to national recommendations for restrictions regarding the COVID-19 Pandemic (e.g. social distancing, sheltering in place) in an effort to limit this patient's exposure and mitigate transmission in our community. All issues noted in this document were discussed and addressed.  A physical exam was not performed with this format.   Location of patient: Home Location of provider: [facility] Others present for call: None  1.  Hidradenitis suppurativa with recurrent abscess formation She reports recurrence of boils under her arm and on her back.  She has another in between her buttocks.  BGs are 101.  She has lesions have been present for about a week or so.  She continues to use the clindamycin platelets as before.  She thinks she probably needs to get back on Septra since these are not getting better with home therapies.  No fevers reported.  2.  Reactive depression/grief Patient continues to have depressive symptoms and continues to grieve the loss of her sister.  She is trying to work through this however and has really been working with her friends and family.  She attended Bible study recently and the sermon really spoke to her.  She is having difficulty with sleep due to her symptoms though.  3.  Type 2 diabetes She continues to have difficulty finding the 12.5 mg of Mounjaro.  She has been out of this for about a month and had been bridging with [employer].  She is called around but has not really found it elsewhere.  She wants to get back on it because it has controlled her appetite and blood sugar extremely well and she feels like she is getting off of target since she has been off of the  medicine  ROS: Per HPI  Allergies  Allergen Reactions   Keflex [Cephalexin]     Yeast infections    Coconut Fatty Acids Itching and Rash    Past Medical History:  Diagnosis Date   Arthritis    Diverticulitis large intestine w/o perforation or abscess w/o bleeding 10/05/2014   Diverticulosis    GERD (gastroesophageal reflux disease)    history of H Pylori   Helicobacter pylori gastritis 09/06/2014   History of kidney stones    Iron deficiency anemia    Kidney stones     Current Outpatient Medications:    acetaminophen (TYLENOL) 500 MG tablet, Take 1 tablet (500 mg total) by mouth every 6 (six) hours as needed., Disp: 30 tablet, Rfl: 0   amLODipine (NORVASC) 5 MG tablet, Take 1 tablet (5 mg total) by mouth daily., Disp: 90 tablet, Rfl: 3   clindamycin (CLEOCIN T) 1 % SWAB, Use twice daily to affected areas. For boils, Disp: 180 each, Rfl: 3   diclofenac (VOLTAREN) 75 MG EC tablet, TAKE 1 TABLET 2 TIMES DAILY AS NEEDED FOR MODERATE PAIN, Disp: 180 tablet, Rfl: 3   EPINEPHrine 0.3 mg/0.3 mL IJ SOAJ injection, Inject 0.3 mg into the muscle as needed for anaphylaxis (severe allergic reaction, then call 911)., Disp: 1 each, Rfl: 0   fluconazole (DIFLUCAN) 150 MG tablet, Take by mouth., Disp: , Rfl:    fluticasone (FLONASE) 50 MCG/ACT nasal spray, Place 2 sprays into both nostrils daily., Disp: 16 g, Rfl: 6  glucose blood test strip, UAD to check sugar twice daily E11.649 (using accuchek), Disp: 100 each, Rfl: 12   Lancet Device MISC, UAD to check sugar twice daily E11.649 (using Accucheck machine), Disp: 100 each, Rfl: 12   linaclotide (LINZESS) 72 MCG capsule, Take 1 capsule (72 mcg total) by mouth daily before breakfast for 4 days., Disp: 4 capsule, Rfl: 0   LORazepam (ATIVAN) 0.5 MG tablet, Take 0.5-1 tablets (0.25-0.5 mg total) by mouth at bedtime as needed for anxiety or sleep., Disp: 15 tablet, Rfl: 0   meclizine (ANTIVERT) 25 MG tablet, Take 1 tablet (25 mg total) by mouth 3  (three) times daily as needed for dizziness., Disp: 30 tablet, Rfl: 0   montelukast (SINGULAIR) 10 MG tablet, Take 1 tablet (10 mg total) by mouth at bedtime., Disp: 90 tablet, Rfl: 3   omeprazole (PRILOSEC) 40 MG capsule, Take 1 capsule (40 mg total) by mouth daily., Disp: 30 capsule, Rfl: 5   ondansetron (ZOFRAN-ODT) 4 MG disintegrating tablet, Take 1 tablet (4 mg total) by mouth every 8 (eight) hours as needed for nausea or vomiting., Disp: 20 tablet, Rfl: 0   tirzepatide (MOUNJARO) 12.5 MG/0.5ML Pen, Inject 12.5 mg into the skin once a week., Disp: 6 mL, Rfl: 0  Assessment/ Plan: 41 y.o. female   Type 2 diabetes mellitus with other specified complication, without long-term current use of insulin (HCC) - Plan: tirzepatide (MOUNJARO) 15 MG/0.5ML Pen, tirzepatide (MOUNJARO) 12.5 MG/0.5ML Pen  Hidradenitis suppurativa - Plan: sulfamethoxazole-trimethoprim (BACTRIM DS) 800-160 MG tablet  Sleep difficulties - Plan: traZODone (DESYREL) 50 MG tablet  Mounjaro 12.5 mg sent to pharmacy at [facility] in the 15 mg placed on file at her preferred pharmacy.  Due for sugar checkup.  Would like her to schedule this at her earliest convenience.  Of note last A1c was 6.7 in February  Septra has been sent in for pharmacy as this is worked well for her in the past.  I think that ongoing weight loss and lifestyle modification will continue to improve this issue  Trazodone sent in for the sleep difficulties she is experiencing with grief.  Would like her to follow-up in the next 4 to 6 weeks for recheck  Start time: 9:02am End time: 9:22a Total time spent on patient care (including telephone call/ virtual visit): 20 minutes  [place], [place] [phone]

## 2021-10-16 ENCOUNTER — Other Ambulatory Visit: Payer: Self-pay | Admitting: Family Medicine

## 2021-10-16 DIAGNOSIS — R110 Nausea: Secondary | ICD-10-CM

## 2021-10-18 ENCOUNTER — Encounter: Payer: Self-pay | Admitting: Family Medicine

## 2021-10-23 ENCOUNTER — Ambulatory Visit (INDEPENDENT_AMBULATORY_CARE_PROVIDER_SITE_OTHER): Payer: Managed Care, Other (non HMO) | Admitting: Family Medicine

## 2021-10-23 VITALS — BP 132/82 | HR 61 | Temp 97.4°F | Ht 70.0 in | Wt 376.0 lb

## 2021-10-23 DIAGNOSIS — F4321 Adjustment disorder with depressed mood: Secondary | ICD-10-CM

## 2021-10-23 DIAGNOSIS — I152 Hypertension secondary to endocrine disorders: Secondary | ICD-10-CM

## 2021-10-23 DIAGNOSIS — E1159 Type 2 diabetes mellitus with other circulatory complications: Secondary | ICD-10-CM | POA: Diagnosis not present

## 2021-10-23 DIAGNOSIS — G8929 Other chronic pain: Secondary | ICD-10-CM

## 2021-10-23 DIAGNOSIS — M25561 Pain in right knee: Secondary | ICD-10-CM

## 2021-10-23 DIAGNOSIS — E1169 Type 2 diabetes mellitus with other specified complication: Secondary | ICD-10-CM

## 2021-10-23 DIAGNOSIS — M25562 Pain in left knee: Secondary | ICD-10-CM | POA: Diagnosis not present

## 2021-10-23 LAB — BAYER DCA HB A1C WAIVED: HB A1C (BAYER DCA - WAIVED): 5.5 % (ref 4.8–5.6)

## 2021-10-23 MED ORDER — METHYLPREDNISOLONE ACETATE 40 MG/ML IJ SUSP
40.0000 mg | Freq: Once | INTRAMUSCULAR | Status: AC
  Administered 2021-10-23: 40 mg via INTRAMUSCULAR

## 2021-10-23 NOTE — Progress Notes (Signed)
Subjective: CC: Bilateral knee pain PCP: [name], DO UXL:[id number] Marilyn Rivera is a 41 y.o. female presenting to clinic today for:  1.  Chronic knee pain Patient reports increased frequency chronic knee pain.  Her corticosteroid injection is wearing off.  Last injection was exactly 3 months ago.  She reports some instability.  She is lost a total of 60 pounds since November of last year.  She is hopeful that this will allow the knee pain to get better.  She has not been back to see orthopedics.  2.  Type 2 diabetes with hypertension Patient is compliant with Mounjaro 12.5 mg subcutaneously each week She uses her Norvasc for blood pressure control and has lost weight as above.  No chest pain, shortness of breath reported  3.  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  She recently celebrated her sister's birthday and notes that it was hard for her.  She has establish care with a counselor but is only seen her 1 time because she has had difficulty arranging an appointment.  Continues to have some intermittent issues with sleep.  Has trazodone and Ativan on hand if needed   ROS: Per HPI  Allergies  Allergen Reactions   Keflex [Cephalexin]     Yeast infections    Coconut Fatty Acids Itching and Rash   Past Medical History:  Diagnosis Date   Arthritis    Diverticulitis large intestine w/o perforation or abscess w/o bleeding 10/05/2014   Diverticulosis    GERD (gastroesophageal reflux disease)    history of H Pylori   Helicobacter pylori gastritis 09/06/2014   History of kidney stones    Iron deficiency anemia    Kidney stones     Current Outpatient Medications:    acetaminophen (TYLENOL) 500 MG tablet, Take 1 tablet (500 mg total) by mouth every 6 (six) hours as needed., Disp: 30 tablet, Rfl: 0   amLODipine (NORVASC) 5 MG tablet, Take 1 tablet (5 mg total) by mouth daily., Disp: 90 tablet, Rfl: 3   clindamycin (CLEOCIN T) 1 %  SWAB, Use twice daily to affected areas. For boils, Disp: 180 each, Rfl: 3   diclofenac (VOLTAREN) 75 MG EC tablet, TAKE 1 TABLET 2 TIMES DAILY AS NEEDED FOR MODERATE PAIN, Disp: 180 tablet, Rfl: 3   EPINEPHrine 0.3 mg/0.3 mL IJ SOAJ injection, Inject 0.3 mg into the muscle as needed for anaphylaxis (severe allergic reaction, then call 911)., Disp: 1 each, Rfl: 0   fluticasone (FLONASE) 50 MCG/ACT nasal spray, Place 2 sprays into both nostrils daily., Disp: 16 g, Rfl: 6   glucose blood test strip, UAD to check sugar twice daily E11.649 (using accuchek), Disp: 100 each, Rfl: 12   Lancet Device MISC, UAD to check sugar twice daily E11.649 (using Accucheck machine), Disp: 100 each, Rfl: 12   linaclotide (LINZESS) 72 MCG capsule, Take 1 capsule (72 mcg total) by mouth daily before breakfast for 4 days., Disp: 4 capsule, Rfl: 0   LORazepam (ATIVAN) 0.5 MG tablet, Take 0.5-1 tablets (0.25-0.5 mg total) by mouth at bedtime as needed for anxiety or sleep., Disp: 15 tablet, Rfl: 0   meclizine (ANTIVERT) 25 MG tablet, Take 1 tablet (25 mg total) by mouth 3 (three) times daily as needed for dizziness., Disp: 30 tablet, Rfl: 0   montelukast (SINGULAIR) 10 MG tablet, Take 1 tablet (10 mg total) by mouth at bedtime., Disp: 90 tablet, Rfl: 3   omeprazole (PRILOSEC) 40  MG capsule, Take 1 capsule (40 mg total) by mouth daily., Disp: 30 capsule, Rfl: 5   ondansetron (ZOFRAN) 4 MG tablet, TAKE ONE TABLET EVERY 8 HOURS AS NEEDED FOR NAUSEA AND VOMITING, Disp: 20 tablet, Rfl: 0   ondansetron (ZOFRAN-ODT) 4 MG disintegrating tablet, Take 1 tablet (4 mg total) by mouth every 8 (eight) hours as needed for nausea or vomiting., Disp: 20 tablet, Rfl: 0   tirzepatide (MOUNJARO) 12.5 MG/0.5ML Pen, Inject 12.5 mg into the skin once a week., Disp: 6 mL, Rfl: 0   tirzepatide (MOUNJARO) 15 MG/0.5ML Pen, Inject 15 mg into the skin once a week., Disp: 6 mL, Rfl: 3   traZODone (DESYREL) 50 MG tablet, Take 0.5-2 tablets (25-100 mg total)  by mouth at bedtime as needed for sleep., Disp: 60 tablet, Rfl: 3 Social History   Socioeconomic History   Marital status: Divorced    Spouse name: Not on file   Number of children: 0   Years of education: Not on file   Highest education level: Not on file  Occupational History   Occupation: Engineer, petroleum, Warehouse manager for resource side  Tobacco Use   Smoking status: Never   Smokeless tobacco: Never  Vaping Use   Vaping Use: Never used  Substance and Sexual Activity   Alcohol use: No   Drug use: No   Sexual activity: Not on file  Other Topics Concern   Not on file  Social History Narrative   Not on file   Social Determinants of Health   Financial Resource Strain: Not on file  Food Insecurity: Not on file  Transportation Needs: Not on file  Physical Activity: Not on file  Stress: Not on file  Social Connections: Not on file  Intimate Partner Violence: Not on file   Family History  Problem Relation Age of Onset   Heart failure Mother    Hypertension Mother    Heart disease Mother    Cancer Other        mother's side of family, breast cancer   Cancer Other        father's side of family, leukemia   Hypertension Father    Stroke Father 19   Diabetes Other    Hypertension Other    Colon cancer Cousin    Healthy Brother    Breast cancer Maternal Aunt 52   Leukemia Paternal Aunt    Kidney disease Maternal Grandmother    Hypertension Maternal Grandmother    Cancer Maternal Grandfather    Leukemia Paternal Grandmother    Cancer Paternal Grandfather     Objective: Office vital signs reviewed. BP 132/82   Pulse 61   Temp (!) 97.4 F (36.3 C)   Ht _0  (1.778 m)   Wt (!) 376 lb (170.6 kg)   SpO2 99%   BMI 53.95 kg/m   Physical Examination:  General: Awake, alert, morbidly obese, No acute distress HEENT: Sclera white.  Moist mucous membranes Cardio: regular rate and rhythm, S1S2 heard, no murmurs appreciated Pulm: clear to auscultation bilaterally, no  wheezes, rhonchi or rales; normal work of breathing on room air MSK: Antalgic gait and station.  No tenderness palpation to the patella.  She has mild joint line tenderness.  No ligamentous laxity.  No soft tissue swelling.  No joint effusions.  No warmth or erythema Psych: Sad when talking about her sister.  Thought processes linear.  JOINT INJECTION:  Patient denies allergy to antiseptics (including iodine) and anesthetics.  Patient DOES have a h/o  diabetes, frequent steroid use (gets shots q67m. No use of blood thinners/ antiplatelets.  Patient was given informed consent and a signed copy has been placed in the chart. Appropriate time out was taken. Area prepped and draped in usual sterile fashion. Anatomic landmarks were identified and injection site was marked.  Ethyl chloride spray was used to numb the area and 1 cc of methylprednisolone 40 mg/ml plus  3 cc of 1% lidocaine without epinephrine was injected into the right knee using a(n) lateral approach. The patient tolerated the procedure well and there were no immediate complications. Estimated blood loss is less than 2 cc.  Post procedure instructions were reviewed and handout outlining these instructions were provided to patient.  JOINT INJECTION:  Patient was given informed consent and a signed copy has been placed in the chart. Appropriate time out was taken. Area prepped and draped in usual sterile fashion. Anatomic landmarks were identified and injection site was marked.  Ethyl chloride spray was used to numb the area and 1 cc of methylprednisolone 40 mg/ml plus  3 cc of 1% lidocaine without epinephrine was injected into the left knee using a(n) anteriomedial approach. The patient tolerated the procedure well and there were no immediate complications. Estimated blood loss is less than 2 cc.  Post procedure instructions were reviewed and handout outlining these instructions were provided to patient.  Assessment/ Plan: 41y.o. female    Bilateral chronic knee pain  Type 2 diabetes mellitus with other specified complication, without long-term current use of insulin ([facility] - Plan: Bayer DCA Hb A1c Waived, CMP14+EGFR, LDL Cholesterol, Direct  Hypertension associated with diabetes ([facility] - Plan: Bayer DCA Hb A1c Waived, CMP14+EGFR, LDL Cholesterol, Direct  Grief reaction  Corticosteroid injection bilaterally administered.  No immediate complications.  Continue current regimen.  A1c, direct LDL and CMP were collected today.  Follow-up in 3 months, sooner if concerns arise  Blood pressure well controlled.  No changes.  Continue lifestyle modification and weight loss  Agree with seeing a counselor.  Encouraged her to try and set up [facility]  No orders of the defined types were placed in this encounter.  No orders of the defined types were placed in this encounter.    AJanora Norlander DO [phone]

## 2021-10-23 NOTE — Addendum Note (Signed)
Addended by: Alphonzo Dublin on: 10/23/2021 02:03 PM   Modules accepted: Orders

## 2021-10-24 LAB — CMP14+EGFR
ALT: 18 IU/L (ref 0–32)
AST: 13 IU/L (ref 0–40)
Albumin/Globulin Ratio: 1.4 (ref 1.2–2.2)
Albumin: 4.1 g/dL (ref 3.9–4.9)
Alkaline Phosphatase: 77 IU/L (ref 44–121)
BUN/Creatinine Ratio: 15 (ref 9–23)
BUN: 12 mg/dL (ref 6–24)
Bilirubin Total: 0.2 mg/dL (ref 0.0–1.2)
CO2: 22 mmol/L (ref 20–29)
Calcium: 9 mg/dL (ref 8.7–10.2)
Chloride: 107 mmol/L — ABNORMAL HIGH (ref 96–106)
Creatinine, Ser: 0.82 mg/dL (ref 0.57–1.00)
Globulin, Total: 2.9 g/dL (ref 1.5–4.5)
Glucose: 78 mg/dL (ref 70–99)
Potassium: 4.4 mmol/L (ref 3.5–5.2)
Sodium: 143 mmol/L (ref 134–144)
Total Protein: 7 g/dL (ref 6.0–8.5)
eGFR: 92 mL/min/{1.73_m2} (ref >59)

## 2021-10-24 LAB — LDL CHOLESTEROL, DIRECT: LDL Direct: 89 mg/dL (ref 0–99)

## 2021-10-31 ENCOUNTER — Other Ambulatory Visit: Payer: Self-pay | Admitting: Family Medicine

## 2021-10-31 ENCOUNTER — Other Ambulatory Visit: Payer: Self-pay | Admitting: Family

## 2021-11-19 ENCOUNTER — Encounter: Payer: Self-pay | Admitting: Family Medicine

## 2021-11-21 NOTE — Telephone Encounter (Signed)
Anyone see a flyer for me last week?  I don't have one in my box.

## 2021-11-21 NOTE — Telephone Encounter (Signed)
No one up front has seen it.

## 2021-11-28 ENCOUNTER — Encounter: Payer: Self-pay | Admitting: Family Medicine

## 2021-11-28 ENCOUNTER — Ambulatory Visit: Payer: Managed Care, Other (non HMO) | Admitting: Family Medicine

## 2021-11-28 VITALS — BP 160/85 | HR 76 | Temp 98.1°F | Ht 70.0 in | Wt 375.2 lb

## 2021-11-28 DIAGNOSIS — Z23 Encounter for immunization: Secondary | ICD-10-CM

## 2021-11-28 DIAGNOSIS — I152 Hypertension secondary to endocrine disorders: Secondary | ICD-10-CM | POA: Diagnosis not present

## 2021-11-28 DIAGNOSIS — L02411 Cutaneous abscess of right axilla: Secondary | ICD-10-CM

## 2021-11-28 DIAGNOSIS — E1159 Type 2 diabetes mellitus with other circulatory complications: Secondary | ICD-10-CM | POA: Diagnosis not present

## 2021-11-28 DIAGNOSIS — L732 Hidradenitis suppurativa: Secondary | ICD-10-CM | POA: Diagnosis not present

## 2021-11-28 MED ORDER — CLINDAMYCIN PHOSPHATE 1 % EX SWAB: CUTANEOUS | 3 refills | Status: DC

## 2021-11-28 MED ORDER — SULFAMETHOXAZOLE-TRIMETHOPRIM 800-160 MG PO TABS: 1.0000 | ORAL_TABLET | Freq: Two times a day (BID) | ORAL | 2 refills | Status: DC

## 2021-11-28 NOTE — Progress Notes (Signed)
Subjective: CC: Hidradenitis suppurativa PCP: [name], DO UDJ:[id number] Marilyn Rivera is a 41 y.o. female presenting to clinic today for:  1.  Hydradenitis suppurativa Patient notes that she has a new lesion on her right axilla and a couple in the mons pubis.  They are not actively draining in the axillary region but she notes tenderness to touch.  She has been utilizing her clindamycin swabs.  She needs a renewal on the Septra, which seems to be the only thing that actually clears these up.  She denies any fevers, nausea or vomiting.  2.  Diabetes associated with hypertension and hyperlipidemia Patient sugars has been controlled.  She does not monitor her blood pressures regularly and notes that she forgot to take her blood pressure medication today.  No chest pain, shortness of breath or dizziness reported   ROS: Per HPI  Allergies  Allergen Reactions   Keflex [Cephalexin]     Yeast infections    Coconut Fatty Acids Itching and Rash   Past Medical History:  Diagnosis Date   Arthritis    Diverticulitis large intestine w/o perforation or abscess w/o bleeding 10/05/2014   Diverticulosis    GERD (gastroesophageal reflux disease)    history of H Pylori   Helicobacter pylori gastritis 09/06/2014   History of kidney stones    Iron deficiency anemia    Kidney stones     Current Outpatient Medications:    acetaminophen (TYLENOL) 500 MG tablet, Take 1 tablet (500 mg total) by mouth every 6 (six) hours as needed., Disp: 30 tablet, Rfl: 0   amLODipine (NORVASC) 5 MG tablet, Take 1 tablet (5 mg total) by mouth daily., Disp: 90 tablet, Rfl: 3   clindamycin (CLEOCIN T) 1 % SWAB, Use twice daily to affected areas. For boils, Disp: 180 each, Rfl: 3   diclofenac (VOLTAREN) 75 MG EC tablet, TAKE 1 TABLET 2 TIMES DAILY AS NEEDED FOR MODERATE PAIN, Disp: 180 tablet, Rfl: 3   EPINEPHrine 0.3 mg/0.3 mL IJ SOAJ injection, Inject 0.3 mg into the muscle as needed for anaphylaxis (severe  allergic reaction, then call 911)., Disp: 1 each, Rfl: 0   fluticasone (FLONASE) 50 MCG/ACT nasal spray, Place 2 sprays into both nostrils daily., Disp: 16 g, Rfl: 6   glucose blood test strip, UAD to check sugar twice daily E11.649 (using accuchek), Disp: 100 each, Rfl: 12   Lancet Device MISC, UAD to check sugar twice daily E11.649 (using Accucheck machine), Disp: 100 each, Rfl: 12   LORazepam (ATIVAN) 0.5 MG tablet, Take 0.5-1 tablets (0.25-0.5 mg total) by mouth at bedtime as needed for anxiety or sleep., Disp: 15 tablet, Rfl: 0   meclizine (ANTIVERT) 25 MG tablet, Take 1 tablet (25 mg total) by mouth 3 (three) times daily as needed for dizziness., Disp: 30 tablet, Rfl: 0   montelukast (SINGULAIR) 10 MG tablet, TAKE ONE TABLET AT BEDTIME, Disp: 90 tablet, Rfl: 3   omeprazole (PRILOSEC) 40 MG capsule, TAKE (1) CAPSULE DAILY. NEED TO BE SEEN FOR APPOINTMENT, Disp: 30 capsule, Rfl: 5   ondansetron (ZOFRAN) 4 MG tablet, TAKE ONE TABLET EVERY 8 HOURS AS NEEDED FOR NAUSEA AND VOMITING, Disp: 20 tablet, Rfl: 0   ondansetron (ZOFRAN-ODT) 4 MG disintegrating tablet, Take 1 tablet (4 mg total) by mouth every 8 (eight) hours as needed for nausea or vomiting., Disp: 20 tablet, Rfl: 0   tirzepatide (MOUNJARO) 12.5 MG/0.5ML Pen, Inject 12.5 mg into the skin once a week., Disp: 6 mL, Rfl: 0  tirzepatide [facility]) 15 MG/0.5ML Pen, Inject 15 mg into the skin once a week., Disp: 6 mL, Rfl: 3   traZODone (DESYREL) 50 MG tablet, Take 0.5-2 tablets (25-100 mg total) by mouth at bedtime as needed for sleep., Disp: 60 tablet, Rfl: 3   linaclotide (LINZESS) 72 MCG capsule, Take 1 capsule (72 mcg total) by mouth daily before breakfast for 4 days., Disp: 4 capsule, Rfl: 0 Social History   Socioeconomic History   Marital status: Divorced    Spouse name: Not on file   Number of children: 0   Years of education: Not on file   Highest education level: Not on file  Occupational History   Occupation: [employer],  Warehouse manager for resource side  Tobacco Use   Smoking status: Never   Smokeless tobacco: Never  Vaping Use   Vaping Use: Never used  Substance and Sexual Activity   Alcohol use: No   Drug use: No   Sexual activity: Not on file  Other Topics Concern   Not on file  Social History Narrative   Not on file   Social Determinants of Health   Financial Resource Strain: Not on file  Food Insecurity: Not on file  Transportation Needs: Not on file  Physical Activity: Not on file  Stress: Not on file  Social Connections: Not on file  Intimate Partner Violence: Not on file   Family History  Problem Relation Age of Onset   Heart failure Mother    Hypertension Mother    Heart disease Mother    Cancer Other        mother's side of family, breast cancer   Cancer Other        father's side of family, leukemia   Hypertension Father    Stroke Father 22   Diabetes Other    Hypertension Other    Colon cancer Cousin    Healthy Brother    Breast cancer Maternal Aunt 30   Leukemia Paternal Aunt    Kidney disease Maternal Grandmother    Hypertension Maternal Grandmother    Cancer Maternal Grandfather    Leukemia Paternal Grandmother    Cancer Paternal Grandfather     Objective: Office vital signs reviewed. BP (!) 160/85   Pulse 76   Temp 98.1 F (36.7 C)   Ht '5\' 10"'$  (1.778 m)   Wt (!) 375 lb 3.2 oz (170.2 kg)   SpO2 95%   BMI 53.84 kg/m   Physical Examination:  General: Awake, alert, morbidly obese, No acute distress HEENT: Sclera white.  Moist mucous membranes Cardio: regular rate and rhythm, S1S2 heard, no murmurs appreciated Pulm: clear to auscultation bilaterally, no wheezes, rhonchi or rales; normal work of breathing on room air Skin : Right axilla with induration and scarring noted.  She has no active punctum nor any palpable fluctuance of the lesion of concern.  It is warm and tender to touch Neuro: see DM foot  Diabetic Foot Exam - Simple   Simple Foot  Form Diabetic Foot exam was performed with the following findings: Yes 11/28/2021  5:10 PM  Visual Inspection No deformities, no ulcerations, no other skin breakdown bilaterally: Yes Sensation Testing Intact to touch and monofilament testing bilaterally: Yes Pulse Check Posterior Tibialis and Dorsalis pulse intact bilaterally: Yes Comments     Assessment/ Plan: 41 y.o. female   Hidradenitis suppurativa - Plan: sulfamethoxazole-trimethoprim (BACTRIM DS) 800-160 MG tablet, clindamycin (CLEOCIN T) 1 % SWAB  Abscess of axilla, right - Plan: sulfamethoxazole-trimethoprim (BACTRIM DS) 800-160  MG tablet  Hypertension associated with diabetes ([place])  Need for immunization against influenza - Plan: Flu Vaccine QUAD 60moIM (Fluarix, Fluzone & Alfiuria Quad PF)  Bactrim renewed.  Discussed that if symptoms or not improving she will need referral for I&D.  Clindamycin T swabs also renewed  Blood pressure is not controlled but patient has not taken her blood pressure medication today.  Advised her to take the Norvasc when she gets home and monitor blood pressures at home.  Goal less than 140/90.  Diabetic foot exam performed today  Influenza vaccination administered  No orders of the defined types were placed in this encounter.  No orders of the defined types were placed in this encounter.    AJanora Norlander DO [phone]

## 2021-12-01 ENCOUNTER — Other Ambulatory Visit: Payer: Self-pay | Admitting: Family Medicine

## 2021-12-01 DIAGNOSIS — R110 Nausea: Secondary | ICD-10-CM

## 2021-12-03 ENCOUNTER — Other Ambulatory Visit: Payer: Self-pay | Admitting: Family Medicine

## 2022-01-24 ENCOUNTER — Other Ambulatory Visit: Payer: Self-pay | Admitting: Family Medicine

## 2022-01-28 ENCOUNTER — Telehealth: Payer: Self-pay | Admitting: Family Medicine

## 2022-01-28 NOTE — Telephone Encounter (Signed)
Ok to schedule.  May be a bit before I can see him though which could be risky.  If he has a PCP, should see ASAP if BP in emergent range.

## 2022-01-28 NOTE — Telephone Encounter (Signed)
Pt wants to know if Dr Lajuana Ripple will see her nephew as a patient. He would be new to the practice. Says his BP is out of control and wants nephew to see someone that she trusts.   Aware that Dr Lajuana Ripple is not currently taking on anymore patients but wants to know if she would make an exception.

## 2022-01-29 NOTE — Telephone Encounter (Signed)
[email or web address]

## 2022-01-29 NOTE — Telephone Encounter (Signed)
This has been handled and scheduled

## 2022-01-29 NOTE — Telephone Encounter (Signed)
I know I already am maxed out for [place] on 12/20 but ok to put him in that 10am as a new pt.

## 2022-01-31 ENCOUNTER — Encounter: Payer: Self-pay | Admitting: Family Medicine

## 2022-02-12 ENCOUNTER — Ambulatory Visit: Payer: Managed Care, Other (non HMO) | Admitting: Family Medicine

## 2022-02-12 ENCOUNTER — Encounter: Payer: Self-pay | Admitting: Family Medicine

## 2022-02-12 VITALS — BP 148/79 | HR 68 | Temp 97.8°F | Ht 70.0 in | Wt 358.6 lb

## 2022-02-12 DIAGNOSIS — M25562 Pain in left knee: Secondary | ICD-10-CM

## 2022-02-12 DIAGNOSIS — G8929 Other chronic pain: Secondary | ICD-10-CM

## 2022-02-12 DIAGNOSIS — K5903 Drug induced constipation: Secondary | ICD-10-CM

## 2022-02-12 DIAGNOSIS — R1032 Left lower quadrant pain: Secondary | ICD-10-CM | POA: Diagnosis not present

## 2022-02-12 DIAGNOSIS — M25561 Pain in right knee: Secondary | ICD-10-CM

## 2022-02-12 MED ORDER — LINACLOTIDE 290 MCG PO CAPS: 290.0000 ug | ORAL_CAPSULE | Freq: Every day | ORAL | 0 refills | Status: DC

## 2022-02-12 MED ORDER — METHYLPREDNISOLONE ACETATE 40 MG/ML IJ SUSP
40.0000 mg | Freq: Once | INTRAMUSCULAR | Status: AC
  Administered 2022-02-12: 40 mg via INTRAMUSCULAR

## 2022-02-12 NOTE — Progress Notes (Signed)
Subjective: CC: Knee injections PCP: [name], DO [id number] Marilyn Rivera is a 41 y.o. female presenting to clinic today for:  1.  Chronic knee pain Patient with ongoing bilateral chronic knee pain.  Right has been worse than left lately and she in fact notes that she almost fell in the parking lot due to instability of that right knee.  Since beginning her weight loss journey she is lost roughly 75 pounds but knees continue to be bothersome.  This pain is refractory to oral NSAIDs but does respond well to corticosteroid injection she would like to proceed with that today.  2.  Left lower abdominal pain Patient reports intermittent left-sided lower abdominal pain and sometimes epigastric pain.  This been going on for the last couple of weeks.  When this occurred previously she had diverticulitis that ended up requiring surgical intervention by Dr. Constance Haw in [place].  Dr. Oneida Alar was her gastroenterologist but she has since left that practice and relocated to [place] and patient has not established with a new specialist.  She would be interested in being seen in [place] if needing to see GI.  She does report intermittent constipation but no blood in stool.  She is not currently on anything for bowel regulation.  Currently injecting Mounjaro 15 weekly for diabetes   ROS: Per HPI  Allergies  Allergen Reactions   Keflex [Cephalexin]     Yeast infections    Coconut Fatty Acids Itching and Rash   Past Medical History:  Diagnosis Date   Arthritis    Diverticulitis large intestine w/o perforation or abscess w/o bleeding 10/05/2014   Diverticulosis    GERD (gastroesophageal reflux disease)    history of H Pylori   Helicobacter pylori gastritis 09/06/2014   History of kidney stones    Iron deficiency anemia    Kidney stones     Current Outpatient Medications:    acetaminophen (TYLENOL) 500 MG tablet, Take 1 tablet (500 mg total) by mouth every 6 (six) hours as needed.,  Disp: 30 tablet, Rfl: 0   amLODipine (NORVASC) 5 MG tablet, Take 1 tablet (5 mg total) by mouth daily., Disp: 90 tablet, Rfl: 3   clindamycin (CLEOCIN T) 1 % SWAB, Use twice daily to affected areas. For boils, Disp: 180 each, Rfl: 3   diclofenac (VOLTAREN) 75 MG EC tablet, TAKE 1 TABLET 2 TIMES DAILY AS NEEDED FOR MODERATE PAIN, Disp: 180 tablet, Rfl: 3   EPINEPHrine 0.3 mg/0.3 mL IJ SOAJ injection, Inject 0.3 mg into the muscle as needed for anaphylaxis (severe allergic reaction, then call 911)., Disp: 1 each, Rfl: 0   fluticasone (FLONASE) 50 MCG/ACT nasal spray, Place 2 sprays into both nostrils daily., Disp: 16 g, Rfl: 6   glucose blood test strip, UAD to check sugar twice daily E11.649 (using accuchek), Disp: 100 each, Rfl: 12   Lancet Device MISC, UAD to check sugar twice daily E11.649 (using Accucheck machine), Disp: 100 each, Rfl: 12   linaclotide (LINZESS) 72 MCG capsule, Take 1 capsule (72 mcg total) by mouth daily before breakfast for 4 days., Disp: 4 capsule, Rfl: 0   LORazepam (ATIVAN) 0.5 MG tablet, Take 0.5-1 tablets (0.25-0.5 mg total) by mouth at bedtime as needed for anxiety or sleep., Disp: 15 tablet, Rfl: 0   meclizine (ANTIVERT) 25 MG tablet, Take 1 tablet (25 mg total) by mouth 3 (three) times daily as needed for dizziness., Disp: 30 tablet, Rfl: 0   montelukast (SINGULAIR) 10 MG tablet, TAKE ONE TABLET  AT BEDTIME, Disp: 90 tablet, Rfl: 3   omeprazole (PRILOSEC) 40 MG capsule, TAKE (1) CAPSULE DAILY. NEED TO BE SEEN FOR APPOINTMENT, Disp: 30 capsule, Rfl: 5   ondansetron (ZOFRAN) 4 MG tablet, TAKE ONE TABLET EVERY 8 HOURS AS NEEDED FOR NAUSEA AND VOMITING, Disp: 20 tablet, Rfl: 2   sulfamethoxazole-trimethoprim (BACTRIM DS) 800-160 MG tablet, Take 1 tablet by mouth 2 (two) times daily. X7 days per HS flareup., Disp: 30 tablet, Rfl: 2   tirzepatide (MOUNJARO) 12.5 MG/0.5ML Pen, Inject 12.5 mg into the skin once a week., Disp: 6 mL, Rfl: 0   tirzepatide (MOUNJARO) 15 MG/0.5ML Pen,  Inject 15 mg into the skin once a week., Disp: 6 mL, Rfl: 3   traZODone (DESYREL) 50 MG tablet, Take 0.5-2 tablets (25-100 mg total) by mouth at bedtime as needed for sleep., Disp: 60 tablet, Rfl: 3 Social History   Socioeconomic History   Marital status: Divorced    Spouse name: Not on file   Number of children: 0   Years of education: Not on file   Highest education level: Not on file  Occupational History   Occupation: Engineer, petroleum, Warehouse manager for resource side  Tobacco Use   Smoking status: Never   Smokeless tobacco: Never  Vaping Use   Vaping Use: Never used  Substance and Sexual Activity   Alcohol use: No   Drug use: No   Sexual activity: Not on file  Other Topics Concern   Not on file  Social History Narrative   Not on file   Social Determinants of Health   Financial Resource Strain: Not on file  Food Insecurity: Not on file  Transportation Needs: Not on file  Physical Activity: Not on file  Stress: Not on file  Social Connections: Not on file  Intimate Partner Violence: Not on file   Family History  Problem Relation Age of Onset   Heart failure Mother    Hypertension Mother    Heart disease Mother    Cancer Other        mother's side of family, breast cancer   Cancer Other        father's side of family, leukemia   Hypertension Father    Stroke Father 49   Diabetes Other    Hypertension Other    Colon cancer Cousin    Healthy Brother    Breast cancer Maternal Aunt 25   Leukemia Paternal Aunt    Kidney disease Maternal Grandmother    Hypertension Maternal Grandmother    Cancer Maternal Grandfather    Leukemia Paternal Grandmother    Cancer Paternal Grandfather     Objective: Office vital signs reviewed. BP (!) 148/79   Pulse 68   Temp 97.8 F (36.6 C)   Ht '5\' 10"'$  (1.778 m)   Wt (!) 358 lb 9.6 oz (162.7 kg)   SpO2 100%   BMI 51.45 kg/m   Physical Examination:  General: Awake, alert, obese, No acute distress HEENT: sclera white, MMM Cardio:  regular rate and rhythm  Pulm: normal work of breathing on room air GI: Obese, soft.  She does have mild tenderness to palpation in the left lower quadrant.  No rebound or guarding MSK: Antalgic gait but ambulating independently  JOINT INJECTION:  Patient denies allergy to antiseptics (including iodine) and anesthetics.  Patient has a h/o CONTROLLED diabetes. No use of blood thinners/ antiplatelets.  Last steroid shot August 2023.  Patient was given informed consent and a signed copy has been placed in  the chart. Appropriate time out was taken. Area prepped and draped in usual sterile fashion. Anatomic landmarks were identified and injection site was marked.  Ethyl chloride spray was used to numb the area and 1 cc of methylprednisolone 40 mg/ml plus  3 cc of 1% lidocaine without epinephrine was injected into the right knee using a(n) anteriolateral approach. The patient tolerated the procedure well and there were no immediate complications. Estimated blood loss is less than 1 cc.  Post procedure instructions were reviewed and handout outlining these instructions were provided to patient.  Patient was given informed consent and a signed copy has been placed in the chart. Appropriate time out was taken. Area prepped and draped in usual sterile fashion. Anatomic landmarks were identified and injection site was marked.  Ethyl chloride spray was used to numb the area and 1 cc of methylprednisolone 40 mg/ml plus  3 cc of 1% lidocaine without epinephrine was injected into the left knee using a(n)  anteriolateral approach. The patient tolerated the procedure well and there were no immediate complications. Estimated blood loss is less than 1 cc.  Post procedure instructions were reviewed and handout outlining these instructions were provided to patient.    Assessment/ Plan: 41 y.o. female   Bilateral chronic knee pain - Plan: methylPREDNISolone acetate (DEPO-MEDROL) injection 40 mg, methylPREDNISolone  acetate (DEPO-MEDROL) injection 40 mg  Drug-induced constipation - Plan: linaclotide (LINZESS) 290 MCG CAPS capsule  LLQ abdominal pain - Plan: linaclotide (LINZESS) 290 MCG CAPS capsule  Corticosteroid injection administered bilaterally.  Patient tolerated procedure without difficulty.  I am going to trial her on Linzess for what sounds like drug-induced constipation.  Uncertain if the left lower quadrant pain is secondary to chronic constipation from medication versus a another flareup of her diverticulitis.  We discussed that if no significant improvement in bowel movements in the next week, low threshold to obtain CT abdomen pelvis to further evaluate possible diverticulitis and may need referral back to new GI and Dr. Constance Haw if this is present.  She was amenable to plan.  She understands red flag signs and symptoms warranting further evaluation will follow-up as needed  No orders of the defined types were placed in this encounter.  No orders of the defined types were placed in this encounter.    [name], DO [place] [phone]

## 2022-04-02 ENCOUNTER — Other Ambulatory Visit: Payer: Self-pay | Admitting: Family Medicine

## 2022-04-02 DIAGNOSIS — I10 Essential (primary) hypertension: Secondary | ICD-10-CM

## 2022-04-08 ENCOUNTER — Encounter (INDEPENDENT_AMBULATORY_CARE_PROVIDER_SITE_OTHER): Payer: Managed Care, Other (non HMO) | Admitting: Family Medicine

## 2022-04-08 DIAGNOSIS — N760 Acute vaginitis: Secondary | ICD-10-CM

## 2022-04-08 DIAGNOSIS — B9689 Other specified bacterial agents as the cause of diseases classified elsewhere: Secondary | ICD-10-CM

## 2022-04-08 MED ORDER — METRONIDAZOLE 500 MG PO TABS: 500.0000 mg | ORAL_TABLET | Freq: Two times a day (BID) | ORAL | 0 refills | Status: DC

## 2022-04-08 NOTE — Telephone Encounter (Signed)

## 2022-04-30 ENCOUNTER — Other Ambulatory Visit: Payer: Self-pay | Admitting: Family Medicine

## 2022-04-30 ENCOUNTER — Encounter: Payer: Self-pay | Admitting: Family Medicine

## 2022-04-30 DIAGNOSIS — R110 Nausea: Secondary | ICD-10-CM

## 2022-05-06 ENCOUNTER — Other Ambulatory Visit: Payer: Self-pay | Admitting: Family Medicine

## 2022-05-06 DIAGNOSIS — E1169 Type 2 diabetes mellitus with other specified complication: Secondary | ICD-10-CM

## 2022-05-28 ENCOUNTER — Encounter: Payer: Self-pay | Admitting: Family Medicine

## 2022-05-28 NOTE — Telephone Encounter (Signed)
Marilyn Rivera needs an appt with only Dr Lajuana Ripple and has to be on her lunch hour which is around 12:00.   I added her to Dr Alver Sorrow wait list in case something opened up.

## 2022-05-29 ENCOUNTER — Ambulatory Visit (INDEPENDENT_AMBULATORY_CARE_PROVIDER_SITE_OTHER): Payer: Managed Care, Other (non HMO) | Admitting: Family Medicine

## 2022-05-29 ENCOUNTER — Encounter: Payer: Self-pay | Admitting: Family Medicine

## 2022-05-29 VITALS — BP 126/73 | HR 65 | Temp 98.3°F | Ht 70.0 in | Wt 366.0 lb

## 2022-05-29 DIAGNOSIS — M25562 Pain in left knee: Secondary | ICD-10-CM | POA: Diagnosis not present

## 2022-05-29 DIAGNOSIS — E1169 Type 2 diabetes mellitus with other specified complication: Secondary | ICD-10-CM | POA: Diagnosis not present

## 2022-05-29 DIAGNOSIS — G8929 Other chronic pain: Secondary | ICD-10-CM | POA: Diagnosis not present

## 2022-05-29 DIAGNOSIS — M25561 Pain in right knee: Secondary | ICD-10-CM | POA: Diagnosis not present

## 2022-05-29 DIAGNOSIS — E1159 Type 2 diabetes mellitus with other circulatory complications: Secondary | ICD-10-CM

## 2022-05-29 DIAGNOSIS — I152 Hypertension secondary to endocrine disorders: Secondary | ICD-10-CM

## 2022-05-29 LAB — BAYER DCA HB A1C WAIVED: HB A1C (BAYER DCA - WAIVED): 5.3 % (ref 4.8–5.6)

## 2022-05-29 MED ORDER — MOUNJARO 15 MG/0.5ML ~~LOC~~ SOAJ: 15.0000 mg | SUBCUTANEOUS | 3 refills | Status: DC

## 2022-05-29 MED ORDER — AMLODIPINE BESYLATE 5 MG PO TABS: 5.0000 mg | ORAL_TABLET | Freq: Every day | ORAL | 3 refills | Status: DC

## 2022-05-29 NOTE — Progress Notes (Signed)
Subjective: [id number] knee pain PCP: [name], DO [id number] Marilyn Rivera is a 42 y.o. female presenting to clinic today for:  1. Chronic knee pain Patient her for bilateral chronic knee pain.  She reports that the right hurts worse than left today.  Last corticosteroid injection 01/2022.  No falls but she feels like the tracking of her right knee when she extends it is off.  She did have an injury to this area but has not followed up with ortho.  2. Type 2 Diabetes with hypertension, hyperlipidemia:  She is compliant with meds.  Continues to have gradual weight loss with mounjaro. Continues to get intermittent nausea relieved by zofran.  Last eye exam: needs Last foot exam: UTD Last A1c:  Lab Results  Component Value Date   HGBA1C 5.5 10/23/2021   Nephropathy screen indicated?: needs Last flu, zoster and/or pneumovax:  Immunization History  Administered Date(s) Administered   Influenza, Seasonal, Injecte, Preservative Fre 12/23/2019, 12/07/2020   Influenza,inj,Quad PF,6+ Mos 01/18/2019, 11/28/2021   Influenza-Unspecified 12/23/2019, 12/07/2020, 11/28/2021   MMR 04/07/1997   Moderna Covid-19 Vaccine Bivalent Booster 58yrs & up 01/09/2021   Moderna Sars-Covid-2 Vaccination 01/18/2020   PFIZER(Purple Top)SARS-COV-2 Vaccination 05/22/2019, 06/15/2019   Tdap 05/24/2020   ROS: Per HPI  Allergies  Allergen Reactions   Keflex [Cephalexin]     Yeast infections    Coconut Fatty Acids Itching and Rash   Past Medical History:  Diagnosis Date   Arthritis    Diverticulitis large intestine w/o perforation or abscess w/o bleeding 10/05/2014   Diverticulosis    GERD (gastroesophageal reflux disease)    history of H Pylori   Helicobacter pylori gastritis 09/06/2014   History of kidney stones    Iron deficiency anemia    Kidney stones     Current Outpatient Medications:    acetaminophen (TYLENOL) 500 MG tablet, Take 1 tablet (500 mg total) by mouth every 6 (six)  hours as needed., Disp: 30 tablet, Rfl: 0   amLODipine (NORVASC) 5 MG tablet, TAKE ONE TABLET BY MOUTH EVERY DAY, Disp: 90 tablet, Rfl: 0   clindamycin (CLEOCIN T) 1 % SWAB, Use twice daily to affected areas. For boils, Disp: 180 each, Rfl: 3   diclofenac (VOLTAREN) 75 MG EC tablet, TAKE 1 TABLET 2 TIMES DAILY AS NEEDED FOR MODERATE PAIN, Disp: 180 tablet, Rfl: 3   EPINEPHrine 0.3 mg/0.3 mL IJ SOAJ injection, Inject 0.3 mg into the muscle as needed for anaphylaxis (severe allergic reaction, then call 911)., Disp: 1 each, Rfl: 0   fluticasone (FLONASE) 50 MCG/ACT nasal spray, Place 2 sprays into both nostrils daily., Disp: 16 g, Rfl: 6   glucose blood test strip, UAD to check sugar twice daily E11.649 (using accuchek), Disp: 100 each, Rfl: 12   Lancet Device MISC, UAD to check sugar twice daily E11.649 (using Accucheck machine), Disp: 100 each, Rfl: 12   linaclotide (LINZESS) 290 MCG CAPS capsule, Take 1 capsule (290 mcg total) by mouth daily before breakfast., Disp: 8 capsule, Rfl: 0   LORazepam (ATIVAN) 0.5 MG tablet, Take 0.5-1 tablets (0.25-0.5 mg total) by mouth at bedtime as needed for anxiety or sleep., Disp: 15 tablet, Rfl: 0   meclizine (ANTIVERT) 25 MG tablet, Take 1 tablet (25 mg total) by mouth 3 (three) times daily as needed for dizziness., Disp: 30 tablet, Rfl: 0   metroNIDAZOLE (FLAGYL) 500 MG tablet, Take 1 tablet (500 mg total) by mouth 2 (two) times daily., Disp: 14 tablet, Rfl: 0  montelukast (SINGULAIR) 10 MG tablet, TAKE ONE TABLET AT BEDTIME, Disp: 90 tablet, Rfl: 3   omeprazole (PRILOSEC) 40 MG capsule, TAKE (1) CAPSULE DAILY. NEED TO BE SEEN FOR APPOINTMENT, Disp: 30 capsule, Rfl: 5   ondansetron (ZOFRAN) 4 MG tablet, TAKE ONE TABLET EVERY 8 HOURS AS NEEDED FOR NAUSEA AND VOMITING, Disp: 20 tablet, Rfl: 1   sulfamethoxazole-trimethoprim (BACTRIM DS) 800-160 MG tablet, Take 1 tablet by mouth 2 (two) times daily. X7 days per HS flareup., Disp: 30 tablet, Rfl: 2   tirzepatide  (MOUNJARO) 12.5 MG/0.5ML Pen, Inject 12.5 mg into the skin once a week., Disp: 6 mL, Rfl: 0   tirzepatide (MOUNJARO) 15 MG/0.5ML Pen, Inject 15 mg into the skin once a week. (NEEDS TO BE SEEN BEFORE NEXT REFILL), Disp: 2 mL, Rfl: 0   traZODone (DESYREL) 50 MG tablet, Take 0.5-2 tablets (25-100 mg total) by mouth at bedtime as needed for sleep., Disp: 60 tablet, Rfl: 3 Social History   Socioeconomic History   Marital status: Divorced    Spouse name: Not on file   Number of children: 0   Years of education: Not on file   Highest education level: Not on file  Occupational History   Occupation: Engineer, petroleum, Warehouse manager for resource side  Tobacco Use   Smoking status: Never   Smokeless tobacco: Never  Vaping Use   Vaping Use: Never used  Substance and Sexual Activity   Alcohol use: No   Drug use: No   Sexual activity: Not on file  Other Topics Concern   Not on file  Social History Narrative   Not on file   Social Determinants of Health   Financial Resource Strain: Not on file  Food Insecurity: Not on file  Transportation Needs: Not on file  Physical Activity: Not on file  Stress: Not on file  Social Connections: Not on file  Intimate Partner Violence: Not on file   Family History  Problem Relation Age of Onset   Heart failure Mother    Hypertension Mother    Heart disease Mother    Cancer Other        mother's side of family, breast cancer   Cancer Other        father's side of family, leukemia   Hypertension Father    Stroke Father 51   Diabetes Other    Hypertension Other    Colon cancer Cousin    Healthy Brother    Breast cancer Maternal Aunt 24   Leukemia Paternal Aunt    Kidney disease Maternal Grandmother    Hypertension Maternal Grandmother    Cancer Maternal Grandfather    Leukemia Paternal Grandmother    Cancer Paternal Grandfather     Objective: Office vital signs reviewed. BP 126/73   Pulse 65   Temp 98.3 F (36.8 C)   Ht 5\' 10"  (1.778 m)   Wt  (!) 366 lb (166 kg)   SpO2 95%   BMI 52.52 kg/m   Physical Examination:  General: Awake, alert, morbidly obese, No acute distress HEENT: sclera white, MMM Cardio: regular rate and rhythm  Extremities: warm, well perfused, No edema, cyanosis or clubbing; +2 pulses bilaterally MSK: antalgic and stiff gait and normal station Skin: Axillary scarring noted from previous abscesses  JOINT INJECTION:  Patient denies allergy to antiseptics (including iodine) and anesthetics.  Patient has h/o CONTROLLED diabetes and frequent steroid use (q3-47m knee injections). No use of blood thinners/ antiplatelets.  Patient was given informed consent and a signed  copy has been placed in the chart. Appropriate time out was taken. Area prepped and draped in usual sterile fashion. Anatomic landmarks were identified and injection site was marked.  Ethyl chloride spray was used to numb the area and 1 cc of methylprednisolone 40 mg/ml plus  3 cc of 1% lidocaine without epinephrine was injected into the right knee using a(n) anteriolateral approach. The patient tolerated the procedure well and there were no immediate complications. Estimated blood loss is less than 1 cc.  Post procedure instructions were reviewed and handout outlining these instructions were provided to patient.  JOINT INJECTION:  Patient was given informed consent and a signed copy has been placed in the chart. Appropriate time out was taken. Area prepped and draped in usual sterile fashion. Anatomic landmarks were identified and injection site was marked.  Ethyl chloride spray was used to numb the area and 1 cc of methylprednisolone 40 mg/ml plus  3 cc of 1% lidocaine without epinephrine was injected into the left knee using a(n) anteriolateral approach. The patient tolerated the procedure well and there were no immediate complications. Estimated blood loss is less than 1 cc.  Post procedure instructions were reviewed and handout outlining these  instructions were provided to patient.   Assessment/ Plan: 42 y.o. female   Bilateral chronic knee pain  Type 2 diabetes mellitus with other specified complication, without long-term current use of insulin - Plan: Bayer DCA Hb A1c Waived, tirzepatide (MOUNJARO) 15 MG/0.5ML Pen  Hypertension associated with diabetes - Plan: amLODipine (NORVASC) 5 MG tablet  Corticosteroid injection administered bilaterally.  Patient tolerated procedure without difficulty.  No immediate complications.  Home care instructions reviewed and follow-up as needed  Sugar remains well-controlled.  Continue current regimen with Mounjaro.  Due for physical exam with fasting labs, urine microalbumin and diabetic eye exam.  I highly encouraged that she schedule her routine care ASAP  Blood pressure well-controlled.  Continue current regimen.  Norvasc renewed  Orders Placed This Encounter  Procedures   Bayer DCA Hb A1c Waived   No orders of the defined types were placed in this encounter.    [name], DO [place] [phone]

## 2022-06-08 ENCOUNTER — Other Ambulatory Visit: Payer: Self-pay | Admitting: Family Medicine

## 2022-06-09 ENCOUNTER — Encounter: Payer: Self-pay | Admitting: Family Medicine

## 2022-06-11 ENCOUNTER — Ambulatory Visit: Payer: Managed Care, Other (non HMO)

## 2022-06-19 ENCOUNTER — Encounter: Payer: Self-pay | Admitting: Family Medicine

## 2022-06-19 DIAGNOSIS — L732 Hidradenitis suppurativa: Secondary | ICD-10-CM

## 2022-06-19 DIAGNOSIS — L02411 Cutaneous abscess of right axilla: Secondary | ICD-10-CM

## 2022-06-20 MED ORDER — SULFAMETHOXAZOLE-TRIMETHOPRIM 800-160 MG PO TABS: 1.0000 | ORAL_TABLET | Freq: Two times a day (BID) | ORAL | 2 refills | Status: DC

## 2022-06-25 ENCOUNTER — Encounter (INDEPENDENT_AMBULATORY_CARE_PROVIDER_SITE_OTHER): Payer: Managed Care, Other (non HMO) | Admitting: Family Medicine

## 2022-06-25 DIAGNOSIS — R110 Nausea: Secondary | ICD-10-CM

## 2022-06-25 DIAGNOSIS — R1032 Left lower quadrant pain: Secondary | ICD-10-CM

## 2022-06-25 DIAGNOSIS — R197 Diarrhea, unspecified: Secondary | ICD-10-CM

## 2022-06-25 DIAGNOSIS — R1030 Lower abdominal pain, unspecified: Secondary | ICD-10-CM

## 2022-06-25 DIAGNOSIS — R112 Nausea with vomiting, unspecified: Secondary | ICD-10-CM

## 2022-06-26 ENCOUNTER — Ambulatory Visit (HOSPITAL_COMMUNITY)
Admission: RE | Admit: 2022-06-26 | Discharge: 2022-06-26 | Disposition: A | Discharge status: Home / Self Care | Payer: Managed Care, Other (non HMO) | Source: Ambulatory Visit | Attending: Family Medicine | Admitting: Family Medicine

## 2022-06-26 DIAGNOSIS — R1032 Left lower quadrant pain: Secondary | ICD-10-CM | POA: Diagnosis present

## 2022-06-26 DIAGNOSIS — R1030 Lower abdominal pain, unspecified: Secondary | ICD-10-CM

## 2022-06-26 DIAGNOSIS — R112 Nausea with vomiting, unspecified: Secondary | ICD-10-CM

## 2022-06-26 DIAGNOSIS — R197 Diarrhea, unspecified: Secondary | ICD-10-CM

## 2022-06-26 MED ORDER — ONDANSETRON HCL 4 MG PO TABS: ORAL_TABLET | ORAL | 1 refills | Status: DC

## 2022-06-26 MED ORDER — IOHEXOL 300 MG/ML  SOLN
100.0000 mL | Freq: Once | INTRAMUSCULAR | Status: AC | PRN
  Administered 2022-06-26: 100 mL via INTRAVENOUS

## 2022-06-26 NOTE — Addendum Note (Signed)
Addended by: [name] on: 06/26/2022 08:09 PM   Modules accepted: Orders

## 2022-06-26 NOTE — Telephone Encounter (Signed)
Please see the MyChart message reply(ies) for my assessment and plan.    This patient gave consent for this Medical Advice Message and is aware that it may result in a bill to [employer], as well as the possibility of receiving a bill for a co-payment or deductible. They are an established patient, but are not seeking medical advice exclusively about a problem treated during an in person or video visit in the last seven days. I did not recommend an in person or video visit within seven days of my reply.    I spent a total of 14 minutes cumulative time within 7 days through [employer].  Delynn Flavin, DO

## 2022-07-01 ENCOUNTER — Other Ambulatory Visit: Payer: Self-pay | Admitting: Family Medicine

## 2022-07-01 ENCOUNTER — Telehealth: Payer: Self-pay | Admitting: Pharmacy Technician

## 2022-07-01 DIAGNOSIS — E1169 Type 2 diabetes mellitus with other specified complication: Secondary | ICD-10-CM

## 2022-07-01 NOTE — Telephone Encounter (Signed)
Patient Advocate Encounter   Received notification that prior authorization for Mounjaro 15MG /0.5ML pen-injectors is required.   PA submitted on 07/01/2022 [id number] ePA Form Status is pending      1

## 2022-07-02 ENCOUNTER — Other Ambulatory Visit (HOSPITAL_COMMUNITY): Payer: Self-pay

## 2022-07-02 NOTE — Telephone Encounter (Signed)
PA has been APPROVED from 07/02/2022-07/01/2023  Per test claim refill too soon until 05/11

## 2022-07-08 ENCOUNTER — Encounter: Payer: Self-pay | Admitting: Family Medicine

## 2022-07-16 ENCOUNTER — Other Ambulatory Visit: Payer: Self-pay | Admitting: Family Medicine

## 2022-07-16 DIAGNOSIS — G8929 Other chronic pain: Secondary | ICD-10-CM

## 2022-07-23 ENCOUNTER — Encounter: Payer: Self-pay | Admitting: Family Medicine

## 2022-07-23 NOTE — Telephone Encounter (Signed)
This is FMLA and per office policy, all paperwork is supposed to go to her first.

## 2022-07-25 ENCOUNTER — Telehealth: Payer: Self-pay | Admitting: Family Medicine

## 2022-07-25 DIAGNOSIS — Z0279 Encounter for issue of other medical certificate: Secondary | ICD-10-CM

## 2022-07-25 NOTE — Telephone Encounter (Signed)
Patient sent FMLA forms through MyChart forms to be completed and signed. Collected information needed to complete form and patient will call back to pay fee.  Form Fee Paid? (Y/N)    N     If NO, form is placed on front office manager desk to hold until payment received. If YES, then form will be placed in the RX/HH Nurse Coordinators box for completion.  Form will not be processed until payment is received

## 2022-08-01 ENCOUNTER — Other Ambulatory Visit: Payer: Self-pay | Admitting: Family Medicine

## 2022-08-01 ENCOUNTER — Telehealth: Payer: Self-pay | Admitting: Family Medicine

## 2022-08-01 NOTE — Telephone Encounter (Signed)
Patient came in to the office to pay the form fee. Forms placed in RX/HH office

## 2022-08-01 NOTE — Telephone Encounter (Signed)
Information completed and forwarded to provider 

## 2022-08-01 NOTE — Telephone Encounter (Signed)
Samples of Linzess provided. Patient aware

## 2022-08-06 NOTE — Telephone Encounter (Signed)
PCP completed and signed FMLA forms. They have been faxed to [facility] at fax number [phone]. Patient has been contacted via MyChart message and informed they are complete.

## 2022-08-13 ENCOUNTER — Encounter: Payer: Self-pay | Admitting: Family Medicine

## 2022-08-16 ENCOUNTER — Other Ambulatory Visit: Payer: Self-pay | Admitting: Family Medicine

## 2022-08-16 ENCOUNTER — Encounter: Payer: Self-pay | Admitting: Family Medicine

## 2022-08-16 DIAGNOSIS — E1169 Type 2 diabetes mellitus with other specified complication: Secondary | ICD-10-CM

## 2022-08-16 MED ORDER — TIRZEPATIDE 12.5 MG/0.5ML ~~LOC~~ SOAJ
12.5000 mg | SUBCUTANEOUS | 0 refills | Status: DC
Start: 2022-08-16 — End: 2022-10-21

## 2022-08-16 NOTE — Telephone Encounter (Signed)
Can you check in on whatever is incomplete?

## 2022-08-27 ENCOUNTER — Other Ambulatory Visit: Payer: Self-pay | Admitting: Family Medicine

## 2022-08-27 DIAGNOSIS — Z1231 Encounter for screening mammogram for malignant neoplasm of breast: Secondary | ICD-10-CM

## 2022-09-03 ENCOUNTER — Other Ambulatory Visit: Payer: Self-pay | Admitting: Family Medicine

## 2022-09-03 DIAGNOSIS — R110 Nausea: Secondary | ICD-10-CM

## 2022-09-10 ENCOUNTER — Encounter: Payer: Self-pay | Admitting: Family Medicine

## 2022-09-10 ENCOUNTER — Ambulatory Visit: Payer: Managed Care, Other (non HMO) | Admitting: Family Medicine

## 2022-09-10 VITALS — BP 132/77 | HR 84 | Temp 98.3°F | Ht 70.0 in | Wt 363.0 lb

## 2022-09-10 DIAGNOSIS — Z0001 Encounter for general adult medical examination with abnormal findings: Secondary | ICD-10-CM | POA: Diagnosis not present

## 2022-09-10 DIAGNOSIS — M25561 Pain in right knee: Secondary | ICD-10-CM

## 2022-09-10 DIAGNOSIS — M25562 Pain in left knee: Secondary | ICD-10-CM

## 2022-09-10 DIAGNOSIS — Z0000 Encounter for general adult medical examination without abnormal findings: Secondary | ICD-10-CM

## 2022-09-10 DIAGNOSIS — I152 Hypertension secondary to endocrine disorders: Secondary | ICD-10-CM

## 2022-09-10 DIAGNOSIS — E1169 Type 2 diabetes mellitus with other specified complication: Secondary | ICD-10-CM | POA: Diagnosis not present

## 2022-09-10 DIAGNOSIS — K5903 Drug induced constipation: Secondary | ICD-10-CM

## 2022-09-10 DIAGNOSIS — L732 Hidradenitis suppurativa: Secondary | ICD-10-CM

## 2022-09-10 DIAGNOSIS — Z124 Encounter for screening for malignant neoplasm of cervix: Secondary | ICD-10-CM

## 2022-09-10 DIAGNOSIS — G8929 Other chronic pain: Secondary | ICD-10-CM

## 2022-09-10 DIAGNOSIS — E1159 Type 2 diabetes mellitus with other circulatory complications: Secondary | ICD-10-CM | POA: Diagnosis not present

## 2022-09-10 DIAGNOSIS — Z8719 Personal history of other diseases of the digestive system: Secondary | ICD-10-CM

## 2022-09-10 LAB — CMP14+EGFR

## 2022-09-10 LAB — CBC
Hemoglobin: 11.7 g/dL (ref 11.1–15.9)
MCV: 83 fL (ref 79–97)

## 2022-09-10 MED ORDER — AMLODIPINE BESYLATE 5 MG PO TABS: 5.0000 mg | ORAL_TABLET | Freq: Every day | ORAL | 3 refills | Status: DC

## 2022-09-10 MED ORDER — LINACLOTIDE 145 MCG PO CAPS
145.0000 ug | ORAL_CAPSULE | Freq: Every day | ORAL | 3 refills | Status: DC
Start: 2022-09-10 — End: 2022-12-10

## 2022-09-10 MED ORDER — SULFAMETHOXAZOLE-TRIMETHOPRIM 800-160 MG PO TABS: 1.0000 | ORAL_TABLET | Freq: Two times a day (BID) | ORAL | 2 refills | Status: DC

## 2022-09-10 MED ORDER — CLINDAMYCIN PHOSPHATE 1 % EX SWAB
CUTANEOUS | 3 refills | Status: DC
Start: 2022-09-10 — End: 2022-11-15

## 2022-09-10 MED ORDER — METHYLPREDNISOLONE ACETATE 40 MG/ML IJ SUSP
40.0000 mg | Freq: Once | INTRAMUSCULAR | Status: AC
Start: 2022-09-10 — End: 2022-09-10
  Administered 2022-09-10: 40 mg via INTRAMUSCULAR

## 2022-09-10 MED ORDER — MONTELUKAST SODIUM 10 MG PO TABS: 10.0000 mg | ORAL_TABLET | Freq: Every day | ORAL | 3 refills | Status: DC

## 2022-09-10 NOTE — Progress Notes (Signed)
Marilyn Rivera is a 42 y.o. female presents to office today for annual physical exam examination.    Concerns today include: 1.  Bilateral knee pain She reports exacerbation and bilateral knee pain as of late.  She is been having more difficulty going up and down stairs.  This is not relieved by oral Voltaren or topical analgesics.  It limits her ability to exercise  2.  History of diverticulitis, IBS constipation Patient reports that Linzess 145 was helpful but to 90 was certainly too much.  It caused her to feel very ill and therefore she has essentially been trying to perform self enemas in efforts to alleviate constipation.  This has been a chronic issue for her.  She has suffered with bouts of what she believes to be is diverticulitis and has self treated with home antibiotics.  She used to be under the care of Dr. Darrick Penna for this and in fact has history of abdominal colon surgery.  She has been lost to follow-up however due to them retiring.  She needs a new specialist that she is required several days off of work due to these flareups of diverticulitis.  She reports no blood in stool.  She continues to take [facility] for diabetes control.   Occupation: works from home, Marital status: divorced, Substance use: none Health Maintenance Due  Topic Date Due   OPHTHALMOLOGY EXAM  Never done   Diabetic kidney evaluation - Urine ACR  Never done   PAP SMEAR-Modifier  07/25/2019   COVID-19 Vaccine (5 - 2023-24 season) 10/26/2021   MAMMOGRAM  05/23/2022   Refills needed today: bactrim Immunizations needed: Immunization History  Administered Date(s) Administered   Influenza, Seasonal, Injecte, Preservative Fre 12/23/2019, 12/07/2020   Influenza,inj,Quad PF,6+ Mos 01/18/2019, 11/28/2021   Influenza-Unspecified 12/23/2019, 12/07/2020, 11/28/2021   MMR 04/07/1997   Moderna Covid-19 Vaccine Bivalent Booster 52yrs & up 01/09/2021   Moderna Sars-Covid-2 Vaccination 01/18/2020    PFIZER(Purple Top)SARS-COV-2 Vaccination 05/22/2019, 06/15/2019   Tdap 05/24/2020     Past Medical History:  Diagnosis Date   Arthritis    Diverticulitis large intestine w/o perforation or abscess w/o bleeding 10/05/2014   Diverticulosis    GERD (gastroesophageal reflux disease)    history of H Pylori   Helicobacter pylori gastritis 09/06/2014   History of kidney stones    Iron deficiency anemia    Kidney stones    Social History   Socioeconomic History   Marital status: Divorced    Spouse name: Not on file   Number of children: 0   Years of education: Not on file   Highest education level: Associate degree: occupational, Scientist, product/process development, or vocational program  Occupational History   Occupation: Estate agent, Nurse, adult for resource side  Tobacco Use   Smoking status: Never   Smokeless tobacco: Never  Vaping Use   Vaping status: Never Used  Substance and Sexual Activity   Alcohol use: No   Drug use: No   Sexual activity: Not on file  Other Topics Concern   Not on file  Social History Narrative   Not on file   Social Determinants of Health   Financial Resource Strain: Low Risk  (09/09/2022)   Overall Financial Resource Strain (CARDIA)    Difficulty of Paying Living Expenses: Not hard at all  Food Insecurity: No Food Insecurity (09/09/2022)   Hunger Vital Sign    Worried About Running Out of Food in the Last Year: Never true    Ran Out of Food in the Last  Year: Never true  Transportation Needs: No Transportation Needs (09/09/2022)   PRAPARE - Administrator, Civil Service (Medical): No    Lack of Transportation (Non-Medical): No  Physical Activity: Insufficiently Active (09/09/2022)   Exercise Vital Sign    Days of Exercise per Week: 2 days    Minutes of Exercise per Session: 30 min  Stress: Stress Concern Present (09/09/2022)   [employer] of Occupational Health - Occupational Stress Questionnaire    Feeling of Stress : To some extent  Social Connections:  Moderately Integrated (09/09/2022)   Social Connection and Isolation Panel [NHANES]    Frequency of Communication with Friends and Family: More than three times a week    Frequency of Social Gatherings with Friends and Family: Three times a week    Attends Religious Services: More than 4 times per year    Active Member of Clubs or Organizations: Yes    Attends Banker Meetings: More than 4 times per year    Marital Status: Divorced  Intimate Partner Violence: Unknown (09/02/2021)   Received from [facility]   HITS    Physically Hurt: Not on file    Insult or Talk Down To: Not on file    Threaten Physical Harm: Not on file    Scream or Curse: Not on file   Past Surgical History:  Procedure Laterality Date   CENTRAL VENOUS CATHETER INSERTION Right 06/02/2017   Procedure: INSERTION CENTRAL LINE RIGHT INTERNAL JUGULAR;  Surgeon: Lucretia Roers, MD;  Location: [facility];  Service: General;  Laterality: Right;   COLONOSCOPY N/A 06/10/2014   Dr. Darrick Penna; redundant sigmoid colon, moderate diverticulosis in the sigmoid and descending colon. small internal hemorrhoids. Next screening at age 64.    ESOPHAGOGASTRODUODENOSCOPY N/A 06/10/2014   Dr. Darrick Penna: H.pylori gastritis s/p treatment with Amoxicillin and Biaxin   KNEE SURGERY Right    PARTIAL COLECTOMY N/A 06/02/2017   Procedure: PARTIAL SIGMOID COLECTOMY (OPEN);  Surgeon: Lucretia Roers, MD;  Location: [facility];  Service: General;  Laterality: N/A;   renal calculi removal Left 2010   SKIN LESION EXCISION     over right eyebrow due to wax being left above eye and it seeped down into pore   Family History  Problem Relation Age of Onset   Heart failure Mother    Hypertension Mother    Heart disease Mother    Cancer Other        mother's side of family, breast cancer   Cancer Other        father's side of family, leukemia   Hypertension Father    Stroke Father 69   Diabetes Other    Hypertension Other    Colon cancer Cousin     Healthy Brother    Breast cancer Maternal Aunt 40   Leukemia Paternal Aunt    Kidney disease Maternal Grandmother    Hypertension Maternal Grandmother    Cancer Maternal Grandfather    Leukemia Paternal Grandmother    Cancer Paternal Grandfather     Current Outpatient Medications:    acetaminophen (TYLENOL) 500 MG tablet, Take 1 tablet (500 mg total) by mouth every 6 (six) hours as needed., Disp: 30 tablet, Rfl: 0   amLODipine (NORVASC) 5 MG tablet, Take 1 tablet (5 mg total) by mouth daily., Disp: 90 tablet, Rfl: 3   clindamycin (CLEOCIN T) 1 % SWAB, Use twice daily to affected areas. For boils, Disp: 180 each, Rfl: 3   diclofenac (VOLTAREN) 75  MG EC tablet, TAKE 1 TABLET 2 TIMES DAILY AS NEEDED FOR MODERATE PAIN, Disp: 180 tablet, Rfl: 0   EPINEPHrine 0.3 mg/0.3 mL IJ SOAJ injection, Inject 0.3 mg into the muscle as needed for anaphylaxis (severe allergic reaction, then call 911)., Disp: 1 each, Rfl: 0   fluticasone (FLONASE) 50 MCG/ACT nasal spray, Place 2 sprays into both nostrils daily., Disp: 16 g, Rfl: 6   glucose blood test strip, UAD to check sugar twice daily E11.649 (using accuchek), Disp: 100 each, Rfl: 12   Lancet Device MISC, UAD to check sugar twice daily E11.649 (using Accucheck machine), Disp: 100 each, Rfl: 12   linaclotide (LINZESS) 290 MCG CAPS capsule, Take 1 capsule (290 mcg total) by mouth daily before breakfast., Disp: 8 capsule, Rfl: 0   meclizine (ANTIVERT) 25 MG tablet, Take 1 tablet (25 mg total) by mouth 3 (three) times daily as needed for dizziness., Disp: 30 tablet, Rfl: 0   montelukast (SINGULAIR) 10 MG tablet, TAKE ONE TABLET AT BEDTIME, Disp: 90 tablet, Rfl: 3   omeprazole (PRILOSEC) 40 MG capsule, Take 1 capsule (40 mg total) by mouth daily., Disp: 30 capsule, Rfl: 1   ondansetron (ZOFRAN) 4 MG tablet, TAKE ONE TABLET EVERY 8 HOURS AS NEEDED FOR NAUSEA AND VOMITING, Disp: 20 tablet, Rfl: 0   sulfamethoxazole-trimethoprim (BACTRIM DS) 800-160 MG tablet,  Take 1 tablet by mouth 2 (two) times daily. X7 days per HS flareup., Disp: 30 tablet, Rfl: 2   tirzepatide (MOUNJARO) 12.5 MG/0.5ML Pen, Inject 12.5 mg into the skin once a week., Disp: 6 mL, Rfl: 0   tirzepatide (MOUNJARO) 15 MG/0.5ML Pen, Inject 15 mg into the skin once a week., Disp: 6 mL, Rfl: 0   traZODone (DESYREL) 50 MG tablet, Take 0.5-2 tablets (25-100 mg total) by mouth at bedtime as needed for sleep., Disp: 60 tablet, Rfl: 3  Allergies  Allergen Reactions   Keflex [Cephalexin]     Yeast infections    Coconut Fatty Acids Itching and Rash     ROS: Review of Systems Pertinent items noted in HPI and remainder of comprehensive ROS otherwise negative.    Physical exam BP 132/77   Pulse 84   Temp 98.3 F (36.8 C)   Ht 5\' 10"  (1.778 m)   Wt (!) 363 lb (164.7 kg)   LMP 08/15/2022   SpO2 97%   BMI 52.09 kg/m  General appearance: alert, cooperative, appears stated age, and no distress Head: Normocephalic, without obvious abnormality, atraumatic Eyes: negative findings: lids and lashes normal, conjunctivae and sclerae normal, corneas clear, and pupils equal, round, reactive to light and accomodation Ears: normal TM's and external ear canals both ears Nose: Nares normal. Septum midline. Mucosa normal. No drainage or sinus tenderness. Throat: lips, mucosa, and tongue normal; teeth and gums normal Neck: no adenopathy, supple, symmetrical, trachea midline, and thyroid not enlarged, symmetric, no tenderness/mass/nodules Back: symmetric, no curvature. ROM normal. No CVA tenderness. Lungs: clear to auscultation bilaterally Heart: regular rate and rhythm, S1, S2 normal, no murmur, click, rub or gallop Abdomen:  obese, nondistended but full Extremities: extremities normal, atraumatic, no cyanosis or edema Pulses: 2+ and symmetric Skin:  She has a small abscess noted along the left middle back beneath a skin fold Lymph nodes: Cervical, supraclavicular, and axillary nodes  normal. Neurologic: Grossly normal  JOINT INJECTION:  Patient denies allergy to antiseptics (including iodine) and anesthetics.  Patient has h/o controlled diabetes.  She gets corticosteroid injections in her knees q3-50months. Use of blood thinners/  antiplatelets.  Patient was given informed consent and a signed copy has been placed in the chart. Appropriate time out was taken. Area prepped and draped in usual sterile fashion. Anatomic landmarks were identified and injection site was marked.  Ethyl chloride spray was used to numb the area and 1 cc of methylprednisolone 40 mg/ml plus  3 cc of 1% lidocaine without epinephrine was injected into the right knee using a(n) anteriolateral approach. The patient tolerated the procedure well and there were no immediate complications. Estimated blood loss is less than 1 cc.  Post procedure instructions were reviewed and handout outlining these instructions were provided to patient.   JOINT INJECTION:  Patient was given informed consent and a signed copy has been placed in the chart. Appropriate time out was taken. Area prepped and draped in usual sterile fashion. Anatomic landmarks were identified and injection site was marked.  Ethyl chloride spray was used to numb the area and 1 cc of methylprednisolone 40 mg/ml plus  3 cc of 1% lidocaine without epinephrine was injected into the left knee using a(n) anteriolateral approach. She had discomfort with the needle placement and the needle was redirected and injection administered. Estimated blood loss is less than 1 cc.  Post procedure instructions were reviewed and handout outlining these instructions were provided to patient.  Assessment/ Plan: Marilyn Rivera here for annual physical exam.   Annual physical exam  Type 2 diabetes mellitus with other specified complication, without long-term current use of insulin (HCC) - Plan: CMP14+EGFR, Lipid Panel, TSH, Microalbumin / creatinine urine  ratio  Hypertension associated with diabetes (HCC) - Plan: CMP14+EGFR, amLODipine (NORVASC) 5 MG tablet  Morbid obesity (HCC) - Plan: CMP14+EGFR, TSH, VITAMIN D 25 Hydroxy (Vit-D Deficiency, Fractures)  History of colonic diverticulitis - Plan: Ambulatory referral to Gastroenterology, CBC  Drug-induced constipation - Plan: Ambulatory referral to Gastroenterology, linaclotide (LINZESS) 145 MCG CAPS capsule  Hidradenitis suppurativa - Plan: CBC, sulfamethoxazole-trimethoprim (BACTRIM DS) 800-160 MG tablet, clindamycin (CLEOCIN T) 1 % SWAB  Screening for malignant neoplasm of cervix - Plan: Ambulatory referral to Obstetrics / Gynecology  Bilateral chronic knee pain  Check A1c, urine microalbumin, fasting labs.  Blood pressure controlled.  No changes needed.  Patient needs to schedule diabetic eye exam but we will see if we can get it coordinated here for retinal exam  Continue GIP for both aid in sugar reduction and morbid obesity.  I have referred her to gastroenterology for diverticulitis recurrence.  She continues to have issues with constipation, which I do think is likely compounded by use of GIP.  Unfortunately she has been limited in treatments because things at work like Linzess have not been well covered by insurance.  I gave her sample of Trulance today to see if perhaps this would be helpful.  We can try prescribing that instead.  Having a flareup of hidradenitis so I have renewed her Septra.  Clindamycin also renewed.  Referral to OB/GYN for Pap smear  Corticosteroid injection administered bilaterally.  She did have some discomfort on the left at this time.  I do worry about progression of her OA and we may need to consider referral back to orthopedics for further management going forward, particularly if she does not find that today's injection was helpful.  Counseled on healthy lifestyle choices, including diet (rich in fruits, vegetables and lean meats and low in salt and  simple carbohydrates) and exercise (at least 30 minutes of moderate physical activity daily).  Patient to follow up  3 to 6 months, sooner if concerns arise  Aylani Spurlock M. Nadine Counts, DO

## 2022-09-11 ENCOUNTER — Other Ambulatory Visit: Payer: Self-pay | Admitting: Family Medicine

## 2022-09-11 ENCOUNTER — Ambulatory Visit: Payer: Managed Care, Other (non HMO) | Admitting: Family Medicine

## 2022-09-11 DIAGNOSIS — E559 Vitamin D deficiency, unspecified: Secondary | ICD-10-CM

## 2022-09-11 LAB — CMP14+EGFR
ALT: 24 IU/L (ref 0–32)
AST: 17 IU/L (ref 0–40)
Albumin: 3.8 g/dL — ABNORMAL LOW (ref 3.9–4.9)
Alkaline Phosphatase: 79 IU/L (ref 44–121)
BUN: 16 mg/dL (ref 6–24)
Bilirubin Total: <0.2 mg/dL (ref 0.0–1.2)
CO2: 19 mmol/L — ABNORMAL LOW (ref 20–29)
Calcium: 8.8 mg/dL (ref 8.7–10.2)
Globulin, Total: 2.9 g/dL (ref 1.5–4.5)
Glucose: 90 mg/dL (ref 70–99)
Potassium: 4.7 mmol/L (ref 3.5–5.2)
Total Protein: 6.7 g/dL (ref 6.0–8.5)
eGFR: 76 mL/min/{1.73_m2} (ref >59)

## 2022-09-11 LAB — MICROALBUMIN / CREATININE URINE RATIO
Creatinine, Urine: 142.3 mg/dL
Microalb/Creat Ratio: 8 mg/g creat (ref 0–29)
Microalbumin, Urine: 10.7 ug/mL

## 2022-09-11 LAB — CBC
Hematocrit: 38.3 % (ref 34.0–46.6)
MCH: 25.3 pg — ABNORMAL LOW (ref 26.6–33.0)
MCHC: 30.5 g/dL — ABNORMAL LOW (ref 31.5–35.7)
Platelets: 243 10*3/uL (ref 150–450)
RBC: 4.62 x10E6/uL (ref 3.77–5.28)
RDW: 15.7 % — ABNORMAL HIGH (ref 11.7–15.4)
WBC: 8.7 10*3/uL (ref 3.4–10.8)

## 2022-09-11 LAB — LIPID PANEL
Chol/HDL Ratio: 3.1 ratio (ref 0.0–4.4)
Cholesterol, Total: 117 mg/dL (ref 100–199)
HDL: 38 mg/dL — ABNORMAL LOW (ref >39)
LDL Chol Calc (NIH): 65 mg/dL (ref 0–99)
Triglycerides: 69 mg/dL (ref 0–149)
VLDL Cholesterol Cal: 14 mg/dL (ref 5–40)

## 2022-09-11 LAB — TSH: TSH: 2.84 u[IU]/mL (ref 0.450–4.500)

## 2022-09-11 LAB — VITAMIN D 25 HYDROXY (VIT D DEFICIENCY, FRACTURES): Vit D, 25-Hydroxy: 14.5 ng/mL — ABNORMAL LOW (ref 30.0–100.0)

## 2022-09-11 MED ORDER — VITAMIN D (ERGOCALCIFEROL) 1.25 MG (50000 UNIT) PO CAPS
50000.0000 [IU] | ORAL_CAPSULE | ORAL | 3 refills | Status: DC
Start: 2022-09-11 — End: 2022-11-15

## 2022-09-12 ENCOUNTER — Ambulatory Visit
Admission: RE | Admit: 2022-09-12 | Discharge: 2022-09-12 | Disposition: A | Discharge status: Home / Self Care | Payer: Managed Care, Other (non HMO) | Source: Ambulatory Visit

## 2022-09-12 DIAGNOSIS — Z1231 Encounter for screening mammogram for malignant neoplasm of breast: Secondary | ICD-10-CM

## 2022-09-16 ENCOUNTER — Encounter: Payer: Self-pay | Admitting: Family Medicine

## 2022-09-16 NOTE — Telephone Encounter (Signed)
If we have any linzess , please offer to Marilyn Rivera

## 2022-09-24 ENCOUNTER — Other Ambulatory Visit: Payer: Self-pay | Admitting: Family Medicine

## 2022-09-24 DIAGNOSIS — R110 Nausea: Secondary | ICD-10-CM

## 2022-10-07 ENCOUNTER — Encounter: Payer: Self-pay | Admitting: Family Medicine

## 2022-10-07 DIAGNOSIS — R110 Nausea: Secondary | ICD-10-CM

## 2022-10-07 MED ORDER — ONDANSETRON HCL 4 MG PO TABS
ORAL_TABLET | ORAL | 1 refills | Status: DC
Start: 2022-10-07 — End: 2022-11-20

## 2022-10-07 MED ORDER — CYCLOBENZAPRINE HCL 5 MG PO TABS: 5.0000 mg | ORAL_TABLET | Freq: Three times a day (TID) | ORAL | 1 refills | Status: DC | PRN

## 2022-10-07 NOTE — Telephone Encounter (Signed)
Please see the MyChart message reply(ies) for my assessment and plan.    This patient gave consent for this Medical Advice Message and is aware that it may result in a bill to their insurance company, as well as the possibility of receiving a bill for a co-payment or deductible. They are an established patient, but are not seeking medical advice exclusively about a problem treated during an in person or video visit in the last seven days. I did not recommend an in person or video visit within seven days of my reply.    I spent a total of 5 minutes cumulative time within 7 days through MyChart messaging.  Ashly Gottschalk, DO   

## 2022-10-11 LAB — HM DIABETES EYE EXAM

## 2022-10-14 ENCOUNTER — Encounter: Payer: Self-pay | Admitting: Family Medicine

## 2022-10-14 ENCOUNTER — Telehealth: Payer: Managed Care, Other (non HMO)

## 2022-10-15 ENCOUNTER — Ambulatory Visit: Payer: Managed Care, Other (non HMO) | Admitting: Family Medicine

## 2022-10-15 ENCOUNTER — Other Ambulatory Visit: Payer: Self-pay | Admitting: Family Medicine

## 2022-10-15 ENCOUNTER — Encounter: Payer: Self-pay | Admitting: Family Medicine

## 2022-10-15 VITALS — BP 127/85 | HR 72 | Temp 98.5°F | Ht 70.0 in | Wt 365.0 lb

## 2022-10-15 DIAGNOSIS — G8929 Other chronic pain: Secondary | ICD-10-CM

## 2022-10-15 DIAGNOSIS — M7712 Lateral epicondylitis, left elbow: Secondary | ICD-10-CM

## 2022-10-15 MED ORDER — OMEPRAZOLE 40 MG PO CPDR: 40.0000 mg | DELAYED_RELEASE_CAPSULE | Freq: Every day | ORAL | 1 refills | Status: DC

## 2022-10-15 MED ORDER — DICLOFENAC SODIUM 75 MG PO TBEC
75.0000 mg | DELAYED_RELEASE_TABLET | Freq: Two times a day (BID) | ORAL | 3 refills | Status: DC
Start: 2022-10-15 — End: 2022-11-19

## 2022-10-15 NOTE — Telephone Encounter (Signed)
done

## 2022-10-15 NOTE — Patient Instructions (Signed)
Golfer's Elbow  Golfer's elbow (medial epicondylitis) is a condition that results from inflammation of the strong bands of tissue (tendons) that attach your forearm muscles to the inside of your bone at the elbow. These tendons affect the muscles that bend the palm toward the wrist (flexion). The tendons become less flexible with age. This condition is called golfer's elbow because it is more common among people who constantly bend and twist their wrists, such as golfers. This injury is usually caused by repeated use of the same muscles. What are the causes? This condition is caused by: Repeatedly flexing, turning, or twisting your wrist. Frequently gripping objects with your hands. Sudden injury. What increases the risk? This condition is more likely to develop in people who play golf, baseball, or tennis. This injury is more common among people who have jobs that require the constant use of their hands, such as: People who use computers. Carpenters. Butchers. Musicians. What are the signs or symptoms? This condition causes elbow pain that may spread to your forearm and upper arm. Symptoms of this condition include: Pain at the inner elbow, forearm, or wrist. A weak grip in the hand. The pain may get worse when you bend your wrist downward. How is this diagnosed? This condition is diagnosed based on your symptoms, your medical history, and a physical exam. During the exam, your health care provider may: Test your grip strength. Move your wrist to check for pain. You may also have an MRI to: Confirm the diagnosis. Look for other issues. Check for tears in the ligaments, muscles, or tendons. How is this treated? Treatment for this condition includes: Stopping all activities that make you bend or twist your elbow or wrist and waiting until your pain and other symptoms go away before resuming those activities. Wearing an elbow brace or wrist splint to restrict the movements that cause  symptoms. Icing your inner elbow, forearm, or wrist to relieve pain. Taking NSAIDs, such as ibuprofen, or getting corticosteroid injections to reduce pain and swelling. Doing stretching, range-of-motion, and strengthening exercises (physical therapy) as told by your health care provider. In rare cases, surgery may be needed if your condition does not improve. Follow these instructions at home: If you have a brace or splint: Wear the brace or splint as told by your health care provider. Remove it only as told by your health care provider. Check the skin around the brace or splint every day. Tell your health care provider about any concerns. Loosen the brace or splint if your fingers tingle, become numb, or turn cold and blue. Keep it clean. If the brace or splint is not waterproof: Do not let it get wet. Cover it with a watertight covering when you take a bath or shower. Managing pain, stiffness, and swelling  If directed, put ice on the injured area. To do this: If you have a removable brace or splint, remove it as told by your health care provider. Put ice in a plastic bag. Place a towel between your skin and the bag. Leave the ice on for 20 minutes, 2-3 times a day. Remove the ice if your skin turns bright red. This is very important. If you cannot feel pain, heat, or cold, you have a greater risk of damage to the area. Move your fingers often to avoid stiffness and swelling. Activity Rest your injured area as told by your health care provider. Return to your normal activities as told by your health care provider. Ask your health care  provider what activities are safe for you. Do exercises as told by your health care provider. Lifestyle If your condition is caused by sports, work with a trainer to make sure that you: Use the correct technique. Use the proper equipment. If your condition is work related, talk with your employer about ways to manage your condition at work. General  instructions Take over-the-counter and prescription medicines only as told by your health care provider. Do not use any products that contain nicotine or tobacco. These products include cigarettes, chewing tobacco, and vaping devices, such as e-cigarettes. If you need help quitting, ask your health care provider. Keep all follow-up visits. This is important. How is this prevented? Before and after activity: Warm up and stretch before being active. Cool down and stretch after being active. Give your body time to rest between periods of activity. During activity: Make sure to use equipment that fits you. If you play golf, slow your golf swing to reduce shock in the arm when making contact with the ball. Maintain physical fitness, including: Strength. Flexibility. Endurance. Do exercises to strengthen the forearm muscles. Contact a health care provider if: Your pain does not improve or it gets worse. You notice numbness in your hand. Get help right away if: Your pain is severe. You cannot move your wrist. Summary Golfer's elbow, also called medial epicondylitis, is a condition that results from inflammation of the strong bands of tissue (tendons) that attach your forearm muscles to the inside of your bone at the elbow. This injury usually results from overuse. Symptoms of this condition include decreased grip strength and pain at the inner elbow, forearm, or wrist. This injury is treated with rest, a brace or splint, ice, medicines, physical therapy, and surgery as needed. This information is not intended to replace advice given to you by your health care provider. Make sure you discuss any questions you have with your health care provider. Document Revised: 08/24/2019 Document Reviewed: 08/24/2019 Elsevier Patient Education  2024 [employer].

## 2022-10-15 NOTE — Progress Notes (Signed)
Subjective: CC: Left arm pain PCP: [name], DO [id number] Marilyn Rivera is a 42 y.o. female presenting to clinic today for:  1.  Arm pain Patient reports several day history of left-sided arm pain.  She points to the lateral aspect of the elbow extending over to the forearm.  She notes that this occurs randomly and she describes the pain as stabbing and sharp that last 4 to 5 seconds before resolving.  She has a baseline general soreness in that area however most time.  She has been applying the lidocaine patches in efforts to alleviate pain.  She is compliant with daily diclofenac.  She does report that occasionally it does seem to be swollen.  She has tried heat and ice but nothing has resolved the pain.  She denies any weakness, sensory changes.  No preceding injury.  She works seated most times.   ROS: Per HPI  Allergies  Allergen Reactions   Keflex [Cephalexin]     Yeast infections    Coconut Fatty Acids Itching and Rash   Past Medical History:  Diagnosis Date   Arthritis    Diverticulitis large intestine w/o perforation or abscess w/o bleeding 10/05/2014   Diverticulosis    GERD (gastroesophageal reflux disease)    history of H Pylori   Helicobacter pylori gastritis 09/06/2014   History of kidney stones    Iron deficiency anemia    Kidney stones     Current Outpatient Medications:    acetaminophen (TYLENOL) 500 MG tablet, Take 1 tablet (500 mg total) by mouth every 6 (six) hours as needed., Disp: 30 tablet, Rfl: 0   amLODipine (NORVASC) 5 MG tablet, Take 1 tablet (5 mg total) by mouth daily., Disp: 90 tablet, Rfl: 3   clindamycin (CLEOCIN T) 1 % SWAB, Use twice daily to affected areas. For boils, Disp: 180 each, Rfl: 3   cyclobenzaprine (FLEXERIL) 5 MG tablet, Take 1 tablet (5 mg total) by mouth 3 (three) times daily as needed for muscle spasms., Disp: 90 tablet, Rfl: 1   diclofenac (VOLTAREN) 75 MG EC tablet, TAKE 1 TABLET 2 TIMES DAILY AS NEEDED FOR MODERATE  PAIN, Disp: 180 tablet, Rfl: 0   EPINEPHrine 0.3 mg/0.3 mL IJ SOAJ injection, Inject 0.3 mg into the muscle as needed for anaphylaxis (severe allergic reaction, then call 911)., Disp: 1 each, Rfl: 0   fluticasone (FLONASE) 50 MCG/ACT nasal spray, Place 2 sprays into both nostrils daily., Disp: 16 g, Rfl: 6   glucose blood test strip, UAD to check sugar twice daily E11.649 (using accuchek), Disp: 100 each, Rfl: 12   Lancet Device MISC, UAD to check sugar twice daily E11.649 (using Accucheck machine), Disp: 100 each, Rfl: 12   linaclotide (LINZESS) 145 MCG CAPS capsule, Take 1 capsule (145 mcg total) by mouth daily before breakfast., Disp: 90 capsule, Rfl: 3   meclizine (ANTIVERT) 25 MG tablet, Take 1 tablet (25 mg total) by mouth 3 (three) times daily as needed for dizziness., Disp: 30 tablet, Rfl: 0   montelukast (SINGULAIR) 10 MG tablet, Take 1 tablet (10 mg total) by mouth at bedtime., Disp: 90 tablet, Rfl: 3   omeprazole (PRILOSEC) 40 MG capsule, Take 1 capsule (40 mg total) by mouth daily., Disp: 30 capsule, Rfl: 1   ondansetron (ZOFRAN) 4 MG tablet, TAKE ONE TABLET EVERY 8 HOURS AS NEEDED FOR NAUSEA AND VOMITING, Disp: 20 tablet, Rfl: 1   sulfamethoxazole-trimethoprim (BACTRIM DS) 800-160 MG tablet, Take 1 tablet by mouth 2 (two) times  daily. X7 days per HS flareup., Disp: 30 tablet, Rfl: 2   tirzepatide (MOUNJARO) 12.5 MG/0.5ML Pen, Inject 12.5 mg into the skin once a week., Disp: 6 mL, Rfl: 0   tirzepatide (MOUNJARO) 15 MG/0.5ML Pen, Inject 15 mg into the skin once a week., Disp: 6 mL, Rfl: 0   traZODone (DESYREL) 50 MG tablet, Take 0.5-2 tablets (25-100 mg total) by mouth at bedtime as needed for sleep., Disp: 60 tablet, Rfl: 3   Vitamin D, Ergocalciferol, (DRISDOL) 1.25 MG (50000 UNIT) CAPS capsule, Take 1 capsule (50,000 Units total) by mouth every 7 (seven) days., Disp: 12 capsule, Rfl: 3 Social History   Socioeconomic History   Marital status: Divorced    Spouse name: Not on file    Number of children: 0   Years of education: Not on file   Highest education level: Associate degree: occupational, Scientist, product/process development, or vocational program  Occupational History   Occupation: Estate agent, Nurse, adult for resource side  Tobacco Use   Smoking status: Never   Smokeless tobacco: Never  Vaping Use   Vaping status: Never Used  Substance and Sexual Activity   Alcohol use: No   Drug use: No   Sexual activity: Not on file  Other Topics Concern   Not on file  Social History Narrative   Not on file   Social Determinants of Health   Financial Resource Strain: Low Risk  (09/09/2022)   Overall Financial Resource Strain (CARDIA)    Difficulty of Paying Living Expenses: Not hard at all  Food Insecurity: No Food Insecurity (09/09/2022)   Hunger Vital Sign    Worried About Running Out of Food in the Last Year: Never true    Ran Out of Food in the Last Year: Never true  Transportation Needs: No Transportation Needs (09/09/2022)   PRAPARE - Administrator, Civil Service (Medical): No    Lack of Transportation (Non-Medical): No  Physical Activity: Insufficiently Active (09/09/2022)   Exercise Vital Sign    Days of Exercise per Week: 2 days    Minutes of Exercise per Session: 30 min  Stress: Stress Concern Present (09/09/2022)   [employer] of Occupational Health - Occupational Stress Questionnaire    Feeling of Stress : To some extent  Social Connections: Moderately Integrated (09/09/2022)   Social Connection and Isolation Panel [NHANES]    Frequency of Communication with Friends and Family: More than three times a week    Frequency of Social Gatherings with Friends and Family: Three times a week    Attends Religious Services: More than 4 times per year    Active Member of Clubs or Organizations: Yes    Attends Banker Meetings: More than 4 times per year    Marital Status: Divorced  Intimate Partner Violence: Unknown (09/02/2021)   Received from [facility]    HITS    Physically Hurt: Not on file    Insult or Talk Down To: Not on file    Threaten Physical Harm: Not on file    Scream or Curse: Not on file   Family History  Problem Relation Age of Onset   Heart failure Mother    Hypertension Mother    Heart disease Mother    Cancer Other        mother's side of family, breast cancer   Cancer Other        father's side of family, leukemia   Hypertension Father    Stroke Father 71   Diabetes  Other    Hypertension Other    Colon cancer Cousin    Healthy Brother    Breast cancer Maternal Aunt 66   Leukemia Paternal Aunt    Kidney disease Maternal Grandmother    Hypertension Maternal Grandmother    Cancer Maternal Grandfather    Leukemia Paternal Grandmother    Cancer Paternal Grandfather     Objective: Office vital signs reviewed. BP 127/85   Pulse 72   Temp 98.5 F (36.9 C)   Ht 5\' 10"  (1.778 m)   Wt (!) 365 lb (165.6 kg)   SpO2 95%   BMI 52.37 kg/m   Physical Examination:  General: Awake, alert, No acute distress MSK: No gross soft tissue swelling, erythema or induration appreciated.  Mild tenderness to palpation to lateral epicondyle of the left upper extremity.  She has full active range of motion that is painless.  Assessment/ Plan: 42 y.o. female   Lateral epicondylitis of left elbow  Suspect lateral epicondylitis of the left elbow.  She was tender over the lateral epicondyles but there was no significant soft tissue swelling appreciated in this area.  Discussed use of ice, continued use of NSAIDs, use of golfers elbow band.  Offered referral for PT  but declines this for now.  Could consider referral to ortho for further eval under u/s if no resolution with conservative treatments.   [name], DO [facility] [phone]

## 2022-10-21 ENCOUNTER — Encounter: Payer: Self-pay | Admitting: Family Medicine

## 2022-10-21 ENCOUNTER — Other Ambulatory Visit: Payer: Self-pay | Admitting: Family Medicine

## 2022-10-21 DIAGNOSIS — E1169 Type 2 diabetes mellitus with other specified complication: Secondary | ICD-10-CM

## 2022-11-05 ENCOUNTER — Encounter: Payer: Self-pay | Admitting: Family Medicine

## 2022-11-05 DIAGNOSIS — M7712 Lateral epicondylitis, left elbow: Secondary | ICD-10-CM

## 2022-11-05 NOTE — Telephone Encounter (Signed)
done

## 2022-11-15 ENCOUNTER — Emergency Department (HOSPITAL_COMMUNITY): Payer: Managed Care, Other (non HMO)

## 2022-11-15 ENCOUNTER — Encounter (HOSPITAL_COMMUNITY): Payer: Self-pay | Admitting: Emergency Medicine

## 2022-11-15 ENCOUNTER — Inpatient Hospital Stay (HOSPITAL_COMMUNITY)
Admission: EM | Admit: 2022-11-15 | Discharge: 2022-11-19 | DRG: 392 | Disposition: A | Discharge status: Home / Self Care | Payer: Managed Care, Other (non HMO) | Attending: Student | Admitting: Student

## 2022-11-15 ENCOUNTER — Other Ambulatory Visit: Payer: Self-pay

## 2022-11-15 DIAGNOSIS — M1990 Unspecified osteoarthritis, unspecified site: Secondary | ICD-10-CM | POA: Diagnosis present

## 2022-11-15 DIAGNOSIS — K5900 Constipation, unspecified: Secondary | ICD-10-CM | POA: Diagnosis present

## 2022-11-15 DIAGNOSIS — Z8249 Family history of ischemic heart disease and other diseases of the circulatory system: Secondary | ICD-10-CM

## 2022-11-15 DIAGNOSIS — Z79899 Other long term (current) drug therapy: Secondary | ICD-10-CM

## 2022-11-15 DIAGNOSIS — E1169 Type 2 diabetes mellitus with other specified complication: Secondary | ICD-10-CM | POA: Diagnosis present

## 2022-11-15 DIAGNOSIS — I152 Hypertension secondary to endocrine disorders: Secondary | ICD-10-CM | POA: Diagnosis present

## 2022-11-15 DIAGNOSIS — Z8619 Personal history of other infectious and parasitic diseases: Secondary | ICD-10-CM

## 2022-11-15 DIAGNOSIS — D509 Iron deficiency anemia, unspecified: Secondary | ICD-10-CM | POA: Diagnosis present

## 2022-11-15 DIAGNOSIS — Z87442 Personal history of urinary calculi: Secondary | ICD-10-CM

## 2022-11-15 DIAGNOSIS — R1032 Left lower quadrant pain: Secondary | ICD-10-CM | POA: Diagnosis not present

## 2022-11-15 DIAGNOSIS — N920 Excessive and frequent menstruation with regular cycle: Secondary | ICD-10-CM | POA: Diagnosis present

## 2022-11-15 DIAGNOSIS — K529 Noninfective gastroenteritis and colitis, unspecified: Principal | ICD-10-CM

## 2022-11-15 DIAGNOSIS — Z9049 Acquired absence of other specified parts of digestive tract: Secondary | ICD-10-CM

## 2022-11-15 DIAGNOSIS — E538 Deficiency of other specified B group vitamins: Secondary | ICD-10-CM | POA: Diagnosis present

## 2022-11-15 DIAGNOSIS — E1159 Type 2 diabetes mellitus with other circulatory complications: Secondary | ICD-10-CM | POA: Diagnosis present

## 2022-11-15 DIAGNOSIS — Z23 Encounter for immunization: Secondary | ICD-10-CM

## 2022-11-15 DIAGNOSIS — Z7985 Long-term (current) use of injectable non-insulin antidiabetic drugs: Secondary | ICD-10-CM

## 2022-11-15 DIAGNOSIS — Z833 Family history of diabetes mellitus: Secondary | ICD-10-CM

## 2022-11-15 DIAGNOSIS — Z881 Allergy status to other antibiotic agents status: Secondary | ICD-10-CM

## 2022-11-15 DIAGNOSIS — E860 Dehydration: Secondary | ICD-10-CM | POA: Diagnosis present

## 2022-11-15 DIAGNOSIS — Z6841 Body Mass Index (BMI) 40.0 and over, adult: Secondary | ICD-10-CM

## 2022-11-15 DIAGNOSIS — K219 Gastro-esophageal reflux disease without esophagitis: Secondary | ICD-10-CM | POA: Diagnosis present

## 2022-11-15 DIAGNOSIS — E876 Hypokalemia: Secondary | ICD-10-CM | POA: Diagnosis present

## 2022-11-15 DIAGNOSIS — Z91018 Allergy to other foods: Secondary | ICD-10-CM

## 2022-11-15 LAB — COMPREHENSIVE METABOLIC PANEL
ALT: 17 U/L (ref 0–44)
AST: 24 U/L (ref 15–41)
Albumin: 3.5 g/dL (ref 3.5–5.0)
Alkaline Phosphatase: 58 U/L (ref 38–126)
Anion gap: 12 (ref 5–15)
BUN: 13 mg/dL (ref 6–20)
CO2: 20 mmol/L — ABNORMAL LOW (ref 22–32)
Calcium: 8.6 mg/dL — ABNORMAL LOW (ref 8.9–10.3)
Chloride: 104 mmol/L (ref 98–111)
Creatinine, Ser: 1.03 mg/dL — ABNORMAL HIGH (ref 0.44–1.00)
GFR, Estimated: >60 mL/min (ref >60)
Glucose, Bld: 113 mg/dL — ABNORMAL HIGH (ref 70–99)
Potassium: 3.5 mmol/L (ref 3.5–5.1)
Sodium: 136 mmol/L (ref 135–145)
Total Bilirubin: 0.9 mg/dL (ref 0.3–1.2)
Total Protein: 6.9 g/dL (ref 6.5–8.1)

## 2022-11-15 LAB — URINALYSIS, ROUTINE W REFLEX MICROSCOPIC
Bacteria, UA: NONE SEEN
Bilirubin Urine: NEGATIVE
Glucose, UA: NEGATIVE mg/dL
Hgb urine dipstick: NEGATIVE
Ketones, ur: 20 mg/dL — AB
Leukocytes,Ua: NEGATIVE
Nitrite: NEGATIVE
Protein, ur: 30 mg/dL — AB
Specific Gravity, Urine: >1.046 — ABNORMAL HIGH (ref 1.005–1.030)
pH: 5 (ref 5.0–8.0)

## 2022-11-15 LAB — CBC WITH DIFFERENTIAL/PLATELET
Abs Immature Granulocytes: 0.06 10*3/uL (ref 0.00–0.07)
Basophils Absolute: 0 10*3/uL (ref 0.0–0.1)
Basophils Relative: 0 %
Eosinophils Absolute: 0 10*3/uL (ref 0.0–0.5)
Eosinophils Relative: 0 %
HCT: 34.3 % — ABNORMAL LOW (ref 36.0–46.0)
Hemoglobin: 10.7 g/dL — ABNORMAL LOW (ref 12.0–15.0)
Immature Granulocytes: 0 %
Lymphocytes Relative: 9 %
Lymphs Abs: 1.3 10*3/uL (ref 0.7–4.0)
MCH: 24.5 pg — ABNORMAL LOW (ref 26.0–34.0)
MCHC: 31.2 g/dL (ref 30.0–36.0)
MCV: 78.5 fL — ABNORMAL LOW (ref 80.0–100.0)
Monocytes Absolute: 1.2 10*3/uL — ABNORMAL HIGH (ref 0.1–1.0)
Monocytes Relative: 8 %
Neutro Abs: 11.2 10*3/uL — ABNORMAL HIGH (ref 1.7–7.7)
Neutrophils Relative %: 83 %
Platelets: 274 10*3/uL (ref 150–400)
RBC: 4.37 MIL/uL (ref 3.87–5.11)
RDW: 16.8 % — ABNORMAL HIGH (ref 11.5–15.5)
WBC: 13.7 10*3/uL — ABNORMAL HIGH (ref 4.0–10.5)
nRBC: 0 % (ref 0.0–0.2)

## 2022-11-15 LAB — HCG, QUANTITATIVE, PREGNANCY: hCG, Beta Chain, Quant, S: <1 m[IU]/mL (ref <5)

## 2022-11-15 LAB — LACTIC ACID, PLASMA: Lactic Acid, Venous: 1.7 mmol/L (ref 0.5–1.9)

## 2022-11-15 LAB — LIPASE, BLOOD: Lipase: 21 U/L (ref 11–51)

## 2022-11-15 MED ORDER — TRAZODONE HCL 50 MG PO TABS: 50.0000 mg | ORAL_TABLET | Freq: Every evening | ORAL | Status: DC | PRN

## 2022-11-15 MED ORDER — LACTULOSE 10 GM/15ML PO SOLN
30.0000 g | Freq: Once | ORAL | Status: AC
  Administered 2022-11-15: 30 g via ORAL
  Filled 2022-11-15: qty 60

## 2022-11-15 MED ORDER — SODIUM CHLORIDE 0.9 % IV SOLN: INTRAVENOUS | Status: DC | PRN

## 2022-11-15 MED ORDER — OXYCODONE HCL 5 MG PO TABS
5.0000 mg | ORAL_TABLET | ORAL | Status: DC | PRN
  Administered 2022-11-15 – 2022-11-17 (×8): 5 mg via ORAL
  Filled 2022-11-15 (×9): qty 1

## 2022-11-15 MED ORDER — LACTATED RINGERS IV BOLUS
1000.0000 mL | Freq: Once | INTRAVENOUS | Status: AC
  Administered 2022-11-15: 1000 mL via INTRAVENOUS

## 2022-11-15 MED ORDER — MORPHINE SULFATE (PF) 4 MG/ML IV SOLN
4.0000 mg | Freq: Once | INTRAVENOUS | Status: AC
  Administered 2022-11-15: 4 mg via INTRAVENOUS
  Filled 2022-11-15: qty 1

## 2022-11-15 MED ORDER — HEPARIN SODIUM (PORCINE) 5000 UNIT/ML IJ SOLN
5000.0000 [IU] | Freq: Three times a day (TID) | INTRAMUSCULAR | Status: DC
  Administered 2022-11-15 – 2022-11-19 (×11): 5000 [IU] via SUBCUTANEOUS
  Filled 2022-11-15 (×10): qty 1

## 2022-11-15 MED ORDER — IOHEXOL 300 MG/ML  SOLN
100.0000 mL | Freq: Once | INTRAMUSCULAR | Status: AC | PRN
  Administered 2022-11-15: 100 mL via INTRAVENOUS

## 2022-11-15 MED ORDER — DICLOFENAC SODIUM 75 MG PO TBEC: 75.0000 mg | DELAYED_RELEASE_TABLET | Freq: Two times a day (BID) | ORAL | Status: DC

## 2022-11-15 MED ORDER — AMLODIPINE BESYLATE 5 MG PO TABS
5.0000 mg | ORAL_TABLET | Freq: Every day | ORAL | Status: DC
  Administered 2022-11-15 – 2022-11-16 (×2): 5 mg via ORAL
  Filled 2022-11-15 (×2): qty 1

## 2022-11-15 MED ORDER — SENNOSIDES-DOCUSATE SODIUM 8.6-50 MG PO TABS
2.0000 | ORAL_TABLET | Freq: Every day | ORAL | Status: DC
  Administered 2022-11-15 – 2022-11-16 (×2): 2 via ORAL
  Filled 2022-11-15 (×3): qty 2

## 2022-11-15 MED ORDER — SODIUM CHLORIDE 0.9 % IV SOLN
3.0000 g | Freq: Four times a day (QID) | INTRAVENOUS | Status: DC
  Administered 2022-11-15 – 2022-11-19 (×14): 3 g via INTRAVENOUS
  Filled 2022-11-15 (×16): qty 8

## 2022-11-15 MED ORDER — SODIUM CHLORIDE 0.9 % IV SOLN: INTRAVENOUS | Status: DC

## 2022-11-15 MED ORDER — SODIUM CHLORIDE 0.9 % IV SOLN
3.0000 g | Freq: Once | INTRAVENOUS | Status: AC
  Administered 2022-11-15: 3 g via INTRAVENOUS
  Filled 2022-11-15: qty 8

## 2022-11-15 MED ORDER — SODIUM CHLORIDE 0.9% FLUSH
3.0000 mL | Freq: Two times a day (BID) | INTRAVENOUS | Status: DC
  Administered 2022-11-16 – 2022-11-19 (×6): 3 mL via INTRAVENOUS

## 2022-11-15 MED ORDER — PROCHLORPERAZINE 25 MG RE SUPP
25.0000 mg | Freq: Once | RECTAL | Status: AC
  Administered 2022-11-15: 25 mg via RECTAL
  Filled 2022-11-15: qty 1

## 2022-11-15 MED ORDER — SODIUM CHLORIDE 0.9% FLUSH
3.0000 mL | Freq: Two times a day (BID) | INTRAVENOUS | Status: DC
  Administered 2022-11-16 – 2022-11-19 (×6): 3 mL via INTRAVENOUS

## 2022-11-15 MED ORDER — HYDROMORPHONE HCL 1 MG/ML IJ SOLN
1.0000 mg | Freq: Once | INTRAMUSCULAR | Status: AC
  Administered 2022-11-15: 1 mg via INTRAMUSCULAR
  Filled 2022-11-15: qty 1

## 2022-11-15 MED ORDER — ACETAMINOPHEN 650 MG RE SUPP: 650.0000 mg | Freq: Four times a day (QID) | RECTAL | Status: DC | PRN

## 2022-11-15 MED ORDER — ONDANSETRON HCL 4 MG/2ML IJ SOLN
4.0000 mg | Freq: Once | INTRAMUSCULAR | Status: AC
  Administered 2022-11-15: 4 mg via INTRAVENOUS
  Filled 2022-11-15: qty 2

## 2022-11-15 MED ORDER — ONDANSETRON HCL 4 MG PO TABS
4.0000 mg | ORAL_TABLET | Freq: Four times a day (QID) | ORAL | Status: DC | PRN
  Administered 2022-11-16: 4 mg via ORAL
  Filled 2022-11-15 (×2): qty 1

## 2022-11-15 MED ORDER — SODIUM CHLORIDE 0.9% FLUSH: 3.0000 mL | INTRAVENOUS | Status: DC | PRN

## 2022-11-15 MED ORDER — PANTOPRAZOLE SODIUM 40 MG PO TBEC
40.0000 mg | DELAYED_RELEASE_TABLET | Freq: Two times a day (BID) | ORAL | Status: DC
  Administered 2022-11-15 – 2022-11-19 (×8): 40 mg via ORAL
  Filled 2022-11-15 (×9): qty 1

## 2022-11-15 MED ORDER — FENTANYL CITRATE PF 50 MCG/ML IJ SOSY
50.0000 ug | PREFILLED_SYRINGE | INTRAMUSCULAR | Status: DC | PRN
  Administered 2022-11-15 – 2022-11-16 (×6): 50 ug via INTRAVENOUS
  Filled 2022-11-15 (×6): qty 1

## 2022-11-15 MED ORDER — BISACODYL 10 MG RE SUPP
10.0000 mg | Freq: Once | RECTAL | Status: AC
  Administered 2022-11-15: 10 mg via RECTAL
  Filled 2022-11-15: qty 1

## 2022-11-15 MED ORDER — HYDROMORPHONE HCL 1 MG/ML IJ SOLN
1.0000 mg | Freq: Once | INTRAMUSCULAR | Status: AC
  Administered 2022-11-15: 1 mg via INTRAVENOUS
  Filled 2022-11-15: qty 1

## 2022-11-15 MED ORDER — MONTELUKAST SODIUM 10 MG PO TABS
10.0000 mg | ORAL_TABLET | Freq: Every day | ORAL | Status: DC
  Administered 2022-11-15 – 2022-11-18 (×4): 10 mg via ORAL
  Filled 2022-11-15 (×4): qty 1

## 2022-11-15 MED ORDER — AMLODIPINE BESYLATE 5 MG PO TABS
5.0000 mg | ORAL_TABLET | Freq: Every day | ORAL | Status: DC
  Administered 2022-11-15: 5 mg via ORAL
  Filled 2022-11-15: qty 1

## 2022-11-15 MED ORDER — ACETAMINOPHEN 325 MG PO TABS
650.0000 mg | ORAL_TABLET | Freq: Four times a day (QID) | ORAL | Status: DC | PRN
  Administered 2022-11-17: 650 mg via ORAL
  Filled 2022-11-15: qty 2

## 2022-11-15 MED ORDER — POLYETHYLENE GLYCOL 3350 17 G PO PACK
17.0000 g | PACK | Freq: Every day | ORAL | Status: DC
  Administered 2022-11-16: 17 g via ORAL
  Filled 2022-11-15: qty 1

## 2022-11-15 MED ORDER — ONDANSETRON HCL 4 MG/2ML IJ SOLN
4.0000 mg | Freq: Four times a day (QID) | INTRAMUSCULAR | Status: DC | PRN
  Administered 2022-11-16 (×2): 4 mg via INTRAVENOUS
  Filled 2022-11-15 (×2): qty 2

## 2022-11-15 MED ORDER — HYDRALAZINE HCL 20 MG/ML IJ SOLN: 10.0000 mg | Freq: Four times a day (QID) | INTRAMUSCULAR | Status: DC | PRN

## 2022-11-15 NOTE — H&P (Signed)
Patient Demographics:    Marilyn Rivera, is a 42 y.o. female  MRN: [id number]   DOB - 01-23-1981  Admit Date - 11/15/2022  Outpatient Primary MD for the patient is [name], DO   Assessment & Plan:   Assessment and Plan 1) acute colitis--CT abdomen and pelvis suggest transverse colon and descending colon colitis -Leukocytosis noted Unasyn as ordered -Stool samples if diarrhea/loose stools -Patient has history of diverticulosis with prior colitis and prior partial colectomy--imaging studies at this time does not suggest diverticulitis  2) constipation--suspect some component of IBS--laxatives as ordered -PTA patient was on Linzess  3)Morbid Obesity- -patient reports that she lost about 150 pounds for the last year and a half while on Mounjaro -Low calorie diet, portion control and increase physical activity discussed with patient -Body mass index is 50.22 kg/m.  4)DM2-A1c 5.3 reflecting excellent diabetic control PTA Hold PTA Mounjaro  use Novolog/Humalog Sliding scale insulin with Accu-Cheks/Fingersticks as ordered   5) chronic iron deficiency anemia in the setting of menorrhagia-- -LMP 2 weeks ago -Hgb close to prior -Monitor closely  6)HTN--okay to use IV hydralazine as needed   Dispo: The patient is from: Home              Anticipated d/c is to: Home              Anticipated d/c date is: 1 day              Patient currently is not medically stable to d/c. Barriers: Not Clinically Stable-   With History of - Reviewed by me  Past Medical History:  Diagnosis Date   Arthritis    Diverticulitis large intestine w/o perforation or abscess w/o bleeding 10/05/2014   Diverticulosis    GERD (gastroesophageal reflux disease)    history of H Pylori   Helicobacter pylori gastritis 09/06/2014    History of kidney stones    Iron deficiency anemia    Kidney stones       Past Surgical History:  Procedure Laterality Date   CENTRAL VENOUS CATHETER INSERTION Right 06/02/2017   Procedure: INSERTION CENTRAL LINE RIGHT INTERNAL JUGULAR;  Surgeon: Lucretia Roers, MD;  Location: [facility];  Service: General;  Laterality: Right;   COLONOSCOPY N/A 06/10/2014   Dr. Darrick Penna; redundant sigmoid colon, moderate diverticulosis in the sigmoid and descending colon. small internal hemorrhoids. Next screening at age 76.    ESOPHAGOGASTRODUODENOSCOPY N/A 06/10/2014   Dr. Darrick Penna: H.pylori gastritis s/p treatment with Amoxicillin and Biaxin   KNEE SURGERY Right    PARTIAL COLECTOMY N/A 06/02/2017   Procedure: PARTIAL SIGMOID COLECTOMY (OPEN);  Surgeon: Lucretia Roers, MD;  Location: [facility];  Service: General;  Laterality: N/A;   renal calculi removal Left 2010   SKIN LESION EXCISION     over right eyebrow due to wax being left above eye and it seeped down into pore      Chief Complaint  Patient  presents with   Abdominal Pain      HPI:    Marilyn Rivera  is a 42 y.o. female with past medical history relevant for morbid obesity, DM2, diverticulosis with prior episodes of diverticulitis, and prior partial colectomy, iron deficient anemia in the setting of menorrhagia who presents to the ED with abdominal pain that started p.m. of 11/14/2022 after dinner -Patient reports constipation... With stool pebbles on 11/14/2022 No fever  Or chills   No Nausea, Vomiting or Diarrhea -UA is not consistent with a UTI, elevated specific gravity noted -WBC 13.7, hemoglobin 10.7, platelets 274 -Creatinine 1.0, LFTs are not elevated, sodium 136 potassium 3.5 -Lactic acid and lipase is not elevated CT abdomen and Pelvis shows:--  IMPRESSION: 1. There is mild fat stranding surrounding the transverse colon and descending colon without significant wall thickening. Findings favor early/subacute colitis. 2. Liquid unformed  stool in the majority of the colon, nonspecific but commonly seen with diarrhea. Diverticulosis without superimposed acute diverticulitis.   Review of systems:    In addition to the HPI above,   A full Review of  Systems was done, all other systems reviewed are negative except as noted above in HPI , .    Social History:  Reviewed by me    Social History   Tobacco Use   Smoking status: Never   Smokeless tobacco: Never  Substance Use Topics   Alcohol use: No       Family History :  Reviewed by me    Family History  Problem Relation Age of Onset   Heart failure Mother    Hypertension Mother    Heart disease Mother    Cancer Other        mother's side of family, breast cancer   Cancer Other        father's side of family, leukemia   Hypertension Father    Stroke Father 89   Diabetes Other    Hypertension Other    Colon cancer Cousin    Healthy Brother    Breast cancer Maternal Aunt 29   Leukemia Paternal Aunt    Kidney disease Maternal Grandmother    Hypertension Maternal Grandmother    Cancer Maternal Grandfather    Leukemia Paternal Grandmother    Cancer Paternal Grandfather     Home Medications:   Prior to Admission medications   Medication Sig Start Date End Date Taking? Authorizing Provider  amLODipine (NORVASC) 5 MG tablet Take 1 tablet (5 mg total) by mouth daily. 09/10/22  Yes Gottschalk, Kathie Rhodes M, DO  cyclobenzaprine (FLEXERIL) 5 MG tablet Take 1 tablet (5 mg total) by mouth 3 (three) times daily as needed for muscle spasms. 10/07/22  Yes Delynn Flavin M, DO  diclofenac (VOLTAREN) 75 MG EC tablet Take 1 tablet (75 mg total) by mouth 2 (two) times daily. 10/15/22  Yes Gottschalk, Kathie Rhodes M, DO  EPINEPHrine 0.3 mg/0.3 mL IJ SOAJ injection Inject 0.3 mg into the muscle as needed for anaphylaxis (severe allergic reaction, then call 911). 05/01/21  Yes Gottschalk, Kathie Rhodes M, DO  ibuprofen (ADVIL) 200 MG tablet Take 1,600 mg by mouth every 6 (six) hours as needed  for moderate pain.   Yes [provider]  linaclotide (LINZESS) 145 MCG CAPS capsule Take 1 capsule (145 mcg total) by mouth daily before breakfast. Patient taking differently: Take 145 mcg by mouth daily as needed (Constipation). 09/10/22  Yes Gottschalk, Ashly M, DO  montelukast (SINGULAIR) 10 MG tablet Take 1 tablet (10 mg total)  by mouth at bedtime. 09/10/22  Yes Gottschalk, Kathie Rhodes M, DO  omeprazole (PRILOSEC) 40 MG capsule Take 1 capsule (40 mg total) by mouth daily. 10/15/22  Yes Gottschalk, Ashly M, DO  ondansetron (ZOFRAN) 4 MG tablet TAKE ONE TABLET EVERY 8 HOURS AS NEEDED FOR NAUSEA AND VOMITING 10/07/22  Yes Delynn Flavin M, DO  sulfamethoxazole-trimethoprim (BACTRIM DS) 800-160 MG tablet Take 1 tablet by mouth 2 (two) times daily. X7 days per HS flareup. 09/10/22  Yes Gottschalk, Kathie Rhodes M, DO  tirzepatide [facility]) 15 MG/0.5ML Pen INJECT 15 MG SUBCUTANEOUSLY ONCE PER WEEK 10/21/22  Yes Delynn Flavin M, DO  glucose blood test strip UAD to check sugar twice daily E11.649 (using accuchek) 01/01/21   [name], DO  Lancet Device MISC UAD to check sugar twice daily E11.649 (using Accucheck machine) 01/01/21   [name], DO     Allergies:     Allergies  Allergen Reactions   Keflex [Cephalexin]     Yeast infections    Coconut Fatty Acid Itching and Rash     Physical Exam:   Vitals  Blood pressure (!) 164/80, pulse (!) 102, temperature 98.7 F (37.1 C), temperature source Oral, resp. rate 19, height 5\' 10"  (1.778 m), weight (!) 158.8 kg, last menstrual period 11/01/2022, SpO2 100%.  Physical Examination: General appearance - alert,  in no distress  Mental status - alert, oriented to person, place, and time,  Eyes - sclera anicteric Neck - supple, no JVD elevation , Chest - clear  to auscultation bilaterally, symmetrical air movement,  Heart - S1 and S2 normal, regular  Abdomen - soft,  nondistended, +BS, left lower quadrant and suprapubic area  discomfort without rebound or guarding, no CVA area tenderness, increased truncal adiposity noted Neurological - screening mental status exam normal, neck supple without rigidity, cranial nerves II through XII intact, DTR's normal and symmetric Extremities - no pedal edema noted, intact peripheral pulses  Skin - warm, dry     Data Review:    CBC Recent Labs  Lab 11/15/22 0952  WBC 13.7*  HGB 10.7*  HCT 34.3*  PLT 274  MCV 78.5*  MCH 24.5*  MCHC 31.2  RDW 16.8*  LYMPHSABS 1.3  MONOABS 1.2*  EOSABS 0.0  BASOSABS 0.0   ------------------------------------------------------------------------------------------------------------------  Chemistries  Recent Labs  Lab 11/15/22 0952  NA 136  K 3.5  CL 104  CO2 20*  GLUCOSE 113*  BUN 13  CREATININE 1.03*  CALCIUM 8.6*  AST 24  ALT 17  ALKPHOS 58  BILITOT 0.9   ------------------------------------------------------------------------------------------------------------------ estimated creatinine clearance is 117.5 mL/min (A) (by C-G formula based on SCr of 1.03 mg/dL (H)). ---------------------------------------------------------------------------------------------------------------  Urinalysis    Component Value Date/Time   COLORURINE AMBER (A) 11/15/2022 1336   APPEARANCEUR CLEAR 11/15/2022 1336   APPEARANCEUR Clear 12/19/2020 1019   LABSPEC >1.046 (H) 11/15/2022 1336   PHURINE 5.0 11/15/2022 1336   GLUCOSEU NEGATIVE 11/15/2022 1336   HGBUR NEGATIVE 11/15/2022 1336   BILIRUBINUR NEGATIVE 11/15/2022 1336   BILIRUBINUR Negative 12/19/2020 1019   KETONESUR 20 (A) 11/15/2022 1336   PROTEINUR 30 (A) 11/15/2022 1336   UROBILINOGEN 0.2 10/05/2014 0028   NITRITE NEGATIVE 11/15/2022 1336   LEUKOCYTESUR NEGATIVE 11/15/2022 1336  -------------------------------------------------------------------   Imaging Results:    CT ABDOMEN PELVIS W CONTRAST  Result Date: 11/15/2022 CLINICAL DATA:  LLQ abdominal pain. EXAM: CT  ABDOMEN AND PELVIS WITH CONTRAST TECHNIQUE: Multidetector CT imaging of the abdomen and pelvis was performed using the standard  protocol following bolus administration of intravenous contrast. RADIATION DOSE REDUCTION: This exam was performed according to the departmental dose-optimization program which includes automated exposure control, adjustment of the mA and/or kV according to patient size and/or use of iterative reconstruction technique. CONTRAST:  OMNIPAQUE IOHEXOL 300 MG/ML  SOLN COMPARISON:  CT scan abdomen and pelvis from 06/26/2022. FINDINGS: Lower chest: There are dependent atelectatic changes in the visualized lung bases. No overt consolidation. No pleural effusion. The heart is normal in size. No pericardial effusion. Hepatobiliary: The liver is normal in size. Non-cirrhotic configuration. No suspicious mass. No intrahepatic or extrahepatic bile duct dilation. No calcified gallstones. Normal gallbladder wall thickness. No pericholecystic inflammatory changes. Pancreas: Unremarkable. No pancreatic ductal dilatation or surrounding inflammatory changes. Spleen: Within normal limits. No focal lesion. Adrenals/Urinary Tract: Adrenal glands are unremarkable. No suspicious renal mass. No hydronephrosis. No renal or ureteric calculi. Unremarkable urinary bladder. Stomach/Bowel: No disproportionate dilation of the small or large bowel loops. Redemonstration of slightly prominent appendix measuring up to 8 mm in the base with globular dilation of the apex measuring up to 13 mm. No surrounding fat stranding or appendicoliths. This appearance is essentially unchanged since the prior study. There are scattered diverticula in the descending colon. Patient is status post colocolonic anastomosis in the left lower quadrant. There is liquid unformed stool in the rest of the colon. There is mild fat stranding surrounding the transverse colon and descending colon without significant wall thickening. Findings favor  early/subacute colitis. Vascular/Lymphatic: No ascites or pneumoperitoneum. No abdominal or pelvic lymphadenopathy, by size criteria. No aneurysmal dilation of the major abdominal arteries. Reproductive: Slightly globular appearing uterus is incompletely characterized on the current examination but appears grossly unchanged since the prior study. No large adnexal mass. Other: Periumbilical focal scar and tiny fat containing hernia noted. The soft tissues and abdominal wall are otherwise unremarkable. Musculoskeletal: No suspicious osseous lesions. There are mild multilevel degenerative changes in the visualized spine. IMPRESSION: 1. There is mild fat stranding surrounding the transverse colon and descending colon without significant wall thickening. Findings favor early/subacute colitis. 2. Liquid unformed stool in the majority of the colon, nonspecific but commonly seen with diarrhea. Diverticulosis without superimposed acute diverticulitis. 3. Multiple other nonacute observations, as described above. Electronically Signed   By: Jules Schick M.D.   On: 11/15/2022 13:36    Radiological Exams on Admission: CT ABDOMEN PELVIS W CONTRAST  Result Date: 11/15/2022 CLINICAL DATA:  LLQ abdominal pain. EXAM: CT ABDOMEN AND PELVIS WITH CONTRAST TECHNIQUE: Multidetector CT imaging of the abdomen and pelvis was performed using the standard protocol following bolus administration of intravenous contrast. RADIATION DOSE REDUCTION: This exam was performed according to the departmental dose-optimization program which includes automated exposure control, adjustment of the mA and/or kV according to patient size and/or use of iterative reconstruction technique. CONTRAST:  OMNIPAQUE IOHEXOL 300 MG/ML  SOLN COMPARISON:  CT scan abdomen and pelvis from 06/26/2022. FINDINGS: Lower chest: There are dependent atelectatic changes in the visualized lung bases. No overt consolidation. No pleural effusion. The heart is normal in  size. No pericardial effusion. Hepatobiliary: The liver is normal in size. Non-cirrhotic configuration. No suspicious mass. No intrahepatic or extrahepatic bile duct dilation. No calcified gallstones. Normal gallbladder wall thickness. No pericholecystic inflammatory changes. Pancreas: Unremarkable. No pancreatic ductal dilatation or surrounding inflammatory changes. Spleen: Within normal limits. No focal lesion. Adrenals/Urinary Tract: Adrenal glands are unremarkable. No suspicious renal mass. No hydronephrosis. No renal or ureteric calculi. Unremarkable urinary bladder. Stomach/Bowel: No disproportionate  dilation of the small or large bowel loops. Redemonstration of slightly prominent appendix measuring up to 8 mm in the base with globular dilation of the apex measuring up to 13 mm. No surrounding fat stranding or appendicoliths. This appearance is essentially unchanged since the prior study. There are scattered diverticula in the descending colon. Patient is status post colocolonic anastomosis in the left lower quadrant. There is liquid unformed stool in the rest of the colon. There is mild fat stranding surrounding the transverse colon and descending colon without significant wall thickening. Findings favor early/subacute colitis. Vascular/Lymphatic: No ascites or pneumoperitoneum. No abdominal or pelvic lymphadenopathy, by size criteria. No aneurysmal dilation of the major abdominal arteries. Reproductive: Slightly globular appearing uterus is incompletely characterized on the current examination but appears grossly unchanged since the prior study. No large adnexal mass. Other: Periumbilical focal scar and tiny fat containing hernia noted. The soft tissues and abdominal wall are otherwise unremarkable. Musculoskeletal: No suspicious osseous lesions. There are mild multilevel degenerative changes in the visualized spine. IMPRESSION: 1. There is mild fat stranding surrounding the transverse colon and descending  colon without significant wall thickening. Findings favor early/subacute colitis. 2. Liquid unformed stool in the majority of the colon, nonspecific but commonly seen with diarrhea. Diverticulosis without superimposed acute diverticulitis. 3. Multiple other nonacute observations, as described above. Electronically Signed   By: Jules Schick M.D.   On: 11/15/2022 13:36    DVT Prophylaxis -SCD /heparin AM Labs Ordered, also please review Full Orders  Family Communication: Admission, patients condition and plan of care including tests being ordered have been discussed with the patient who indicate understanding and agree with the plan   Condition  -stable  Shon Hale M.D on 11/15/2022 at 7:12 PM Go to [email or web address] -  for contact info  [facility] - Office  [phone]

## 2022-11-15 NOTE — ED Notes (Signed)
Had to assist pt getting out of car, pt is having lower abdominal pain. Has been going on since midnight. Aching and stabbing pains.

## 2022-11-15 NOTE — ED Provider Notes (Signed)
[place] EMERGENCY DEPARTMENT AT [facility] Provider Note   CSN: [id number] Arrival date & time: 11/15/22  0800     History  Chief Complaint  Patient presents with   Abdominal Pain    Marilyn Rivera is a 42 y.o. female.  He has PMH of GERD, diverticulitis, and morbid obesity on Ozempic presents to the ER with left lower quadrant pain that started suddenly last night eating a chicken taco, states the pain started immediately, no symptoms prior to this.  Denies fever or chills, has been having vomiting all morning.  Pain is waxing and waning but overall constant and 10 out of 10 at the worst.  Denies dysuria.  Abdominal Pain      Home Medications Prior to Admission medications   Medication Sig Start Date End Date Taking? Authorizing Provider  amLODipine (NORVASC) 5 MG tablet Take 1 tablet (5 mg total) by mouth daily. 09/10/22  Yes Gottschalk, Kathie Rhodes M, DO  cyclobenzaprine (FLEXERIL) 5 MG tablet Take 1 tablet (5 mg total) by mouth 3 (three) times daily as needed for muscle spasms. 10/07/22  Yes Delynn Flavin M, DO  diclofenac (VOLTAREN) 75 MG EC tablet Take 1 tablet (75 mg total) by mouth 2 (two) times daily. 10/15/22  Yes Gottschalk, Kathie Rhodes M, DO  EPINEPHrine 0.3 mg/0.3 mL IJ SOAJ injection Inject 0.3 mg into the muscle as needed for anaphylaxis (severe allergic reaction, then call 911). 05/01/21  Yes Gottschalk, Kathie Rhodes M, DO  ibuprofen (ADVIL) 200 MG tablet Take 1,600 mg by mouth every 6 (six) hours as needed for moderate pain.   Yes [provider]  linaclotide (LINZESS) 145 MCG CAPS capsule Take 1 capsule (145 mcg total) by mouth daily before breakfast. Patient taking differently: Take 145 mcg by mouth daily as needed (Constipation). 09/10/22  Yes Gottschalk, Ashly M, DO  montelukast (SINGULAIR) 10 MG tablet Take 1 tablet (10 mg total) by mouth at bedtime. 09/10/22  Yes Gottschalk, Kathie Rhodes M, DO  omeprazole (PRILOSEC) 40 MG capsule Take 1 capsule (40 mg total)  by mouth daily. 10/15/22  Yes Gottschalk, Ashly M, DO  ondansetron (ZOFRAN) 4 MG tablet TAKE ONE TABLET EVERY 8 HOURS AS NEEDED FOR NAUSEA AND VOMITING 10/07/22  Yes Delynn Flavin M, DO  sulfamethoxazole-trimethoprim (BACTRIM DS) 800-160 MG tablet Take 1 tablet by mouth 2 (two) times daily. X7 days per HS flareup. 09/10/22  Yes Gottschalk, Kathie Rhodes M, DO  tirzepatide [facility]) 15 MG/0.5ML Pen INJECT 15 MG SUBCUTANEOUSLY ONCE PER WEEK 10/21/22  Yes Delynn Flavin M, DO  glucose blood test strip UAD to check sugar twice daily E11.649 (using accuchek) 01/01/21   [name], DO  Lancet Device MISC UAD to check sugar twice daily E11.649 (using Accucheck machine) 01/01/21   [name], DO      Allergies    Keflex [cephalexin] and Coconut fatty acid    Review of Systems   Review of Systems  Gastrointestinal:  Positive for abdominal pain.    Physical Exam Updated Vital Signs BP (!) 164/80   Pulse (!) 102   Temp 98.7 F (37.1 C) (Oral)   Resp 19   Ht 5\' 10"  (1.778 m)   Wt (!) 158.8 kg   LMP 11/01/2022   SpO2 100%   BMI 50.22 kg/m  Physical Exam Vitals and nursing note reviewed.  Constitutional:      Appearance: She is well-developed. She is obese.     Comments: In obvious pain   HENT:  Head: Normocephalic and atraumatic.  Eyes:     Conjunctiva/sclera: Conjunctivae normal.  Cardiovascular:     Rate and Rhythm: Normal rate and regular rhythm.     Heart sounds: No murmur heard. Pulmonary:     Effort: Pulmonary effort is normal. No respiratory distress.     Breath sounds: Normal breath sounds.  Abdominal:     Palpations: Abdomen is soft.     Tenderness: There is abdominal tenderness in the left lower quadrant.  Musculoskeletal:        General: No swelling.     Cervical back: Neck supple.  Skin:    General: Skin is warm and dry.     Capillary Refill: Capillary refill takes less than 2 seconds.  Neurological:     Mental Status: She is alert.  Psychiatric:         Mood and Affect: Mood normal.     ED Results / Procedures / Treatments   Labs (all labs ordered are listed, but only abnormal results are displayed) Labs Reviewed  CBC WITH DIFFERENTIAL/PLATELET - Abnormal; Notable for the following components:      Result Value   WBC 13.7 (*)    Hemoglobin 10.7 (*)    HCT 34.3 (*)    MCV 78.5 (*)    MCH 24.5 (*)    RDW 16.8 (*)    Neutro Abs 11.2 (*)    Monocytes Absolute 1.2 (*)    All other components within normal limits  COMPREHENSIVE METABOLIC PANEL - Abnormal; Notable for the following components:   CO2 20 (*)    Glucose, Bld 113 (*)    Creatinine, Ser 1.03 (*)    Calcium 8.6 (*)    All other components within normal limits  URINALYSIS, ROUTINE W REFLEX MICROSCOPIC - Abnormal; Notable for the following components:   Color, Urine AMBER (*)    Specific Gravity, Urine >1.046 (*)    Ketones, ur 20 (*)    Protein, ur 30 (*)    All other components within normal limits  GASTROINTESTINAL PANEL BY PCR, STOOL (REPLACES STOOL CULTURE)  C DIFFICILE QUICK SCREEN W PCR REFLEX    LIPASE, BLOOD  LACTIC ACID, PLASMA  HCG, QUANTITATIVE, PREGNANCY  HIV ANTIBODY (ROUTINE TESTING W REFLEX)  BASIC METABOLIC PANEL  CBC    EKG None  Radiology CT ABDOMEN PELVIS W CONTRAST  Result Date: 11/15/2022 CLINICAL DATA:  LLQ abdominal pain. EXAM: CT ABDOMEN AND PELVIS WITH CONTRAST TECHNIQUE: Multidetector CT imaging of the abdomen and pelvis was performed using the standard protocol following bolus administration of intravenous contrast. RADIATION DOSE REDUCTION: This exam was performed according to the departmental dose-optimization program which includes automated exposure control, adjustment of the mA and/or kV according to patient size and/or use of iterative reconstruction technique. CONTRAST:  OMNIPAQUE IOHEXOL 300 MG/ML  SOLN COMPARISON:  CT scan abdomen and pelvis from 06/26/2022. FINDINGS: Lower chest: There are dependent atelectatic  changes in the visualized lung bases. No overt consolidation. No pleural effusion. The heart is normal in size. No pericardial effusion. Hepatobiliary: The liver is normal in size. Non-cirrhotic configuration. No suspicious mass. No intrahepatic or extrahepatic bile duct dilation. No calcified gallstones. Normal gallbladder wall thickness. No pericholecystic inflammatory changes. Pancreas: Unremarkable. No pancreatic ductal dilatation or surrounding inflammatory changes. Spleen: Within normal limits. No focal lesion. Adrenals/Urinary Tract: Adrenal glands are unremarkable. No suspicious renal mass. No hydronephrosis. No renal or ureteric calculi. Unremarkable urinary bladder. Stomach/Bowel: No disproportionate dilation of the small or large bowel  loops. Redemonstration of slightly prominent appendix measuring up to 8 mm in the base with globular dilation of the apex measuring up to 13 mm. No surrounding fat stranding or appendicoliths. This appearance is essentially unchanged since the prior study. There are scattered diverticula in the descending colon. Patient is status post colocolonic anastomosis in the left lower quadrant. There is liquid unformed stool in the rest of the colon. There is mild fat stranding surrounding the transverse colon and descending colon without significant wall thickening. Findings favor early/subacute colitis. Vascular/Lymphatic: No ascites or pneumoperitoneum. No abdominal or pelvic lymphadenopathy, by size criteria. No aneurysmal dilation of the major abdominal arteries. Reproductive: Slightly globular appearing uterus is incompletely characterized on the current examination but appears grossly unchanged since the prior study. No large adnexal mass. Other: Periumbilical focal scar and tiny fat containing hernia noted. The soft tissues and abdominal wall are otherwise unremarkable. Musculoskeletal: No suspicious osseous lesions. There are mild multilevel degenerative changes in the  visualized spine. IMPRESSION: 1. There is mild fat stranding surrounding the transverse colon and descending colon without significant wall thickening. Findings favor early/subacute colitis. 2. Liquid unformed stool in the majority of the colon, nonspecific but commonly seen with diarrhea. Diverticulosis without superimposed acute diverticulitis. 3. Multiple other nonacute observations, as described above. Electronically Signed   By: Jules Schick M.D.   On: 11/15/2022 13:36    Procedures Ultrasound ED Peripheral IV (Provider)  Date/Time: 11/15/2022 11:33 AM  Performed by: Ma Rings, PA-C Authorized by: Ma Rings, PA-C   Procedure details:    Indications: poor IV access     Skin Prep: chlorhexidine gluconate     Location: medial aspect right upper arm.   Angiocath:  20 G   Bedside Ultrasound Guided: Yes     Images: not archived     Patient tolerated procedure without complications: Yes     Dressing applied: Yes       Medications Ordered in ED Medications  hydrALAZINE (APRESOLINE) injection 10 mg (has no administration in time range)  fentaNYL (SUBLIMAZE) injection 50 mcg (50 mcg Intravenous Given 11/15/22 1706)  oxyCODONE (Oxy IR/ROXICODONE) immediate release tablet 5 mg (5 mg Oral Given 11/15/22 1510)  senna-docusate (Senokot-S) tablet 2 tablet (has no administration in time range)  polyethylene glycol (MIRALAX / GLYCOLAX) packet 17 g (has no administration in time range)  0.9 %  sodium chloride infusion ( Intravenous New Bag/Given 11/15/22 1512)  montelukast (SINGULAIR) tablet 10 mg (has no administration in time range)  pantoprazole (PROTONIX) EC tablet 40 mg (has no administration in time range)  amLODipine (NORVASC) tablet 5 mg (has no administration in time range)  Ampicillin-Sulbactam (UNASYN) 3 g in sodium chloride 0.9 % 100 mL IVPB (has no administration in time range)  sodium chloride flush (NS) 0.9 % injection 3 mL (has no administration in time range)   sodium chloride flush (NS) 0.9 % injection 3 mL (has no administration in time range)  sodium chloride flush (NS) 0.9 % injection 3 mL (has no administration in time range)  0.9 %  sodium chloride infusion (has no administration in time range)  acetaminophen (TYLENOL) tablet 650 mg (has no administration in time range)    Or  acetaminophen (TYLENOL) suppository 650 mg (has no administration in time range)  traZODone (DESYREL) tablet 50 mg (has no administration in time range)  ondansetron (ZOFRAN) tablet 4 mg (has no administration in time range)    Or  ondansetron (ZOFRAN) injection 4 mg (has no administration  in time range)  heparin injection 5,000 Units (has no administration in time range)  HYDROmorphone (DILAUDID) injection 1 mg (1 mg Intramuscular Given 11/15/22 1025)  ondansetron (ZOFRAN) injection 4 mg (4 mg Intravenous Given 11/15/22 1115)  HYDROmorphone (DILAUDID) injection 1 mg (1 mg Intravenous Given 11/15/22 1116)  lactated ringers bolus 1,000 mL (0 mLs Intravenous Stopped 11/15/22 1405)  iohexol (OMNIPAQUE) 300 MG/ML solution 100 mL (100 mLs Intravenous Contrast Given 11/15/22 1205)  Ampicillin-Sulbactam (UNASYN) 3 g in sodium chloride 0.9 % 100 mL IVPB (0 g Intravenous Stopped 11/15/22 1438)  morphine (PF) 4 MG/ML injection 4 mg (4 mg Intravenous Given 11/15/22 1356)  lactated ringers bolus 1,000 mL (0 mLs Intravenous Stopped 11/15/22 1513)  bisacodyl (DULCOLAX) suppository 10 mg (10 mg Rectal Given 11/15/22 1851)  lactulose (CHRONULAC) 10 GM/15ML solution 30 g (30 g Oral Given 11/15/22 1513)  prochlorperazine (COMPAZINE) suppository 25 mg (25 mg Rectal Given 11/15/22 1719)    ED Course/ Medical Decision Making/ A&P Clinical Course as of 11/15/22 1922  Fri Nov 15, 2022  1136 Impression and plan: Patient presents with severe left lower quadrant pain that feels similar to prior diverticulitis.  Her body habitus makes it difficult to assess for rebound guarding and rigidity.  Patient  has poor IV access ultrasound IV obtained, patient given Dilaudid IM initially due to lack of IV access, she had some mild relief but did require second dose of IV pain medication and Zofran, will give IV fluids, wait for labs and plan on CT scan. [CB]    Clinical Course User Index [CB] Ma Rings, PA-C                                 Medical Decision Making This patient presents to the ED for concern of left lower quadrant abdominal pain that is severe since last night, this involves an extensive number of treatment options, and is a complaint that carries with it a high risk of complications and morbidity.  The differential diagnosis includes otitis, colitis, intra-abdominal abscess, perforation, UTI, dehydration, gastroenteritis, other   Co morbidities that complicate the patient evaluation :   History of diverticulitis requiring surgery   Additional history obtained:  Additional history obtained from EMR External records from outside source obtained and reviewed including notes   Lab Tests:  I Ordered, and personally interpreted labs.  The pertinent results include: Patient has leukocytosis with white blood cell count 13.7, pregnancy negative, lactic acid 1.7, lipase normal, mildly elevated creatinine, hemoglobin 10.7 which seems to be around her baseline   Imaging Studies ordered:  I ordered imaging studies including the abdomen pelvis which shows no free air, no diverticulitis, mild stranding around the colon suggestive of colitis. I independently visualized and interpreted imaging within scope of identifying emergent findings  I agree with the radiologist interpretation   Cardiac Monitoring: / EKG:  The patient was maintained on a cardiac monitor.  I personally viewed and interpreted the cardiac monitored which showed an underlying rhythm of: Sinus tachycardia   Consultations Obtained:  I requested consultation with the last,  and discussed lab and imaging  findings as well as pertinent plan - they recommend: See patient for admission   Problem List / ED Course / Critical interventions / Medication management  Patient having severe left lower quadrant abdominal pain, history of diverticulitis requiring surgery.  She has required multiple doses of IV pain medication.  She has  been very tearful, labs show mild leukocytosis.  Urine is concentrated in conjunction with likely dehydration from her repeated vomiting.  She required ultrasound IV, veins noted to be easily collapsible also suggesting she has dehydration.  Given IV fluids, pain medicine, CT shows likely colitis.  Given her repeated need for pain medicine I feel she will need admission.  Discussed with her admission versus discharge and she also feels she needs to stay in the hospital I ordered medication including dilaudid for pain  Reevaluation of the patient after these medicines showed that the patient improved I have reviewed the patients home medicines and have made adjustments as needed       Amount and/or Complexity of Data Reviewed External Data Reviewed: labs and notes. Labs: ordered. Radiology: ordered and independent interpretation performed.  Risk Prescription drug management. Decision regarding hospitalization.           Final Clinical Impression(s) / ED Diagnoses Final diagnoses:  Colitis    Rx / DC Orders ED Discharge Orders     None         Josem Kaufmann 11/15/22 Jeri Lager, MD 11/21/22 1827

## 2022-11-15 NOTE — ED Triage Notes (Signed)
Pt stated after dinner she got severe abdominal pain. Pt has a history of diverticulitis and has been on monjaro for 1.5 years.

## 2022-11-16 DIAGNOSIS — E860 Dehydration: Secondary | ICD-10-CM | POA: Diagnosis present

## 2022-11-16 DIAGNOSIS — E538 Deficiency of other specified B group vitamins: Secondary | ICD-10-CM | POA: Diagnosis present

## 2022-11-16 DIAGNOSIS — K529 Noninfective gastroenteritis and colitis, unspecified: Secondary | ICD-10-CM | POA: Diagnosis present

## 2022-11-16 DIAGNOSIS — Z881 Allergy status to other antibiotic agents status: Secondary | ICD-10-CM | POA: Diagnosis not present

## 2022-11-16 DIAGNOSIS — M1990 Unspecified osteoarthritis, unspecified site: Secondary | ICD-10-CM | POA: Diagnosis present

## 2022-11-16 DIAGNOSIS — Z79899 Other long term (current) drug therapy: Secondary | ICD-10-CM | POA: Diagnosis not present

## 2022-11-16 DIAGNOSIS — Z8619 Personal history of other infectious and parasitic diseases: Secondary | ICD-10-CM | POA: Diagnosis not present

## 2022-11-16 DIAGNOSIS — Z833 Family history of diabetes mellitus: Secondary | ICD-10-CM | POA: Diagnosis not present

## 2022-11-16 DIAGNOSIS — D509 Iron deficiency anemia, unspecified: Secondary | ICD-10-CM | POA: Diagnosis present

## 2022-11-16 DIAGNOSIS — Z87442 Personal history of urinary calculi: Secondary | ICD-10-CM | POA: Diagnosis not present

## 2022-11-16 DIAGNOSIS — Z9049 Acquired absence of other specified parts of digestive tract: Secondary | ICD-10-CM | POA: Diagnosis not present

## 2022-11-16 DIAGNOSIS — K219 Gastro-esophageal reflux disease without esophagitis: Secondary | ICD-10-CM | POA: Diagnosis present

## 2022-11-16 DIAGNOSIS — Z6841 Body Mass Index (BMI) 40.0 and over, adult: Secondary | ICD-10-CM | POA: Diagnosis not present

## 2022-11-16 DIAGNOSIS — E1169 Type 2 diabetes mellitus with other specified complication: Secondary | ICD-10-CM | POA: Diagnosis present

## 2022-11-16 DIAGNOSIS — Z23 Encounter for immunization: Secondary | ICD-10-CM | POA: Diagnosis not present

## 2022-11-16 DIAGNOSIS — K5900 Constipation, unspecified: Secondary | ICD-10-CM | POA: Diagnosis present

## 2022-11-16 DIAGNOSIS — R1032 Left lower quadrant pain: Secondary | ICD-10-CM | POA: Diagnosis present

## 2022-11-16 DIAGNOSIS — N920 Excessive and frequent menstruation with regular cycle: Secondary | ICD-10-CM | POA: Diagnosis present

## 2022-11-16 DIAGNOSIS — Z7985 Long-term (current) use of injectable non-insulin antidiabetic drugs: Secondary | ICD-10-CM | POA: Diagnosis not present

## 2022-11-16 DIAGNOSIS — Z8249 Family history of ischemic heart disease and other diseases of the circulatory system: Secondary | ICD-10-CM | POA: Diagnosis not present

## 2022-11-16 DIAGNOSIS — E876 Hypokalemia: Secondary | ICD-10-CM | POA: Diagnosis present

## 2022-11-16 DIAGNOSIS — Z91018 Allergy to other foods: Secondary | ICD-10-CM | POA: Diagnosis not present

## 2022-11-16 DIAGNOSIS — I152 Hypertension secondary to endocrine disorders: Secondary | ICD-10-CM | POA: Diagnosis present

## 2022-11-16 LAB — C DIFFICILE QUICK SCREEN W PCR REFLEX
C Diff antigen: NEGATIVE
C Diff interpretation: NOT DETECTED
C Diff toxin: NEGATIVE

## 2022-11-16 LAB — CBC
HCT: 35 % — ABNORMAL LOW (ref 36.0–46.0)
Hemoglobin: 10.7 g/dL — ABNORMAL LOW (ref 12.0–15.0)
MCH: 24.4 pg — ABNORMAL LOW (ref 26.0–34.0)
MCHC: 30.6 g/dL (ref 30.0–36.0)
MCV: 79.9 fL — ABNORMAL LOW (ref 80.0–100.0)
Platelets: 205 10*3/uL (ref 150–400)
RBC: 4.38 MIL/uL (ref 3.87–5.11)
RDW: 17.4 % — ABNORMAL HIGH (ref 11.5–15.5)
WBC: 17 10*3/uL — ABNORMAL HIGH (ref 4.0–10.5)
nRBC: 0 % (ref 0.0–0.2)

## 2022-11-16 LAB — BASIC METABOLIC PANEL
Anion gap: 8 (ref 5–15)
BUN: 10 mg/dL (ref 6–20)
CO2: 23 mmol/L (ref 22–32)
Calcium: 8.1 mg/dL — ABNORMAL LOW (ref 8.9–10.3)
Chloride: 105 mmol/L (ref 98–111)
Creatinine, Ser: 1 mg/dL (ref 0.44–1.00)
GFR, Estimated: >60 mL/min (ref >60)
Glucose, Bld: 105 mg/dL — ABNORMAL HIGH (ref 70–99)
Potassium: 3.1 mmol/L — ABNORMAL LOW (ref 3.5–5.1)
Sodium: 136 mmol/L (ref 135–145)

## 2022-11-16 LAB — HIV ANTIBODY (ROUTINE TESTING W REFLEX): HIV Screen 4th Generation wRfx: NONREACTIVE

## 2022-11-16 MED ORDER — LACTULOSE 10 GM/15ML PO SOLN
30.0000 g | Freq: Once | ORAL | Status: AC
  Administered 2022-11-16: 30 g via ORAL
  Filled 2022-11-16: qty 60

## 2022-11-16 MED ORDER — POTASSIUM CHLORIDE CRYS ER 20 MEQ PO TBCR
40.0000 meq | EXTENDED_RELEASE_TABLET | ORAL | Status: AC
  Administered 2022-11-16 (×2): 40 meq via ORAL
  Filled 2022-11-16 (×2): qty 2

## 2022-11-16 MED ORDER — LINACLOTIDE 145 MCG PO CAPS
290.0000 ug | ORAL_CAPSULE | Freq: Every day | ORAL | Status: AC
  Administered 2022-11-16 – 2022-11-17 (×2): 290 ug via ORAL
  Filled 2022-11-16 (×3): qty 2

## 2022-11-16 MED ORDER — HYDROMORPHONE HCL 1 MG/ML IJ SOLN
0.5000 mg | Freq: Once | INTRAMUSCULAR | Status: AC | PRN
  Administered 2022-11-16: 0.5 mg via INTRAVENOUS
  Filled 2022-11-16: qty 0.5

## 2022-11-16 MED ORDER — POLYETHYLENE GLYCOL 3350 17 G PO PACK
17.0000 g | PACK | Freq: Two times a day (BID) | ORAL | Status: DC
  Administered 2022-11-17 – 2022-11-19 (×2): 17 g via ORAL
  Filled 2022-11-16 (×5): qty 1

## 2022-11-16 MED ORDER — AMLODIPINE BESYLATE 5 MG PO TABS
10.0000 mg | ORAL_TABLET | Freq: Every day | ORAL | Status: DC
  Administered 2022-11-17 – 2022-11-19 (×3): 10 mg via ORAL
  Filled 2022-11-16 (×3): qty 2

## 2022-11-16 MED ORDER — FENTANYL CITRATE PF 50 MCG/ML IJ SOSY
50.0000 ug | PREFILLED_SYRINGE | INTRAMUSCULAR | Status: DC | PRN
  Administered 2022-11-16 – 2022-11-19 (×10): 50 ug via INTRAVENOUS
  Filled 2022-11-16 (×10): qty 1

## 2022-11-16 NOTE — Progress Notes (Signed)
Patient reported pain level 3/10 this morning after giving one time dose of dilaudid. Patient reported none of the previous pain medications have helped with pain.Noted patients WBC was 13.7 yesterday now 17. MD Courage made aware. New orders placed.

## 2022-11-16 NOTE — Progress Notes (Signed)
Patient has been experiencing 10 out of 10 pain throughout the night despite receiving fentanyl and oxycodone several times. She is shaking, moaning, groaning, and grimacing. Pulse 118 and BP 172/97 (118) at 0500. WBC is 17 up from 13.7 yesterday. MD made aware.

## 2022-11-16 NOTE — Progress Notes (Signed)
Patient received alternating Fentanyl and Oxycodone with little relief of pain. Pain stayed between 8 and 10 out of 0-10 pain scale.

## 2022-11-16 NOTE — Progress Notes (Signed)
   11/16/22 0500  Assess: MEWS Score  Temp (!) 97.3 F (36.3 C)  BP (!) 172/97  MAP (mmHg) 118  Pulse Rate (!) 118  SpO2 93 %  Assess: MEWS Score  MEWS Temp 0  MEWS Systolic 0  MEWS Pulse 2  MEWS RR 0  MEWS LOC 0  MEWS Score 2  MEWS Score Color Yellow  Assess: if the MEWS score is Yellow or Red  Were vital signs accurate and taken at a resting state? Yes  Does the patient meet 2 or more of the SIRS criteria? Yes  Does the patient have a confirmed or suspected source of infection? Yes  MEWS guidelines implemented  Yes, yellow  Treat  MEWS Interventions Considered administering scheduled or prn medications/treatments as ordered  Take Vital Signs  Increase Vital Sign Frequency  Yellow: Q2hr x1, continue Q4hrs until patient remains green for 12hrs  Escalate  MEWS: Escalate Yellow: Discuss with charge nurse and consider notifying provider and/or RRT  Notify: Charge Nurse/RN  Name of Charge Nurse/RN Notified [place], RN  Provider Notification  Provider Name/Title Adefeso, DO  Assess: SIRS CRITERIA  SIRS Temperature  0  SIRS Pulse 1  SIRS Respirations  0  SIRS WBC 0  SIRS Score Sum  1

## 2022-11-16 NOTE — Progress Notes (Signed)
   11/16/22 0844  Assess: MEWS Score  Temp 98.8 F (37.1 C)  BP (!) 152/82  MAP (mmHg) 104  Pulse Rate 99  Resp 18  SpO2 96 %  O2 Device Room Air  Assess: MEWS Score  MEWS Temp 0  MEWS Systolic 0  MEWS Pulse 0  MEWS RR 0  MEWS LOC 0  MEWS Score 0  MEWS Score Color Green  Assess: SIRS CRITERIA  SIRS Temperature  0  SIRS Pulse 1  SIRS Respirations  0  SIRS WBC 1  SIRS Score Sum  2

## 2022-11-16 NOTE — Progress Notes (Signed)
Patient alert and verbal, complained of pain to abdomen several times during shift PRN given, see MAR. Ambulated with one assist to the bathroom. Tolerated medications whole.

## 2022-11-16 NOTE — Plan of Care (Signed)
Problem: Activity: Goal: Risk for activity intolerance will decrease Outcome: Progressing   Problem: Safety: Goal: Ability to remain free from injury will improve Outcome: Progressing   Problem: Skin Integrity: Goal: Risk for impaired skin integrity will decrease Outcome: Progressing

## 2022-11-16 NOTE — Progress Notes (Signed)
PROGRESS NOTE  Marilyn Rivera, is a 42 y.o. female, DOB - 1980-11-20, MWN:[id number]  Admit date - 11/15/2022   Admitting Physician Khing Belcher Mariea Clonts, MD  Outpatient Primary MD for the patient is [name], DO  LOS - 0  Chief Complaint  Patient presents with   Abdominal Pain      Brief Narrative:   42 y.o. female with past medical history relevant for morbid obesity, DM2, diverticulosis with prior episodes of diverticulitis, and prior partial colectomy, iron deficient anemia in the setting of menorrhagia     -Assessment and Plan: 1) acute colitis--CT abdomen and pelvis suggest transverse colon and descending colon colitis -Patient has history of diverticulosis with prior colitis and prior partial colectomy--imaging studies at this time does not suggest diverticulitis -Stool for C. difficile and stool culture requested WBC 13.7 >>17.0 T max 99.5 -Blood cultures requested -Continue IV Unasyn   2) constipation--suspect some component of IBS--laxatives as ordered -Continue PTA Linzess  -Continue laxatives -Patient reports no BM since 11/09/2022 -Attempted to have BM on 11/14/2022 but had just a couple of "peebles"   3)Morbid Obesity- -patient reports that she lost about 150 pounds for the last year and a half while on Mounjaro -Low calorie diet, portion control and increase physical activity discussed with patient -Body mass index is 50.22 kg/m.  4)DM2-A1c 5.3 reflecting excellent diabetic control PTA Hold PTA Mounjaro  use Novolog/Humalog Sliding scale insulin with Accu-Cheks/Fingersticks as ordered    5) chronic iron deficiency anemia in the setting of menorrhagia-- -LMP 2 weeks ago -Hgb close to prior -Monitor closely   6)HTN--continue amlodipine  -okay to use IV hydralazine as needed  7) hypokalemia--replace and recheck  Status is: Inpatient   Disposition: The patient is from: Home              Anticipated d/c is to: Home              Anticipated d/c date  is: 2 days              Patient currently is not medically stable to d/c. Barriers: Not Clinically Stable-   Code Status :  -  Code Status: Full Code   Family Communication:    NA (patient is alert, awake and coherent)   DVT Prophylaxis  :   - SCDs   heparin injection 5,000 Units Start: 11/15/22 2200 SCDs Start: 11/15/22 1728 Place TED hose Start: 11/15/22 1728   Lab Results  Component Value Date   PLT 205 11/16/2022    Inpatient Medications  Scheduled Meds:  amLODipine  5 mg Oral Daily   heparin  5,000 Units Subcutaneous Q8H   lactulose  30 g Oral Once   linaclotide  290 mcg Oral QAC breakfast   montelukast  10 mg Oral QHS   pantoprazole  40 mg Oral BID   polyethylene glycol  17 g Oral BID   senna-docusate  2 tablet Oral QHS   sodium chloride flush  3 mL Intravenous Q12H   sodium chloride flush  3 mL Intravenous Q12H   Continuous Infusions:  sodium chloride 100 mL/hr at 11/16/22 1505   sodium chloride     ampicillin-sulbactam (UNASYN) IV Stopped (11/16/22 1033)   PRN Meds:.sodium chloride, acetaminophen **OR** acetaminophen, fentaNYL (SUBLIMAZE) injection, hydrALAZINE, ondansetron **OR** ondansetron (ZOFRAN) IV, oxyCODONE, sodium chloride flush, traZODone   Anti-infectives (From admission, onward)    Start     Dose/Rate Route Frequency Ordered Stop   11/15/22 2000  Ampicillin-Sulbactam (UNASYN) 3 g  in sodium chloride 0.9 % 100 mL IVPB        3 g 200 mL/hr over 30 Minutes Intravenous Every 6 hours 11/15/22 1738     11/15/22 1400  Ampicillin-Sulbactam (UNASYN) 3 g in sodium chloride 0.9 % 100 mL IVPB        3 g 200 mL/hr over 30 Minutes Intravenous  Once 11/15/22 1345 11/15/22 1438        Subjective: Mason Jim today has no further emesis,  No chest pain,    T max 99.5, no chills -Abdominal pain and constipation concerns persist -No dysuria -Oral intake is fair  Objective: Vitals:   11/15/22 2300 11/16/22 0500 11/16/22 0844 11/16/22 1413  BP: (!)  163/84 (!) 172/97 (!) 152/82 (!) 146/76  Pulse: (!) 103 (!) 118 99 95  Resp:   18 20  Temp: 99.8 F (37.7 C) (!) 97.3 F (36.3 C) 98.8 F (37.1 C) 99.5 F (37.5 C)  TempSrc: Oral Oral Oral Oral  SpO2: 93% 93% 96% 97%  Weight:      Height:        Intake/Output Summary (Last 24 hours) at 11/16/2022 1524 Last data filed at 11/16/2022 1505 Gross per 24 hour  Intake 2658.4 ml  Output --  Net 2658.4 ml   Filed Weights   11/15/22 0847 11/15/22 0948  Weight: (!) 158.8 kg (!) 158.8 kg    Physical Exam  Gen:- Awake Alert, morbidly obese, in no acute distress HEENT:- .AT, No sclera icterus Neck-Supple Neck,No JVD,.  Lungs-  CTAB , fair symmetrical air movement CV- S1, S2 normal, regular  Abd-  +ve B.Sounds, Abd Soft, left lower abdominal tenderness , no rebound or guarding, increased adiposity noted  extremity/Skin:- No  edema, pedal pulses present  Psych-affect is appropriate, oriented x3 Neuro-no new focal deficits, no tremors  Data Reviewed: I have personally reviewed following labs and imaging studies  CBC: Recent Labs  Lab 11/15/22 0952 11/16/22 0500  WBC 13.7* 17.0*  NEUTROABS 11.2*  --   HGB 10.7* 10.7*  HCT 34.3* 35.0*  MCV 78.5* 79.9*  PLT 274 205   Basic Metabolic Panel: Recent Labs  Lab 11/15/22 0952 11/16/22 0500  NA 136 136  K 3.5 3.1*  CL 104 105  CO2 20* 23  GLUCOSE 113* 105*  BUN 13 10  CREATININE 1.03* 1.00  CALCIUM 8.6* 8.1*   GFR: Estimated Creatinine Clearance: 121 mL/min (by C-G formula based on SCr of 1 mg/dL). Liver Function Tests: Recent Labs  Lab 11/15/22 0952  AST 24  ALT 17  ALKPHOS 58  BILITOT 0.9  PROT 6.9  ALBUMIN 3.5   Recent Results (from the past 240 hour(s))  Culture, blood (Routine X 2) w Reflex to ID Panel     Status: None (Preliminary result)   Collection Time: 11/16/22  9:09 AM   Specimen: Left Antecubital; Blood  Result Value Ref Range Status   Specimen Description   Final    LEFT ANTECUBITAL BOTTLES  DRAWN AEROBIC AND ANAEROBIC   Special Requests   Final    Blood Culture adequate volume Performed at [facility], [address], [place], [place] [address]    Culture PENDING  Incomplete   Report Status PENDING  Incomplete  Culture, blood (Routine X 2) w Reflex to ID Panel     Status: None (Preliminary result)   Collection Time: 11/16/22  9:09 AM   Specimen: BLOOD LEFT HAND  Result Value Ref Range Status   Specimen Description BLOOD  LEFT HAND BOTTLES DRAWN AEROBIC ONLY  Final   Special Requests   Final    Blood Culture adequate volume Performed at [facility], [address], [place], [place] [address]    Culture PENDING  Incomplete   Report Status PENDING  Incomplete    Radiology Studies: CT ABDOMEN PELVIS W CONTRAST  Result Date: 11/15/2022 CLINICAL DATA:  LLQ abdominal pain. EXAM: CT ABDOMEN AND PELVIS WITH CONTRAST TECHNIQUE: Multidetector CT imaging of the abdomen and pelvis was performed using the standard protocol following bolus administration of intravenous contrast. RADIATION DOSE REDUCTION: This exam was performed according to the departmental dose-optimization program which includes automated exposure control, adjustment of the mA and/or kV according to patient size and/or use of iterative reconstruction technique. CONTRAST:  OMNIPAQUE IOHEXOL 300 MG/ML  SOLN COMPARISON:  CT scan abdomen and pelvis from 06/26/2022. FINDINGS: Lower chest: There are dependent atelectatic changes in the visualized lung bases. No overt consolidation. No pleural effusion. The heart is normal in size. No pericardial effusion. Hepatobiliary: The liver is normal in size. Non-cirrhotic configuration. No suspicious mass. No intrahepatic or extrahepatic bile duct dilation. No calcified gallstones. Normal gallbladder wall thickness. No pericholecystic inflammatory changes. Pancreas: Unremarkable. No pancreatic ductal dilatation or surrounding inflammatory changes. Spleen: Within normal limits. No focal  lesion. Adrenals/Urinary Tract: Adrenal glands are unremarkable. No suspicious renal mass. No hydronephrosis. No renal or ureteric calculi. Unremarkable urinary bladder. Stomach/Bowel: No disproportionate dilation of the small or large bowel loops. Redemonstration of slightly prominent appendix measuring up to 8 mm in the base with globular dilation of the apex measuring up to 13 mm. No surrounding fat stranding or appendicoliths. This appearance is essentially unchanged since the prior study. There are scattered diverticula in the descending colon. Patient is status post colocolonic anastomosis in the left lower quadrant. There is liquid unformed stool in the rest of the colon. There is mild fat stranding surrounding the transverse colon and descending colon without significant wall thickening. Findings favor early/subacute colitis. Vascular/Lymphatic: No ascites or pneumoperitoneum. No abdominal or pelvic lymphadenopathy, by size criteria. No aneurysmal dilation of the major abdominal arteries. Reproductive: Slightly globular appearing uterus is incompletely characterized on the current examination but appears grossly unchanged since the prior study. No large adnexal mass. Other: Periumbilical focal scar and tiny fat containing hernia noted. The soft tissues and abdominal wall are otherwise unremarkable. Musculoskeletal: No suspicious osseous lesions. There are mild multilevel degenerative changes in the visualized spine. IMPRESSION: 1. There is mild fat stranding surrounding the transverse colon and descending colon without significant wall thickening. Findings favor early/subacute colitis. 2. Liquid unformed stool in the majority of the colon, nonspecific but commonly seen with diarrhea. Diverticulosis without superimposed acute diverticulitis. 3. Multiple other nonacute observations, as described above. Electronically Signed   By: Jules Schick M.D.   On: 11/15/2022 13:36    Scheduled Meds:  amLODipine  5  mg Oral Daily   heparin  5,000 Units Subcutaneous Q8H   lactulose  30 g Oral Once   linaclotide  290 mcg Oral QAC breakfast   montelukast  10 mg Oral QHS   pantoprazole  40 mg Oral BID   polyethylene glycol  17 g Oral BID   senna-docusate  2 tablet Oral QHS   sodium chloride flush  3 mL Intravenous Q12H   sodium chloride flush  3 mL Intravenous Q12H   Continuous Infusions:  sodium chloride 100 mL/hr at 11/16/22 1505   sodium chloride     ampicillin-sulbactam (UNASYN)  IV Stopped (11/16/22 1033)    LOS: 0 days   Shon Hale M.D on 11/16/2022 at 3:24 PM  Go to [email or web address] - for contact info  [facility] - Office  [phone]  If 7PM-7AM, please contact night-coverage [email or web address] 11/16/2022, 3:24 PM

## 2022-11-17 DIAGNOSIS — K529 Noninfective gastroenteritis and colitis, unspecified: Secondary | ICD-10-CM | POA: Diagnosis not present

## 2022-11-17 LAB — BASIC METABOLIC PANEL
Anion gap: 8 (ref 5–15)
BUN: 8 mg/dL (ref 6–20)
CO2: 22 mmol/L (ref 22–32)
Calcium: 7.7 mg/dL — ABNORMAL LOW (ref 8.9–10.3)
Chloride: 108 mmol/L (ref 98–111)
Creatinine, Ser: 0.99 mg/dL (ref 0.44–1.00)
GFR, Estimated: >60 mL/min (ref >60)
Glucose, Bld: 84 mg/dL (ref 70–99)
Potassium: 3.4 mmol/L — ABNORMAL LOW (ref 3.5–5.1)
Sodium: 138 mmol/L (ref 135–145)

## 2022-11-17 LAB — GASTROINTESTINAL PANEL BY PCR, STOOL (REPLACES STOOL CULTURE)

## 2022-11-17 LAB — CBC
HCT: 28.9 % — ABNORMAL LOW (ref 36.0–46.0)
Hemoglobin: 8.7 g/dL — ABNORMAL LOW (ref 12.0–15.0)
MCH: 24.5 pg — ABNORMAL LOW (ref 26.0–34.0)
MCHC: 30.1 g/dL (ref 30.0–36.0)
MCV: 81.4 fL (ref 80.0–100.0)
Platelets: 195 10*3/uL (ref 150–400)
RBC: 3.55 MIL/uL — ABNORMAL LOW (ref 3.87–5.11)
RDW: 17.3 % — ABNORMAL HIGH (ref 11.5–15.5)
WBC: 11.4 10*3/uL — ABNORMAL HIGH (ref 4.0–10.5)
nRBC: 0 % (ref 0.0–0.2)

## 2022-11-17 MED ORDER — LACTULOSE 10 GM/15ML PO SOLN
30.0000 g | Freq: Once | ORAL | Status: AC
  Administered 2022-11-17: 30 g via ORAL
  Filled 2022-11-17: qty 60

## 2022-11-17 MED ORDER — POTASSIUM CHLORIDE CRYS ER 20 MEQ PO TBCR
40.0000 meq | EXTENDED_RELEASE_TABLET | ORAL | Status: AC
  Administered 2022-11-17 (×2): 40 meq via ORAL
  Filled 2022-11-17 (×2): qty 2

## 2022-11-17 MED ORDER — FERROUS SULFATE 325 (65 FE) MG PO TABS
325.0000 mg | ORAL_TABLET | Freq: Every day | ORAL | Status: DC
  Administered 2022-11-17 – 2022-11-18 (×2): 325 mg via ORAL
  Filled 2022-11-17 (×2): qty 1

## 2022-11-17 MED ORDER — SODIUM CHLORIDE 0.9 % IV SOLN: INTRAVENOUS | Status: AC

## 2022-11-17 NOTE — Progress Notes (Addendum)
PROGRESS NOTE  Marilyn Rivera, is a 42 y.o. female, DOB - 05/17/1980, UYQ:[id number]  Admit date - 11/15/2022   Admitting Physician Sharee Sturdy Mariea Clonts, MD  Outpatient Primary MD for the patient is [name], DO  LOS - 1  Chief Complaint  Patient presents with   Abdominal Pain      Brief Narrative:   42 y.o. female with past medical history relevant for morbid obesity, DM2, diverticulosis with prior episodes of diverticulitis, and prior partial colectomy, iron deficient anemia in the setting of menorrhagia   -possible discharge home on Augmentin on Monday 11/18/2022 if continues to improve and if blood cultures and GI pathogen/stool culture are negative   -Assessment and Plan: 1) acute colitis--CT abdomen and pelvis suggest transverse colon and descending colon colitis -Patient has history of diverticulosis with prior colitis and prior partial colectomy--imaging studies at this time does not suggest diverticulitis -Stool for C. difficile negative  WBC 13.7 >>17.0>>11.4 11/17/22 T max 100.2, T-current 98.3 -Blood cultures NGTD -Abdominal pain improving -Stool culture/GI pathogen pending -Continue IV Unasyn------    2) constipation--suspect some component of IBS--laxatives as ordered -Continue PTA Linzess  -Continue laxatives -Patient reports no BM since 11/09/2022 -Attempted to have BM on 11/14/2022 but had just a couple of "peebles" -she has had 3 small BMs this admission with above measures   3)Morbid Obesity- -patient reports that she lost about 140 pounds for the last year and a half while on Mounjaro -Low calorie diet, portion control and increase physical activity discussed with patient -Body mass index is 50.22 kg/m.  4)DM2-A1c 5.3 reflecting excellent diabetic control PTA Hold PTA Mounjaro  use Novolog/Humalog Sliding scale insulin with Accu-Cheks/Fingersticks as ordered    5) acute on chronic iron deficiency anemia in the setting of menorrhagia-- -LMP 2 weeks  ago -Hgb trending down due to hemodilution/IV fluids -MCV and MCH are low, RDW high -Prior workup consistent with iron deficiency anemia -Okay to start iron supplementation -Monitor closely and transfuse as clinically indicated  6)HTN--continue amlodipine  -okay to use IV hydralazine as needed  7)Hypokalemia--replace and recheck  Status is: Inpatient   Disposition: The patient is from: Home              Anticipated d/c is to: Home              Anticipated d/c date is: 1 day              Patient currently is not medically stable to d/c. Barriers: Not Clinically Stable-   Code Status :  -  Code Status: Full Code   Family Communication:    NA (patient is alert, awake and coherent)   DVT Prophylaxis  :   - SCDs   heparin injection 5,000 Units Start: 11/15/22 2200 SCDs Start: 11/15/22 1728 Place TED hose Start: 11/15/22 1728   Lab Results  Component Value Date   PLT 195 11/17/2022    Inpatient Medications  Scheduled Meds:  amLODipine  10 mg Oral Daily   heparin  5,000 Units Subcutaneous Q8H   linaclotide  290 mcg Oral QAC breakfast   montelukast  10 mg Oral QHS   pantoprazole  40 mg Oral BID   polyethylene glycol  17 g Oral BID   senna-docusate  2 tablet Oral QHS   sodium chloride flush  3 mL Intravenous Q12H   sodium chloride flush  3 mL Intravenous Q12H   Continuous Infusions:  sodium chloride 100 mL/hr at 11/16/22 1505   sodium  chloride     ampicillin-sulbactam (UNASYN) IV 3 g (11/17/22 0825)   PRN Meds:.sodium chloride, acetaminophen **OR** acetaminophen, fentaNYL (SUBLIMAZE) injection, hydrALAZINE, ondansetron **OR** ondansetron (ZOFRAN) IV, oxyCODONE, sodium chloride flush, traZODone   Anti-infectives (From admission, onward)    Start     Dose/Rate Route Frequency Ordered Stop   11/15/22 2000  Ampicillin-Sulbactam (UNASYN) 3 g in sodium chloride 0.9 % 100 mL IVPB        3 g 200 mL/hr over 30 Minutes Intravenous Every 6 hours 11/15/22 1738     11/15/22  1400  Ampicillin-Sulbactam (UNASYN) 3 g in sodium chloride 0.9 % 100 mL IVPB        3 g 200 mL/hr over 30 Minutes Intravenous  Once 11/15/22 1345 11/15/22 1438        Subjective: Marilyn Rivera today has no further emesis,  No chest pain,    T max 100.2, T-current 98.3 -Had 3 small BMs over the last 24 hours with laxatives -Abdominal pain improving -No dysuria -Oral intake is fair  Objective: Vitals:   11/16/22 1758 11/16/22 2045 11/17/22 0523 11/17/22 0817  BP: (!) 156/74 (!) 149/75 122/74 (!) 148/76  Pulse: 95 91 70 89  Resp: 18 18 16    Temp: 99.3 F (37.4 C) 100.2 F (37.9 C) 98.3 F (36.8 C)   TempSrc: Oral Oral Oral   SpO2: 94% 98% 97% 99%  Weight:      Height:        Intake/Output Summary (Last 24 hours) at 11/17/2022 1354 Last data filed at 11/17/2022 0843 Gross per 24 hour  Intake 1862.29 ml  Output --  Net 1862.29 ml   Filed Weights   11/15/22 0847 11/15/22 0948  Weight: (!) 158.8 kg (!) 158.8 kg    Physical Exam Gen:- Awake Alert, morbidly obese, in no acute distress HEENT:- [place].AT, No sclera icterus Neck-Supple Neck,No JVD,.  Lungs-  CTAB , fair symmetrical air movement CV- S1, S2 normal, regular  Abd-  +ve B.Sounds, Abd Soft, left lower abdominal tenderness , no rebound or guarding, increased adiposity noted  extremity/Skin:- No  edema, pedal pulses present  Psych-affect is appropriate, oriented x3 Neuro-no new focal deficits, no tremors  Data Reviewed: I have personally reviewed following labs and imaging studies  CBC: Recent Labs  Lab 11/15/22 0952 11/16/22 0500 11/17/22 0426  WBC 13.7* 17.0* 11.4*  NEUTROABS 11.2*  --   --   HGB 10.7* 10.7* 8.7*  HCT 34.3* 35.0* 28.9*  MCV 78.5* 79.9* 81.4  PLT 274 205 195   Basic Metabolic Panel: Recent Labs  Lab 11/15/22 0952 11/16/22 0500 11/17/22 0426  NA 136 136 138  K 3.5 3.1* 3.4*  CL 104 105 108  CO2 20* 23 22  GLUCOSE 113* 105* 84  BUN 13 10 8   CREATININE 1.03* 1.00 0.99  CALCIUM  8.6* 8.1* 7.7*   GFR: Estimated Creatinine Clearance: 122.2 mL/min (by C-G formula based on SCr of 0.99 mg/dL). Liver Function Tests: Recent Labs  Lab 11/15/22 0952  AST 24  ALT 17  ALKPHOS 58  BILITOT 0.9  PROT 6.9  ALBUMIN 3.5   Recent Results (from the past 240 hour(s))  Culture, blood (Routine X 2) w Reflex to ID Panel     Status: None (Preliminary result)   Collection Time: 11/16/22  9:09 AM   Specimen: Left Antecubital; Blood  Result Value Ref Range Status   Specimen Description   Final    LEFT ANTECUBITAL BOTTLES DRAWN AEROBIC AND ANAEROBIC  Special Requests Blood Culture adequate volume  Final   Culture   Final    NO GROWTH < 24 HOURS Performed at [facility], [address], [place], [place] [address]    Report Status PENDING  Incomplete  Culture, blood (Routine X 2) w Reflex to ID Panel     Status: None (Preliminary result)   Collection Time: 11/16/22  9:09 AM   Specimen: BLOOD LEFT HAND  Result Value Ref Range Status   Specimen Description BLOOD LEFT HAND BOTTLES DRAWN AEROBIC ONLY  Final   Special Requests Blood Culture adequate volume  Final   Culture   Final    NO GROWTH < 24 HOURS Performed at [facility], [address] [place], [place] [address]    Report Status PENDING  Incomplete  C Difficile Quick Screen w PCR reflex     Status: None   Collection Time: 11/16/22  8:40 PM   Specimen: Rectum; Stool  Result Value Ref Range Status   C Diff antigen NEGATIVE NEGATIVE Final   C Diff toxin NEGATIVE NEGATIVE Final   C Diff interpretation No C. difficile detected.  Final    Comment: Performed at [facility], [address] [place], [place] [address]    Scheduled Meds:  amLODipine  10 mg Oral Daily   heparin  5,000 Units Subcutaneous Q8H   linaclotide  290 mcg Oral QAC breakfast   montelukast  10 mg Oral QHS   pantoprazole  40 mg Oral BID   polyethylene glycol  17 g Oral BID   senna-docusate  2 tablet Oral QHS   sodium chloride flush  3 mL  Intravenous Q12H   sodium chloride flush  3 mL Intravenous Q12H   Continuous Infusions:  sodium chloride 100 mL/hr at 11/16/22 1505   sodium chloride     ampicillin-sulbactam (UNASYN) IV 3 g (11/17/22 0825)    LOS: 1 day   Shon Hale M.D on 11/17/2022 at 1:54 PM  Go to [email or web address] - for contact info  [facility] - Office  [phone]  If 7PM-7AM, please contact night-coverage [email or web address] 11/17/2022, 1:54 PM

## 2022-11-17 NOTE — Plan of Care (Signed)

## 2022-11-18 DIAGNOSIS — K529 Noninfective gastroenteritis and colitis, unspecified: Secondary | ICD-10-CM | POA: Diagnosis not present

## 2022-11-18 LAB — FOLATE: Folate: 5.8 ng/mL — ABNORMAL LOW (ref >5.9)

## 2022-11-18 LAB — BASIC METABOLIC PANEL
Anion gap: 7 (ref 5–15)
BUN: 6 mg/dL (ref 6–20)
CO2: 22 mmol/L (ref 22–32)
Calcium: 8 mg/dL — ABNORMAL LOW (ref 8.9–10.3)
Chloride: 109 mmol/L (ref 98–111)
Creatinine, Ser: 0.97 mg/dL (ref 0.44–1.00)
GFR, Estimated: >60 mL/min (ref >60)
Glucose, Bld: 88 mg/dL (ref 70–99)
Potassium: 3.6 mmol/L (ref 3.5–5.1)
Sodium: 138 mmol/L (ref 135–145)

## 2022-11-18 LAB — CBC
HCT: 29.3 % — ABNORMAL LOW (ref 36.0–46.0)
Hemoglobin: 8.7 g/dL — ABNORMAL LOW (ref 12.0–15.0)
MCH: 24.1 pg — ABNORMAL LOW (ref 26.0–34.0)
MCHC: 29.7 g/dL — ABNORMAL LOW (ref 30.0–36.0)
MCV: 81.2 fL (ref 80.0–100.0)
Platelets: 207 10*3/uL (ref 150–400)
RBC: 3.61 MIL/uL — ABNORMAL LOW (ref 3.87–5.11)
RDW: 17.2 % — ABNORMAL HIGH (ref 11.5–15.5)
WBC: 6.9 10*3/uL (ref 4.0–10.5)
nRBC: 0.3 % — ABNORMAL HIGH (ref 0.0–0.2)

## 2022-11-18 LAB — MAGNESIUM: Magnesium: 1.7 mg/dL (ref 1.7–2.4)

## 2022-11-18 LAB — IRON AND TIBC
Iron: 26 ug/dL — ABNORMAL LOW (ref 28–170)
Saturation Ratios: 8 % — ABNORMAL LOW (ref 10.4–31.8)
TIBC: 309 ug/dL (ref 250–450)
UIBC: 283 ug/dL

## 2022-11-18 LAB — PHOSPHORUS: Phosphorus: 2.9 mg/dL (ref 2.5–4.6)

## 2022-11-18 LAB — VITAMIN B12: Vitamin B-12: 145 pg/mL — ABNORMAL LOW (ref 180–914)

## 2022-11-18 LAB — VITAMIN D 25 HYDROXY (VIT D DEFICIENCY, FRACTURES): Vit D, 25-Hydroxy: 43.1 ng/mL (ref 30–100)

## 2022-11-18 MED ORDER — VITAMIN C 500 MG PO TABS
500.0000 mg | ORAL_TABLET | Freq: Every day | ORAL | Status: DC
  Administered 2022-11-18 – 2022-11-19 (×2): 500 mg via ORAL
  Filled 2022-11-18 (×2): qty 1

## 2022-11-18 MED ORDER — FERROUS SULFATE 325 (65 FE) MG PO TABS
325.0000 mg | ORAL_TABLET | Freq: Two times a day (BID) | ORAL | Status: DC
  Administered 2022-11-18 – 2022-11-19 (×2): 325 mg via ORAL
  Filled 2022-11-18 (×2): qty 1

## 2022-11-18 MED ORDER — CYANOCOBALAMIN 1000 MCG/ML IJ SOLN
1000.0000 ug | Freq: Every day | INTRAMUSCULAR | Status: AC
  Administered 2022-11-18 – 2022-11-19 (×2): 1000 ug via INTRAMUSCULAR
  Filled 2022-11-18 (×2): qty 1

## 2022-11-18 MED ORDER — FOLIC ACID 1 MG PO TABS
1.0000 mg | ORAL_TABLET | Freq: Every day | ORAL | Status: DC
  Administered 2022-11-18 – 2022-11-19 (×2): 1 mg via ORAL
  Filled 2022-11-18 (×2): qty 1

## 2022-11-18 MED ORDER — INFLUENZA VIRUS VACC SPLIT PF (FLUZONE) 0.5 ML IM SUSY
0.5000 mL | PREFILLED_SYRINGE | INTRAMUSCULAR | Status: AC
  Administered 2022-11-19: 0.5 mL via INTRAMUSCULAR
  Filled 2022-11-18: qty 0.5

## 2022-11-18 MED ORDER — VITAMIN B-12 1000 MCG PO TABS: 1000.0000 ug | ORAL_TABLET | Freq: Every day | ORAL | Status: DC

## 2022-11-18 NOTE — Progress Notes (Signed)
   11/18/22 0937  TOC Brief Assessment  Insurance and Status Reviewed  Patient has primary care physician Yes  Home environment has been reviewed from home  Prior level of function: independent  Prior/Current Home Services No current home services  Social Determinants of Health Reivew SDOH reviewed no interventions necessary  Readmission risk has been reviewed Yes  Transition of care needs no transition of care needs at this time     Transition of Care Department [facility]) has reviewed patient and no TOC needs have been identified at this time. We will continue to monitor patient advancement through interdisciplinary progression rounds. If new patient transition needs arise, please place a TOC consult.

## 2022-11-18 NOTE — Progress Notes (Signed)
[facility] Progress Note  Patient: Marilyn Rivera    ZOX:[id number]  DOA: 11/15/2022     Date of Service: the patient was seen and examined on 11/18/2022  Chief Complaint  Patient presents with   Abdominal Pain   Brief hospital course: 42 y.o. female with past medical history relevant for morbid obesity, DM2, diverticulosis with prior episodes of diverticulitis, and prior partial colectomy, iron deficient anemia in the setting of menorrhagia   -possible discharge home on Augmentin on Monday 11/18/2022 if continues to improve and if blood cultures and GI pathogen/stool culture are negative   Assessment and Plan:  # Acute colitis--CT abdomen and pelvis suggest transverse colon and descending colon colitis -Patient has history of diverticulosis with prior colitis and prior partial colectomy--imaging studies at this time does not suggest diverticulitis -Stool for C. difficile negative  WBC 13.7 >>17.0>>11.4 11/17/22 T max 100.2, T-current 98.3 -Blood cultures NGTD -Abdominal pain improving -Stool culture/GI pathogen pending -Continue IV Unasyn------    # Anemia secondary to iron deficiency, folic acid deficiency and vitamin B12 deficiency Transferrin saturation 8%, started ferrous sulfate 325 mg p.o. twice daily Folic acid 1 mg p.o. daily W11 1000 mcg IM injection daily x 2 doses followed by oral supplement. Follow with PCP to repeat iron profile, vitamin B12 and folic acid level after 3 to 6 months   #  constipation--suspect some component of IBS--laxatives as ordered -Continue PTA Linzess  -Continue laxatives -Patient reports no BM since 11/09/2022 -Attempted to have BM on 11/14/2022 but had just a couple of "peebles" -she has had 3 small BMs this admission with above measures 9/23 pt had a large BM last night  # Morbid Obesity- -patient reports that she lost about 140 pounds for the last year and a half while on Mounjaro -Low calorie diet, portion control and  increase physical activity discussed with patient -Body mass index is 50.22 kg/m.   # DM2-A1c 5.3 reflecting excellent diabetic control PTA Hold PTA Mounjaro  use Novolog/Humalog Sliding scale insulin with Accu-Cheks/Fingersticks as ordered    # acute on chronic iron deficiency anemia in the setting of menorrhagia-- -LMP 2 weeks ago -Hgb trending down due to hemodilution/IV fluids -MCV and MCH are low, RDW high -Prior workup consistent with iron deficiency anemia -Okay to start iron supplementation -Monitor closely and transfuse as clinically indicated   # HTN--continue amlodipine  -okay to use IV hydralazine as needed   # Hypokalemia--replaced.  Resolved Recheck and replete as needed   Body mass index is 50.22 kg/m.  Interventions:  Diet: Heart healthy carb modified diet DVT Prophylaxis: Subcutaneous Heparin    Advance goals of care discussion: Full code  Family Communication: family was not present at bedside, at the time of interview.  The pt provided permission to discuss medical plan with the family. Opportunity was given to ask question and all questions were answered satisfactorily.   Disposition:  Pt is from Home, admitted with Colitis, developed low Hb,  which precludes a safe discharge. Discharge to home, when stable most likely tomorrow a.m.  Subjective: No significant events overnight, patient still had abdominal pain 7/10 which is controlled with medications.  Patient did not move bowels last night, no bleeding. Denied any other complaints.  Physical Exam: General: NAD, lying comfortably Appear in no distress, affect appropriate Eyes: PERRLA ENT: Oral Mucosa Clear, moist  Neck: no JVD,  Cardiovascular: S1 and S2 Present, no Murmur,  Respiratory: good respiratory effort, Bilateral Air entry equal and Decreased, no  Crackles, no wheezes Abdomen: Bowel Sound present, Soft and no tenderness,  Skin: no rashes Extremities: no Pedal edema, no calf  tenderness Neurologic: without any new focal findings Gait not checked due to patient safety concerns  Vitals:   11/17/22 1509 11/17/22 2059 11/18/22 0509 11/18/22 0937  BP: 138/84 124/74 (!) 125/56 (!) 141/80  Pulse: 80 69 60 73  Resp: 18 18 20    Temp: 98 F (36.7 C) 98.4 F (36.9 C) 98.4 F (36.9 C)   TempSrc: Oral Oral Oral   SpO2: 100% 99% 94%   Weight:      Height:        Intake/Output Summary (Last 24 hours) at 11/18/2022 1342 Last data filed at 11/18/2022 1300 Gross per 24 hour  Intake 3239.32 ml  Output --  Net 3239.32 ml   Filed Weights   11/15/22 0847 11/15/22 0948  Weight: (!) 158.8 kg (!) 158.8 kg    Data Reviewed: I have personally reviewed and interpreted daily labs, tele strips, imagings as discussed above. I reviewed all nursing notes, pharmacy notes, vitals, pertinent old records I have discussed plan of care as described above with RN and patient/family.  CBC: Recent Labs  Lab 11/15/22 0952 11/16/22 0500 11/17/22 0426 11/18/22 0441  WBC 13.7* 17.0* 11.4* 6.9  NEUTROABS 11.2*  --   --   --   HGB 10.7* 10.7* 8.7* 8.7*  HCT 34.3* 35.0* 28.9* 29.3*  MCV 78.5* 79.9* 81.4 81.2  PLT 274 205 195 207   Basic Metabolic Panel: Recent Labs  Lab 11/15/22 0952 11/16/22 0500 11/17/22 0426 11/18/22 0951  NA 136 136 138 138  K 3.5 3.1* 3.4* 3.6  CL 104 105 108 109  CO2 20* 23 22 22   GLUCOSE 113* 105* 84 88  BUN 13 10 8 6   CREATININE 1.03* 1.00 0.99 0.97  CALCIUM 8.6* 8.1* 7.7* 8.0*  MG  --   --   --  1.7  PHOS  --   --   --  2.9    Studies: No results found.  Scheduled Meds:  amLODipine  10 mg Oral Daily   ferrous sulfate  325 mg Oral Q breakfast   heparin  5,000 Units Subcutaneous Q8H   linaclotide  290 mcg Oral QAC breakfast   montelukast  10 mg Oral QHS   pantoprazole  40 mg Oral BID   polyethylene glycol  17 g Oral BID   senna-docusate  2 tablet Oral QHS   sodium chloride flush  3 mL Intravenous Q12H   sodium chloride flush  3 mL  Intravenous Q12H   Continuous Infusions:  sodium chloride     ampicillin-sulbactam (UNASYN) IV 3 g (11/18/22 1025)   PRN Meds: sodium chloride, acetaminophen **OR** acetaminophen, fentaNYL (SUBLIMAZE) injection, hydrALAZINE, ondansetron **OR** ondansetron (ZOFRAN) IV, oxyCODONE, sodium chloride flush, traZODone  Time spent: 35 minutes  Author: Gillis Santa. MD [facility] 11/18/2022 1:42 PM  To reach On-call, see care teams to locate the attending and reach out to them via [email or web address]. If 7PM-7AM, please contact night-coverage If you still have difficulty reaching the attending provider, please page the [facility] (Director on Call) for [facility] on amion for assistance.

## 2022-11-18 NOTE — Progress Notes (Signed)
Patient alert and verbal, tolerated medications whole with no complaints. Ambulated independently in room. Reported complaints of pain given PRN, see MAR.

## 2022-11-19 DIAGNOSIS — K529 Noninfective gastroenteritis and colitis, unspecified: Secondary | ICD-10-CM | POA: Diagnosis not present

## 2022-11-19 LAB — CBC
HCT: 29.3 % — ABNORMAL LOW (ref 36.0–46.0)
Hemoglobin: 9.1 g/dL — ABNORMAL LOW (ref 12.0–15.0)
MCH: 24.6 pg — ABNORMAL LOW (ref 26.0–34.0)
MCHC: 31.1 g/dL (ref 30.0–36.0)
MCV: 79.2 fL — ABNORMAL LOW (ref 80.0–100.0)
Platelets: 214 10*3/uL (ref 150–400)
RBC: 3.7 MIL/uL — ABNORMAL LOW (ref 3.87–5.11)
RDW: 17.2 % — ABNORMAL HIGH (ref 11.5–15.5)
WBC: 7.9 10*3/uL (ref 4.0–10.5)
nRBC: 0 % (ref 0.0–0.2)

## 2022-11-19 LAB — BASIC METABOLIC PANEL
Anion gap: 7 (ref 5–15)
BUN: 8 mg/dL (ref 6–20)
CO2: 25 mmol/L (ref 22–32)
Calcium: 8.2 mg/dL — ABNORMAL LOW (ref 8.9–10.3)
Chloride: 107 mmol/L (ref 98–111)
Creatinine, Ser: 0.99 mg/dL (ref 0.44–1.00)
GFR, Estimated: >60 mL/min (ref >60)
Glucose, Bld: 89 mg/dL (ref 70–99)
Potassium: 3.3 mmol/L — ABNORMAL LOW (ref 3.5–5.1)
Sodium: 139 mmol/L (ref 135–145)

## 2022-11-19 LAB — MAGNESIUM: Magnesium: 1.7 mg/dL (ref 1.7–2.4)

## 2022-11-19 LAB — PHOSPHORUS: Phosphorus: 3.7 mg/dL (ref 2.5–4.6)

## 2022-11-19 MED ORDER — POTASSIUM CHLORIDE 20 MEQ PO PACK
40.0000 meq | PACK | Freq: Once | ORAL | Status: DC
  Filled 2022-11-19: qty 2

## 2022-11-19 MED ORDER — FOLIC ACID 1 MG PO TABS: 1.0000 mg | ORAL_TABLET | Freq: Every day | ORAL | 0 refills | Status: AC

## 2022-11-19 MED ORDER — AMOXICILLIN-POT CLAVULANATE 875-125 MG PO TABS: 1.0000 | ORAL_TABLET | Freq: Two times a day (BID) | ORAL | 0 refills | Status: AC

## 2022-11-19 MED ORDER — CYANOCOBALAMIN 1000 MCG PO TABS: 1000.0000 ug | ORAL_TABLET | Freq: Every day | ORAL | 0 refills | Status: AC

## 2022-11-19 MED ORDER — IBUPROFEN 200 MG PO TABS: 400.0000 mg | ORAL_TABLET | Freq: Three times a day (TID) | ORAL | Status: AC | PRN

## 2022-11-19 MED ORDER — ASCORBIC ACID 500 MG PO TABS: 500.0000 mg | ORAL_TABLET | Freq: Every day | ORAL | 0 refills | Status: AC

## 2022-11-19 MED ORDER — FERROUS SULFATE 325 (65 FE) MG PO TABS: 325.0000 mg | ORAL_TABLET | Freq: Two times a day (BID) | ORAL | 0 refills | Status: DC

## 2022-11-19 MED ORDER — POTASSIUM CHLORIDE CRYS ER 20 MEQ PO TBCR
40.0000 meq | EXTENDED_RELEASE_TABLET | Freq: Once | ORAL | Status: AC
  Administered 2022-11-19: 40 meq via ORAL
  Filled 2022-11-19: qty 2

## 2022-11-19 NOTE — Plan of Care (Signed)

## 2022-11-19 NOTE — Discharge Summary (Signed)
[facility] Discharge Summary   Patient: Marilyn Rivera ZOX:[id number]  PCP: [name], DO  Date of admission: 11/15/2022   Date of discharge:  11/19/2022     Discharge Diagnoses:  Principal Problem:   Colitis Active Problems:   Morbid obesity (HCC)   GERD (gastroesophageal reflux disease)   Hypertension associated with diabetes (HCC)   Admitted From: Home Disposition:  Home   Recommendations for Outpatient Follow-up:  Follow-up with PCP in 1 week, repeat iron profile, B12 and folic acid level after 3 to 6 months. Follow-up with GI for possible colonoscopy in 4 to 6 weeks. Follow up LABS/TEST:     Diet recommendation: Cardiac and Carb modified diet  Activity: The patient is advised to gradually reintroduce usual activities, as tolerated  Discharge Condition: stable  Code Status: Full code   History of present illness: As per the H and P dictated on admission  Hospital Course:  42 y.o. female with past medical history relevant for morbid obesity, DM2, diverticulosis with prior episodes of diverticulitis, and prior partial colectomy, iron deficient anemia in the setting of menorrhagia   -possible discharge home on Augmentin on Monday 11/18/2022 if continues to improve and if blood cultures and GI pathogen/stool culture are negative   Assessment and Plan:   # Acute colitis--CT abdomen and pelvis suggest transverse colon and descending colon colitis -Patient has history of diverticulosis with prior colitis and prior partial colectomy--imaging studies at this time does not suggest diverticulitis -Stool for C. difficile negative  WBC 13.7 >>17.0>>11.4, On 11/17/22 T max 100.2, T-current 98.3 Blood cultures NGTD. Abdominal pain resolved. Stool C.diff and GI pathogen Negtaive.  S/p IV Unasyn, started Augmentin BID for 5 days on dc  # Constipation--suspect some component of IBS--started laxatives constipation resolved, patient is moving bowels.  Advised to  continue laxatives to prevent constipation.  With PCP for further management outpatient.  # DM2-A1c 5.3 reflecting excellent diabetic control PTA.  Held PTA [facility] during hospital stay and resumed on discharge.  Patient was given NovoLog sliding scale during hospital stay.  Patient was advised to monitor CBG and continue diabetic diet at home. # Anemia secondary to iron deficiency, folic acid deficiency and vitamin B12 deficiency, along with menorrhagia.  LMP 2 weeks ago.  No active bleeding at this time.  Low hemoglobin could be due to hemodilution/IV fluids. Transferrin saturation 8%, started ferrous sulfate 325 mg p.o. twice daily. Folic acid 1 mg p.o. daily. B12 1000 mcg IM injection daily x 2 doses followed by oral supplement. Follow with PCP to repeat iron profile, vitamin B12 and folic acid level after 3 to 6 months  # HTN--continue amlodipine, monitor BP at home and follow with PCP. # Hypokalemia--replaced.  Resolved # Morbid Obesity- -patient reports that she lost about 140 pounds for the last year and a half while on Mounjaro.  Continue low calorie diet, portion control and increase physical activity discussed with patient. Body mass index is 50.22 kg/m.  Nutrition Interventions:  Patient was ambulatory without any assistance. On the day of the discharge the patient's vitals were stable, and no other acute medical condition were reported by patient. the patient was felt safe to be discharge at Home .  Consultants: None Procedures: None  Discharge Exam: General: Appear in no distress, no Rash; Oral Mucosa Clear, moist. Cardiovascular: S1 and S2 Present, no Murmur, Respiratory: normal respiratory effort, Bilateral Air entry present and no Crackles, no wheezes Abdomen: Bowel Sound present, Soft and no tenderness, no  hernia Extremities: no Pedal edema, no calf tenderness Neurology: alert and oriented to time, place, and person affect appropriate.  Filed Weights   11/15/22 0847  11/15/22 0948  Weight: (!) 158.8 kg (!) 158.8 kg   Vitals:   11/19/22 0300 11/19/22 0800  BP: (!) 132/58 130/77  Pulse: 67 64  Resp: 20   Temp: 98.7 F (37.1 C)   SpO2: 95% 96%    DISCHARGE MEDICATION: Allergies as of 11/19/2022       Reactions   Keflex [cephalexin]    Yeast infections    Coconut Fatty Acid Itching, Rash        Medication List     STOP taking these medications    diclofenac 75 MG EC tablet Commonly known as: VOLTAREN   sulfamethoxazole-trimethoprim 800-160 MG tablet Commonly known as: Bactrim DS       TAKE these medications    amLODipine 5 MG tablet Commonly known as: NORVASC Take 1 tablet (5 mg total) by mouth daily.   amoxicillin-clavulanate 875-125 MG tablet Commonly known as: AUGMENTIN Take 1 tablet by mouth 2 (two) times daily for 5 days.   ascorbic acid 500 MG tablet Commonly known as: VITAMIN C Take 1 tablet (500 mg total) by mouth daily. Start taking on: November 20, 2022   cyanocobalamin 1000 MCG tablet Take 1 tablet (1,000 mcg total) by mouth daily. Start taking on: November 20, 2022   cyclobenzaprine 5 MG tablet Commonly known as: FLEXERIL Take 1 tablet (5 mg total) by mouth 3 (three) times daily as needed for muscle spasms.   EPINEPHrine 0.3 mg/0.3 mL Soaj injection Commonly known as: EPI-PEN Inject 0.3 mg into the muscle as needed for anaphylaxis (severe allergic reaction, then call 911).   ferrous sulfate 325 (65 FE) MG tablet Take 1 tablet (325 mg total) by mouth 2 (two) times daily with a meal.   folic acid 1 MG tablet Commonly known as: FOLVITE Take 1 tablet (1 mg total) by mouth daily. Start taking on: November 20, 2022   glucose blood test strip UAD to check sugar twice daily E11.649 (using accuchek)   ibuprofen 200 MG tablet Commonly known as: ADVIL Take 2 tablets (400 mg total) by mouth every 8 (eight) hours as needed for moderate pain. What changed:  how much to take when to take this   Lancet  Device Misc UAD to check sugar twice daily E11.649 (using Accucheck machine)   linaclotide 145 MCG Caps capsule Commonly known as: Linzess Take 1 capsule (145 mcg total) by mouth daily before breakfast. What changed:  when to take this reasons to take this   montelukast 10 MG tablet Commonly known as: SINGULAIR Take 1 tablet (10 mg total) by mouth at bedtime.   Mounjaro 15 MG/0.5ML Pen Generic drug: tirzepatide INJECT 15 MG SUBCUTANEOUSLY ONCE PER WEEK   omeprazole 40 MG capsule Commonly known as: PRILOSEC Take 1 capsule (40 mg total) by mouth daily.   ondansetron 4 MG tablet Commonly known as: ZOFRAN TAKE ONE TABLET EVERY 8 HOURS AS NEEDED FOR NAUSEA AND VOMITING       Allergies  Allergen Reactions   Keflex [Cephalexin]     Yeast infections    Coconut Fatty Acid Itching and Rash   Discharge Instructions     Call MD for:  extreme fatigue   Complete by: As directed    Call MD for:  persistant dizziness or light-headedness   Complete by: As directed    Call MD for:  persistant  nausea and vomiting   Complete by: As directed    Call MD for:  severe uncontrolled pain   Complete by: As directed    Call MD for:  temperature >100.4   Complete by: As directed    Diet - low sodium heart healthy   Complete by: As directed    Discharge instructions   Complete by: As directed    Follow-up with PCP in 1 week, repeat iron profile, B12 and folic acid level after 3 to 6 months. Follow-up with GI for possible colonoscopy in 4 to 6 weeks.   Increase activity slowly   Complete by: As directed        The results of significant diagnostics from this hospitalization (including imaging, microbiology, ancillary and laboratory) are listed below for reference.    Significant Diagnostic Studies: CT ABDOMEN PELVIS W CONTRAST  Result Date: 11/15/2022 CLINICAL DATA:  LLQ abdominal pain. EXAM: CT ABDOMEN AND PELVIS WITH CONTRAST TECHNIQUE: Multidetector CT imaging of the abdomen and  pelvis was performed using the standard protocol following bolus administration of intravenous contrast. RADIATION DOSE REDUCTION: This exam was performed according to the departmental dose-optimization program which includes automated exposure control, adjustment of the mA and/or kV according to patient size and/or use of iterative reconstruction technique. CONTRAST:  OMNIPAQUE IOHEXOL 300 MG/ML  SOLN COMPARISON:  CT scan abdomen and pelvis from 06/26/2022. FINDINGS: Lower chest: There are dependent atelectatic changes in the visualized lung bases. No overt consolidation. No pleural effusion. The heart is normal in size. No pericardial effusion. Hepatobiliary: The liver is normal in size. Non-cirrhotic configuration. No suspicious mass. No intrahepatic or extrahepatic bile duct dilation. No calcified gallstones. Normal gallbladder wall thickness. No pericholecystic inflammatory changes. Pancreas: Unremarkable. No pancreatic ductal dilatation or surrounding inflammatory changes. Spleen: Within normal limits. No focal lesion. Adrenals/Urinary Tract: Adrenal glands are unremarkable. No suspicious renal mass. No hydronephrosis. No renal or ureteric calculi. Unremarkable urinary bladder. Stomach/Bowel: No disproportionate dilation of the small or large bowel loops. Redemonstration of slightly prominent appendix measuring up to 8 mm in the base with globular dilation of the apex measuring up to 13 mm. No surrounding fat stranding or appendicoliths. This appearance is essentially unchanged since the prior study. There are scattered diverticula in the descending colon. Patient is status post colocolonic anastomosis in the left lower quadrant. There is liquid unformed stool in the rest of the colon. There is mild fat stranding surrounding the transverse colon and descending colon without significant wall thickening. Findings favor early/subacute colitis. Vascular/Lymphatic: No ascites or pneumoperitoneum. No abdominal  or pelvic lymphadenopathy, by size criteria. No aneurysmal dilation of the major abdominal arteries. Reproductive: Slightly globular appearing uterus is incompletely characterized on the current examination but appears grossly unchanged since the prior study. No large adnexal mass. Other: Periumbilical focal scar and tiny fat containing hernia noted. The soft tissues and abdominal wall are otherwise unremarkable. Musculoskeletal: No suspicious osseous lesions. There are mild multilevel degenerative changes in the visualized spine. IMPRESSION: 1. There is mild fat stranding surrounding the transverse colon and descending colon without significant wall thickening. Findings favor early/subacute colitis. 2. Liquid unformed stool in the majority of the colon, nonspecific but commonly seen with diarrhea. Diverticulosis without superimposed acute diverticulitis. 3. Multiple other nonacute observations, as described above. Electronically Signed   By: Jules Schick M.D.   On: 11/15/2022 13:36    Microbiology: Recent Results (from the past 240 hour(s))  Culture, blood (Routine X 2) w Reflex to ID Panel  Status: None (Preliminary result)   Collection Time: 11/16/22  9:09 AM   Specimen: Left Antecubital; Blood  Result Value Ref Range Status   Specimen Description   Final    LEFT ANTECUBITAL BOTTLES DRAWN AEROBIC AND ANAEROBIC   Special Requests Blood Culture adequate volume  Final   Culture   Final    NO GROWTH 3 DAYS Performed at [facility], [address], [place], [place] [address]    Report Status PENDING  Incomplete  Culture, blood (Routine X 2) w Reflex to ID Panel     Status: None (Preliminary result)   Collection Time: 11/16/22  9:09 AM   Specimen: BLOOD LEFT HAND  Result Value Ref Range Status   Specimen Description BLOOD LEFT HAND BOTTLES DRAWN AEROBIC ONLY  Final   Special Requests Blood Culture adequate volume  Final   Culture   Final    NO GROWTH 3 DAYS Performed at [facility],  [address], [place], [place] [address]    Report Status PENDING  Incomplete  Gastrointestinal Panel by PCR , Stool     Status: None   Collection Time: 11/16/22  8:40 PM   Specimen: Rectum; Stool  Result Value Ref Range Status   Campylobacter species NOT DETECTED NOT DETECTED Final   Plesimonas shigelloides NOT DETECTED NOT DETECTED Final   Salmonella species NOT DETECTED NOT DETECTED Final   Yersinia enterocolitica NOT DETECTED NOT DETECTED Final   Vibrio species NOT DETECTED NOT DETECTED Final   Vibrio cholerae NOT DETECTED NOT DETECTED Final   Enteroaggregative E coli (EAEC) NOT DETECTED NOT DETECTED Final   Enteropathogenic E coli (EPEC) NOT DETECTED NOT DETECTED Final   Enterotoxigenic E coli (ETEC) NOT DETECTED NOT DETECTED Final   Shiga like toxin producing E coli (STEC) NOT DETECTED NOT DETECTED Final   Shigella/Enteroinvasive E coli (EIEC) NOT DETECTED NOT DETECTED Final   Cryptosporidium NOT DETECTED NOT DETECTED Final   Cyclospora cayetanensis NOT DETECTED NOT DETECTED Final   Entamoeba histolytica NOT DETECTED NOT DETECTED Final   Giardia lamblia NOT DETECTED NOT DETECTED Final   Adenovirus F40/41 NOT DETECTED NOT DETECTED Final   Astrovirus NOT DETECTED NOT DETECTED Final   Norovirus GI/GII NOT DETECTED NOT DETECTED Final   Rotavirus A NOT DETECTED NOT DETECTED Final   Sapovirus (I, II, IV, and V) NOT DETECTED NOT DETECTED Final    Comment: Performed at [facility], [address] [place], [place] [address]  C Difficile Quick Screen w PCR reflex     Status: None   Collection Time: 11/16/22  8:40 PM   Specimen: Rectum; Stool  Result Value Ref Range Status   C Diff antigen NEGATIVE NEGATIVE Final   C Diff toxin NEGATIVE NEGATIVE Final   C Diff interpretation No C. difficile detected.  Final    Comment: Performed at [facility], [address], [place], [place] [address]     Labs: CBC: Recent Labs  Lab 11/15/22 [phone] 11/16/22 0500 11/17/22 0426  11/18/22 0441 11/19/22 0428  WBC 13.7* 17.0* 11.4* 6.9 7.9  NEUTROABS 11.2*  --   --   --   --   HGB 10.7* 10.7* 8.7* 8.7* 9.1*  HCT 34.3* 35.0* 28.9* 29.3* 29.3*  MCV 78.5* 79.9* 81.4 81.2 79.2*  PLT 274 205 195 207 214   Basic Metabolic Panel: Recent Labs  Lab 11/15/22 0952 11/16/22 0500 11/17/22 0426 11/18/22 0951 11/19/22 0428  NA 136 136 138 138 139  K 3.5 3.1* 3.4* 3.6 3.3*  CL 104 105 108 109 107  CO2 20* 23 22 22 25   GLUCOSE 113* 105* 84 88 89  BUN 13 10 8 6 8   CREATININE 1.03* 1.00 0.99 0.97 0.99  CALCIUM 8.6* 8.1* 7.7* 8.0* 8.2*  MG  --   --   --  1.7 1.7  PHOS  --   --   --  2.9 3.7   Liver Function Tests: Recent Labs  Lab 11/15/22 0952  AST 24  ALT 17  ALKPHOS 58  BILITOT 0.9  PROT 6.9  ALBUMIN 3.5   Recent Labs  Lab 11/15/22 0952  LIPASE 21   No results for input(s): "AMMONIA" in the last 168 hours. Cardiac Enzymes: No results for input(s): "CKTOTAL", "CKMB", "CKMBINDEX", "TROPONINI" in the last 168 hours. BNP (last 3 results) No results for input(s): "BNP" in the last 8760 hours. CBG: No results for input(s): "GLUCAP" in the last 168 hours.  Time spent: 35 minutes  Signed:  Gillis Santa  [facility] 11/19/2022 11:04 AM

## 2022-11-20 ENCOUNTER — Telehealth: Payer: Self-pay

## 2022-11-20 ENCOUNTER — Other Ambulatory Visit: Payer: Self-pay | Admitting: Family Medicine

## 2022-11-20 DIAGNOSIS — R110 Nausea: Secondary | ICD-10-CM

## 2022-11-20 NOTE — Transitions of Care (Post Inpatient/ED Visit) (Unsigned)
11/20/2022  Name: Marilyn Rivera MRN: [id number] DOB: Apr 18, 1980  Today's TOC FU Call Status: Today's TOC FU Call Status:: Successful TOC FU Call Completed TOC FU Call Complete Date: 11/20/22 Patient's Name and Date of Birth confirmed.  Transition Care Management Follow-up Telephone Call Date of Discharge: 11/19/22 [facility] Type of Discharge: Inpatient Admission Primary Inpatient Discharge Diagnosis:: Colitis How have you been since you were released from the hospital?: Same Any questions or concerns?: No  Items Reviewed: Did you receive and understand the discharge instructions provided?: Yes Medications obtained,verified, and reconciled?: Yes (Medications Reviewed) Any new allergies since your discharge?: No Dietary orders reviewed?: Yes Type of Diet Ordered:: Cardiac and Carb Modified Do you have support at home?: Yes  Medications Reviewed Today: Medications Reviewed Today     Reviewed by Anthoney Harada, LPN (Licensed Practical Nurse) on 11/20/22 at 1348  Med List Status: <None>   Medication Order Taking? Sig Documenting Provider Last Dose Status Informant  amLODipine (NORVASC) 5 MG tablet [id number] Yes Take 1 tablet (5 mg total) by mouth daily. [name], DO Taking Active Self, Pharmacy Records  amoxicillin-clavulanate (AUGMENTIN) 875-125 MG tablet [id number] Yes Take 1 tablet by mouth 2 (two) times daily for 5 days. Gillis Santa, MD Taking Active   ascorbic acid (VITAMIN C) 500 MG tablet [id number] Yes Take 1 tablet (500 mg total) by mouth daily. Gillis Santa, MD Taking Active   cyanocobalamin 1000 MCG tablet [id number] Yes Take 1 tablet (1,000 mcg total) by mouth daily. Gillis Santa, MD Taking Active   cyclobenzaprine (FLEXERIL) 5 MG tablet [id number] Yes Take 1 tablet (5 mg total) by mouth 3 (three) times daily as needed for muscle spasms. [name], DO Taking Active Self, Pharmacy Records  EPINEPHrine 0.3 mg/0.3 mL  IJ SOAJ injection [id number] Yes Inject 0.3 mg into the muscle as needed for anaphylaxis (severe allergic reaction, then call 911). [name], DO Taking Active Self, Pharmacy Records  ferrous sulfate 325 (65 FE) MG tablet [id number] Yes Take 1 tablet (325 mg total) by mouth 2 (two) times daily with a meal. Gillis Santa, MD Taking Active   folic acid (FOLVITE) 1 MG tablet [id number] Yes Take 1 tablet (1 mg total) by mouth daily. Gillis Santa, MD Taking Active   glucose blood test strip [id number] Yes UAD to check sugar twice daily E11.649 (using accuchek) [name], DO Taking Active Self, Pharmacy Records  ibuprofen (ADVIL) 200 MG tablet [id number] Yes Take 2 tablets (400 mg total) by mouth every 8 (eight) hours as needed for moderate pain. Gillis Santa, MD Taking Active   Lancet Device MISC [id number] Yes UAD to check sugar twice daily E11.649 (using Accucheck machine) [name], DO Taking Active Self, Pharmacy Records  linaclotide [facility]) 145 MCG CAPS capsule [id number] Yes Take 1 capsule (145 mcg total) by mouth daily before breakfast.  Patient taking differently: Take 145 mcg by mouth daily as needed (Constipation).   [name], DO Taking Active Self, Pharmacy Records  montelukast (SINGULAIR) 10 MG tablet [id number] Yes Take 1 tablet (10 mg total) by mouth at bedtime. [name], DO Taking Active Self, Pharmacy Records  omeprazole (PRILOSEC) 40 MG capsule [id number] Yes Take 1 capsule (40 mg total) by mouth daily. [name], DO Taking Active Self, Pharmacy Records  ondansetron [facility]) 4 MG tablet [id number] Yes TAKE ONE TABLET EVERY 8 HOURS AS NEEDED FOR NAUSEA AND VOMITING [name], DO Taking Active Self, Pharmacy  Records  tirzepatide [facility]) 15 MG/0.5ML Pen [id number] Yes INJECT 15 MG SUBCUTANEOUSLY ONCE PER WEEK [name], DO Taking Active Self, Pharmacy Records            Home Care and Equipment/Supplies: Were  Home Health Services Ordered?: No Any new equipment or medical supplies ordered?: No  Functional Questionnaire: Do you need assistance with bathing/showering or dressing?: No Do you need assistance with meal preparation?: No Do you need assistance with eating?: No Do you have difficulty maintaining continence: No Do you need assistance with getting out of bed/getting out of a chair/moving?: No Do you have difficulty managing or taking your medications?: No  Follow up appointments reviewed: PCP Follow-up appointment confirmed?: No (pt. only wants to schedule with pcp and no other providers in the office) Specialist Hospital Follow-up appointment confirmed?: No Reason Specialist Follow-Up Not Confirmed: Patient has Specialist Provider Number and will Call for Appointment Do you need transportation to your follow-up appointment?: No Do you understand care options if your condition(s) worsen?: Yes-patient verbalized understanding    SIGNATURE Kandis Fantasia, LPN [facility] Health Advisor [facility] You Are. We Are. [facility] Direct Dial [phone]

## 2022-11-20 NOTE — Telephone Encounter (Signed)
The soonest I can accommodate a hosp follow up is 10/9 (there is a 46m acute slot with a charting slot right behind it, ok to schedule her there if she wants to wait that long)

## 2022-11-21 LAB — CULTURE, BLOOD (ROUTINE X 2)
Special Requests: ADEQUATE
Special Requests: ADEQUATE

## 2022-11-22 ENCOUNTER — Ambulatory Visit: Payer: Managed Care, Other (non HMO) | Admitting: Orthopedic Surgery

## 2022-11-22 NOTE — Telephone Encounter (Signed)
Kelci, please take care of this.  This was the slot that was taken up by the front that you blocked for this patient.

## 2022-11-26 ENCOUNTER — Encounter: Payer: Self-pay | Admitting: Family Medicine

## 2022-11-26 NOTE — Telephone Encounter (Signed)
Yes, DOD is the soonest we have for her

## 2022-12-06 ENCOUNTER — Ambulatory Visit: Payer: Managed Care, Other (non HMO) | Admitting: Family Medicine

## 2022-12-06 ENCOUNTER — Encounter: Payer: Self-pay | Admitting: Family Medicine

## 2022-12-06 NOTE — Progress Notes (Deleted)
Subjective: WJ:XBJYNWG PCP: [name], DO [id number] Marilyn Rivera is a 42 y.o. female presenting to clinic today for:  1. Hospital discharge follow up Colitis She was admitted with colitis.  They instructed her to follow-up for colonoscopy within 6 to 8 weeks of discharge.  Repeat anemia profile was recommended as her hemoglobin was 9.1 at discharge.  Noted to be iron deficient as well as low B12.  *** ROS: Per HPI  Allergies  Allergen Reactions   Keflex [Cephalexin]     Yeast infections    Coconut Fatty Acid Itching and Rash   Past Medical History:  Diagnosis Date   Arthritis    Diverticulitis large intestine w/o perforation or abscess w/o bleeding 10/05/2014   Diverticulosis    GERD (gastroesophageal reflux disease)    history of H Pylori   Helicobacter pylori gastritis 09/06/2014   History of kidney stones    Iron deficiency anemia    Kidney stones     Current Outpatient Medications:    amLODipine (NORVASC) 5 MG tablet, Take 1 tablet (5 mg total) by mouth daily., Disp: 90 tablet, Rfl: 3   ascorbic acid (VITAMIN C) 500 MG tablet, Take 1 tablet (500 mg total) by mouth daily., Disp: 90 tablet, Rfl: 0   cyanocobalamin 1000 MCG tablet, Take 1 tablet (1,000 mcg total) by mouth daily., Disp: 90 tablet, Rfl: 0   cyclobenzaprine (FLEXERIL) 5 MG tablet, Take 1 tablet (5 mg total) by mouth 3 (three) times daily as needed for muscle spasms., Disp: 90 tablet, Rfl: 1   EPINEPHrine 0.3 mg/0.3 mL IJ SOAJ injection, Inject 0.3 mg into the muscle as needed for anaphylaxis (severe allergic reaction, then call 911)., Disp: 1 each, Rfl: 0   ferrous sulfate 325 (65 FE) MG tablet, Take 1 tablet (325 mg total) by mouth 2 (two) times daily with a meal., Disp: 180 tablet, Rfl: 0   folic acid (FOLVITE) 1 MG tablet, Take 1 tablet (1 mg total) by mouth daily., Disp: 90 tablet, Rfl: 0   glucose blood test strip, UAD to check sugar twice daily E11.649 (using accuchek), Disp: 100 each, Rfl:  12   ibuprofen (ADVIL) 200 MG tablet, Take 2 tablets (400 mg total) by mouth every 8 (eight) hours as needed for moderate pain., Disp: , Rfl:    Lancet Device MISC, UAD to check sugar twice daily E11.649 (using Accucheck machine), Disp: 100 each, Rfl: 12   linaclotide (LINZESS) 145 MCG CAPS capsule, Take 1 capsule (145 mcg total) by mouth daily before breakfast. (Patient taking differently: Take 145 mcg by mouth daily as needed (Constipation).), Disp: 90 capsule, Rfl: 3   montelukast (SINGULAIR) 10 MG tablet, Take 1 tablet (10 mg total) by mouth at bedtime., Disp: 90 tablet, Rfl: 3   omeprazole (PRILOSEC) 40 MG capsule, Take 1 capsule (40 mg total) by mouth daily., Disp: 90 capsule, Rfl: 1   ondansetron (ZOFRAN) 4 MG tablet, TAKE ONE TABLET EVERY 8 HOURS AS NEEDED FOR NAUSEA AND VOMITING, Disp: 20 tablet, Rfl: 1   tirzepatide (MOUNJARO) 15 MG/0.5ML Pen, INJECT 15 MG SUBCUTANEOUSLY ONCE PER WEEK, Disp: 6 mL, Rfl: 3 Social History   Socioeconomic History   Marital status: Divorced    Spouse name: Not on file   Number of children: 0   Years of education: Not on file   Highest education level: Associate degree: occupational, Scientist, product/process development, or vocational program  Occupational History   Occupation: Estate agent, Nurse, adult for resource side  Tobacco Use   Smoking  status: Never   Smokeless tobacco: Never  Vaping Use   Vaping status: Never Used  Substance and Sexual Activity   Alcohol use: No   Drug use: No   Sexual activity: Not on file  Other Topics Concern   Not on file  Social History Narrative   Not on file   Social Determinants of Health   Financial Resource Strain: Low Risk  (12/05/2022)   Overall Financial Resource Strain (CARDIA)    Difficulty of Paying Living Expenses: Not very hard  Food Insecurity: No Food Insecurity (12/05/2022)   Hunger Vital Sign    Worried About Running Out of Food in the Last Year: Never true    Ran Out of Food in the Last Year: Never true  Transportation  Needs: No Transportation Needs (12/05/2022)   PRAPARE - Administrator, Civil Service (Medical): No    Lack of Transportation (Non-Medical): No  Physical Activity: Insufficiently Active (12/05/2022)   Exercise Vital Sign    Days of Exercise per Week: 2 days    Minutes of Exercise per Session: 30 min  Stress: No Stress Concern Present (12/05/2022)   [employer] of Occupational Health - Occupational Stress Questionnaire    Feeling of Stress : Only a little  Recent Concern: Stress - Stress Concern Present (09/09/2022)   [employer] of Occupational Health - Occupational Stress Questionnaire    Feeling of Stress : To some extent  Social Connections: Moderately Integrated (12/05/2022)   Social Connection and Isolation Panel [NHANES]    Frequency of Communication with Friends and Family: More than three times a week    Frequency of Social Gatherings with Friends and Family: More than three times a week    Attends Religious Services: More than 4 times per year    Active Member of [employer] or Organizations: Yes    Attends Engineer, structural: More than 4 times per year    Marital Status: Divorced  Intimate Partner Violence: Unknown (09/02/2021)   Received from [employer], [facility]   HITS    Physically Hurt: Not on file    Insult or Talk Down To: Not on file    Threaten Physical Harm: Not on file    Scream or Curse: Not on file   Family History  Problem Relation Age of Onset   Heart failure Mother    Hypertension Mother    Heart disease Mother    Cancer Other        mother's side of family, breast cancer   Cancer Other        father's side of family, leukemia   Hypertension Father    Stroke Father 28   Diabetes Other    Hypertension Other    Colon cancer Cousin    Healthy Brother    Breast cancer Maternal Aunt 28   Leukemia Paternal Aunt    Kidney disease Maternal Grandmother    Hypertension Maternal Grandmother    Cancer Maternal Grandfather     Leukemia Paternal Grandmother    Cancer Paternal Grandfather     Objective: Office vital signs reviewed. LMP 11/01/2022   Physical Examination:  General: Awake, alert, *** nourished, No acute distress HEENT: Normal    Neck: No masses palpated. No lymphadenopathy    Ears: Tympanic membranes intact, normal light reflex, no erythema, no bulging    Eyes: PERRLA, extraocular membranes intact, sclera ***    Nose: nasal turbinates moist, *** nasal discharge    Throat: moist mucus  membranes, no erythema, *** tonsillar exudate.  Airway is patent Cardio: regular rate and rhythm, S1S2 heard, no murmurs appreciated Pulm: clear to auscultation bilaterally, no wheezes, rhonchi or rales; normal work of breathing on room air GI: soft, non-tender, non-distended, bowel sounds present x4, no hepatomegaly, no splenomegaly, no masses GU: external vaginal tissue ***, cervix ***, *** punctate lesions on cervix appreciated, *** discharge from cervical os, *** bleeding, *** cervical motion tenderness, *** abdominal/ adnexal masses Extremities: warm, well perfused, No edema, cyanosis or clubbing; +*** pulses bilaterally MSK: *** gait and *** station Skin: dry; intact; no rashes or lesions Neuro: *** Strength and light touch sensation grossly intact, *** DTRs ***/4  Assessment/ Plan: 42 y.o. female   No diagnosis found.  ***   Angell Pincock Hulen Skains, DO [facility] [phone]

## 2022-12-09 ENCOUNTER — Encounter: Payer: Self-pay | Admitting: Family Medicine

## 2022-12-10 ENCOUNTER — Ambulatory Visit (INDEPENDENT_AMBULATORY_CARE_PROVIDER_SITE_OTHER): Payer: Managed Care, Other (non HMO) | Admitting: Family Medicine

## 2022-12-10 ENCOUNTER — Encounter: Payer: Self-pay | Admitting: Family Medicine

## 2022-12-10 VITALS — BP 152/98 | HR 74 | Temp 98.8°F | Ht 70.0 in | Wt 371.0 lb

## 2022-12-10 DIAGNOSIS — E119 Type 2 diabetes mellitus without complications: Secondary | ICD-10-CM | POA: Diagnosis not present

## 2022-12-10 DIAGNOSIS — K529 Noninfective gastroenteritis and colitis, unspecified: Secondary | ICD-10-CM

## 2022-12-10 DIAGNOSIS — I152 Hypertension secondary to endocrine disorders: Secondary | ICD-10-CM

## 2022-12-10 DIAGNOSIS — R110 Nausea: Secondary | ICD-10-CM

## 2022-12-10 DIAGNOSIS — Z9889 Other specified postprocedural states: Secondary | ICD-10-CM

## 2022-12-10 DIAGNOSIS — E876 Hypokalemia: Secondary | ICD-10-CM | POA: Diagnosis not present

## 2022-12-10 DIAGNOSIS — Z7985 Long-term (current) use of injectable non-insulin antidiabetic drugs: Secondary | ICD-10-CM

## 2022-12-10 DIAGNOSIS — F439 Reaction to severe stress, unspecified: Secondary | ICD-10-CM

## 2022-12-10 DIAGNOSIS — E1159 Type 2 diabetes mellitus with other circulatory complications: Secondary | ICD-10-CM

## 2022-12-10 DIAGNOSIS — E538 Deficiency of other specified B group vitamins: Secondary | ICD-10-CM | POA: Diagnosis not present

## 2022-12-10 LAB — BAYER DCA HB A1C WAIVED: HB A1C (BAYER DCA - WAIVED): 5.1 % (ref 4.8–5.6)

## 2022-12-10 MED ORDER — CYANOCOBALAMIN 1000 MCG/ML IJ SOLN
1000.0000 ug | INTRAMUSCULAR | Status: AC
Start: 2022-12-10 — End: 2023-01-07
  Administered 2022-12-18 – 2023-01-02 (×3): 1000 ug via INTRAMUSCULAR

## 2022-12-10 MED ORDER — MONTELUKAST SODIUM 10 MG PO TABS: 10.0000 mg | ORAL_TABLET | Freq: Every day | ORAL | 3 refills | Status: DC

## 2022-12-10 MED ORDER — ONDANSETRON HCL 4 MG PO TABS
ORAL_TABLET | ORAL | 1 refills | Status: DC
Start: 2022-12-10 — End: 2023-03-28

## 2022-12-10 MED ORDER — TRAZODONE HCL 50 MG PO TABS
25.0000 mg | ORAL_TABLET | Freq: Every evening | ORAL | 3 refills | Status: AC | PRN
Start: 2022-12-10 — End: ?

## 2022-12-10 MED ORDER — AMLODIPINE BESYLATE 5 MG PO TABS
5.0000 mg | ORAL_TABLET | Freq: Every day | ORAL | 3 refills | Status: DC
Start: 2022-12-10 — End: 2023-10-21

## 2022-12-10 MED ORDER — OMEPRAZOLE 40 MG PO CPDR: 40.0000 mg | DELAYED_RELEASE_CAPSULE | Freq: Every day | ORAL | 3 refills | Status: DC

## 2022-12-10 NOTE — Progress Notes (Signed)
Subjective: ZO:XWRUEAVW follow up PCP: [name], DO UJW:JXBJYN Panda Crossin is a 42 y.o. female presenting to clinic today for:  1. Recurrent colitis/ constipation Had recurrent colitis which resulted in hospitalization.  She reports ongoing issues with constipation.  Linzess causes quite a flare up of abdominal pain. No blood in stool. Used to see Dr Darrick Penna but now retired. Has not seen GI.  Needs to establish care.  Has ongoing issues with nausea.  Just resumed GIP about 1 week ago.  Miralax being used every other day because daily use causes diarrhea.  Still having pellet/ stools with every other day miralax.  2. B12 deficiency/ stress Noted to have B12 deficiency, Folic acid deficiency, and iron deficiency during hospitalization. Started on folic acid, otc B12 and iron.  Taking all as directed. Continues to have low energy.  Having a lot of stress. Notes poor sleep due to anxiety surrounding health, finances (she lost her vehicle recently).   ROS: Per HPI  Allergies  Allergen Reactions   Keflex [Cephalexin]     Yeast infections    Coconut Fatty Acid Itching and Rash   Past Medical History:  Diagnosis Date   Arthritis    Diverticulitis large intestine w/o perforation or abscess w/o bleeding 10/05/2014   Diverticulosis    GERD (gastroesophageal reflux disease)    history of H Pylori   Helicobacter pylori gastritis 09/06/2014   History of kidney stones    Iron deficiency anemia    Kidney stones     Current Outpatient Medications:    amLODipine (NORVASC) 5 MG tablet, Take 1 tablet (5 mg total) by mouth daily., Disp: 90 tablet, Rfl: 3   ascorbic acid (VITAMIN C) 500 MG tablet, Take 1 tablet (500 mg total) by mouth daily., Disp: 90 tablet, Rfl: 0   cyanocobalamin 1000 MCG tablet, Take 1 tablet (1,000 mcg total) by mouth daily., Disp: 90 tablet, Rfl: 0   cyclobenzaprine (FLEXERIL) 5 MG tablet, Take 1 tablet (5 mg total) by mouth 3 (three) times daily as needed for  muscle spasms., Disp: 90 tablet, Rfl: 1   EPINEPHrine 0.3 mg/0.3 mL IJ SOAJ injection, Inject 0.3 mg into the muscle as needed for anaphylaxis (severe allergic reaction, then call 911)., Disp: 1 each, Rfl: 0   ferrous sulfate 325 (65 FE) MG tablet, Take 1 tablet (325 mg total) by mouth 2 (two) times daily with a meal., Disp: 180 tablet, Rfl: 0   folic acid (FOLVITE) 1 MG tablet, Take 1 tablet (1 mg total) by mouth daily., Disp: 90 tablet, Rfl: 0   glucose blood test strip, UAD to check sugar twice daily E11.649 (using accuchek), Disp: 100 each, Rfl: 12   ibuprofen (ADVIL) 200 MG tablet, Take 2 tablets (400 mg total) by mouth every 8 (eight) hours as needed for moderate pain., Disp: , Rfl:    Lancet Device MISC, UAD to check sugar twice daily E11.649 (using Accucheck machine), Disp: 100 each, Rfl: 12   linaclotide (LINZESS) 145 MCG CAPS capsule, Take 1 capsule (145 mcg total) by mouth daily before breakfast. (Patient taking differently: Take 145 mcg by mouth daily as needed (Constipation).), Disp: 90 capsule, Rfl: 3   montelukast (SINGULAIR) 10 MG tablet, Take 1 tablet (10 mg total) by mouth at bedtime., Disp: 90 tablet, Rfl: 3   omeprazole (PRILOSEC) 40 MG capsule, Take 1 capsule (40 mg total) by mouth daily., Disp: 90 capsule, Rfl: 1   ondansetron (ZOFRAN) 4 MG tablet, TAKE ONE TABLET EVERY 8  HOURS AS NEEDED FOR NAUSEA AND VOMITING, Disp: 20 tablet, Rfl: 1   tirzepatide (MOUNJARO) 15 MG/0.5ML Pen, INJECT 15 MG SUBCUTANEOUSLY ONCE PER WEEK, Disp: 6 mL, Rfl: 3 Social History   Socioeconomic History   Marital status: Divorced    Spouse name: Not on file   Number of children: 0   Years of education: Not on file   Highest education level: Associate degree: occupational, Scientist, product/process development, or vocational program  Occupational History   Occupation: Estate agent, Nurse, adult for resource side  Tobacco Use   Smoking status: Never   Smokeless tobacco: Never  Vaping Use   Vaping status: Never Used  Substance and  Sexual Activity   Alcohol use: No   Drug use: No   Sexual activity: Not on file  Other Topics Concern   Not on file  Social History Narrative   Not on file   Social Determinants of Health   Financial Resource Strain: Low Risk  (12/05/2022)   Overall Financial Resource Strain (CARDIA)    Difficulty of Paying Living Expenses: Not very hard  Food Insecurity: No Food Insecurity (12/05/2022)   Hunger Vital Sign    Worried About Running Out of Food in the Last Year: Never true    Ran Out of Food in the Last Year: Never true  Transportation Needs: No Transportation Needs (12/05/2022)   PRAPARE - Administrator, Civil Service (Medical): No    Lack of Transportation (Non-Medical): No  Physical Activity: Insufficiently Active (12/05/2022)   Exercise Vital Sign    Days of Exercise per Week: 2 days    Minutes of Exercise per Session: 30 min  Stress: No Stress Concern Present (12/05/2022)   [employer] of Occupational Health - Occupational Stress Questionnaire    Feeling of Stress : Only a little  Recent Concern: Stress - Stress Concern Present (09/09/2022)   [employer] of Occupational Health - Occupational Stress Questionnaire    Feeling of Stress : To some extent  Social Connections: Moderately Integrated (12/05/2022)   Social Connection and Isolation Panel [NHANES]    Frequency of Communication with Friends and Family: More than three times a week    Frequency of Social Gatherings with Friends and Family: More than three times a week    Attends Religious Services: More than 4 times per year    Active Member of [employer] or Organizations: Yes    Attends Engineer, structural: More than 4 times per year    Marital Status: Divorced  Intimate Partner Violence: Unknown (09/02/2021)   Received from [employer], [facility]   HITS    Physically Hurt: Not on file    Insult or Talk Down To: Not on file    Threaten Physical Harm: Not on file    Scream or Curse:  Not on file   Family History  Problem Relation Age of Onset   Heart failure Mother    Hypertension Mother    Heart disease Mother    Cancer Other        mother's side of family, breast cancer   Cancer Other        father's side of family, leukemia   Hypertension Father    Stroke Father 90   Diabetes Other    Hypertension Other    Colon cancer Cousin    Healthy Brother    Breast cancer Maternal Aunt 49   Leukemia Paternal Aunt    Kidney disease Maternal Grandmother    Hypertension Maternal Grandmother  Cancer Maternal Grandfather    Leukemia Paternal Grandmother    Cancer Paternal Grandfather     Objective: Office vital signs reviewed. BP (!) 171/101   Pulse 74   Temp 98.8 F (37.1 C)   Ht 5\' 10"  (1.778 m)   Wt (!) 371 lb (168.3 kg)   LMP 11/01/2022   SpO2 93%   BMI 53.23 kg/m   Physical Examination:  General: Awake, alert, morbidly obese, No acute distress HEENT: sclera white, MMM Cardio: regular rate and rhythm, S1S2 heard, no murmurs appreciated Pulm: clear to auscultation bilaterally, no wheezes, rhonchi or rales; normal work of breathing on room air GI: Obese, soft, epigastric tenderness and left sided tenderness present.  No guarding or rebound, non-distended, bowel sounds present x4, no hepatomegaly, no splenomegaly, no masses Psych: mood anxious     12/10/2022   12:14 PM 10/15/2022   11:12 AM 09/10/2022   10:00 AM  Depression screen PHQ 2/9  Decreased Interest 0 0 0  Down, Depressed, Hopeless 0 0 0  PHQ - 2 Score 0 0 0  Altered sleeping 0 0 0  Tired, decreased energy 0 0 0  Change in appetite 0 0 0  Feeling bad or failure about yourself  0 0 0  Trouble concentrating 0 0 0  Moving slowly or fidgety/restless 0 0 0  Suicidal thoughts 0 0 0  PHQ-9 Score 0 0 0  Difficult doing work/chores Not difficult at all  Not difficult at all      12/10/2022   12:15 PM 10/15/2022   11:12 AM 05/29/2022    1:43 PM 02/12/2022   12:32 PM  GAD 7 : Generalized  Anxiety Score  Nervous, Anxious, on Edge 0 0 0 0  Control/stop worrying 0 0 0 0  Worry too much - different things 0 0 0 0  Trouble relaxing 0 0 0 0  Restless 0 0 0 0  Easily annoyed or irritable 0 0 0 0  Afraid - awful might happen 0 0 0 0  Total GAD 7 Score 0 0 0 0  Anxiety Difficulty Not difficult at all Not difficult at all  Not difficult at all    Assessment/ Plan: 42 y.o. female   Hypokalemia - Plan: BMP8+EGFR, Magnesium  Vitamin B12 deficiency - Plan: CBC, cyanocobalamin (VITAMIN B12) injection 1,000 mcg  Hypertension associated with diabetes (HCC) - Plan: BMP8+EGFR, Magnesium, amLODipine (NORVASC) 5 MG tablet  Diabetes mellitus treated with injections of non-insulin medication (HCC) - Plan: Bayer DCA Hb A1c Waived  Nausea - Plan: ondansetron (ZOFRAN) 4 MG tablet  Colitis - Plan: Ambulatory referral to Gastroenterology  History of colon surgery - Plan: Ambulatory referral to Gastroenterology  Stress - Plan: traZODone (DESYREL) 50 MG tablet  Check BMP, magnesium level.  Check CBC given anemia and B12 deficiency.  B12 injection was administered during today's visit.  Okay to come in weekly for B12 injections  Blood pressure was uncontrolled during today's visit.  Would like repeat in 1 week when she comes back for B12 injection  Check A1c.  Needs updated foot exam  Zofran renewed.  Referral to gastroenterology placed.  Offered lactulose daily if needed for constipation but is going to try half dose of MiraLAX daily first.  Linzess too stimulating  Stress high. Declines intervention at this time. Trazodone renewed.  [name], DO [facility] [phone]

## 2022-12-11 LAB — CBC
Hematocrit: 36.9 % (ref 34.0–46.6)
Hemoglobin: 11.1 g/dL (ref 11.1–15.9)
MCH: 25.1 pg — ABNORMAL LOW (ref 26.6–33.0)
MCHC: 30.1 g/dL — ABNORMAL LOW (ref 31.5–35.7)
MCV: 84 fL (ref 79–97)
Platelets: 273 10*3/uL (ref 150–450)
RBC: 4.42 x10E6/uL (ref 3.77–5.28)
RDW: 19.6 % — ABNORMAL HIGH (ref 11.7–15.4)
WBC: 6.4 10*3/uL (ref 3.4–10.8)

## 2022-12-11 LAB — BMP8+EGFR
BUN/Creatinine Ratio: 15 (ref 9–23)
BUN: 13 mg/dL (ref 6–24)
CO2: 21 mmol/L (ref 20–29)
Calcium: 8.7 mg/dL (ref 8.7–10.2)
Chloride: 107 mmol/L — ABNORMAL HIGH (ref 96–106)
Creatinine, Ser: 0.87 mg/dL (ref 0.57–1.00)
Glucose: 85 mg/dL (ref 70–99)
Potassium: 4.1 mmol/L (ref 3.5–5.2)
Sodium: 141 mmol/L (ref 134–144)
eGFR: 85 mL/min/{1.73_m2} (ref >59)

## 2022-12-11 LAB — MAGNESIUM: Magnesium: 1.8 mg/dL (ref 1.6–2.3)

## 2022-12-17 ENCOUNTER — Ambulatory Visit: Payer: Managed Care, Other (non HMO)

## 2022-12-18 ENCOUNTER — Ambulatory Visit (INDEPENDENT_AMBULATORY_CARE_PROVIDER_SITE_OTHER): Payer: Managed Care, Other (non HMO) | Admitting: *Deleted

## 2022-12-18 ENCOUNTER — Ambulatory Visit: Payer: Managed Care, Other (non HMO)

## 2022-12-18 DIAGNOSIS — E538 Deficiency of other specified B group vitamins: Secondary | ICD-10-CM

## 2022-12-18 NOTE — Progress Notes (Signed)
Pt given B12 injection IM right deltoid and tolerated well. 

## 2022-12-20 ENCOUNTER — Telehealth: Payer: Self-pay | Admitting: Gastroenterology

## 2022-12-20 NOTE — Telephone Encounter (Signed)
Good morning Dr. Tomasa Rand,    Supervising Provider 12/20/2022 AM   We received a referral for patient for colitis and history of colon surgery. Patient has prior history with [facility] and last had a colonoscopy in 2016. Patient is requesting to transfer her care due to her previous provider leaving the practice. Patient's previous records are in [facility] for you to review and advise on scheduling.   Thank you.

## 2022-12-25 ENCOUNTER — Ambulatory Visit (INDEPENDENT_AMBULATORY_CARE_PROVIDER_SITE_OTHER): Payer: Managed Care, Other (non HMO) | Admitting: *Deleted

## 2022-12-25 DIAGNOSIS — E538 Deficiency of other specified B group vitamins: Secondary | ICD-10-CM | POA: Diagnosis not present

## 2022-12-25 NOTE — Progress Notes (Signed)
Patient in today for weekly B 12 injection. Tolerated well.

## 2022-12-25 NOTE — Telephone Encounter (Signed)
Called patient to schedule office visit. Left voicemail.

## 2023-01-01 ENCOUNTER — Ambulatory Visit: Payer: Managed Care, Other (non HMO)

## 2023-01-02 ENCOUNTER — Ambulatory Visit (INDEPENDENT_AMBULATORY_CARE_PROVIDER_SITE_OTHER): Payer: Self-pay

## 2023-01-02 DIAGNOSIS — E538 Deficiency of other specified B group vitamins: Secondary | ICD-10-CM | POA: Diagnosis not present

## 2023-01-02 NOTE — Progress Notes (Signed)
B12 injection given to patient and tolerated well in right deltoid.

## 2023-01-09 ENCOUNTER — Ambulatory Visit (INDEPENDENT_AMBULATORY_CARE_PROVIDER_SITE_OTHER): Payer: Self-pay

## 2023-01-09 DIAGNOSIS — E538 Deficiency of other specified B group vitamins: Secondary | ICD-10-CM

## 2023-01-09 MED ORDER — CYANOCOBALAMIN 1000 MCG/ML IJ SOLN
1000.0000 ug | INTRAMUSCULAR | Status: AC
Start: 2023-01-09 — End: 2024-01-04
  Administered 2023-01-09 – 2023-03-06 (×2): 1000 ug via INTRAMUSCULAR

## 2023-01-09 NOTE — Progress Notes (Signed)
Cyanocobalamin injection given to right deltoid.  Patient tolerated well. 

## 2023-02-07 ENCOUNTER — Ambulatory Visit: Payer: Managed Care, Other (non HMO)

## 2023-02-10 ENCOUNTER — Ambulatory Visit: Payer: Self-pay | Admitting: Family Medicine

## 2023-02-10 ENCOUNTER — Ambulatory Visit: Payer: Managed Care, Other (non HMO)

## 2023-02-17 ENCOUNTER — Ambulatory Visit: Payer: Managed Care, Other (non HMO)

## 2023-02-28 ENCOUNTER — Ambulatory Visit: Payer: Managed Care, Other (non HMO)

## 2023-03-03 ENCOUNTER — Ambulatory Visit: Payer: Self-pay | Admitting: Family Medicine

## 2023-03-05 ENCOUNTER — Encounter: Payer: Self-pay | Admitting: Family Medicine

## 2023-03-05 ENCOUNTER — Ambulatory Visit (INDEPENDENT_AMBULATORY_CARE_PROVIDER_SITE_OTHER): Payer: Self-pay | Admitting: Family Medicine

## 2023-03-05 VITALS — BP 159/89 | HR 61 | Temp 97.9°F | Ht 70.0 in | Wt 375.0 lb

## 2023-03-05 DIAGNOSIS — E538 Deficiency of other specified B group vitamins: Secondary | ICD-10-CM | POA: Diagnosis not present

## 2023-03-05 DIAGNOSIS — M1712 Unilateral primary osteoarthritis, left knee: Secondary | ICD-10-CM | POA: Insufficient documentation

## 2023-03-05 DIAGNOSIS — Z7985 Long-term (current) use of injectable non-insulin antidiabetic drugs: Secondary | ICD-10-CM | POA: Diagnosis not present

## 2023-03-05 DIAGNOSIS — E1159 Type 2 diabetes mellitus with other circulatory complications: Secondary | ICD-10-CM

## 2023-03-05 DIAGNOSIS — M1711 Unilateral primary osteoarthritis, right knee: Secondary | ICD-10-CM

## 2023-03-05 DIAGNOSIS — E119 Type 2 diabetes mellitus without complications: Secondary | ICD-10-CM | POA: Diagnosis not present

## 2023-03-05 DIAGNOSIS — Z1159 Encounter for screening for other viral diseases: Secondary | ICD-10-CM

## 2023-03-05 DIAGNOSIS — I152 Hypertension secondary to endocrine disorders: Secondary | ICD-10-CM

## 2023-03-05 LAB — BAYER DCA HB A1C WAIVED: HB A1C (BAYER DCA - WAIVED): 5.6 % (ref 4.8–5.6)

## 2023-03-05 MED ORDER — TIRZEPATIDE 12.5 MG/0.5ML ~~LOC~~ SOAJ
12.5000 mg | SUBCUTANEOUS | 0 refills | Status: DC
Start: 2023-03-05 — End: 2023-04-17

## 2023-03-05 MED ORDER — TIRZEPATIDE 15 MG/0.5ML ~~LOC~~ SOAJ
15.0000 mg | SUBCUTANEOUS | 99 refills | Status: DC
Start: 2023-03-05 — End: 2023-12-17

## 2023-03-05 MED ORDER — TIRZEPATIDE 10 MG/0.5ML ~~LOC~~ SOAJ
10.0000 mg | SUBCUTANEOUS | 0 refills | Status: DC
Start: 2023-03-05 — End: 2023-09-19

## 2023-03-05 NOTE — Progress Notes (Signed)
 Subjective: CC:DM PCP: Marilyn Marilyn HERO, DO YEP:Wprnoz Ski Marilyn Rivera is a 43 y.o. female presenting to clinic today for:  1. Type 2 Diabetes with hypertension:  She reports that she is been out of Mounjaro  for the last couple of months because she lost her insurance.  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  She has been compliant with her blood pressure medication.  Diabetes Health Maintenance Due  Topic Date Due   FOOT EXAM  11/29/2022   HEMOGLOBIN A1C  06/10/2023   OPHTHALMOLOGY EXAM  10/11/2023    Last A1c:  Lab Results  Component Value Date   HGBA1C 5.1 12/10/2022    ROS: No dizziness, LOC, polyuria, polydipsia, unintended weight loss/gain, foot ulcerations, numbness or tingling in extremities, shortness of breath or chest pain.  2.  Osteoarthritis Continues to have bilateral knee pain.  Needs referral back to specialist in [facility] for management.  She has had some weight gain as above due to lapse in medication.  Hoping to get some of this weight off again  3.  B12 deficiency Would like to get a B12 shot today.  She reports low energy.  Low mood. ROS: Per HPI  Allergies  Allergen Reactions   Keflex  [Cephalexin ]     Yeast infections    Coconut Fatty Acid Itching and Rash   Past Medical History:  Diagnosis Date   Arthritis    Diverticulitis large intestine w/o perforation or abscess w/o bleeding 10/05/2014   Diverticulosis    GERD (gastroesophageal reflux disease)    history of H Pylori   Helicobacter pylori gastritis 09/06/2014   History of kidney stones    Iron deficiency anemia    Kidney stones     Current Outpatient Medications:    amLODipine  (NORVASC ) 5 MG tablet, Take 1 tablet (5 mg total) by mouth daily., Disp: 90 tablet, Rfl: 3   cyclobenzaprine  (FLEXERIL ) 5 MG tablet, Take 1 tablet (5 mg total) by mouth 3 (three) times daily as needed for muscle spasms., Disp: 90 tablet, Rfl: 1    EPINEPHrine  0.3 mg/0.3 mL IJ SOAJ injection, Inject 0.3 mg into the muscle as needed for anaphylaxis (severe allergic reaction, then call 911)., Disp: 1 each, Rfl: 0   ferrous sulfate  325 (65 FE) MG tablet, Take 1 tablet (325 mg total) by mouth 2 (two) times daily with a meal., Disp: 180 tablet, Rfl: 0   glucose blood test strip, UAD to check sugar twice daily E11.649 (using accuchek), Disp: 100 each, Rfl: 12   ibuprofen  (ADVIL ) 200 MG tablet, Take 2 tablets (400 mg total) by mouth every 8 (eight) hours as needed for moderate pain., Disp: , Rfl:    Lancet Device MISC, UAD to check sugar twice daily E11.649 (using Accucheck machine), Disp: 100 each, Rfl: 12   montelukast  (SINGULAIR ) 10 MG tablet, Take 1 tablet (10 mg total) by mouth at bedtime., Disp: 90 tablet, Rfl: 3   omeprazole  (PRILOSEC) 40 MG capsule, Take 1 capsule (40 mg total) by mouth daily., Disp: 90 capsule, Rfl: 3   ondansetron  (ZOFRAN ) 4 MG tablet, TAKE ONE TABLET EVERY 8 HOURS AS NEEDED FOR NAUSEA AND VOMITING, Disp: 30 tablet, Rfl: 1   tirzepatide  (MOUNJARO ) 15 MG/0.5ML Pen, INJECT 15 MG SUBCUTANEOUSLY ONCE PER WEEK, Disp: 6 mL, Rfl: 3   traZODone  (DESYREL ) 50 MG tablet, Take 0.5-1 tablets (25-50 mg total) by mouth at bedtime as needed for sleep., Disp: 30 tablet, Rfl: 3  Current Facility-Administered  Medications:    cyanocobalamin  (VITAMIN B12) injection 1,000 mcg, 1,000 mcg, Intramuscular, Q30 days, Marilyn Granville M, DO, 1,000 mcg at 01/09/23 1028 Social History   Socioeconomic History   Marital status: Divorced    Spouse name: Not on file   Number of children: 0   Years of education: Not on file   Highest education level: Some college, no degree  Occupational History   Occupation: Estate Agent, nurse, adult for resource side  Tobacco Use   Smoking status: Never   Smokeless tobacco: Never  Vaping Use   Vaping status: Never Used  Substance and Sexual Activity   Alcohol use: No   Drug use: No   Sexual activity: Not on file   Other Topics Concern   Not on file  Social History Narrative   Not on file   Social Drivers of Health   Financial Resource Strain: Low Risk  (02/08/2023)   Overall Financial Resource Strain (CARDIA)    Difficulty of Paying Living Expenses: Not hard at all  Food Insecurity: No Food Insecurity (02/08/2023)   Hunger Vital Sign    Worried About Running Out of Food in the Last Year: Never true    Ran Out of Food in the Last Year: Never true  Transportation Needs: No Transportation Needs (02/08/2023)   PRAPARE - Administrator, Civil Service (Medical): No    Lack of Transportation (Non-Medical): No  Physical Activity: Inactive (02/08/2023)   Exercise Vital Sign    Days of Exercise per Week: 0 days    Minutes of Exercise per Session: 30 min  Stress: No Stress Concern Present (02/08/2023)   [employer] of Occupational Health - Occupational Stress Questionnaire    Feeling of Stress : Only a little  Social Connections: Moderately Integrated (02/08/2023)   Social Connection and Isolation Panel [NHANES]    Frequency of Communication with Friends and Family: More than three times a week    Frequency of Social Gatherings with Friends and Family: Three times a week    Attends Religious Services: More than 4 times per year    Active Member of Clubs or Organizations: Yes    Attends Banker Meetings: More than 4 times per year    Marital Status: Divorced  Intimate Partner Violence: Unknown (09/02/2021)   Received from [employer], [facility]   HITS    Physically Hurt: Not on file    Insult or Talk Down To: Not on file    Threaten Physical Harm: Not on file    Scream or Curse: Not on file   Family History  Problem Relation Age of Onset   Heart failure Mother    Hypertension Mother    Heart disease Mother    Cancer Other        mother's side of family, breast cancer   Cancer Other        father's side of family, leukemia   Hypertension Father     Stroke Father 53   Diabetes Other    Hypertension Other    Colon cancer Cousin    Healthy Brother    Breast cancer Maternal Aunt 73   Leukemia Paternal Aunt    Kidney disease Maternal Grandmother    Hypertension Maternal Grandmother    Cancer Maternal Grandfather    Leukemia Paternal Grandmother    Cancer Paternal Grandfather     Objective: Office vital signs reviewed. BP (!) 159/89 Comment: has not taken bp meds today  Pulse 61  Temp 97.9 F (36.6 C)   Ht 5' 10 (1.778 Rivera)   Wt (!) 375 lb (170.1 kg)   SpO2 95%   BMI 53.81 kg/Rivera   Physical Examination:  General: Awake, alert, obese, No acute distress HEENT: sclera white, MMM Cardio: regular rate and rhythm, S1S2 heard, no murmurs appreciated Pulm: clear to auscultation bilaterally, no wheezes, rhonchi or rales; normal work of breathing on room air MSK: Ambulating independently but gait is antalgic   Assessment/ Plan: 43 y.o. female   Diabetes mellitus treated with injections of non-insulin  medication (HCC) - Plan: Bayer DCA Hb A1c Waived, tirzepatide  (MOUNJARO ) 10 MG/0.5ML Pen, tirzepatide  (MOUNJARO ) 12.5 MG/0.5ML Pen, tirzepatide  (MOUNJARO ) 15 MG/0.5ML Pen  Hypertension associated with diabetes (HCC) - Plan: Basic Metabolic Panel  Morbid obesity (HCC)  Vitamin B12 deficiency - Plan: Vitamin B12  Encounter for hepatitis C screening test for low risk patient - Plan: Hepatitis C antibody  Primary osteoarthritis of right knee - Plan: Ambulatory referral to Orthopedic Surgery  Primary osteoarthritis of left knee - Plan: Ambulatory referral to Orthopedic Surgery  Sugar control despite lapse in medication.  I have started her back at a lower dose to reduce risk of GI disturbance and we will titrate up to 15 mg if tolerated.  She will continue the remainder of her medications as prescribed.  Blood pressure is not at goal during today's visit.  I will have her rechecked in the next few weeks.    B12 shot was administered  today  Referral back to Dr Anderson for OA of knees.  Last corticosteroid injection was 09/10/2022.  Incidentally complained little bit of forearm pain but I think this to be a tendinitis due to recent activity.  Discussed banding, ice and oral analgesics.  Marilyn CHRISTELLA Fielding, DO [facility] [phone]

## 2023-03-06 ENCOUNTER — Ambulatory Visit (INDEPENDENT_AMBULATORY_CARE_PROVIDER_SITE_OTHER): Payer: Medicaid Other

## 2023-03-06 ENCOUNTER — Telehealth: Payer: Self-pay

## 2023-03-06 DIAGNOSIS — E538 Deficiency of other specified B group vitamins: Secondary | ICD-10-CM

## 2023-03-06 LAB — BASIC METABOLIC PANEL
BUN/Creatinine Ratio: 13 (ref 9–23)
BUN: 11 mg/dL (ref 6–24)
CO2: 23 mmol/L (ref 20–29)
Calcium: 8.8 mg/dL (ref 8.7–10.2)
Chloride: 105 mmol/L (ref 96–106)
Creatinine, Ser: 0.87 mg/dL (ref 0.57–1.00)
Glucose: 124 mg/dL — ABNORMAL HIGH (ref 70–99)
Potassium: 4.2 mmol/L (ref 3.5–5.2)
Sodium: 141 mmol/L (ref 134–144)
eGFR: 85 mL/min/{1.73_m2} (ref >59)

## 2023-03-06 LAB — VITAMIN B12: Vitamin B-12: 485 pg/mL (ref 232–1245)

## 2023-03-06 LAB — HEPATITIS C ANTIBODY: Hep C Virus Ab: NONREACTIVE

## 2023-03-06 NOTE — Telephone Encounter (Signed)
 Patient's B12 level was normal on labwork.  Should she continue B12 injections or ok to stop and do OTC now?

## 2023-03-06 NOTE — Telephone Encounter (Signed)
 Ok to continue OTC going forward.

## 2023-03-06 NOTE — Progress Notes (Signed)
Cyanocobalamin injection given to left deltoid.  Patient tolerated well. 

## 2023-03-11 ENCOUNTER — Ambulatory Visit (INDEPENDENT_AMBULATORY_CARE_PROVIDER_SITE_OTHER): Payer: Medicaid Other | Admitting: Orthopedic Surgery

## 2023-03-11 ENCOUNTER — Telehealth: Payer: Self-pay | Admitting: Family Medicine

## 2023-03-11 ENCOUNTER — Other Ambulatory Visit (INDEPENDENT_AMBULATORY_CARE_PROVIDER_SITE_OTHER): Payer: Self-pay

## 2023-03-11 DIAGNOSIS — M25561 Pain in right knee: Secondary | ICD-10-CM

## 2023-03-11 DIAGNOSIS — M25562 Pain in left knee: Secondary | ICD-10-CM | POA: Diagnosis not present

## 2023-03-11 DIAGNOSIS — M170 Bilateral primary osteoarthritis of knee: Secondary | ICD-10-CM

## 2023-03-11 DIAGNOSIS — M1712 Unilateral primary osteoarthritis, left knee: Secondary | ICD-10-CM

## 2023-03-11 NOTE — Telephone Encounter (Signed)
 Copied from CRM [id number]. Topic: Clinical - Prescription Issue >> Mar 11, 2023  4:22 PM Laurier C wrote: Reason for CRM: Patient is having issue with filling her script for tirzepatide  (MOUNJARO ) 15 MG/0.5ML Pen. Patient states she already had a scheduled appointment with the provider on 1/8//2025. Patient says she needs the script to be refilled without her having to comeback to the office.

## 2023-03-11 NOTE — Progress Notes (Signed)
 Orthopedic Office Note  Patient has bilateral knee pain. She has osteoarthritis in both knees on XR. She has gotten previous injections and they have been helpful. She was interested in repeat injections today.   Patient was in the seated position with her knees at 90 degrees. The anterolateral soft spot over the left knee was prepped with alcohol based prep. The skin was anesthetized with ethyl chloride. A 20 gauge needle was used to inject 1cc of bupivacaine , 1cc of lidocaine , and 1cc of depomedrol. The needle was withdrawn and band aid was applied. The same procedure was then repeated for the right knee. Patient tolerated the procedure well.   Encouraged her to work on weight loss. I told her if the injections stop working, TKA would be the treatment. She is not currently a candidate since her BMI is 53.8. Told her that she can get these injections up to very 3 months.

## 2023-03-12 ENCOUNTER — Other Ambulatory Visit (HOSPITAL_COMMUNITY): Payer: Self-pay

## 2023-03-12 ENCOUNTER — Telehealth: Payer: Self-pay

## 2023-03-12 NOTE — Telephone Encounter (Signed)
 PA request has been Submitted. New Encounter created for follow up. For additional info see Pharmacy Prior Auth telephone encounter from 03/12/23.

## 2023-03-12 NOTE — Telephone Encounter (Signed)
 Pharmacy Patient Advocate Encounter   Received notification from Pt Calls Messages that prior authorization for Mounjaro  15MG /0.5ML auto-injectors is required/requested.   Insurance verification completed.   The patient is insured through [employer] .   Per test claim: PA required; PA submitted to above mentioned insurance via CoverMyMeds Key/confirmation #/EOC [id number] Status is pending

## 2023-03-12 NOTE — Telephone Encounter (Signed)
 Pharmacy Patient Advocate Encounter  Received notification from  [employer]  that Prior Authorization for Mounjaro  15MG /0.5ML auto-injectors has been APPROVED from 03/12/23 to 03/11/24. Ran test claim, Copay is $15. This test claim was processed through [facility]- copay amounts may vary at other pharmacies due to pharmacy/plan contracts, or as the patient moves through the different stages of their insurance plan.   PA #/Case ID/Reference #: [id number]    All strengths of the drug are approved.

## 2023-03-13 ENCOUNTER — Encounter: Payer: Self-pay | Admitting: Family Medicine

## 2023-03-14 ENCOUNTER — Other Ambulatory Visit: Payer: Self-pay | Admitting: Family Medicine

## 2023-03-14 MED ORDER — FERROUS SULFATE 325 (65 FE) MG PO TABS: 325.0000 mg | ORAL_TABLET | Freq: Two times a day (BID) | ORAL | 3 refills | Status: DC

## 2023-03-14 NOTE — Telephone Encounter (Signed)
Copied from CRM [id number]. Topic: Clinical - Medication Refill >> Mar 14, 2023 12:46 PM Joanette Gula wrote: Most Recent Primary Care Visit:  Provider: Quay Burow  Department: [facility]  Visit Type: CLINICAL SUPPORT  Date: 03/06/2023  Medication: ferrous sulfate 325 (65 FE) MG tablet.. Patient also want to know if the Prior Auth has been completed for tirzepatide [facility]) 15 MG/0.5ML Pen  Has the patient contacted their pharmacy? Yes (Agent: If no, request that the patient contact the pharmacy for the refill. If patient does not wish to contact the pharmacy document the reason why and proceed with request.) (Agent: If yes, when and what did the pharmacy advise?)  Is this the correct pharmacy for this prescription? Yes If no, delete pharmacy and type the correct one.  This is the patient's preferred pharmacy:  [facility] [place], [place] - [address] [place] [address] Phone: [phone] Fax: [phone]   Has the prescription been filled recently? Yes  Is the patient out of the medication? Yes  Has the patient been seen for an appointment in the last year OR does the patient have an upcoming appointment? Yes  Can we respond through MyChart? No  Agent: Please be advised that Rx refills may take up to 3 business days. We ask that you follow-up with your pharmacy.

## 2023-03-19 ENCOUNTER — Ambulatory Visit: Payer: Managed Care, Other (non HMO) | Admitting: Family Medicine

## 2023-03-28 ENCOUNTER — Other Ambulatory Visit: Payer: Self-pay | Admitting: Family Medicine

## 2023-03-28 DIAGNOSIS — R110 Nausea: Secondary | ICD-10-CM

## 2023-03-28 MED ORDER — ONDANSETRON HCL 4 MG PO TABS
ORAL_TABLET | ORAL | 1 refills | Status: DC
Start: 2023-03-28 — End: 2023-06-16

## 2023-03-28 NOTE — Telephone Encounter (Signed)
Copied from CRM [id number]. Topic: Clinical - Medication Refill >> Mar 28, 2023  3:46 PM Hector Shade B wrote: Most Recent Primary Care Visit:  Provider: Quay Burow  Department: [facility]  Visit Type: CLINICAL SUPPORT  Date: 03/06/2023  Medication:  ondansetron (ZOFRAN) 4 MG tablet    Has the patient contacted their pharmacy? Yes (Agent: If no, request that the patient contact the pharmacy for the refill. If patient does not wish to contact the pharmacy document the reason why and proceed with request.) (Agent: If yes, when and what did the pharmacy advise?)Stated they were sending over a refill request   Is this the correct pharmacy for this prescription? Yes If no, delete pharmacy and type the correct one.  This is the patient's preferred pharmacy:  [facility] [place], [place] - [address] [place] [address] Phone: [phone] Fax: [phone]  [facility] [address], [place] - [place] [address] [place] [address] [place] [address] Phone: [phone] Fax: [phone]   Has the prescription been filled recently? Yes  Is the patient out of the medication? Yes  Has the patient been seen for an appointment in the last year OR does the patient have an upcoming appointment? Yes  Can we respond through MyChart? Yes  Agent: Please be advised that Rx refills may take up to 3 business days. We ask that you follow-up with your pharmacy.

## 2023-04-08 ENCOUNTER — Ambulatory Visit: Payer: Self-pay | Admitting: Family Medicine

## 2023-04-11 ENCOUNTER — Ambulatory Visit: Payer: Self-pay | Admitting: Family Medicine

## 2023-04-17 ENCOUNTER — Other Ambulatory Visit: Payer: Self-pay | Admitting: Family Medicine

## 2023-04-17 DIAGNOSIS — Z7985 Long-term (current) use of injectable non-insulin antidiabetic drugs: Secondary | ICD-10-CM

## 2023-04-24 ENCOUNTER — Encounter (INDEPENDENT_AMBULATORY_CARE_PROVIDER_SITE_OTHER): Payer: Self-pay | Admitting: Family Medicine

## 2023-04-24 DIAGNOSIS — J069 Acute upper respiratory infection, unspecified: Secondary | ICD-10-CM | POA: Diagnosis not present

## 2023-04-25 ENCOUNTER — Telehealth: Payer: Self-pay

## 2023-04-25 MED ORDER — GUAIFENESIN-CODEINE 100-10 MG/5ML PO SOLN
5.0000 mL | Freq: Four times a day (QID) | ORAL | 0 refills | Status: DC | PRN
Start: 2023-04-25 — End: 2023-09-19

## 2023-04-25 MED ORDER — BENZONATATE 200 MG PO CAPS
200.0000 mg | ORAL_CAPSULE | Freq: Three times a day (TID) | ORAL | 0 refills | Status: DC | PRN
Start: 2023-04-25 — End: 2023-09-19

## 2023-04-25 MED ORDER — AZELASTINE HCL 0.1 % NA SOLN
1.0000 | Freq: Two times a day (BID) | NASAL | 12 refills | Status: AC
Start: 2023-04-25 — End: ?

## 2023-04-25 NOTE — Telephone Encounter (Signed)
 Please see the MyChart message reply(ies) for my assessment and plan.    This patient gave consent for this Medical Advice Message and is aware that it may result in a bill to [employer], as well as the possibility of receiving a bill for a co-payment or deductible. They are an established patient, but are not seeking medical advice exclusively about a problem treated during an in person or video visit in the last seven days. I did not recommend an in person or video visit within seven days of my reply.    I spent a total of 7 minutes cumulative time within 7 days through [employer].  Delynn Flavin, DO

## 2023-04-25 NOTE — Telephone Encounter (Signed)
 Copied from CRM [id number]. Topic: General - Other >> Apr 25, 2023 10:30 AM Eunice Blase wrote: Reason for CRM: Pt called has coughing would like medication sent to pharmacy. [facility] [place], [place] - [address] [place] [address] Phone: [phone] Fax: [phone]

## 2023-04-25 NOTE — Telephone Encounter (Signed)
 Patient has a Clinical cytogeneticist message going on with PCP. Issues are being addressed in that encounter

## 2023-05-20 ENCOUNTER — Other Ambulatory Visit: Payer: Self-pay | Admitting: Family Medicine

## 2023-05-20 DIAGNOSIS — Z7985 Long-term (current) use of injectable non-insulin antidiabetic drugs: Secondary | ICD-10-CM

## 2023-05-21 ENCOUNTER — Encounter: Payer: Self-pay | Admitting: Family Medicine

## 2023-05-23 ENCOUNTER — Encounter: Payer: Self-pay | Admitting: Family Medicine

## 2023-05-23 ENCOUNTER — Ambulatory Visit (INDEPENDENT_AMBULATORY_CARE_PROVIDER_SITE_OTHER): Admitting: Family Medicine

## 2023-05-23 ENCOUNTER — Ambulatory Visit (INDEPENDENT_AMBULATORY_CARE_PROVIDER_SITE_OTHER)

## 2023-05-23 VITALS — BP 143/77 | HR 87 | Temp 98.3°F | Ht 70.0 in | Wt 374.6 lb

## 2023-05-23 DIAGNOSIS — R079 Chest pain, unspecified: Secondary | ICD-10-CM | POA: Diagnosis not present

## 2023-05-23 LAB — CBC
Hematocrit: 43.8 % (ref 34.0–46.6)
Hemoglobin: 13.7 g/dL (ref 11.1–15.9)
MCH: 28.3 pg (ref 26.6–33.0)
MCHC: 31.3 g/dL — ABNORMAL LOW (ref 31.5–35.7)
MCV: 91 fL (ref 79–97)
Platelets: 201 10*3/uL (ref 150–450)
RBC: 4.84 x10E6/uL (ref 3.77–5.28)
RDW: 16.3 % — ABNORMAL HIGH (ref 11.7–15.4)
WBC: 6.3 10*3/uL (ref 3.4–10.8)

## 2023-05-23 LAB — CMP14+EGFR
ALT: 16 IU/L (ref 0–32)
AST: 16 IU/L (ref 0–40)
Albumin: 3.9 g/dL (ref 3.9–4.9)
Alkaline Phosphatase: 81 IU/L (ref 44–121)
BUN/Creatinine Ratio: 11 (ref 9–23)
BUN: 11 mg/dL (ref 6–24)
Bilirubin Total: <0.2 mg/dL (ref 0.0–1.2)
CO2: 20 mmol/L (ref 20–29)
Calcium: 8.6 mg/dL — ABNORMAL LOW (ref 8.7–10.2)
Chloride: 108 mmol/L — ABNORMAL HIGH (ref 96–106)
Creatinine, Ser: 0.97 mg/dL (ref 0.57–1.00)
Globulin, Total: 3 g/dL (ref 1.5–4.5)
Glucose: 91 mg/dL (ref 70–99)
Potassium: 4.1 mmol/L (ref 3.5–5.2)
Sodium: 141 mmol/L (ref 134–144)
Total Protein: 6.9 g/dL (ref 6.0–8.5)
eGFR: 74 mL/min/{1.73_m2} (ref >59)

## 2023-05-23 LAB — TROPONIN T: Troponin T (Highly Sensitive): 11 ng/L (ref 0–14)

## 2023-05-23 NOTE — Progress Notes (Signed)
 Subjective: CC: Chest pain PCP: [name], DO [id number] Marilyn Rivera is a 43 y.o. female presenting to clinic today for:  1.  Chest pain Patient reports that she had some intermittent chest pain that started on Tuesday.  She describes it as right-sided, sharp and burning and radiating to her back.  It seems to come and go over the course of 10 to 15 minutes.  It then resolved independently.  She is chronically treated with a PPI.  She notes tenderness to touch along the anterior ribs.  She reports no associated hemoptysis, dizziness, lower extremity swelling or upper extremity swelling.  There is a family history of heart disease   ROS: Per HPI  Allergies  Allergen Reactions   Keflex [Cephalexin]     Yeast infections    Coconut Fatty Acid Itching and Rash   Past Medical History:  Diagnosis Date   Arthritis    Diverticulitis large intestine w/o perforation or abscess w/o bleeding 10/05/2014   Diverticulosis    GERD (gastroesophageal reflux disease)    history of H Pylori   Helicobacter pylori gastritis 09/06/2014   History of kidney stones    Iron deficiency anemia    Kidney stones     Current Outpatient Medications:    amLODipine (NORVASC) 5 MG tablet, Take 1 tablet (5 mg total) by mouth daily., Disp: 90 tablet, Rfl: 3   azelastine (ASTELIN) 0.1 % nasal spray, Place 1 spray into both nostrils 2 (two) times daily., Disp: 30 mL, Rfl: 12   benzonatate (TESSALON) 200 MG capsule, Take 1 capsule (200 mg total) by mouth 3 (three) times daily as needed for cough., Disp: 20 capsule, Rfl: 0   cyclobenzaprine (FLEXERIL) 5 MG tablet, Take 1 tablet (5 mg total) by mouth 3 (three) times daily as needed for muscle spasms., Disp: 90 tablet, Rfl: 1   EPINEPHrine 0.3 mg/0.3 mL IJ SOAJ injection, Inject 0.3 mg into the muscle as needed for anaphylaxis (severe allergic reaction, then call 911)., Disp: 1 each, Rfl: 0   ferrous sulfate 325 (65 FE) MG tablet, Take 1 tablet (325 mg total)  by mouth 2 (two) times daily with a meal., Disp: 180 tablet, Rfl: 3   glucose blood test strip, UAD to check sugar twice daily E11.649 (using accuchek), Disp: 100 each, Rfl: 12   guaiFENesin-codeine 100-10 MG/5ML syrup, Take 5 mLs by mouth every 6 (six) hours as needed for cough., Disp: 240 mL, Rfl: 0   ibuprofen (ADVIL) 200 MG tablet, Take 2 tablets (400 mg total) by mouth every 8 (eight) hours as needed for moderate pain., Disp: , Rfl:    Lancet Device MISC, UAD to check sugar twice daily E11.649 (using Accucheck machine), Disp: 100 each, Rfl: 12   montelukast (SINGULAIR) 10 MG tablet, Take 1 tablet (10 mg total) by mouth at bedtime., Disp: 90 tablet, Rfl: 3   MOUNJARO 12.5 MG/0.5ML Pen, INJECT 12.5MG  ONCE WEEKLY, Disp: 2 mL, Rfl: 0   omeprazole (PRILOSEC) 40 MG capsule, Take 1 capsule (40 mg total) by mouth daily., Disp: 90 capsule, Rfl: 3   ondansetron (ZOFRAN) 4 MG tablet, TAKE ONE TABLET EVERY 8 HOURS AS NEEDED FOR NAUSEA AND VOMITING, Disp: 30 tablet, Rfl: 1   tirzepatide (MOUNJARO) 10 MG/0.5ML Pen, Inject 10 mg into the skin once a week., Disp: 3 mL, Rfl: 0   tirzepatide (MOUNJARO) 15 MG/0.5ML Pen, Inject 15 mg into the skin once a week., Disp: 6 mL, Rfl: PRN   traZODone (DESYREL) 50 MG  tablet, Take 0.5-1 tablets (25-50 mg total) by mouth at bedtime as needed for sleep., Disp: 30 tablet, Rfl: 3  Current Facility-Administered Medications:    cyanocobalamin (VITAMIN B12) injection 1,000 mcg, 1,000 mcg, Intramuscular, Q30 days, Jaimen Melone M, DO, 1,000 mcg at 03/06/23 0908 Social History   Socioeconomic History   Marital status: Divorced    Spouse name: Not on file   Number of children: 0   Years of education: Not on file   Highest education level: Some college, no degree  Occupational History   Occupation: Estate agent, Nurse, adult for resource side  Tobacco Use   Smoking status: Never   Smokeless tobacco: Never  Vaping Use   Vaping status: Never Used  Substance and Sexual  Activity   Alcohol use: No   Drug use: No   Sexual activity: Not on file  Other Topics Concern   Not on file  Social History Narrative   Not on file   Social Drivers of Health   Financial Resource Strain: Low Risk  (02/08/2023)   Overall Financial Resource Strain (CARDIA)    Difficulty of Paying Living Expenses: Not hard at all  Food Insecurity: No Food Insecurity (02/08/2023)   Hunger Vital Sign    Worried About Running Out of Food in the Last Year: Never true    Ran Out of Food in the Last Year: Never true  Transportation Needs: No Transportation Needs (02/08/2023)   PRAPARE - Administrator, Civil Service (Medical): No    Lack of Transportation (Non-Medical): No  Physical Activity: Inactive (02/08/2023)   Exercise Vital Sign    Days of Exercise per Week: 0 days    Minutes of Exercise per Session: 30 min  Stress: No Stress Concern Present (02/08/2023)   [employer] of Occupational Health - Occupational Stress Questionnaire    Feeling of Stress : Only a little  Social Connections: Moderately Integrated (02/08/2023)   Social Connection and Isolation Panel [NHANES]    Frequency of Communication with Friends and Family: More than three times a week    Frequency of Social Gatherings with Friends and Family: Three times a week    Attends Religious Services: More than 4 times per year    Active Member of Clubs or Organizations: Yes    Attends Banker Meetings: More than 4 times per year    Marital Status: Divorced  Intimate Partner Violence: Unknown (09/02/2021)   Received from [employer], [facility]   HITS    Physically Hurt: Not on file    Insult or Talk Down To: Not on file    Threaten Physical Harm: Not on file    Scream or Curse: Not on file   Family History  Problem Relation Age of Onset   Heart failure Mother    Hypertension Mother    Heart disease Mother    Cancer Other        mother's side of family, breast cancer   Cancer  Other        father's side of family, leukemia   Hypertension Father    Stroke Father 61   Diabetes Other    Hypertension Other    Colon cancer Cousin    Healthy Brother    Breast cancer Maternal Aunt 35   Leukemia Paternal Aunt    Kidney disease Maternal Grandmother    Hypertension Maternal Grandmother    Cancer Maternal Grandfather    Leukemia Paternal Grandmother    Cancer Paternal Grandfather  Objective: Office vital signs reviewed. BP (!) 143/77   Pulse 87   Temp 98.3 F (36.8 C)   Ht 5\' 10"  (1.778 m)   Wt (!) 374 lb 9.6 oz (169.9 kg)   SpO2 95%   BMI 53.75 kg/m   Physical Examination:  General: Awake, alert, morbidly obese female, No acute distress HEENT: Sclera white.  Moist mucous membranes Cardio: regular rate and rhythm, S1S2 heard, no murmurs appreciated Pulm: clear to auscultation bilaterally, no wheezes, rhonchi or rales; normal work of breathing on room air Extremities: Warm, well-perfused.  No tenderness to palpation along the deep venous system of either the legs or the arms  DG Chest 2 View Result Date: 05/23/2023 CLINICAL DATA:  Chest pain EXAM: CHEST - 2 VIEW COMPARISON:  06/02/2017 FINDINGS: Lungs are clear.  No pneumothorax. Heart size and mediastinal contours are within normal limits. No effusion. Visualized bones unremarkable. IMPRESSION: No acute cardiopulmonary disease. Electronically Signed   By: Corlis Leak M.D.   On: 05/23/2023 09:27   Results for orders placed or performed in visit on 05/23/23 (from the past 24 hours)  Troponin T     Status: None   Collection Time: 05/23/23  9:54 AM  Result Value Ref Range   Troponin T (Highly Sensitive) 11 0 - 14 ng/L   Narrative   Performed at:  [address], [place], [place]  [id number] Lab Director: Jolene Schimke MD, Phone:  [phone]  CMP14+EGFR     Status: Abnormal   Collection Time: 05/23/23  9:54 AM  Result Value Ref Range   Glucose 91 70 - 99 mg/dL   BUN 11 6 - 24  mg/dL   Creatinine, Ser 8.29 0.57 - 1.00 mg/dL   eGFR 74 >56 OZ/HYQ/6.57   BUN/Creatinine Ratio 11 9 - 23   Sodium 141 134 - 144 mmol/L   Potassium 4.1 3.5 - 5.2 mmol/L   Chloride 108 (H) 96 - 106 mmol/L   CO2 20 20 - 29 mmol/L   Calcium 8.6 (L) 8.7 - 10.2 mg/dL   Total Protein 6.9 6.0 - 8.5 g/dL   Albumin 3.9 3.9 - 4.9 g/dL   Globulin, Total 3.0 1.5 - 4.5 g/dL   Bilirubin Total <8.4 0.0 - 1.2 mg/dL   Alkaline Phosphatase 81 44 - 121 IU/L   AST 16 0 - 40 IU/L   ALT 16 0 - 32 IU/L   Narrative   Performed at:  [address] [facility] [address] [place], [place]  [id number] Lab Director: Jolene Schimke MD, Phone:  [phone]  CBC     Status: Abnormal   Collection Time: 05/23/23  9:54 AM  Result Value Ref Range   WBC 6.3 3.4 - 10.8 x10E3/uL   RBC 4.84 3.77 - 5.28 x10E6/uL   Hemoglobin 13.7 11.1 - 15.9 g/dL   Hematocrit 25.3 66.4 - 46.6 %   MCV 91 79 - 97 fL   MCH 28.3 26.6 - 33.0 pg   MCHC 31.3 (L) 31.5 - 35.7 g/dL   RDW 40.3 (H) 47.4 - 25.9 %   Platelets 201 150 - 450 x10E3/uL   Narrative   Performed at:  [address], [place], [place]  [id number] Lab Director: Jolene Schimke MD, Phone:  [phone]   Assessment/ Plan: 43 y.o. female   Chest pain, unspecified type - Plan: EKG 12-Lead, DG Chest 2 View, Troponin T, CMP14+EGFR, CBC, CANCELED: CMP14+EGFR, CANCELED: CBC  Chest x-ray unremarkable.  EKG unchanged from  previous in 2014.  Negative rapid troponin.  No appreciable etiology from a cardiac standpoint of chest pain.  Possibly related to GERD.  Advised use of Pepcid as needed.  I did ask that she consider coronary artery calcium scoring given multiple comorbidities and family history.  She is to consider this   [name], DO [facility] [phone]

## 2023-06-05 ENCOUNTER — Ambulatory Visit (INDEPENDENT_AMBULATORY_CARE_PROVIDER_SITE_OTHER): Payer: Self-pay | Admitting: Orthopedic Surgery

## 2023-06-05 DIAGNOSIS — M1712 Unilateral primary osteoarthritis, left knee: Secondary | ICD-10-CM

## 2023-06-05 DIAGNOSIS — M170 Bilateral primary osteoarthritis of knee: Secondary | ICD-10-CM

## 2023-06-05 NOTE — Progress Notes (Signed)
 Orthopedic Note  Patient interested in repeat knee injections today.  She had them previously about 3 months ago and found them to be helpful.  Procedure note: Patient was in the seated position with the knees at 90 degrees.  The anterior lateral soft spot was prepped with alcohol based prep.  Ethyl chloride was used to anesthetize the area.  A 20-gauge needle was used to inject 1 cc of bupivacaine, 1 cc of lidocaine, 1 cc of Depo-Medrol under standard sterile technique into the right knee intra-articular space.  Needle was withdrawn and Band-Aid was applied.  The same process was then repeated to perform a left knee intra-articular injection.  Patient tolerated the procedure well.  Patient can get knee injections up to every 3 months.  She can return on an as-needed basis.  London Sheer, MD Orthopedic Surgeon

## 2023-06-09 ENCOUNTER — Ambulatory Visit: Payer: Self-pay | Admitting: Orthopedic Surgery

## 2023-06-16 ENCOUNTER — Other Ambulatory Visit: Payer: Self-pay | Admitting: Family Medicine

## 2023-06-16 DIAGNOSIS — R110 Nausea: Secondary | ICD-10-CM

## 2023-06-19 ENCOUNTER — Telehealth: Payer: Self-pay | Admitting: Pharmacy Technician

## 2023-06-19 ENCOUNTER — Other Ambulatory Visit (HOSPITAL_COMMUNITY): Payer: Self-pay

## 2023-06-19 ENCOUNTER — Encounter: Payer: Self-pay | Admitting: Family Medicine

## 2023-06-19 NOTE — Telephone Encounter (Signed)
 Marilyn Rivera

## 2023-06-19 NOTE — Telephone Encounter (Signed)
 Pharmacy Patient Advocate Encounter   Received notification from CoverMyMeds that prior authorization for mounjaro  is required/requested.   Insurance verification completed.   The patient is insured through [facility] .   Per test claim: PA required; PA started via CoverMyMeds. KEY [id number] . Waiting for clinical questions to populate.

## 2023-06-20 ENCOUNTER — Other Ambulatory Visit (HOSPITAL_COMMUNITY): Payer: Self-pay

## 2023-06-20 NOTE — Telephone Encounter (Signed)
 Submitted under new key [id number]

## 2023-06-20 NOTE — Telephone Encounter (Signed)
 Pharmacy Patient Advocate Encounter  Received notification from [facility] that Prior Authorization for mounjaro  15mg  has been APPROVED from 06/20/23 to 06/19/24. Spoke to pharmacy to process.Copay is $15.00.    I called the patient and left her a message

## 2023-07-08 ENCOUNTER — Other Ambulatory Visit: Payer: Self-pay | Admitting: Family Medicine

## 2023-07-08 DIAGNOSIS — L732 Hidradenitis suppurativa: Secondary | ICD-10-CM

## 2023-07-10 ENCOUNTER — Other Ambulatory Visit: Payer: Self-pay | Admitting: Family Medicine

## 2023-07-11 ENCOUNTER — Encounter: Payer: Self-pay | Admitting: Family Medicine

## 2023-07-11 ENCOUNTER — Other Ambulatory Visit: Payer: Self-pay | Admitting: Family

## 2023-07-11 ENCOUNTER — Telehealth: Admitting: Family

## 2023-07-23 ENCOUNTER — Encounter: Payer: Self-pay | Admitting: Family Medicine

## 2023-07-25 ENCOUNTER — Telehealth: Admitting: Family Medicine

## 2023-07-25 ENCOUNTER — Encounter: Payer: Self-pay | Admitting: Family Medicine

## 2023-07-25 ENCOUNTER — Telehealth: Payer: Self-pay | Admitting: Family Medicine

## 2023-07-25 DIAGNOSIS — L732 Hidradenitis suppurativa: Secondary | ICD-10-CM

## 2023-07-25 DIAGNOSIS — R110 Nausea: Secondary | ICD-10-CM

## 2023-07-25 MED ORDER — FLUCONAZOLE 150 MG PO TABS
150.0000 mg | ORAL_TABLET | Freq: Once | ORAL | 0 refills | Status: AC
Start: 2023-07-25 — End: 2023-07-25

## 2023-07-25 MED ORDER — SULFAMETHOXAZOLE-TRIMETHOPRIM 800-160 MG PO TABS
1.0000 | ORAL_TABLET | Freq: Two times a day (BID) | ORAL | 1 refills | Status: DC
Start: 2023-07-25 — End: 2023-09-19

## 2023-07-25 MED ORDER — ONDANSETRON HCL 4 MG PO TABS
ORAL_TABLET | ORAL | 1 refills | Status: DC
Start: 2023-07-25 — End: 2023-09-19

## 2023-07-25 MED ORDER — CYCLOBENZAPRINE HCL 5 MG PO TABS: 5.0000 mg | ORAL_TABLET | Freq: Three times a day (TID) | ORAL | 1 refills | Status: DC | PRN

## 2023-07-25 NOTE — Progress Notes (Signed)
 MyChart Video visit  Subjective: CC: boils PCP: Eliodoro Guerin, DO [id number] Marilyn Rivera is a 43 y.o. female. Patient provides verbal consent for consult held via video.  Due to COVID-19 pandemic this visit was conducted virtually. This visit type was conducted due to national recommendations for restrictions regarding the COVID-19 Pandemic (e.g. social distancing, sheltering in place) in an effort to limit this patient's exposure and mitigate transmission in our community. All issues noted in this document were discussed and addressed.  A physical exam was not performed with this format.   Location of patient: home Location of provider: [facility] Others present for call: none  1. Hidradenitis Suppurativa She reports drainage on right axilla. Has been present for 2 weeks. She reports soreness.    ROS: Per HPI  Allergies  Allergen Reactions   Keflex  [Cephalexin ]     Yeast infections    Coconut Fatty Acid Itching and Rash   Past Medical History:  Diagnosis Date   Arthritis    Diverticulitis large intestine w/o perforation or abscess w/o bleeding 10/05/2014   Diverticulosis    GERD (gastroesophageal reflux disease)    history of H Pylori   Helicobacter pylori gastritis 09/06/2014   History of kidney stones    Iron deficiency anemia    Kidney stones     Current Outpatient Medications:    amLODipine  (NORVASC ) 5 MG tablet, Take 1 tablet (5 mg total) by mouth daily., Disp: 90 tablet, Rfl: 3   azelastine  (ASTELIN ) 0.1 % nasal spray, Place 1 spray into both nostrils 2 (two) times daily., Disp: 30 mL, Rfl: 12   benzonatate  (TESSALON ) 200 MG capsule, Take 1 capsule (200 mg total) by mouth 3 (three) times daily as needed for cough., Disp: 20 capsule, Rfl: 0   cyclobenzaprine  (FLEXERIL ) 5 MG tablet, Take 1 tablet (5 mg total) by mouth 3 (three) times daily as needed for muscle spasms., Disp: 90 tablet, Rfl: 1   EPINEPHrine  0.3 mg/0.3 mL IJ SOAJ injection, Inject 0.3 mg into the muscle  as needed for anaphylaxis (severe allergic reaction, then call 911)., Disp: 1 each, Rfl: 0   ferrous sulfate  325 (65 FE) MG tablet, Take 1 tablet (325 mg total) by mouth 2 (two) times daily with a meal., Disp: 180 tablet, Rfl: 3   glucose blood test strip, UAD to check sugar twice daily E11.649 (using accuchek), Disp: 100 each, Rfl: 12   guaiFENesin -codeine  100-10 MG/5ML syrup, Take 5 mLs by mouth every 6 (six) hours as needed for cough., Disp: 240 mL, Rfl: 0   ibuprofen  (ADVIL ) 200 MG tablet, Take 2 tablets (400 mg total) by mouth every 8 (eight) hours as needed for moderate pain., Disp: , Rfl:    Lancet Device MISC, UAD to check sugar twice daily E11.649 (using Accucheck machine), Disp: 100 each, Rfl: 12   montelukast  (SINGULAIR ) 10 MG tablet, Take 1 tablet (10 mg total) by mouth at bedtime., Disp: 90 tablet, Rfl: 3   MOUNJARO  12.5 MG/0.5ML Pen, INJECT 12.5MG  ONCE WEEKLY, Disp: 2 mL, Rfl: 0   omeprazole  (PRILOSEC) 40 MG capsule, Take 1 capsule (40 mg total) by mouth daily., Disp: 90 capsule, Rfl: 3   ondansetron  (ZOFRAN ) 4 MG tablet, TAKE ONE TABLET EVERY 8 HOURS AS NEEDED FOR NAUSEA AND VOMITING, Disp: 30 tablet, Rfl: 1   tirzepatide  (MOUNJARO ) 10 MG/0.5ML Pen, Inject 10 mg into the skin once a week., Disp: 3 mL, Rfl: 0   tirzepatide  (MOUNJARO ) 15 MG/0.5ML Pen, Inject 15 mg into the skin once  a week., Disp: 6 mL, Rfl: PRN   traZODone  (DESYREL ) 50 MG tablet, Take 0.5-1 tablets (25-50 mg total) by mouth at bedtime as needed for sleep., Disp: 30 tablet, Rfl: 3  Current Facility-Administered Medications:    cyanocobalamin  (VITAMIN B12) injection 1,000 mcg, 1,000 mcg, Intramuscular, Q30 days, Mersades Barbaro M, DO, 1,000 mcg at 03/06/23 0908  Gen: well appearing female, NAD  Assessment/ Plan: 43 y.o. female    Hidradenitis suppurativa - Plan: sulfamethoxazole -trimethoprim  (BACTRIM  DS) 800-160 MG tablet, fluconazole  (DIFLUCAN ) 150 MG tablet  Nausea - Plan: ondansetron  (ZOFRAN ) 4 MG  tablet  Septra , Diflucan .  Home care instructions reviewed.  Sadly symptoms have been refractory to consistent use of clindamycin  pledgets.  Offered referral to dermatology but she declined  Start time: 3:46pm End time: 3:56pm  Total time spent on patient care (including video visit/ documentation): 11 minutes  Marilyn Muma Bambi Bonine, DO [facility] [phone]

## 2023-07-25 NOTE — Telephone Encounter (Signed)
 Called and left message for patient making her aware that her video visit for today will have to be rescheduled per provider and nurse. Dr Bonnell Butcher tried reaching out to patient 5x's and was unsuccessful, so for this reason, patient will need to make another appt.

## 2023-08-19 ENCOUNTER — Encounter: Payer: Self-pay | Admitting: Orthopedic Surgery

## 2023-08-19 ENCOUNTER — Ambulatory Visit (INDEPENDENT_AMBULATORY_CARE_PROVIDER_SITE_OTHER): Admitting: Orthopedic Surgery

## 2023-08-19 DIAGNOSIS — M1711 Unilateral primary osteoarthritis, right knee: Secondary | ICD-10-CM

## 2023-08-19 DIAGNOSIS — M170 Bilateral primary osteoarthritis of knee: Secondary | ICD-10-CM

## 2023-08-19 DIAGNOSIS — M1712 Unilateral primary osteoarthritis, left knee: Secondary | ICD-10-CM

## 2023-08-19 NOTE — Progress Notes (Signed)
 Orthopedic Office Note  Patient has been previously seen in the office with bilateral knee pain.  She has done injections.  She briefly lost her insurance that she was changing appointment and was no longer getting her Mounjaro .  She is back on Mounjaro  and feels that she is losing weight.  She is working towards getting down to a BMI of 40 since the knee injections are only providing her with about 3 months of relief.  She said her last injections stopped working within the last couple of days, so she continues to get about 3 months.  She was interested in getting a repeat injection today which was done in the office.  See procedure note below.   Bilateral knee injection note: After discussing the risk, benefits, and alternatives of bilateral intra-articular knee injections, patient elected to proceed.  The patient was in the seated position with the knees at 90 degrees.  The anterior lateral soft spot over the left knee was prepped with an alcohol-based prep.  Ethyl chloride was used to anesthetize the skin.  A 20-gauge needle was used to inject 1 cc of lidocaine , 1 cc of bupivacaine , 1 cc of Depo-Medrol  into the intra-articular space under standard sterile technique.  Needle was withdrawn and Band-Aid was applied.  The same procedure was then repeated on the right knee.  Patient tolerated the procedure well.  Ozell DELENA Ada, MD Orthopedic Surgeon

## 2023-08-28 ENCOUNTER — Encounter: Payer: Self-pay | Admitting: Family Medicine

## 2023-08-29 ENCOUNTER — Emergency Department (HOSPITAL_BASED_OUTPATIENT_CLINIC_OR_DEPARTMENT_OTHER)
Admission: EM | Admit: 2023-08-29 | Discharge: 2023-08-30 | Disposition: A | Discharge status: Home / Self Care | Attending: Emergency Medicine | Admitting: Emergency Medicine

## 2023-08-29 ENCOUNTER — Other Ambulatory Visit: Payer: Self-pay

## 2023-08-29 ENCOUNTER — Encounter (HOSPITAL_BASED_OUTPATIENT_CLINIC_OR_DEPARTMENT_OTHER): Payer: Self-pay

## 2023-08-29 DIAGNOSIS — I10 Essential (primary) hypertension: Secondary | ICD-10-CM | POA: Insufficient documentation

## 2023-08-29 DIAGNOSIS — Z794 Long term (current) use of insulin: Secondary | ICD-10-CM | POA: Diagnosis not present

## 2023-08-29 DIAGNOSIS — R208 Other disturbances of skin sensation: Secondary | ICD-10-CM

## 2023-08-29 DIAGNOSIS — E119 Type 2 diabetes mellitus without complications: Secondary | ICD-10-CM | POA: Insufficient documentation

## 2023-08-29 DIAGNOSIS — R0789 Other chest pain: Secondary | ICD-10-CM | POA: Insufficient documentation

## 2023-08-29 DIAGNOSIS — Z79899 Other long term (current) drug therapy: Secondary | ICD-10-CM | POA: Insufficient documentation

## 2023-08-29 DIAGNOSIS — R079 Chest pain, unspecified: Secondary | ICD-10-CM | POA: Diagnosis present

## 2023-08-29 NOTE — ED Triage Notes (Signed)
 Pt states she can feel a rash that isn't on her skin, feels painful. States she put some lidocaine  cream & that helped some. Reports moving hurts, her shirt touching it hurts.  States it is on the right side of her torso.

## 2023-08-30 MED ORDER — GABAPENTIN 300 MG PO CAPS: 300.0000 mg | ORAL_CAPSULE | Freq: Three times a day (TID) | ORAL | 0 refills | Status: DC

## 2023-08-30 MED ORDER — OXYCODONE HCL 5 MG PO TABS: 2.5000 mg | ORAL_TABLET | Freq: Four times a day (QID) | ORAL | 0 refills | Status: AC | PRN

## 2023-08-30 MED ORDER — ACYCLOVIR 800 MG PO TABS: 800.0000 mg | ORAL_TABLET | Freq: Every day | ORAL | 0 refills | Status: AC

## 2023-08-30 MED ORDER — KETOROLAC TROMETHAMINE 60 MG/2ML IM SOLN
30.0000 mg | Freq: Once | INTRAMUSCULAR | Status: AC
  Administered 2023-08-30: 30 mg via INTRAMUSCULAR
  Filled 2023-08-30: qty 2

## 2023-08-30 NOTE — ED Provider Notes (Signed)
 [facility] EMERGENCY DEPARTMENT AT [facility] Provider Note  CSN: [id number] Arrival date & time: 08/29/23 2238  Chief Complaint(s) Rash  HPI Marilyn Rivera is a 43 y.o. female 2 days of gradually worsening right-sided chest burning and electrical shock sensation.  Patient denies any rash or trauma.  Feels like a band distribution wrapping around her side.  Pain improved slightly after putting lidocaine  cream on it.  Reports having chickenpox as a child.  No abdominal pain.  No other physical complaints.  The history is provided by the patient.    Past Medical History Past Medical History:  Diagnosis Date   Arthritis    Diverticulitis large intestine w/o perforation or abscess w/o bleeding 10/05/2014   Diverticulosis    GERD (gastroesophageal reflux disease)    history of H Pylori   Helicobacter pylori gastritis 09/06/2014   History of kidney stones    Iron deficiency anemia    Kidney stones    Patient Active Problem List   Diagnosis Date Noted   Primary osteoarthritis of left knee 03/05/2023   Colitis 11/15/2022   Hypertension associated with diabetes (HCC) 05/02/2021   Dysuria 12/19/2020   Contusion of knee, left 11/23/2019   Contusion of calf, left, subsequent encounter 10/21/2019   Degenerative arthritis of right knee 10/21/2019   Elevated blood pressure reading without diagnosis of hypertension 08/24/2018   Allergic conjunctivitis of both eyes 08/24/2018   Pain in left leg 08/24/2018   Colon cancer screening 07/15/2018   Poor venous access    Chronic pain of right knee 05/27/2017   Seasonal allergies 05/14/2017   Iron deficiency anemia 04/15/2017   Diverticulitis 04/14/2017   Rectal bleeding 03/06/2017   Dysphagia    Menometrorrhagia 07/24/2016   GERD (gastroesophageal reflux disease) 04/24/2016   Helicobacter pylori gastritis 09/06/2014   Diverticulitis of colon    Nausea 05/26/2014   Morbid obesity (HCC) 04/27/2014   Home Medication(s) Prior  to Admission medications   Medication Sig Start Date End Date Taking? Authorizing Provider  acyclovir  (ZOVIRAX ) 800 MG tablet Take 1 tablet (800 mg total) by mouth 5 (five) times daily for 7 days. 08/30/23 09/06/23 Yes Zriyah Kopplin, Raynell Moder, MD  gabapentin  (NEURONTIN ) 300 MG capsule Take 1 capsule (300 mg total) by mouth 3 (three) times daily for 15 days. 08/30/23 09/14/23 Yes Ralpheal Zappone, Raynell Moder, MD  oxyCODONE  (ROXICODONE ) 5 MG immediate release tablet Take 0.5-1 tablets (2.5-5 mg total) by mouth every 6 (six) hours as needed for up to 5 days for severe pain (pain score 7-10). 08/30/23 09/04/23 Yes Rahmon Heigl, Raynell Moder, MD  amLODipine  (NORVASC ) 5 MG tablet Take 1 tablet (5 mg total) by mouth daily. 12/10/22   Jolinda Norene HERO, DO  azelastine  (ASTELIN ) 0.1 % nasal spray Place 1 spray into both nostrils 2 (two) times daily. 04/25/23   Jolinda Norene HERO, DO  benzonatate  (TESSALON ) 200 MG capsule Take 1 capsule (200 mg total) by mouth 3 (three) times daily as needed for cough. 04/25/23   Jolinda Norene HERO, DO  cyclobenzaprine  (FLEXERIL ) 5 MG tablet Take 1 tablet (5 mg total) by mouth 3 (three) times daily as needed for muscle spasms. 07/25/23   Jolinda Norene HERO, DO  EPINEPHrine  0.3 mg/0.3 mL IJ SOAJ injection Inject 0.3 mg into the muscle as needed for anaphylaxis (severe allergic reaction, then call 911). 05/01/21   Jolinda Norene HERO, DO  ferrous sulfate  325 (65 FE) MG tablet Take 1 tablet (325 mg total) by mouth 2 (two) times daily with a  meal. 03/14/23 06/12/23  Jolinda Potter M, DO  glucose blood test strip UAD to check sugar twice daily E11.649 (using accuchek) 01/01/21   Jolinda Potter HERO, DO  guaiFENesin -codeine  100-10 MG/5ML syrup Take 5 mLs by mouth every 6 (six) hours as needed for cough. 04/25/23   Jolinda Potter HERO, DO  ibuprofen  (ADVIL ) 200 MG tablet Take 2 tablets (400 mg total) by mouth every 8 (eight) hours as needed for moderate pain. 11/19/22   Von Bellis, MD  Lancet Device MISC  UAD to check sugar twice daily E11.649 (using Accucheck machine) 01/01/21   Jolinda Potter HERO, DO  montelukast  (SINGULAIR ) 10 MG tablet Take 1 tablet (10 mg total) by mouth at bedtime. 12/10/22   Jolinda Potter HERO, DO  MOUNJARO  12.5 MG/0.5ML Pen INJECT 12.5MG  ONCE WEEKLY 04/17/23   Jolinda Potter M, DO  omeprazole  (PRILOSEC) 40 MG capsule Take 1 capsule (40 mg total) by mouth daily. 12/10/22   Jolinda Potter M, DO  ondansetron  (ZOFRAN ) 4 MG tablet TAKE ONE TABLET EVERY 8 HOURS AS NEEDED FOR NAUSEA AND VOMITING 07/25/23   Jolinda Potter M, DO  sulfamethoxazole -trimethoprim  (BACTRIM  DS) 800-160 MG tablet Take 1 tablet by mouth 2 (two) times daily. 07/25/23   Jolinda Potter HERO, DO  tirzepatide  (MOUNJARO ) 10 MG/0.5ML Pen Inject 10 mg into the skin once a week. 03/05/23   Jolinda Potter HERO, DO  tirzepatide  (MOUNJARO ) 15 MG/0.5ML Pen Inject 15 mg into the skin once a week. 03/05/23   Jolinda Potter HERO, DO  traZODone  (DESYREL ) 50 MG tablet Take 0.5-1 tablets (25-50 mg total) by mouth at bedtime as needed for sleep. 12/10/22   Jolinda Potter HERO, DO                                                                                                                                    Allergies Keflex  [cephalexin ] and Coconut fatty acid  Review of Systems Review of Systems As noted in HPI  Physical Exam Vital Signs  I have reviewed the triage vital signs BP (!) 153/91 (BP Location: Right Arm)   Pulse 77   Temp 98.5 F (36.9 C)   Resp 20   Ht 5' 10 (1.778 m)   Wt (!) 163.3 kg   SpO2 97%   BMI 51.65 kg/m   Physical Exam Vitals reviewed.  Constitutional:      General: She is not in acute distress.    Appearance: She is well-developed. She is obese. She is not diaphoretic.  HENT:     Head: Normocephalic and atraumatic.     Right Ear: External ear normal.     Left Ear: External ear normal.     Nose: Nose normal.  Eyes:     General: No scleral icterus.    Conjunctiva/sclera:  Conjunctivae normal.  Neck:     Trachea: Phonation normal.  Cardiovascular:     Rate and Rhythm: Normal rate and regular rhythm.  Pulmonary:     Effort: Pulmonary effort is normal. No respiratory distress.     Breath sounds: No stridor.  Chest:    Abdominal:     General: There is no distension.  Musculoskeletal:        General: Normal range of motion.     Cervical back: Normal range of motion.  Skin:    Findings: No rash.  Neurological:     Mental Status: She is alert and oriented to person, place, and time.  Psychiatric:        Behavior: Behavior normal.     ED Results and Treatments Labs (all labs ordered are listed, but only abnormal results are displayed) Labs Reviewed - No data to display                                                                                                                       EKG  EKG Interpretation Date/Time:    Ventricular Rate:    PR Interval:    QRS Duration:    QT Interval:    QTC Calculation:   R Axis:      Text Interpretation:         Radiology No results found.  Medications Ordered in ED Medications  ketorolac  (TORADOL ) injection 30 mg (30 mg Intramuscular Given 08/30/23 0110)   Procedures Procedures  (including critical care time) Medical Decision Making / ED Course   Medical Decision Making Risk Prescription drug management.    Right chest wall pain with dermatomal distribution with hypersensitivity.  No associated rash but presentation is suspicious for preherpetic neuralgia. Will treat as such.     Final Clinical Impression(s) / ED Diagnoses Final diagnoses:  Burning sensation of skin   The patient appears reasonably screened and/or stabilized for discharge and I doubt any other medical condition or other [facility] requiring further screening, evaluation, or treatment in the ED at this time. I have discussed the findings, Dx and Tx plan with the patient/family who expressed understanding and agree(s) with the  plan. Discharge instructions discussed at length. The patient/family was given strict return precautions who verbalized understanding of the instructions. No further questions at time of discharge.  Disposition: Discharge  Condition: Good  ED Discharge Orders          Ordered    acyclovir  (ZOVIRAX ) 800 MG tablet  5 times daily        08/30/23 0143    gabapentin  (NEURONTIN ) 300 MG capsule  3 times daily        08/30/23 0143    oxyCODONE  (ROXICODONE ) 5 MG immediate release tablet  Every 6 hours PRN        08/30/23 0143            Follow Up: Jolinda Norene HERO, DO [address] [place] [address] [phone]  Call      This chart was dictated using voice recognition software.  Despite best efforts to proofread,  errors can occur which can change  the documentation meaning.    Trine Raynell Moder, MD 08/30/23 [phone]

## 2023-08-30 NOTE — Discharge Instructions (Addendum)
 For additional pain control you may take 1000 mg of acetaminophen  (Tylenol ) every 8 hours and/or 600 mg of Ibuprofen  (Motrin , Advil , etc.) every 6-8 hours as needed. In addition you can take 0.5 to 1 tablet of Oxycodone  every 6 hours as needed for pain not controlled with Tylenol , Motrin , or Gabapentin .  Please limit acetaminophen  (Tylenol ) to 4000 mg and Ibuprofen  (Motrin , Advil , etc.) to 2400 mg for a 24hr period. Please note that other over-the-counter medicine may contain acetaminophen  or ibuprofen  as a component of their ingredients.

## 2023-09-02 ENCOUNTER — Encounter: Payer: Medicaid Other | Admitting: Family Medicine

## 2023-09-08 ENCOUNTER — Ambulatory Visit: Admitting: Orthopedic Surgery

## 2023-09-19 ENCOUNTER — Ambulatory Visit (INDEPENDENT_AMBULATORY_CARE_PROVIDER_SITE_OTHER): Admitting: Family Medicine

## 2023-09-19 ENCOUNTER — Encounter: Payer: Self-pay | Admitting: Family Medicine

## 2023-09-19 VITALS — BP 134/83 | HR 73 | Temp 98.7°F | Ht 70.0 in | Wt 368.0 lb

## 2023-09-19 DIAGNOSIS — G8929 Other chronic pain: Secondary | ICD-10-CM

## 2023-09-19 DIAGNOSIS — B0229 Other postherpetic nervous system involvement: Secondary | ICD-10-CM | POA: Diagnosis not present

## 2023-09-19 DIAGNOSIS — M25561 Pain in right knee: Secondary | ICD-10-CM | POA: Diagnosis not present

## 2023-09-19 DIAGNOSIS — R110 Nausea: Secondary | ICD-10-CM | POA: Diagnosis not present

## 2023-09-19 DIAGNOSIS — L732 Hidradenitis suppurativa: Secondary | ICD-10-CM

## 2023-09-19 MED ORDER — ONDANSETRON HCL 4 MG PO TABS: ORAL_TABLET | ORAL | 1 refills | Status: DC

## 2023-09-19 MED ORDER — GABAPENTIN 300 MG PO CAPS: 300.0000 mg | ORAL_CAPSULE | Freq: Every day | ORAL | 0 refills | Status: DC

## 2023-09-19 MED ORDER — SULFAMETHOXAZOLE-TRIMETHOPRIM 800-160 MG PO TABS: 1.0000 | ORAL_TABLET | Freq: Two times a day (BID) | ORAL | 1 refills | Status: DC

## 2023-09-19 MED ORDER — OMEPRAZOLE 40 MG PO CPDR: 40.0000 mg | DELAYED_RELEASE_CAPSULE | Freq: Every day | ORAL | 3 refills | Status: DC

## 2023-09-19 NOTE — Progress Notes (Signed)
 Subjective: CC: Follow-up shingles PCP: Jolinda Marilyn HERO, DO YEP:Wprnoz Marilyn Rivera is a 43 y.o. female presenting to clinic today for:  1.  Shingles She reports that she had a shingles rash along the right rib cage.  She is status posttreatment with antivirals, gabapentin .  The gabapentin  does help with the pain but causes too much sedation to use during the daytime so she has been using this exclusively at bedtime.  Still having some pain even though the rash is gone.  Was not sure if this was normal.  2.  Hidradenitis suppurativa Has a spot coming up on her back and would like to get a refill of her Septra  Also needs refills on Prilosec and Zofran  as she continues to have nausea  3.  Osteoarthritis bilaterally Patient continues to have bilateral knee pain right greater than left.  Getting corticosteroid shots regularly.  Has not gone to viscosupplementation but anticipates she would likely need knee replacement on the right side soon     ROS: Per HPI  Allergies  Allergen Reactions   Keflex  [Cephalexin ]     Yeast infections    Coconut Fatty Acid Itching and Rash   Past Medical History:  Diagnosis Date   Arthritis    Diverticulitis large intestine w/o perforation or abscess w/o bleeding 10/05/2014   Diverticulitis of colon    Diverticulosis    GERD (gastroesophageal reflux disease)    history of H Pylori   Helicobacter pylori gastritis 09/06/2014   History of kidney stones    Iron deficiency anemia    Kidney stones    Poor venous access     Current Outpatient Medications:    amLODipine  (NORVASC ) 5 MG tablet, Take 1 tablet (5 mg total) by mouth daily., Disp: 90 tablet, Rfl: 3   azelastine  (ASTELIN ) 0.1 % nasal spray, Place 1 spray into both nostrils 2 (two) times daily., Disp: 30 mL, Rfl: 12   cyclobenzaprine  (FLEXERIL ) 5 MG tablet, Take 1 tablet (5 mg total) by mouth 3 (three) times daily as needed for muscle spasms., Disp: 90 tablet, Rfl: 1   EPINEPHrine  0.3  mg/0.3 mL IJ SOAJ injection, Inject 0.3 mg into the muscle as needed for anaphylaxis (severe allergic reaction, then call 911)., Disp: 1 each, Rfl: 0   ferrous sulfate  325 (65 FE) MG tablet, Take 1 tablet (325 mg total) by mouth 2 (two) times daily with a meal., Disp: 180 tablet, Rfl: 3   glucose blood test strip, UAD to check sugar twice daily E11.649 (using accuchek), Disp: 100 each, Rfl: 12   ibuprofen  (ADVIL ) 200 MG tablet, Take 2 tablets (400 mg total) by mouth every 8 (eight) hours as needed for moderate pain., Disp: , Rfl:    Lancet Device MISC, UAD to check sugar twice daily E11.649 (using Accucheck machine), Disp: 100 each, Rfl: 12   montelukast  (SINGULAIR ) 10 MG tablet, Take 1 tablet (10 mg total) by mouth at bedtime., Disp: 90 tablet, Rfl: 3   tirzepatide  (MOUNJARO ) 15 MG/0.5ML Pen, Inject 15 mg into the skin once a week., Disp: 6 mL, Rfl: PRN   traZODone  (DESYREL ) 50 MG tablet, Take 0.5-1 tablets (25-50 mg total) by mouth at bedtime as needed for sleep., Disp: 30 tablet, Rfl: 3   gabapentin  (NEURONTIN ) 300 MG capsule, Take 1 capsule (300 mg total) by mouth at bedtime., Disp: 90 capsule, Rfl: 0   omeprazole  (PRILOSEC) 40 MG capsule, Take 1 capsule (40 mg total) by mouth daily., Disp: 90 capsule, Rfl: 3  ondansetron  (ZOFRAN ) 4 MG tablet, TAKE ONE TABLET EVERY 8 HOURS AS NEEDED FOR NAUSEA AND VOMITING, Disp: 30 tablet, Rfl: 1   sulfamethoxazole -trimethoprim  (BACTRIM  DS) 800-160 MG tablet, Take 1 tablet by mouth 2 (two) times daily., Disp: 20 tablet, Rfl: 1  Current Facility-Administered Medications:    cyanocobalamin  (VITAMIN B12) injection 1,000 mcg, 1,000 mcg, Intramuscular, Q30 days, Marwin Primmer M, DO, 1,000 mcg at 03/06/23 0908 Social History   Socioeconomic History   Marital status: Divorced    Spouse name: Not on file   Number of children: 0   Years of education: Not on file   Highest education level: Associate degree: academic program  Occupational History   Occupation:  Estate agent, Nurse, adult for resource side  Tobacco Use   Smoking status: Never   Smokeless tobacco: Never  Vaping Use   Vaping status: Never Used  Substance and Sexual Activity   Alcohol use: No   Drug use: No   Sexual activity: Not on file  Other Topics Concern   Not on file  Social History Narrative   Not on file   Social Drivers of Health   Financial Resource Strain: Low Risk  (09/18/2023)   Overall Financial Resource Strain (CARDIA)    Difficulty of Paying Living Expenses: Not hard at all  Food Insecurity: No Food Insecurity (09/18/2023)   Hunger Vital Sign    Worried About Running Out of Food in the Last Year: Never true    Ran Out of Food in the Last Year: Never true  Transportation Needs: No Transportation Needs (09/18/2023)   PRAPARE - Administrator, Civil Service (Medical): No    Lack of Transportation (Non-Medical): No  Physical Activity: Insufficiently Active (09/18/2023)   Exercise Vital Sign    Days of Exercise per Week: 5 days    Minutes of Exercise per Session: 10 min  Stress: No Stress Concern Present (09/18/2023)   [employer] of Occupational Health - Occupational Stress Questionnaire    Feeling of Stress: Only a little  Social Connections: Moderately Integrated (09/18/2023)   Social Connection and Isolation Panel    Frequency of Communication with Friends and Family: More than three times a week    Frequency of Social Gatherings with Friends and Family: Three times a week    Attends Religious Services: More than 4 times per year    Active Member of Clubs or Organizations: Yes    Attends Banker Meetings: More than 4 times per year    Marital Status: Divorced  Intimate Partner Violence: Unknown (09/02/2021)   Received from [facility]   HITS    Physically Hurt: Not on file    Insult or Talk Down To: Not on file    Threaten Physical Harm: Not on file    Scream or Curse: Not on file   Family History  Problem Relation Age of  Onset   Heart failure Mother    Hypertension Mother    Heart disease Mother    Cancer Other        mother's side of family, breast cancer   Cancer Other        father's side of family, leukemia   Hypertension Father    Stroke Father 56   Diabetes Other    Hypertension Other    Colon cancer Cousin    Healthy Brother    Breast cancer Maternal Aunt 54   Leukemia Paternal Aunt    Kidney disease Maternal Grandmother    Hypertension  Maternal Grandmother    Cancer Maternal Grandfather    Leukemia Paternal Grandmother    Cancer Paternal Grandfather     Objective: Office vital signs reviewed. BP (!) 152/98   Pulse 73   Temp 98.7 F (37.1 C)   Ht 5' 10 (1.778 m)   Wt (!) 368 lb (166.9 kg)   SpO2 97%   BMI 52.80 kg/m   Physical Examination:  General: Awake, alert, morbidly obese, No acute distress HEENT: LARA white.  Moist mucous membranes Cardio: regular rate and rhythm  Pulm:   normal work of breathing on room air MSK: Gait antalgic.  She has valgus deformation of the knees bilaterally Skin: Zoster rash resolved  Assessment/ Plan: 43 y.o. female   Post herpetic neuralgia - Plan: gabapentin  (NEURONTIN ) 300 MG capsule  Nausea - Plan: omeprazole  (PRILOSEC) 40 MG capsule, ondansetron  (ZOFRAN ) 4 MG tablet  Hidradenitis suppurativa - Plan: sulfamethoxazole -trimethoprim  (BACTRIM  DS) 800-160 MG tablet  Chronic pain of right knee  Gabapentin  nightly.  Rx sent.  Discussed postherpetic neuralgia and handout provided  I have refilled her Zofran , PPI  Bactrim  renewed for at bedtime  Wonder if she is a candidate for viscosupplementation.  Discuss with orthopedics at next visit   Marilyn CHRISTELLA Fielding, DO [facility] [phone]

## 2023-09-19 NOTE — Patient Instructions (Addendum)
 Capsaicin cream can help with daytime symptoms. This can be purchased at the pharmacy OTC  Nerve Pain After Shingles Postherpetic neuralgia (PHN) is nerve pain you may get after you have shingles. Shingles is an infection that causes a painful rash and blisters. It's caused by the same germ that causes chickenpox. PHN affects the spot on your body where you had the shingles rash. It can last for 3 months after your rash has gone away. What are the causes? PHN may be caused by damage to your nerves. This damage may come from swelling from the shingles infection. What increases the risk? You may be more likely to get PHN if: You're older than 44 years of age. You have severe pain before your rash starts. You have a very bad rash. You have shingles in and around your eye. Your body defense system (immune system) is weak. What are the signs or symptoms? The main symptom of PHN is pain. The pain may: Be stabbing, burning, or shooting. Feel like an electric shock. Come and go, or it may be there all the time. Get worse if: Something touches your skin. The temperature goes up or down. You may also have itching. How is this diagnosed? PHN may be diagnosed based on: Your symptoms. Whether you've had shingles before. How is this treated? There's no cure, but treatment can help with the pain. Normal pain medicines may not help. You may need to work with an expert in treating pain to find what works best for you. Treatment may include: Anti-seizure medicines. Antidepressants. Strong pain medicines. A numbing patch worn on the skin. Shots of: Numbing medicines. Medicines to treat inflammation. Botulinum toxin. This can block pain signals and stop you from feeling pain. Follow these instructions at home: Medicines Take over-the-counter and prescription medicines only as told by your health care provider. Ask your provider if the medicine prescribed to you: Requires you to avoid driving or  using machinery. Can cause trouble pooping or constipation. You may need to take these actions to prevent or treat trouble pooping: Drink enough fluid to keep your pee (urine) pale yellow. Take over-the-counter or prescription medicines. Eat foods that are high in fiber, such as beans, whole grains, and fresh fruits and vegetables. Limit foods that are high in fat and processed sugars, such as fried or sweet foods. Managing pain  If told, put ice on the painful area. Put ice in a plastic bag. Place a towel between your skin and the bag. Leave the ice on for 20 minutes, 2-3 times a day. If your skin turns bright red, remove the ice right away to prevent skin damage. The risk of damage is higher if you can't feel pain, heat, or cold. Cover sensitive spots with a bandage, or dressing, to stop clothes from rubbing. Wear loose clothes. General instructions It may take a long time for you to get better. Work closely with your provider. Think about talking with a mental health care provider. They can help you find ways to cope with feeling overwhelmed or hopeless. Have a good support system at home. Think about joining a pain support group. How is this prevented? Vaccines are the best way to prevent shingles and PHN. You should get the vaccine shot for shingles once you're older than 44 years of age. Talk with your provider about getting the shot. Contact a health care provider if: Your medicine isn't helping. You can't manage your pain at home. You feel sad or depressed. Get help  right away if: You have thoughts about hurting yourself or others. Get help right away if you feel like you may hurt yourself or others, or have thoughts about taking your own life. Go to your nearest emergency room or: Call 911. Call the National Suicide Prevention Lifeline at (340)085-1445 or 988. This is open 24 hours a day. Text the Crisis Text Line at 505-396-8400. This information is not intended to replace advice  given to you by your health care provider. Make sure you discuss any questions you have with your health care provider. Document Revised: 05/16/2022 Document Reviewed: 05/16/2022 Elsevier Patient Education  2024 [employer].

## 2023-09-24 ENCOUNTER — Encounter: Payer: Self-pay | Admitting: Family Medicine

## 2023-10-01 ENCOUNTER — Ambulatory Visit: Admitting: Family Medicine

## 2023-10-21 ENCOUNTER — Other Ambulatory Visit: Payer: Self-pay | Admitting: Family Medicine

## 2023-10-21 DIAGNOSIS — E1159 Type 2 diabetes mellitus with other circulatory complications: Secondary | ICD-10-CM

## 2023-11-14 ENCOUNTER — Other Ambulatory Visit: Payer: Self-pay | Admitting: Family Medicine

## 2023-11-14 DIAGNOSIS — R110 Nausea: Secondary | ICD-10-CM

## 2023-11-28 ENCOUNTER — Telehealth: Payer: Self-pay | Admitting: Family Medicine

## 2023-11-28 NOTE — Telephone Encounter (Signed)
 Copied from CRM [id number]. Topic: Appointments - Scheduling Inquiry for Clinic >> Nov 28, 2023  2:15 PM Marilyn Rivera wrote: Reason for CRM: Pt wants to be rescheduled for her physical with PCP, declined next available since it is May 2026   Best contact: [phone]   ----------------------------------------------------------------------- From previous Reason for Contact - Cancel/Reschedule: Patient/patient representative is calling to cancel or reschedule an appointment. Refer to attachments for appointment information.

## 2023-11-28 NOTE — Telephone Encounter (Signed)
 Called patient, scheduled appointment

## 2023-12-01 ENCOUNTER — Encounter: Admitting: Family Medicine

## 2023-12-08 ENCOUNTER — Encounter: Payer: Self-pay | Admitting: Family Medicine

## 2023-12-17 ENCOUNTER — Ambulatory Visit: Admitting: Family Medicine

## 2023-12-17 ENCOUNTER — Encounter: Payer: Self-pay | Admitting: Family Medicine

## 2023-12-17 VITALS — BP 145/87 | HR 66 | Temp 97.5°F | Ht 71.0 in | Wt 371.1 lb

## 2023-12-17 DIAGNOSIS — Z23 Encounter for immunization: Secondary | ICD-10-CM

## 2023-12-17 DIAGNOSIS — D500 Iron deficiency anemia secondary to blood loss (chronic): Secondary | ICD-10-CM

## 2023-12-17 DIAGNOSIS — I152 Hypertension secondary to endocrine disorders: Secondary | ICD-10-CM

## 2023-12-17 DIAGNOSIS — E1159 Type 2 diabetes mellitus with other circulatory complications: Secondary | ICD-10-CM | POA: Diagnosis not present

## 2023-12-17 DIAGNOSIS — Z0001 Encounter for general adult medical examination with abnormal findings: Secondary | ICD-10-CM | POA: Diagnosis not present

## 2023-12-17 DIAGNOSIS — E1169 Type 2 diabetes mellitus with other specified complication: Secondary | ICD-10-CM

## 2023-12-17 DIAGNOSIS — B0229 Other postherpetic nervous system involvement: Secondary | ICD-10-CM

## 2023-12-17 DIAGNOSIS — Z7985 Long-term (current) use of injectable non-insulin antidiabetic drugs: Secondary | ICD-10-CM

## 2023-12-17 DIAGNOSIS — Z0000 Encounter for general adult medical examination without abnormal findings: Secondary | ICD-10-CM

## 2023-12-17 DIAGNOSIS — M25561 Pain in right knee: Secondary | ICD-10-CM

## 2023-12-17 DIAGNOSIS — L732 Hidradenitis suppurativa: Secondary | ICD-10-CM | POA: Diagnosis not present

## 2023-12-17 DIAGNOSIS — N760 Acute vaginitis: Secondary | ICD-10-CM

## 2023-12-17 DIAGNOSIS — E119 Type 2 diabetes mellitus without complications: Secondary | ICD-10-CM

## 2023-12-17 DIAGNOSIS — G8929 Other chronic pain: Secondary | ICD-10-CM

## 2023-12-17 DIAGNOSIS — R110 Nausea: Secondary | ICD-10-CM

## 2023-12-17 LAB — BAYER DCA HB A1C WAIVED: HB A1C (BAYER DCA - WAIVED): 5.3 % (ref 4.8–5.6)

## 2023-12-17 MED ORDER — SULFAMETHOXAZOLE-TRIMETHOPRIM 800-160 MG PO TABS: 1.0000 | ORAL_TABLET | Freq: Two times a day (BID) | ORAL | 1 refills | Status: AC

## 2023-12-17 MED ORDER — CYCLOBENZAPRINE HCL 5 MG PO TABS: 5.0000 mg | ORAL_TABLET | Freq: Three times a day (TID) | ORAL | 1 refills | Status: AC | PRN

## 2023-12-17 MED ORDER — OMEPRAZOLE 40 MG PO CPDR
40.0000 mg | DELAYED_RELEASE_CAPSULE | Freq: Every day | ORAL | 3 refills | Status: AC
Start: 2023-12-17 — End: ?

## 2023-12-17 MED ORDER — GABAPENTIN 300 MG PO CAPS: 300.0000 mg | ORAL_CAPSULE | Freq: Every day | ORAL | 3 refills | Status: AC

## 2023-12-17 MED ORDER — TIRZEPATIDE 15 MG/0.5ML ~~LOC~~ SOAJ: 15.0000 mg | SUBCUTANEOUS | 99 refills | Status: AC

## 2023-12-17 MED ORDER — METRONIDAZOLE 0.75 % VA GEL: 1.0000 | Freq: Every day | VAGINAL | 0 refills | Status: AC

## 2023-12-17 MED ORDER — ONDANSETRON HCL 4 MG PO TABS: ORAL_TABLET | ORAL | Status: DC

## 2023-12-17 MED ORDER — MONTELUKAST SODIUM 10 MG PO TABS: 10.0000 mg | ORAL_TABLET | Freq: Every day | ORAL | 3 refills | Status: AC

## 2023-12-17 MED ORDER — AMLODIPINE BESYLATE 5 MG PO TABS: 5.0000 mg | ORAL_TABLET | Freq: Every day | ORAL | 3 refills | Status: AC

## 2023-12-17 NOTE — Progress Notes (Signed)
 Marilyn Rivera is a 43 y.o. female presents to office today for annual physical exam examination.     Type 2 Diabetes with hypertension, hyperlipidemia:  Patient reports compliance with Mounjaro  but notes typically after she has the injection she gets nauseated and has relied on Zofran  for this.  She reports no changes in bowel habits.  No vomiting.  She continues to lose weight.  However, despite weight loss she continues to have progressive bilateral knee pain and bilateral feet pain, especially with ambulation.  She is under the care of orthopedics for this and they had planned surgery but she is not sure that she can be out of work that long.  Though she admits she is getting to the point she is going to have to seriously consider it.  She has not taken any more gabapentin  after shingles but wonders if it might help with some of the discomfort she is experiencing.  Last eye exam: Has this scheduled Last foot exam: Needs Last A1c:  Lab Results  Component Value Date   HGBA1C 5.6 03/05/2023   Nephropathy screen indicated?:  Needs Last flu, zoster and/or pneumovax:  Immunization History  Administered Date(s) Administered   Influenza, Seasonal, Injecte, Preservative Fre 12/23/2019, 12/07/2020, 11/19/2022   Influenza,inj,Quad PF,6+ Mos 01/18/2019, 11/28/2021   Influenza-Unspecified 12/23/2019, 12/07/2020, 11/28/2021   MMR 04/07/1997   Moderna Covid-19 Fall Seasonal Vaccine 32yrs & older 12/23/2022   Moderna Covid-19 Vaccine Bivalent Booster 50yrs & up 01/09/2021   Moderna Sars-Covid-2 Vaccination 01/18/2020   PFIZER(Purple Top)SARS-COV-2 Vaccination 05/22/2019, 06/15/2019   Tdap 05/24/2020    ROS: No chest pain, shortness of breath.  She did experience some left sided swelling in her leg that was pretty uncomfortable but that is totally resolved now.  Vaginal odor She reports some vaginal odor.  She is not sure if this is related to her hidradenitis suppurativa.  She had a  recent boil near the area where her buttock and vaginal tissue meet.  She notes it was draining.  She would like to repeat Septra .  She is willing to go to dermatology at this point given recurrent nature of this issue despite multiple topical antibiotic and oral antibiotic attempts.  Health Maintenance Due  Topic Date Due   FOOT EXAM  11/29/2022   HEMOGLOBIN A1C  09/02/2023   Diabetic kidney evaluation - Urine ACR  09/10/2023   Mammogram  09/12/2023   Influenza Vaccine  09/26/2023   OPHTHALMOLOGY EXAM  10/11/2023    Immunization History  Administered Date(s) Administered   Influenza, Seasonal, Injecte, Preservative Fre 12/23/2019, 12/07/2020, 11/19/2022   Influenza,inj,Quad PF,6+ Mos 01/18/2019, 11/28/2021   Influenza-Unspecified 12/23/2019, 12/07/2020, 11/28/2021   MMR [birth year]/07/1997   Moderna Covid-19 Fall Seasonal Vaccine 63yrs & older 12/23/2022   Moderna Covid-19 Vaccine Bivalent Booster 81yrs & up 01/09/2021   Moderna Sars-Covid-2 Vaccination 01/18/2020   PFIZER(Purple Top)SARS-COV-2 Vaccination 05/22/2019, 06/15/2019   Tdap 05/24/2020   Past Medical History:  Diagnosis Date   Arthritis    Diverticulitis large intestine w/o perforation or abscess w/o bleeding 10/05/2014   Diverticulitis of colon    Diverticulosis    GERD (gastroesophageal reflux disease)    history of H Pylori   Helicobacter pylori gastritis 09/06/2014   History of kidney stones    Iron deficiency anemia    Kidney stones    Poor venous access    Social History   Socioeconomic History   Marital status: Divorced    Spouse name: Not on file  Number of children: 0   Years of education: Not on file   Highest education level: Associate degree: academic program  Occupational History   Occupation: [employer], Nurse, adult for resource side  Tobacco Use   Smoking status: Never   Smokeless tobacco: Never  Vaping Use   Vaping status: Never Used  Substance and Sexual Activity   Alcohol use: No   Drug use:  No   Sexual activity: Not on file  Other Topics Concern   Not on file  Social History Narrative   Not on file   Social Drivers of Health   Financial Resource Strain: Low Risk  (09/18/2023)   Overall Financial Resource Strain (CARDIA)    Difficulty of Paying Living Expenses: Not hard at all  Food Insecurity: No Food Insecurity (09/18/2023)   Hunger Vital Sign    Worried About Running Out of Food in the Last Year: Never true    Ran Out of Food in the Last Year: Never true  Transportation Needs: No Transportation Needs (09/18/2023)   PRAPARE - Administrator, Civil Service (Medical): No    Lack of Transportation (Non-Medical): No  Physical Activity: Insufficiently Active (09/18/2023)   Exercise Vital Sign    Days of Exercise per Week: 5 days    Minutes of Exercise per Session: 10 min  Stress: No Stress Concern Present (09/18/2023)   [employer] of Occupational Health - Occupational Stress Questionnaire    Feeling of Stress: Only a little  Social Connections: Moderately Integrated (09/18/2023)   Social Connection and Isolation Panel    Frequency of Communication with Friends and Family: More than three times a week    Frequency of Social Gatherings with Friends and Family: Three times a week    Attends Religious Services: More than 4 times per year    Active Member of Clubs or Organizations: Yes    Attends Banker Meetings: More than 4 times per year    Marital Status: Divorced  Intimate Partner Violence: Unknown (09/02/2021)   Received from [facility]   HITS    Physically Hurt: Not on file    Insult or Talk Down To: Not on file    Threaten Physical Harm: Not on file    Scream or Curse: Not on file   Past Surgical History:  Procedure Laterality Date   CENTRAL VENOUS CATHETER INSERTION Right 06/02/2017   Procedure: INSERTION CENTRAL LINE RIGHT INTERNAL JUGULAR;  Surgeon: Kallie Manuelita BROCKS, MD;  Location: [facility];  Service: General;  Laterality: Right;    COLONOSCOPY N/A 06/10/2014   Dr. Harvey; redundant sigmoid colon, moderate diverticulosis in the sigmoid and descending colon. small internal hemorrhoids. Next screening at age 32.    ESOPHAGOGASTRODUODENOSCOPY N/A 06/10/2014   Dr. Harvey: H.pylori gastritis s/p treatment with Amoxicillin  and Biaxin    KNEE SURGERY Right    PARTIAL COLECTOMY N/A 06/02/2017   Procedure: PARTIAL SIGMOID COLECTOMY (OPEN);  Surgeon: Kallie Manuelita BROCKS, MD;  Location: [facility];  Service: General;  Laterality: N/A;   renal calculi removal Left 2010   SKIN LESION EXCISION     over right eyebrow due to wax being left above eye and it seeped down into pore   Family History  Problem Relation Age of Onset   Heart failure Mother    Hypertension Mother    Heart disease Mother    Cancer Other        mother's side of family, breast cancer   Cancer Other  father's side of family, leukemia   Hypertension Father    Stroke Father 52   Diabetes Other    Hypertension Other    Colon cancer Cousin    Healthy Brother    Breast cancer Maternal Aunt 53   Leukemia Paternal Aunt    Kidney disease Maternal Grandmother    Hypertension Maternal Grandmother    Cancer Maternal Grandfather    Leukemia Paternal Grandmother    Cancer Paternal Grandfather     Current Outpatient Medications:    amLODipine  (NORVASC ) 5 MG tablet, TAKE ONE TABLET BY MOUTH DAILY, Disp: 90 tablet, Rfl: 0   azelastine  (ASTELIN ) 0.1 % nasal spray, Place 1 spray into both nostrils 2 (two) times daily., Disp: 30 mL, Rfl: 12   cyclobenzaprine  (FLEXERIL ) 5 MG tablet, Take 1 tablet (5 mg total) by mouth 3 (three) times daily as needed for muscle spasms., Disp: 90 tablet, Rfl: 1   EPINEPHrine  0.3 mg/0.3 mL IJ SOAJ injection, Inject 0.3 mg into the muscle as needed for anaphylaxis (severe allergic reaction, then call 911)., Disp: 1 each, Rfl: 0   ferrous sulfate  325 (65 FE) MG tablet, Take 1 tablet (325 mg total) by mouth 2 (two) times daily with a meal.,  Disp: 180 tablet, Rfl: 3   gabapentin  (NEURONTIN ) 300 MG capsule, Take 1 capsule (300 mg total) by mouth at bedtime., Disp: 90 capsule, Rfl: 0   glucose blood test strip, UAD to check sugar twice daily E11.649 (using accuchek), Disp: 100 each, Rfl: 12   ibuprofen  (ADVIL ) 200 MG tablet, Take 2 tablets (400 mg total) by mouth every 8 (eight) hours as needed for moderate pain., Disp: , Rfl:    Lancet Device MISC, UAD to check sugar twice daily E11.649 (using Accucheck machine), Disp: 100 each, Rfl: 12   montelukast  (SINGULAIR ) 10 MG tablet, Take 1 tablet (10 mg total) by mouth at bedtime., Disp: 90 tablet, Rfl: 3   omeprazole  (PRILOSEC) 40 MG capsule, Take 1 capsule (40 mg total) by mouth daily., Disp: 90 capsule, Rfl: 3   ondansetron  (ZOFRAN ) 4 MG tablet, TAKE ONE TABLET EVERY 8 HOURS AS NEEDED FOR NAUSEA AND VOMITING. LAST FILL, must see GI for further fills., Disp: 30 tablet, Rfl: 01   sulfamethoxazole -trimethoprim  (BACTRIM  DS) 800-160 MG tablet, Take 1 tablet by mouth 2 (two) times daily., Disp: 20 tablet, Rfl: 1   tirzepatide  (MOUNJARO ) 15 MG/0.5ML Pen, Inject 15 mg into the skin once a week., Disp: 6 mL, Rfl: PRN   traZODone  (DESYREL ) 50 MG tablet, Take 0.5-1 tablets (25-50 mg total) by mouth at bedtime as needed for sleep., Disp: 30 tablet, Rfl: 3  Current Facility-Administered Medications:    cyanocobalamin  (VITAMIN B12) injection 1,000 mcg, 1,000 mcg, Intramuscular, Q30 days, Timathy Newberry M, DO, 1,000 mcg at 03/06/23 0908  Allergies  Allergen Reactions   Keflex  [Cephalexin ]     Yeast infections    Coconut Fatty Acid Itching and Rash     ROS: Review of Systems Pertinent items noted in HPI and remainder of comprehensive ROS otherwise negative.    Physical exam BP (!) 144/89   Pulse 66   Temp (!) 97.5 F (36.4 C)   Ht 5' 11 (1.803 m)   Wt (!) 371 lb 2 oz (168.3 kg)   SpO2 96%   BMI 51.76 kg/m  General appearance: alert, cooperative, appears stated age, and morbidly  obese Head: Normocephalic, without obvious abnormality, atraumatic Eyes: negative findings: lids and lashes normal, conjunctivae and sclerae normal, corneas clear, and  pupils equal, round, reactive to light and accomodation Ears: normal TM's and external ear canals both ears Nose: Nares normal. Septum midline. Mucosa normal. No drainage or sinus tenderness. Throat: lips, mucosa, and tongue normal; teeth and gums normal Neck: no adenopathy, no carotid bruit, supple, symmetrical, trachea midline, and thyroid not enlarged, symmetric, no tenderness/mass/nodules Back: symmetric, no curvature. ROM normal. No CVA tenderness. Lungs: clear to auscultation bilaterally Heart: regular rate and rhythm, S1, S2 normal, no murmur, click, rub or gallop Abdomen: soft, non-tender; bowel sounds normal; no masses,  no organomegaly Extremities: No gross soft tissue swelling.  Calf sizes are symmetric.  No palpable cords or tenderness palpation to the calf. Pulses: 2+ and symmetric Skin: Multiple areas of postinflammatory hyperpigmentation where she has had previous boils Lymph nodes: Anterior cervical lymph nodes and supraclavicular lymph nodes without enlargement Neurologic: Alert and oriented X 3, normal strength and tone. Normal symmetric reflexes. Normal coordination and gait MSK: Valgus deformity of the knees appreciated  Diabetic Foot Exam - Simple   Simple Foot Form Diabetic Foot exam was performed with the following findings: Yes 12/17/2023  4:01 PM  Visual Inspection No deformities, no ulcerations, no other skin breakdown bilaterally: Yes Sensation Testing Intact to touch and monofilament testing bilaterally: Yes Pulse Check Posterior Tibialis and Dorsalis pulse intact bilaterally: Yes Comments         09/19/2023   10:08 AM 05/23/2023    8:45 AM 12/10/2022   12:14 PM  Depression screen PHQ 2/9  Decreased Interest 0 0 0  Down, Depressed, Hopeless 0 0 0  PHQ - 2 Score 0 0 0  Altered sleeping  0 0 0  Tired, decreased energy 0 0 0  Change in appetite 0 0 0  Feeling bad or failure about yourself  0 0 0  Trouble concentrating 0 0 0  Moving slowly or fidgety/restless 0 0 0  Suicidal thoughts 0 0 0  PHQ-9 Score 0 0 0  Difficult doing work/chores Not difficult at all Not difficult at all Not difficult at all      09/19/2023   10:08 AM 05/23/2023    8:45 AM 03/05/2023   11:03 AM 12/10/2022   12:15 PM  GAD 7 : Generalized Anxiety Score  Nervous, Anxious, on Edge 0 0 0 0  Control/stop worrying 0 0 0 0  Worry too much - different things 0 0 0 0  Trouble relaxing 0 0 0 0  Restless 0 0 0 0  Easily annoyed or irritable 0 0 0 0  Afraid - awful might happen 0 0 0 0  Total GAD 7 Score 0 0 0 0  Anxiety Difficulty Not difficult at all Not difficult at all Not difficult at all Not difficult at all    No results found for this or any previous visit (from the past 2160 hours).   Assessment/ Plan: Nat Richerd Clause here for annual physical exam.   Annual physical exam  Vaginosis - Plan: metroNIDAZOLE  (METROGEL ) 0.75 % vaginal gel  Hidradenitis suppurativa - Plan: Ambulatory referral to Dermatology, sulfamethoxazole -trimethoprim  (BACTRIM  DS) 800-160 MG tablet  Diabetes mellitus treated with injections of non-insulin  medication (HCC) - Plan: CMP14+EGFR, Bayer DCA Hb A1c Waived, Microalbumin / creatinine urine ratio, tirzepatide  (MOUNJARO ) 15 MG/0.5ML Pen  Nausea - Plan: omeprazole  (PRILOSEC) 40 MG capsule, ondansetron  (ZOFRAN ) 4 MG tablet  Hypertension associated with diabetes (HCC) - Plan: CMP14+EGFR, amLODipine  (NORVASC ) 5 MG tablet  Hyperlipidemia associated with type 2 diabetes mellitus (HCC) - Plan: CMP14+EGFR, Lipid Panel,  TSH  Morbid obesity (HCC) - Plan: CMP14+EGFR, VITAMIN D  25 Hydroxy (Vit-D Deficiency, Fractures)  Iron deficiency anemia due to chronic blood loss - Plan: Iron, TIBC and Ferritin Panel, CBC with Differential, CMP14+EGFR  Chronic pain of right knee -  Plan: CMP14+EGFR  Post herpetic neuralgia - Plan: gabapentin  (NEURONTIN ) 300 MG capsule  Encounter for immunization - Plan: Flu vaccine trivalent PF, 6mos and older(Flulaval,Afluria,Fluarix,Fluzone )   Influenza vaccination administered she will schedule diabetic eye exam.  Diabetic foot exam performed today.  Urine microalbumin sent with patient.  She will set up mammogram  I have given her a prescription for MetroGel  to use for what I suspect is bacterial vaginosis based on the description of the discharge  I have also placed a referral to dermatology for consideration of immunologic's for hidradenitis suppurativa that have been refractory to clindamycin  topically, oral Bactrim  and doxycycline .  She has lost weight and improved blood sugar levels so I think she may have maximized what she is able to do from the primary care/patient centered standpoint.  She will continue all medications as prescribed.  Check iron levels and CBC given history of iron deficiency anemia  Gabapentin  renewed to see if this might help with pain  Counseled on healthy lifestyle choices, including diet (rich in fruits, vegetables and lean meats and low in salt and simple carbohydrates) and exercise (at least 30 minutes of moderate physical activity daily).  Patient to follow up 32m for BP/ DM  Carra Brindley M. Jolinda, DO

## 2023-12-17 NOTE — Patient Instructions (Addendum)
 Blood pressure NOT at goal. Make sure you are AVOIDING salt. Monitor BP. Goal <140/90. Dermatology to call you re: boils appt. Make sure they do your Diabetic eye exam at your eye visit.  How to Take Your Blood Pressure Blood pressure measures how strongly your blood is pressing against the walls of your arteries. Arteries are blood vessels that carry blood from your heart throughout your body. You can take your blood pressure at home with a machine. You may need to check your blood pressure at home: To check if you have high blood pressure (hypertension). To check your blood pressure over time. To make sure your blood pressure medicine is working. Supplies needed: Blood pressure machine, or monitor. A chair to sit in. This should be a chair where you can sit upright with your back supported. Do not sit on a soft couch or an armchair. Table or desk. Small notebook. Pencil or pen. How to prepare Avoid these things for 30 minutes before checking your blood pressure: Having drinks with caffeine in them, such as coffee or tea. Drinking alcohol. Eating. Smoking. Exercising. Do these things five minutes before checking your blood pressure: Go to the bathroom and pee (urinate). Sit in a chair. Be quiet. Do not talk. How to take your blood pressure Follow the instructions that came with your machine. If you have a digital blood pressure monitor, these may be the instructions: Sit up straight. Place your feet on the floor. Do not cross your ankles or legs. Rest your left arm at the level of your heart. You may rest it on a table, desk, or chair. Pull up your shirt sleeve. Wrap the blood pressure cuff around the upper part of your left arm. The cuff should be 1 inch (2.5 cm) above your elbow. It is best to wrap the cuff around bare skin. Fit the cuff snugly around your arm, but not too tightly. You should be able to place only one finger between the cuff and your arm. Place the cord so that  it rests in the bend of your elbow. Press the power button. Sit quietly while the cuff fills with air and loses air. Write down the numbers on the screen. Wait 2-3 minutes and then repeat steps 1-10. What do the numbers mean? Two numbers make up your blood pressure. The first number is called systolic pressure. The second is called diastolic pressure. An example of a blood pressure reading is 120 over 80 (or 120/80). If you are an adult and do not have a medical condition, use this guide to find out if your blood pressure is normal: Normal First number: below 120. Second number: below 80. Elevated First number: 120-129. Second number: below 80. Hypertension stage 1 First number: 130-139. Second number: 80-89. Hypertension stage 2 First number: 140 or above. Second number: 90 or above. Your blood pressure is above normal even if only the first or only the second number is above normal. Follow these instructions at home: Medicines Take over-the-counter and prescription medicines only as told by your doctor. Tell your doctor if your medicine is causing side effects. General instructions Check your blood pressure as often as your doctor tells you to. Check your blood pressure at the same time every day. Take your monitor to your next doctor's appointment. Your doctor will: Make sure you are using it correctly. Make sure it is working right. Understand what your blood pressure numbers should be. Keep all follow-up visits. General tips You will need a  blood pressure machine or monitor. Your doctor can suggest a monitor. You can buy one at a drugstore or online. When choosing one: Choose one with an arm cuff. Choose one that wraps around your upper arm. Only one finger should fit between your arm and the cuff. Do not choose one that measures your blood pressure from your wrist or finger. Where to find more information American Heart Association: www.heart.org Contact a doctor  if: Your blood pressure keeps being high. Your blood pressure is suddenly low. Get help right away if: Your first blood pressure number is higher than 180. Your second blood pressure number is higher than 120. These symptoms may be an emergency. Do not wait to see if the symptoms will go away. Get help right away. Call 911. Summary Check your blood pressure at the same time every day. Avoid caffeine, alcohol, smoking, and exercise for 30 minutes before checking your blood pressure. Make sure you understand what your blood pressure numbers should be. This information is not intended to replace advice given to you by your health care provider. Make sure you discuss any questions you have with your health care provider. Document Revised: 10/26/2020 Document Reviewed: 10/26/2020 Elsevier Patient Education  2024 [employer].

## 2023-12-18 ENCOUNTER — Encounter: Payer: Self-pay | Admitting: Family Medicine

## 2023-12-18 LAB — LIPID PANEL
Chol/HDL Ratio: 4.1 ratio (ref 0.0–4.4)
Cholesterol, Total: 147 mg/dL (ref 100–199)
HDL: 36 mg/dL — ABNORMAL LOW (ref >39)
LDL Chol Calc (NIH): 89 mg/dL (ref 0–99)
Triglycerides: 124 mg/dL (ref 0–149)
VLDL Cholesterol Cal: 22 mg/dL (ref 5–40)

## 2023-12-18 LAB — CMP14+EGFR
ALT: 16 IU/L (ref 0–32)
AST: 14 IU/L (ref 0–40)
Albumin: 3.7 g/dL — ABNORMAL LOW (ref 3.9–4.9)
Alkaline Phosphatase: 77 IU/L (ref 41–116)
BUN/Creatinine Ratio: 11 (ref 9–23)
BUN: 11 mg/dL (ref 6–24)
Bilirubin Total: 0.2 mg/dL (ref 0.0–1.2)
CO2: 21 mmol/L (ref 20–29)
Calcium: 9.3 mg/dL (ref 8.7–10.2)
Chloride: 107 mmol/L — ABNORMAL HIGH (ref 96–106)
Creatinine, Ser: 0.97 mg/dL (ref 0.57–1.00)
Globulin, Total: 3.2 g/dL (ref 1.5–4.5)
Glucose: 75 mg/dL (ref 70–99)
Potassium: 4.3 mmol/L (ref 3.5–5.2)
Sodium: 142 mmol/L (ref 134–144)
Total Protein: 6.9 g/dL (ref 6.0–8.5)
eGFR: 74 mL/min/1.73 (ref >59)

## 2023-12-18 LAB — CBC WITH DIFFERENTIAL/PLATELET
Basophils Absolute: 0.1 x10E3/uL (ref 0.0–0.2)
Basos: 1 %
EOS (ABSOLUTE): 0.1 x10E3/uL (ref 0.0–0.4)
Eos: 2 %
Hematocrit: 46.4 % (ref 34.0–46.6)
Hemoglobin: 14.8 g/dL (ref 11.1–15.9)
Immature Grans (Abs): 0 x10E3/uL (ref 0.0–0.1)
Immature Granulocytes: 0 %
Lymphocytes Absolute: 2.8 x10E3/uL (ref 0.7–3.1)
Lymphs: 45 %
MCH: 30.4 pg (ref 26.6–33.0)
MCHC: 31.9 g/dL (ref 31.5–35.7)
MCV: 95 fL (ref 79–97)
Monocytes Absolute: 0.7 x10E3/uL (ref 0.1–0.9)
Monocytes: 10 %
Neutrophils Absolute: 2.7 x10E3/uL (ref 1.4–7.0)
Neutrophils: 42 %
Platelets: 216 x10E3/uL (ref 150–450)
RBC: 4.87 x10E6/uL (ref 3.77–5.28)
RDW: 14.2 % (ref 11.7–15.4)
WBC: 6.4 x10E3/uL (ref 3.4–10.8)

## 2023-12-18 LAB — IRON,TIBC AND FERRITIN PANEL
Ferritin: 44 ng/mL (ref 15–150)
Iron Saturation: 22 % (ref 15–55)
Iron: 64 ug/dL (ref 27–159)
Total Iron Binding Capacity: 286 ug/dL (ref 250–450)
UIBC: 222 ug/dL (ref 131–425)

## 2023-12-18 LAB — TSH: TSH: 2.2 u[IU]/mL (ref 0.450–4.500)

## 2023-12-18 LAB — MICROALBUMIN / CREATININE URINE RATIO
Creatinine, Urine: 183.5 mg/dL
Microalb/Creat Ratio: 8 mg/g{creat} (ref 0–29)
Microalbumin, Urine: 13.9 ug/mL

## 2023-12-18 LAB — VITAMIN D 25 HYDROXY (VIT D DEFICIENCY, FRACTURES): Vit D, 25-Hydroxy: 17.4 ng/mL — ABNORMAL LOW (ref 30.0–100.0)

## 2023-12-19 ENCOUNTER — Ambulatory Visit: Payer: Self-pay | Admitting: Family Medicine

## 2023-12-19 DIAGNOSIS — E559 Vitamin D deficiency, unspecified: Secondary | ICD-10-CM

## 2023-12-19 MED ORDER — VITAMIN D (ERGOCALCIFEROL) 1.25 MG (50000 UNIT) PO CAPS: 50000.0000 [IU] | ORAL_CAPSULE | ORAL | 3 refills | Status: AC

## 2023-12-29 ENCOUNTER — Encounter: Payer: Self-pay | Admitting: Radiology

## 2024-01-10 ENCOUNTER — Other Ambulatory Visit: Payer: Self-pay | Admitting: *Deleted

## 2024-01-10 DIAGNOSIS — G8929 Other chronic pain: Secondary | ICD-10-CM

## 2024-01-12 ENCOUNTER — Ambulatory Visit (INDEPENDENT_AMBULATORY_CARE_PROVIDER_SITE_OTHER): Admitting: Orthopedic Surgery

## 2024-01-12 DIAGNOSIS — M170 Bilateral primary osteoarthritis of knee: Secondary | ICD-10-CM

## 2024-01-12 DIAGNOSIS — M1712 Unilateral primary osteoarthritis, left knee: Secondary | ICD-10-CM

## 2024-01-12 DIAGNOSIS — M1711 Unilateral primary osteoarthritis, right knee: Secondary | ICD-10-CM

## 2024-01-12 MED ORDER — METHYLPREDNISOLONE 4 MG PO TBPK: ORAL_TABLET | ORAL | 0 refills | Status: DC

## 2024-01-12 NOTE — Telephone Encounter (Signed)
 Please offer her a visit to be seen for urinary issues. Does she want referral to orthopedics for numbness?

## 2024-01-12 NOTE — Progress Notes (Signed)
 Orthopedic Office Note  Patient comes in today for bilateral knee pain.  She has had this pain before.  She has previously been getting injections and wants to do repeat injections.  She also is complaining of left lateral leg and dorsal foot pain.  This has been going on for 3 weeks.  There is no trauma or injury that preceded the onset of the pain.  She describes the pain as a burning pain.  She does not have any pain on the contralateral side.  No back pain.  She has not tried any treatment so far for this.  On exam, she has pain at the extremes of range of motion at the knee.  Negative Lachman, negative posterior drawer.  Knee is stable to varus and valgus stress.  Positive Tinel's at the knee.  EHL/TA/GSC intact. SILT in s/s/dp/sp/t nerve distributions. Foot warm and well perfused. This seems like peroneal nerve issue. Prescribed a medrol  dose pak. If this does not get better could work up further with EMG/NCS and/or knee MRI.   Bilateral knee injection note: After discussing the risk, benefits, and alternatives of bilateral intra-articular knee injections, patient elected to proceed.  The patient was in the seated position with the knees at 90 degrees.  The anterior lateral soft spot over the left knee was prepped with an alcohol-based prep.  Ethyl chloride was used to anesthetize the skin.  A 20-gauge needle was used to inject 1 cc of lidocaine , 1 cc of bupivacaine , 1 cc of Depo-Medrol  into the intra-articular space under standard sterile technique.  Needle was withdrawn and Band-Aid was applied.  The same procedure was then repeated on the right knee.  Patient tolerated the procedure well.    Ozell DELENA Ada, MD Orthopedic Surgeon

## 2024-01-13 ENCOUNTER — Other Ambulatory Visit: Payer: Self-pay | Admitting: *Deleted

## 2024-01-13 DIAGNOSIS — G8929 Other chronic pain: Secondary | ICD-10-CM

## 2024-01-13 NOTE — Telephone Encounter (Signed)
 Patient does not want to schedule appointment for urinary issues. Appointment scheduled for numbness per patient request.

## 2024-01-14 ENCOUNTER — Encounter: Payer: Self-pay | Admitting: Family Medicine

## 2024-01-14 ENCOUNTER — Telehealth (INDEPENDENT_AMBULATORY_CARE_PROVIDER_SITE_OTHER): Admitting: Family Medicine

## 2024-01-14 DIAGNOSIS — N3281 Overactive bladder: Secondary | ICD-10-CM | POA: Diagnosis not present

## 2024-01-14 DIAGNOSIS — R339 Retention of urine, unspecified: Secondary | ICD-10-CM

## 2024-01-14 DIAGNOSIS — Z8052 Family history of malignant neoplasm of bladder: Secondary | ICD-10-CM | POA: Diagnosis not present

## 2024-01-14 DIAGNOSIS — R350 Frequency of micturition: Secondary | ICD-10-CM | POA: Diagnosis not present

## 2024-01-14 DIAGNOSIS — G5732 Lesion of lateral popliteal nerve, left lower limb: Secondary | ICD-10-CM

## 2024-01-14 MED ORDER — OXYBUTYNIN CHLORIDE ER 5 MG PO TB24: 5.0000 mg | ORAL_TABLET | Freq: Every day | ORAL | 0 refills | Status: AC

## 2024-01-14 NOTE — Telephone Encounter (Signed)
 pharmacy: PT KNOWS THE REFILL REQUEST WAS DENIED. SHE STATES SHE HAS A VITUAL APPOINTMENT TOMORROW (01-14-2024) &  ASKED ME TO SEND THE REQUEST AGAIN. THANKS!  My Denied Reason: Refused 2 days ago(01/12/2024):  Refill not appropriate (DCd 11/19/22 at hospital discharge)  OV Today Video at 445 notes: numbess Please advise

## 2024-01-14 NOTE — Progress Notes (Signed)
 MyChart Video visit  Subjective: CC: bladder/ numbness PCP: Jolinda Norene HERO, DO YEP:Wprnoz Marilyn Rivera is a 43 y.o. female. Patient provides verbal consent for consult held via video.  Due to COVID-19 pandemic this visit was conducted virtually. This visit type was conducted due to national recommendations for restrictions regarding the COVID-19 Pandemic (e.g. social distancing, sheltering in place) in an effort to limit this patient's exposure and mitigate transmission in our community. All issues noted in this document were discussed and addressed.  A physical exam was not performed with this format.   Location of patient: work Location of provider: [facility] Others present for call: none  1.  Numbness She reports numbness in her left foot. The pain/ numbness goes down her foot.  She saw her orthopedist and got shots in her knees. Before the injections she was having a lot of swelling and pain but she notes that the shots made that bareable.   2.  Urinary leakage Patient reports that she has also been having issues with urinary frequency and leakage.  She specifically reports that she will urinate before her shower and then as soon she gets in the shower starts having to pee again and it starts leaking.  Denies any dysuria, hematuria, flank pain, nausea, vomiting or abdominal pain.  There is a family history of bladder cancer in one of her relatives.  ROS: Per HPI  Allergies  Allergen Reactions   Keflex  [Cephalexin ]     Yeast infections    Coconut Fatty Acid Itching and Rash   Past Medical History:  Diagnosis Date   Arthritis    Diverticulitis large intestine w/o perforation or abscess w/o bleeding 10/05/2014   Diverticulitis of colon    Diverticulosis    GERD (gastroesophageal reflux disease)    history of H Pylori   Helicobacter pylori gastritis 09/06/2014   History of kidney stones    Iron deficiency anemia    Kidney stones    Poor venous access     Current Outpatient  Medications:    amLODipine  (NORVASC ) 5 MG tablet, Take 1 tablet (5 mg total) by mouth daily., Disp: 90 tablet, Rfl: 3   azelastine  (ASTELIN ) 0.1 % nasal spray, Place 1 spray into both nostrils 2 (two) times daily., Disp: 30 mL, Rfl: 12   cyclobenzaprine  (FLEXERIL ) 5 MG tablet, Take 1 tablet (5 mg total) by mouth 3 (three) times daily as needed for muscle spasms., Disp: 90 tablet, Rfl: 1   diclofenac  (VOLTAREN ) 75 MG EC tablet, TAKE ONE TABLET BY MOUTH TWICE DAILY, Disp: 180 tablet, Rfl: 3   EPINEPHrine  0.3 mg/0.3 mL IJ SOAJ injection, Inject 0.3 mg into the muscle as needed for anaphylaxis (severe allergic reaction, then call 911)., Disp: 1 each, Rfl: 0   ferrous sulfate  325 (65 FE) MG tablet, Take 1 tablet (325 mg total) by mouth 2 (two) times daily with a meal., Disp: 180 tablet, Rfl: 3   gabapentin  (NEURONTIN ) 300 MG capsule, Take 1 capsule (300 mg total) by mouth at bedtime., Disp: 90 capsule, Rfl: 3   glucose blood test strip, UAD to check sugar twice daily E11.649 (using accuchek), Disp: 100 each, Rfl: 12   ibuprofen  (ADVIL ) 200 MG tablet, Take 2 tablets (400 mg total) by mouth every 8 (eight) hours as needed for moderate pain., Disp: , Rfl:    Lancet Device MISC, UAD to check sugar twice daily E11.649 (using Accucheck machine), Disp: 100 each, Rfl: 12   methylPREDNISolone  (MEDROL  DOSEPAK) 4 MG TBPK tablet, Take  as prescribed on the box, Disp: 21 tablet, Rfl: 0   metroNIDAZOLE  (METROGEL ) 0.75 % vaginal gel, Place 1 Applicatorful vaginally at bedtime., Disp: 70 g, Rfl: 0   montelukast  (SINGULAIR ) 10 MG tablet, Take 1 tablet (10 mg total) by mouth at bedtime., Disp: 90 tablet, Rfl: 3   omeprazole  (PRILOSEC) 40 MG capsule, Take 1 capsule (40 mg total) by mouth daily., Disp: 90 capsule, Rfl: 3   ondansetron  (ZOFRAN ) 4 MG tablet, TAKE ONE TABLET EVERY 8 HOURS AS NEEDED FOR NAUSEA AND VOMITING. LAST FILL, must see GI for further fills., Disp: 30 tablet, Rfl: 01   sulfamethoxazole -trimethoprim   (BACTRIM  DS) 800-160 MG tablet, Take 1 tablet by mouth 2 (two) times daily., Disp: 20 tablet, Rfl: 1   tirzepatide  (MOUNJARO ) 15 MG/0.5ML Pen, Inject 15 mg into the skin once a week., Disp: 6 mL, Rfl: PRN   traZODone  (DESYREL ) 50 MG tablet, Take 0.5-1 tablets (25-50 mg total) by mouth at bedtime as needed for sleep., Disp: 30 tablet, Rfl: 3   Vitamin D , Ergocalciferol , (DRISDOL ) 1.25 MG (50000 UNIT) CAPS capsule, Take 1 capsule (50,000 Units total) by mouth every 7 (seven) days., Disp: 12 capsule, Rfl: 3  Gen: Obese, nontoxic female HEENT: Sclera white.  Moist mucous membranes  Assessment/ Plan: 43 y.o. female   Overactive bladder - Plan: Urinalysis, Routine w reflex microscopic, Ambulatory referral to Urogynecology, oxybutynin  (DITROPAN  XL) 5 MG 24 hr tablet  Urinary frequency - Plan: Urinalysis, Routine w reflex microscopic, Ambulatory referral to Urogynecology, oxybutynin  (DITROPAN  XL) 5 MG 24 hr tablet  Incomplete bladder emptying - Plan: Urinalysis, Routine w reflex microscopic, Ambulatory referral to Urogynecology, oxybutynin  (DITROPAN  XL) 5 MG 24 hr tablet  Family history of bladder cancer - Plan: Urinalysis, Routine w reflex microscopic, Ambulatory referral to Urogynecology  Peroneal mononeuropathy, left   Start time: 12:05pm (waited before disconnecting.) 4:44pm End time: 4:56pm  Total time spent on patient care (including video visit/ documentation): 16 minutes  Naseer Hearn CHRISTELLA Fielding, DO [facility] [phone]

## 2024-02-10 ENCOUNTER — Encounter: Payer: Self-pay | Admitting: Family Medicine

## 2024-02-20 ENCOUNTER — Encounter: Payer: Self-pay | Admitting: Orthopedic Surgery

## 2024-02-20 MED ORDER — METHYLPREDNISOLONE 4 MG PO TBPK: ORAL_TABLET | ORAL | 0 refills | Status: DC

## 2024-02-21 ENCOUNTER — Other Ambulatory Visit: Payer: Self-pay | Admitting: Family Medicine

## 2024-02-21 DIAGNOSIS — R110 Nausea: Secondary | ICD-10-CM

## 2024-02-23 ENCOUNTER — Ambulatory Visit: Admitting: Orthopedic Surgery

## 2024-02-27 ENCOUNTER — Other Ambulatory Visit: Payer: Self-pay | Admitting: Family Medicine

## 2024-02-27 DIAGNOSIS — R110 Nausea: Secondary | ICD-10-CM

## 2024-03-02 ENCOUNTER — Encounter: Payer: Self-pay | Admitting: Family Medicine

## 2024-03-02 ENCOUNTER — Other Ambulatory Visit: Payer: Self-pay | Admitting: Family Medicine

## 2024-03-02 DIAGNOSIS — R110 Nausea: Secondary | ICD-10-CM

## 2024-03-02 DIAGNOSIS — Z1231 Encounter for screening mammogram for malignant neoplasm of breast: Secondary | ICD-10-CM

## 2024-03-02 DIAGNOSIS — Z9889 Other specified postprocedural states: Secondary | ICD-10-CM

## 2024-03-03 MED ORDER — ONDANSETRON HCL 4 MG PO TABS: ORAL_TABLET | ORAL | 0 refills | Status: AC

## 2024-03-08 ENCOUNTER — Ambulatory Visit
Admission: RE | Admit: 2024-03-08 | Discharge: 2024-03-08 | Disposition: A | Source: Ambulatory Visit | Attending: Family Medicine

## 2024-03-08 DIAGNOSIS — Z1231 Encounter for screening mammogram for malignant neoplasm of breast: Secondary | ICD-10-CM

## 2024-04-02 ENCOUNTER — Other Ambulatory Visit: Payer: Self-pay | Admitting: Family Medicine

## 2024-04-02 ENCOUNTER — Other Ambulatory Visit: Payer: Self-pay | Admitting: Orthopedic Surgery

## 2024-04-02 ENCOUNTER — Encounter: Payer: Self-pay | Admitting: Family Medicine

## 2024-04-02 ENCOUNTER — Encounter: Payer: Self-pay | Admitting: Orthopedic Surgery

## 2024-04-02 NOTE — Telephone Encounter (Signed)
 Called patient and she was at work and couldn't talk, States she would call back. Mailed letter.

## 2024-04-02 NOTE — Telephone Encounter (Signed)
 [facility] follow up 4m for BP/ DM  RF sent to pharmacy

## 2024-05-24 ENCOUNTER — Ambulatory Visit: Admitting: Family Medicine

## 2024-12-22 ENCOUNTER — Encounter: Admitting: Family Medicine
